# Patient Record
Sex: Female | Born: 1970
Health system: Southern US, Community
[De-identification: ages and names within clinical notes are randomized; demographics above are authoritative.]

## PROBLEM LIST (undated history)

## (undated) DIAGNOSIS — K219 Gastro-esophageal reflux disease without esophagitis: Secondary | ICD-10-CM

## (undated) DIAGNOSIS — G4730 Sleep apnea, unspecified: Secondary | ICD-10-CM

## (undated) DIAGNOSIS — D519 Vitamin B12 deficiency anemia, unspecified: Secondary | ICD-10-CM

## (undated) DIAGNOSIS — K5010 Crohn's disease of large intestine without complications: Secondary | ICD-10-CM

## (undated) DIAGNOSIS — K5090 Crohn's disease, unspecified, without complications: Secondary | ICD-10-CM

## (undated) DIAGNOSIS — E876 Hypokalemia: Secondary | ICD-10-CM

## (undated) DIAGNOSIS — F329 Major depressive disorder, single episode, unspecified: Secondary | ICD-10-CM

## (undated) DIAGNOSIS — K279 Peptic ulcer, site unspecified, unspecified as acute or chronic, without hemorrhage or perforation: Secondary | ICD-10-CM

## (undated) DIAGNOSIS — T7840XA Allergy, unspecified, initial encounter: Secondary | ICD-10-CM

## (undated) DIAGNOSIS — F319 Bipolar disorder, unspecified: Secondary | ICD-10-CM

## (undated) DIAGNOSIS — Z9889 Other specified postprocedural states: Secondary | ICD-10-CM

## (undated) DIAGNOSIS — F419 Anxiety disorder, unspecified: Secondary | ICD-10-CM

## (undated) DIAGNOSIS — S199XXA Unspecified injury of neck, initial encounter: Secondary | ICD-10-CM

## (undated) DIAGNOSIS — F32A Depression, unspecified: Secondary | ICD-10-CM

## (undated) DIAGNOSIS — M1990 Unspecified osteoarthritis, unspecified site: Secondary | ICD-10-CM

## (undated) DIAGNOSIS — D72829 Elevated white blood cell count, unspecified: Secondary | ICD-10-CM

## (undated) DIAGNOSIS — D509 Iron deficiency anemia, unspecified: Secondary | ICD-10-CM

## (undated) DIAGNOSIS — M545 Low back pain, unspecified: Secondary | ICD-10-CM

## (undated) HISTORY — DX: Gastro-esophageal reflux disease without esophagitis: K21.9

## (undated) HISTORY — PX: BREAST BIOPSY: SHX20

## (undated) HISTORY — DX: Elevated white blood cell count, unspecified: D72.829

## (undated) HISTORY — DX: Vitamin B12 deficiency anemia, unspecified: D51.9

## (undated) HISTORY — DX: Crohn's disease of large intestine without complications: K50.10

## (undated) HISTORY — DX: Hypokalemia: E87.6

## (undated) HISTORY — DX: Unspecified injury of neck, initial encounter: S19.9XXA

## (undated) HISTORY — DX: Peptic ulcer, site unspecified, unspecified as acute or chronic, without hemorrhage or perforation: K27.9

## (undated) HISTORY — DX: Low back pain, unspecified: M54.50

## (undated) HISTORY — DX: Bipolar disorder, unspecified: F31.9

## (undated) HISTORY — DX: Allergy, unspecified, initial encounter: T78.40XA

## (undated) HISTORY — DX: Depression, unspecified: F32.A

## (undated) HISTORY — DX: Low back pain: M54.5

## (undated) HISTORY — DX: Unspecified osteoarthritis, unspecified site: M19.90

## (undated) HISTORY — DX: Anxiety disorder, unspecified: F41.9

## (undated) HISTORY — PX: NECK SURGERY: SHX720

## (undated) HISTORY — DX: Iron deficiency anemia, unspecified: D50.9

## (undated) HISTORY — DX: Crohn's disease, unspecified, without complications: K50.90

## (undated) HISTORY — DX: Major depressive disorder, single episode, unspecified: F32.9

---

## 1994-03-07 HISTORY — PX: HERNIA REPAIR: SHX51

## 1998-03-07 HISTORY — PX: TUBAL LIGATION: SHX77

## 1999-03-08 DIAGNOSIS — K5090 Crohn's disease, unspecified, without complications: Secondary | ICD-10-CM

## 1999-03-08 HISTORY — PX: SMALL INTESTINE SURGERY: SHX150

## 1999-03-08 HISTORY — DX: Crohn's disease, unspecified, without complications: K50.90

## 2000-03-07 HISTORY — PX: CHOLECYSTECTOMY: SHX55

## 2000-06-16 ENCOUNTER — Inpatient Hospital Stay (HOSPITAL_COMMUNITY): Admission: AD | Admit: 2000-06-16 | Discharge: 2000-06-24 | Payer: Self-pay | Admitting: Family Medicine

## 2000-06-16 ENCOUNTER — Encounter: Payer: Self-pay | Admitting: Family Medicine

## 2000-06-22 ENCOUNTER — Encounter (INDEPENDENT_AMBULATORY_CARE_PROVIDER_SITE_OTHER): Payer: Self-pay | Admitting: Internal Medicine

## 2000-06-28 ENCOUNTER — Encounter: Payer: Self-pay | Admitting: *Deleted

## 2000-06-28 ENCOUNTER — Inpatient Hospital Stay (HOSPITAL_COMMUNITY): Admission: EM | Admit: 2000-06-28 | Discharge: 2000-07-06 | Payer: Self-pay | Admitting: *Deleted

## 2001-05-08 ENCOUNTER — Encounter: Payer: Self-pay | Admitting: Internal Medicine

## 2001-05-08 ENCOUNTER — Emergency Department (HOSPITAL_COMMUNITY): Admission: EM | Admit: 2001-05-08 | Discharge: 2001-05-08 | Payer: Self-pay | Admitting: Internal Medicine

## 2001-05-14 ENCOUNTER — Inpatient Hospital Stay (HOSPITAL_COMMUNITY): Admission: EM | Admit: 2001-05-14 | Discharge: 2001-05-18 | Payer: Self-pay | Admitting: Emergency Medicine

## 2001-05-14 ENCOUNTER — Encounter (INDEPENDENT_AMBULATORY_CARE_PROVIDER_SITE_OTHER): Payer: Self-pay | Admitting: Internal Medicine

## 2001-05-15 ENCOUNTER — Encounter (INDEPENDENT_AMBULATORY_CARE_PROVIDER_SITE_OTHER): Payer: Self-pay | Admitting: Internal Medicine

## 2001-07-17 ENCOUNTER — Emergency Department (HOSPITAL_COMMUNITY): Admission: EM | Admit: 2001-07-17 | Discharge: 2001-07-17 | Payer: Self-pay | Admitting: Emergency Medicine

## 2001-09-04 ENCOUNTER — Ambulatory Visit (HOSPITAL_COMMUNITY): Admission: RE | Admit: 2001-09-04 | Discharge: 2001-09-04 | Payer: Self-pay | Admitting: Internal Medicine

## 2001-10-24 ENCOUNTER — Emergency Department (HOSPITAL_COMMUNITY): Admission: EM | Admit: 2001-10-24 | Discharge: 2001-10-24 | Payer: Self-pay | Admitting: Emergency Medicine

## 2002-01-04 ENCOUNTER — Ambulatory Visit (HOSPITAL_COMMUNITY): Admission: RE | Admit: 2002-01-04 | Discharge: 2002-01-04 | Payer: Self-pay | Admitting: Internal Medicine

## 2002-01-04 ENCOUNTER — Encounter: Payer: Self-pay | Admitting: Internal Medicine

## 2002-01-17 ENCOUNTER — Inpatient Hospital Stay (HOSPITAL_COMMUNITY): Admission: EM | Admit: 2002-01-17 | Discharge: 2002-01-23 | Payer: Self-pay | Admitting: Psychiatry

## 2002-01-18 ENCOUNTER — Encounter (HOSPITAL_COMMUNITY): Payer: Self-pay | Admitting: Psychiatry

## 2002-04-30 ENCOUNTER — Emergency Department (HOSPITAL_COMMUNITY): Admission: EM | Admit: 2002-04-30 | Discharge: 2002-05-01 | Payer: Self-pay | Admitting: Emergency Medicine

## 2002-05-15 ENCOUNTER — Emergency Department (HOSPITAL_COMMUNITY): Admission: EM | Admit: 2002-05-15 | Discharge: 2002-05-15 | Payer: Self-pay | Admitting: Emergency Medicine

## 2002-05-22 ENCOUNTER — Emergency Department (HOSPITAL_COMMUNITY): Admission: EM | Admit: 2002-05-22 | Discharge: 2002-05-23 | Payer: Self-pay | Admitting: *Deleted

## 2002-06-27 ENCOUNTER — Encounter (HOSPITAL_COMMUNITY): Admission: RE | Admit: 2002-06-27 | Discharge: 2002-07-27 | Payer: Self-pay | Admitting: Internal Medicine

## 2002-07-11 ENCOUNTER — Encounter: Payer: Self-pay | Admitting: Internal Medicine

## 2002-07-11 ENCOUNTER — Ambulatory Visit (HOSPITAL_COMMUNITY): Admission: RE | Admit: 2002-07-11 | Discharge: 2002-07-11 | Payer: Self-pay | Admitting: Internal Medicine

## 2002-07-30 ENCOUNTER — Encounter: Payer: Self-pay | Admitting: Family Medicine

## 2002-07-30 ENCOUNTER — Ambulatory Visit (HOSPITAL_COMMUNITY): Admission: RE | Admit: 2002-07-30 | Discharge: 2002-07-30 | Payer: Self-pay | Admitting: Family Medicine

## 2002-08-21 ENCOUNTER — Encounter (HOSPITAL_COMMUNITY): Admission: RE | Admit: 2002-08-21 | Discharge: 2002-09-20 | Payer: Self-pay | Admitting: Internal Medicine

## 2002-09-01 ENCOUNTER — Emergency Department (HOSPITAL_COMMUNITY): Admission: EM | Admit: 2002-09-01 | Discharge: 2002-09-02 | Payer: Self-pay | Admitting: *Deleted

## 2002-09-01 ENCOUNTER — Encounter: Payer: Self-pay | Admitting: *Deleted

## 2002-09-11 ENCOUNTER — Ambulatory Visit (HOSPITAL_COMMUNITY): Admission: RE | Admit: 2002-09-11 | Discharge: 2002-09-11 | Payer: Self-pay | Admitting: Internal Medicine

## 2002-09-11 ENCOUNTER — Encounter: Payer: Self-pay | Admitting: Internal Medicine

## 2002-09-20 ENCOUNTER — Ambulatory Visit (HOSPITAL_COMMUNITY): Admission: RE | Admit: 2002-09-20 | Discharge: 2002-09-20 | Payer: Self-pay | Admitting: Internal Medicine

## 2002-09-20 HISTORY — PX: COLONOSCOPY: SHX174

## 2003-01-05 ENCOUNTER — Inpatient Hospital Stay (HOSPITAL_COMMUNITY): Admission: AD | Admit: 2003-01-05 | Discharge: 2003-01-09 | Payer: Self-pay | Admitting: Psychiatry

## 2003-03-08 HISTORY — PX: NECK SURGERY: SHX720

## 2003-06-17 ENCOUNTER — Emergency Department (HOSPITAL_COMMUNITY): Admission: EM | Admit: 2003-06-17 | Discharge: 2003-06-17 | Payer: Self-pay | Admitting: Emergency Medicine

## 2003-08-21 ENCOUNTER — Emergency Department (HOSPITAL_COMMUNITY): Admission: EM | Admit: 2003-08-21 | Discharge: 2003-08-21 | Payer: Self-pay | Admitting: Emergency Medicine

## 2003-09-02 ENCOUNTER — Emergency Department (HOSPITAL_COMMUNITY): Admission: EM | Admit: 2003-09-02 | Discharge: 2003-09-02 | Payer: Self-pay | Admitting: Emergency Medicine

## 2003-09-23 ENCOUNTER — Emergency Department (HOSPITAL_COMMUNITY): Admission: EM | Admit: 2003-09-23 | Discharge: 2003-09-23 | Payer: Self-pay | Admitting: Emergency Medicine

## 2003-10-03 ENCOUNTER — Emergency Department (HOSPITAL_COMMUNITY): Admission: EM | Admit: 2003-10-03 | Discharge: 2003-10-03 | Payer: Self-pay | Admitting: Emergency Medicine

## 2003-10-20 ENCOUNTER — Ambulatory Visit (HOSPITAL_COMMUNITY): Admission: RE | Admit: 2003-10-20 | Discharge: 2003-10-20 | Payer: Self-pay | Admitting: Family Medicine

## 2003-10-31 ENCOUNTER — Inpatient Hospital Stay (HOSPITAL_COMMUNITY): Admission: RE | Admit: 2003-10-31 | Discharge: 2003-11-02 | Payer: Self-pay | Admitting: Neurosurgery

## 2003-11-07 ENCOUNTER — Emergency Department (HOSPITAL_COMMUNITY): Admission: EM | Admit: 2003-11-07 | Discharge: 2003-11-07 | Payer: Self-pay | Admitting: Emergency Medicine

## 2003-11-09 ENCOUNTER — Inpatient Hospital Stay (HOSPITAL_COMMUNITY): Admission: EM | Admit: 2003-11-09 | Discharge: 2003-11-12 | Payer: Self-pay | Admitting: Emergency Medicine

## 2003-11-25 ENCOUNTER — Emergency Department (HOSPITAL_COMMUNITY): Admission: EM | Admit: 2003-11-25 | Discharge: 2003-11-25 | Payer: Self-pay | Admitting: Emergency Medicine

## 2003-12-01 ENCOUNTER — Emergency Department (HOSPITAL_COMMUNITY): Admission: EM | Admit: 2003-12-01 | Discharge: 2003-12-01 | Payer: Self-pay | Admitting: Emergency Medicine

## 2003-12-03 ENCOUNTER — Emergency Department (HOSPITAL_COMMUNITY): Admission: EM | Admit: 2003-12-03 | Discharge: 2003-12-03 | Payer: Self-pay | Admitting: Emergency Medicine

## 2003-12-04 ENCOUNTER — Ambulatory Visit: Payer: Self-pay | Admitting: Psychiatry

## 2003-12-10 ENCOUNTER — Emergency Department (HOSPITAL_COMMUNITY): Admission: EM | Admit: 2003-12-10 | Discharge: 2003-12-10 | Payer: Self-pay | Admitting: Emergency Medicine

## 2003-12-15 ENCOUNTER — Ambulatory Visit: Payer: Self-pay | Admitting: Psychology

## 2003-12-20 ENCOUNTER — Emergency Department (HOSPITAL_COMMUNITY): Admission: EM | Admit: 2003-12-20 | Discharge: 2003-12-20 | Payer: Self-pay | Admitting: Emergency Medicine

## 2004-01-02 ENCOUNTER — Ambulatory Visit (HOSPITAL_COMMUNITY): Admission: RE | Admit: 2004-01-02 | Discharge: 2004-01-02 | Payer: Self-pay | Admitting: Neurosurgery

## 2004-01-04 ENCOUNTER — Emergency Department (HOSPITAL_COMMUNITY): Admission: EM | Admit: 2004-01-04 | Discharge: 2004-01-04 | Payer: Self-pay | Admitting: Emergency Medicine

## 2004-01-15 ENCOUNTER — Ambulatory Visit: Payer: Self-pay | Admitting: Orthopedic Surgery

## 2004-01-26 ENCOUNTER — Encounter (HOSPITAL_COMMUNITY): Admission: RE | Admit: 2004-01-26 | Discharge: 2004-02-25 | Payer: Self-pay | Admitting: Orthopedic Surgery

## 2004-02-05 ENCOUNTER — Ambulatory Visit: Payer: Self-pay | Admitting: Psychiatry

## 2004-03-05 ENCOUNTER — Emergency Department (HOSPITAL_COMMUNITY): Admission: EM | Admit: 2004-03-05 | Discharge: 2004-03-05 | Payer: Self-pay | Admitting: Emergency Medicine

## 2004-03-07 HISTORY — PX: SHOULDER SURGERY: SHX246

## 2004-03-09 ENCOUNTER — Ambulatory Visit: Payer: Self-pay | Admitting: Internal Medicine

## 2004-03-27 ENCOUNTER — Emergency Department (HOSPITAL_COMMUNITY): Admission: EM | Admit: 2004-03-27 | Discharge: 2004-03-27 | Payer: Self-pay | Admitting: Emergency Medicine

## 2004-03-29 ENCOUNTER — Ambulatory Visit: Payer: Self-pay | Admitting: Orthopedic Surgery

## 2004-04-01 ENCOUNTER — Ambulatory Visit: Payer: Self-pay | Admitting: Psychiatry

## 2004-04-02 ENCOUNTER — Ambulatory Visit: Payer: Self-pay | Admitting: Orthopedic Surgery

## 2004-04-02 ENCOUNTER — Ambulatory Visit (HOSPITAL_COMMUNITY): Admission: RE | Admit: 2004-04-02 | Discharge: 2004-04-02 | Payer: Self-pay | Admitting: Orthopedic Surgery

## 2004-04-05 ENCOUNTER — Ambulatory Visit: Payer: Self-pay | Admitting: Orthopedic Surgery

## 2004-04-07 ENCOUNTER — Emergency Department (HOSPITAL_COMMUNITY): Admission: EM | Admit: 2004-04-07 | Discharge: 2004-04-07 | Payer: Self-pay | Admitting: Emergency Medicine

## 2004-04-26 ENCOUNTER — Ambulatory Visit: Payer: Self-pay | Admitting: Orthopedic Surgery

## 2004-06-02 ENCOUNTER — Ambulatory Visit: Payer: Self-pay | Admitting: Psychiatry

## 2004-06-28 ENCOUNTER — Ambulatory Visit: Payer: Self-pay | Admitting: Orthopedic Surgery

## 2004-07-22 ENCOUNTER — Emergency Department (HOSPITAL_COMMUNITY): Admission: EM | Admit: 2004-07-22 | Discharge: 2004-07-22 | Payer: Self-pay | Admitting: Emergency Medicine

## 2004-08-02 ENCOUNTER — Emergency Department (HOSPITAL_COMMUNITY): Admission: EM | Admit: 2004-08-02 | Discharge: 2004-08-02 | Payer: Self-pay | Admitting: Emergency Medicine

## 2004-08-04 ENCOUNTER — Ambulatory Visit: Payer: Self-pay | Admitting: Internal Medicine

## 2004-08-11 ENCOUNTER — Emergency Department (HOSPITAL_COMMUNITY): Admission: EM | Admit: 2004-08-11 | Discharge: 2004-08-12 | Payer: Self-pay | Admitting: *Deleted

## 2004-08-25 ENCOUNTER — Ambulatory Visit: Payer: Self-pay | Admitting: Psychology

## 2004-09-15 ENCOUNTER — Ambulatory Visit: Payer: Self-pay | Admitting: Orthopedic Surgery

## 2004-09-16 ENCOUNTER — Encounter (HOSPITAL_COMMUNITY): Admission: RE | Admit: 2004-09-16 | Discharge: 2004-10-16 | Payer: Self-pay | Admitting: Orthopedic Surgery

## 2004-10-21 ENCOUNTER — Emergency Department (HOSPITAL_COMMUNITY): Admission: EM | Admit: 2004-10-21 | Discharge: 2004-10-21 | Payer: Self-pay | Admitting: Emergency Medicine

## 2004-10-28 ENCOUNTER — Ambulatory Visit: Payer: Self-pay | Admitting: Psychology

## 2004-10-29 ENCOUNTER — Emergency Department (HOSPITAL_COMMUNITY): Admission: EM | Admit: 2004-10-29 | Discharge: 2004-10-30 | Payer: Self-pay | Admitting: *Deleted

## 2005-01-06 ENCOUNTER — Ambulatory Visit: Payer: Self-pay | Admitting: Psychiatry

## 2005-01-31 ENCOUNTER — Emergency Department (HOSPITAL_COMMUNITY): Admission: EM | Admit: 2005-01-31 | Discharge: 2005-01-31 | Payer: Self-pay | Admitting: Emergency Medicine

## 2005-02-03 ENCOUNTER — Ambulatory Visit: Payer: Self-pay | Admitting: Family Medicine

## 2005-02-03 ENCOUNTER — Ambulatory Visit: Payer: Self-pay | Admitting: Internal Medicine

## 2005-02-05 ENCOUNTER — Emergency Department (HOSPITAL_COMMUNITY): Admission: EM | Admit: 2005-02-05 | Discharge: 2005-02-05 | Payer: Self-pay | Admitting: Emergency Medicine

## 2005-02-07 ENCOUNTER — Ambulatory Visit: Payer: Self-pay | Admitting: Family Medicine

## 2005-03-08 ENCOUNTER — Ambulatory Visit: Payer: Self-pay | Admitting: Psychiatry

## 2005-03-28 ENCOUNTER — Emergency Department (HOSPITAL_COMMUNITY): Admission: EM | Admit: 2005-03-28 | Discharge: 2005-03-28 | Payer: Self-pay | Admitting: Emergency Medicine

## 2005-04-18 ENCOUNTER — Emergency Department (HOSPITAL_COMMUNITY): Admission: EM | Admit: 2005-04-18 | Discharge: 2005-04-18 | Payer: Self-pay | Admitting: Emergency Medicine

## 2005-05-01 ENCOUNTER — Emergency Department (HOSPITAL_COMMUNITY): Admission: EM | Admit: 2005-05-01 | Discharge: 2005-05-01 | Payer: Self-pay | Admitting: Emergency Medicine

## 2005-05-05 ENCOUNTER — Ambulatory Visit: Payer: Self-pay | Admitting: Psychiatry

## 2005-05-16 ENCOUNTER — Ambulatory Visit: Payer: Self-pay | Admitting: Family Medicine

## 2005-05-17 ENCOUNTER — Ambulatory Visit: Payer: Self-pay | Admitting: Internal Medicine

## 2005-05-19 ENCOUNTER — Ambulatory Visit (HOSPITAL_COMMUNITY): Payer: Self-pay | Admitting: Psychology

## 2005-05-20 ENCOUNTER — Emergency Department (HOSPITAL_COMMUNITY): Admission: EM | Admit: 2005-05-20 | Discharge: 2005-05-20 | Payer: Self-pay | Admitting: *Deleted

## 2005-05-20 ENCOUNTER — Ambulatory Visit: Payer: Self-pay | Admitting: Internal Medicine

## 2005-05-27 ENCOUNTER — Ambulatory Visit: Payer: Self-pay | Admitting: Internal Medicine

## 2005-05-31 ENCOUNTER — Ambulatory Visit: Payer: Self-pay | Admitting: Gastroenterology

## 2005-06-01 ENCOUNTER — Ambulatory Visit: Payer: Self-pay | Admitting: Gastroenterology

## 2005-06-01 ENCOUNTER — Encounter (INDEPENDENT_AMBULATORY_CARE_PROVIDER_SITE_OTHER): Payer: Self-pay | Admitting: Internal Medicine

## 2005-06-01 ENCOUNTER — Encounter (INDEPENDENT_AMBULATORY_CARE_PROVIDER_SITE_OTHER): Payer: Self-pay | Admitting: Specialist

## 2005-06-01 DIAGNOSIS — Z9889 Other specified postprocedural states: Secondary | ICD-10-CM

## 2005-06-01 HISTORY — DX: Other specified postprocedural states: Z98.890

## 2005-06-03 ENCOUNTER — Ambulatory Visit (HOSPITAL_COMMUNITY): Admission: RE | Admit: 2005-06-03 | Discharge: 2005-06-03 | Payer: Self-pay | Admitting: Gastroenterology

## 2005-06-06 ENCOUNTER — Ambulatory Visit: Payer: Self-pay | Admitting: Internal Medicine

## 2005-06-08 ENCOUNTER — Ambulatory Visit (HOSPITAL_COMMUNITY): Admission: RE | Admit: 2005-06-08 | Discharge: 2005-06-08 | Payer: Self-pay | Admitting: Internal Medicine

## 2005-06-09 ENCOUNTER — Ambulatory Visit: Payer: Self-pay | Admitting: Family Medicine

## 2005-06-09 ENCOUNTER — Ambulatory Visit: Payer: Self-pay | Admitting: Gastroenterology

## 2005-06-09 ENCOUNTER — Encounter (INDEPENDENT_AMBULATORY_CARE_PROVIDER_SITE_OTHER): Payer: Self-pay | Admitting: Internal Medicine

## 2005-06-13 ENCOUNTER — Encounter (INDEPENDENT_AMBULATORY_CARE_PROVIDER_SITE_OTHER): Payer: Self-pay | Admitting: Internal Medicine

## 2005-06-13 ENCOUNTER — Ambulatory Visit: Payer: Self-pay | Admitting: Family Medicine

## 2005-06-16 ENCOUNTER — Ambulatory Visit: Payer: Self-pay | Admitting: Internal Medicine

## 2005-06-18 ENCOUNTER — Ambulatory Visit: Payer: Self-pay | Admitting: Gastroenterology

## 2005-06-18 ENCOUNTER — Observation Stay (HOSPITAL_COMMUNITY): Admission: EM | Admit: 2005-06-18 | Discharge: 2005-06-19 | Payer: Self-pay | Admitting: Emergency Medicine

## 2005-06-23 ENCOUNTER — Ambulatory Visit: Payer: Self-pay | Admitting: Internal Medicine

## 2005-06-30 ENCOUNTER — Ambulatory Visit: Payer: Self-pay | Admitting: Orthopedic Surgery

## 2005-07-05 ENCOUNTER — Ambulatory Visit (HOSPITAL_COMMUNITY): Payer: Self-pay | Admitting: Psychiatry

## 2005-07-20 ENCOUNTER — Ambulatory Visit: Payer: Self-pay | Admitting: Internal Medicine

## 2005-07-27 ENCOUNTER — Ambulatory Visit: Payer: Self-pay | Admitting: Internal Medicine

## 2005-08-06 ENCOUNTER — Emergency Department (HOSPITAL_COMMUNITY): Admission: EM | Admit: 2005-08-06 | Discharge: 2005-08-06 | Payer: Self-pay | Admitting: Emergency Medicine

## 2005-08-09 ENCOUNTER — Ambulatory Visit (HOSPITAL_COMMUNITY): Payer: Self-pay | Admitting: Psychology

## 2005-08-17 ENCOUNTER — Ambulatory Visit: Payer: Self-pay | Admitting: Internal Medicine

## 2005-08-26 ENCOUNTER — Ambulatory Visit: Payer: Self-pay | Admitting: Gastroenterology

## 2005-09-06 ENCOUNTER — Ambulatory Visit: Payer: Self-pay | Admitting: Gastroenterology

## 2005-09-20 ENCOUNTER — Ambulatory Visit (HOSPITAL_COMMUNITY): Payer: Self-pay | Admitting: Psychiatry

## 2005-09-21 ENCOUNTER — Ambulatory Visit: Payer: Self-pay | Admitting: Internal Medicine

## 2005-09-21 LAB — CONVERTED CEMR LAB
ALT: 26 units/L
AST: 19 units/L
Calcium: 9.1 mg/dL
Chloride: 109 meq/L
Creatinine, Ser: 0.79 mg/dL
Sodium: 140 meq/L
Total Protein: 7.3 g/dL

## 2005-10-03 ENCOUNTER — Ambulatory Visit (HOSPITAL_COMMUNITY): Payer: Self-pay | Admitting: Psychology

## 2005-10-05 ENCOUNTER — Ambulatory Visit: Payer: Self-pay | Admitting: Internal Medicine

## 2005-10-06 ENCOUNTER — Ambulatory Visit: Payer: Self-pay | Admitting: Internal Medicine

## 2005-10-20 ENCOUNTER — Ambulatory Visit: Payer: Self-pay | Admitting: Internal Medicine

## 2005-10-20 ENCOUNTER — Ambulatory Visit (HOSPITAL_COMMUNITY): Payer: Self-pay | Admitting: Psychiatry

## 2005-11-03 ENCOUNTER — Ambulatory Visit: Payer: Self-pay | Admitting: Internal Medicine

## 2005-11-10 ENCOUNTER — Ambulatory Visit: Payer: Self-pay | Admitting: Internal Medicine

## 2005-11-17 ENCOUNTER — Ambulatory Visit: Payer: Self-pay | Admitting: Internal Medicine

## 2005-12-01 ENCOUNTER — Ambulatory Visit: Payer: Self-pay | Admitting: Internal Medicine

## 2005-12-20 ENCOUNTER — Ambulatory Visit (HOSPITAL_COMMUNITY): Payer: Self-pay | Admitting: Psychiatry

## 2005-12-29 DIAGNOSIS — Z9889 Other specified postprocedural states: Secondary | ICD-10-CM | POA: Insufficient documentation

## 2005-12-29 DIAGNOSIS — M545 Low back pain, unspecified: Secondary | ICD-10-CM | POA: Insufficient documentation

## 2005-12-29 DIAGNOSIS — F411 Generalized anxiety disorder: Secondary | ICD-10-CM | POA: Insufficient documentation

## 2005-12-29 DIAGNOSIS — M129 Arthropathy, unspecified: Secondary | ICD-10-CM | POA: Insufficient documentation

## 2005-12-29 DIAGNOSIS — J45909 Unspecified asthma, uncomplicated: Secondary | ICD-10-CM | POA: Insufficient documentation

## 2005-12-29 DIAGNOSIS — K279 Peptic ulcer, site unspecified, unspecified as acute or chronic, without hemorrhage or perforation: Secondary | ICD-10-CM | POA: Insufficient documentation

## 2005-12-29 DIAGNOSIS — D518 Other vitamin B12 deficiency anemias: Secondary | ICD-10-CM | POA: Insufficient documentation

## 2005-12-29 DIAGNOSIS — J309 Allergic rhinitis, unspecified: Secondary | ICD-10-CM | POA: Insufficient documentation

## 2005-12-29 DIAGNOSIS — F319 Bipolar disorder, unspecified: Secondary | ICD-10-CM | POA: Insufficient documentation

## 2005-12-29 DIAGNOSIS — K219 Gastro-esophageal reflux disease without esophagitis: Secondary | ICD-10-CM | POA: Insufficient documentation

## 2005-12-29 DIAGNOSIS — F324 Major depressive disorder, single episode, in partial remission: Secondary | ICD-10-CM | POA: Insufficient documentation

## 2006-01-03 ENCOUNTER — Ambulatory Visit: Payer: Self-pay | Admitting: Internal Medicine

## 2006-01-03 LAB — CONVERTED CEMR LAB: Vitamin B-12: 557 pg/mL

## 2006-01-05 ENCOUNTER — Encounter (INDEPENDENT_AMBULATORY_CARE_PROVIDER_SITE_OTHER): Payer: Self-pay | Admitting: Internal Medicine

## 2006-01-12 ENCOUNTER — Ambulatory Visit: Payer: Self-pay | Admitting: Internal Medicine

## 2006-02-14 ENCOUNTER — Ambulatory Visit: Payer: Self-pay | Admitting: Internal Medicine

## 2006-02-15 ENCOUNTER — Ambulatory Visit (HOSPITAL_COMMUNITY): Payer: Self-pay | Admitting: Psychology

## 2006-02-16 ENCOUNTER — Ambulatory Visit (HOSPITAL_COMMUNITY): Payer: Self-pay | Admitting: Psychiatry

## 2006-03-22 ENCOUNTER — Ambulatory Visit: Payer: Self-pay | Admitting: Internal Medicine

## 2006-03-22 LAB — CONVERTED CEMR LAB
ALT: 30 units/L (ref 0–35)
AST: 18 units/L (ref 0–37)
Alkaline Phosphatase: 70 units/L (ref 39–117)
Bilirubin, Direct: 0.1 mg/dL (ref 0.0–0.3)
Eosinophils Relative: 4 % (ref 0–5)
HCT: 41.8 % (ref 36.0–46.0)
Hemoglobin: 13.7 g/dL (ref 12.0–15.0)
Lymphocytes Relative: 36 % (ref 12–46)
Lymphs Abs: 4.4 10*3/uL — ABNORMAL HIGH (ref 0.7–3.3)
Neutro Abs: 6.7 10*3/uL (ref 1.7–7.7)
Platelets: 288 10*3/uL (ref 150–400)
RDW: 15 % — ABNORMAL HIGH (ref 11.5–14.0)
Total Bilirubin: 0.3 mg/dL (ref 0.3–1.2)
WBC: 12.2 10*3/uL — ABNORMAL HIGH (ref 4.0–10.5)

## 2006-03-29 ENCOUNTER — Ambulatory Visit: Payer: Self-pay | Admitting: Internal Medicine

## 2006-03-30 ENCOUNTER — Encounter (INDEPENDENT_AMBULATORY_CARE_PROVIDER_SITE_OTHER): Payer: Self-pay | Admitting: Internal Medicine

## 2006-03-30 LAB — CONVERTED CEMR LAB
Basophils Absolute: 0.1 10*3/uL (ref 0.0–0.1)
Basophils Relative: 0 % (ref 0–1)
Calcium: 8.7 mg/dL (ref 8.4–10.5)
Eosinophils Absolute: 0.5 10*3/uL (ref 0.0–0.7)
Eosinophils Relative: 4 % (ref 0–5)
Glucose, Bld: 77 mg/dL (ref 70–99)
HCT: 39.7 % (ref 36.0–46.0)
Lymphs Abs: 5.2 10*3/uL — ABNORMAL HIGH (ref 0.7–3.3)
MCHC: 34.3 g/dL (ref 30.0–36.0)
MCV: 82 fL (ref 78.0–100.0)
Neutrophils Relative %: 51 % (ref 43–77)
Platelets: 308 10*3/uL (ref 150–400)
Potassium: 3.2 meq/L — ABNORMAL LOW (ref 3.5–5.3)
RDW: 14.8 % — ABNORMAL HIGH (ref 11.5–14.0)
Sodium: 143 meq/L (ref 135–145)

## 2006-04-07 ENCOUNTER — Encounter (INDEPENDENT_AMBULATORY_CARE_PROVIDER_SITE_OTHER): Payer: Self-pay | Admitting: Internal Medicine

## 2006-04-13 ENCOUNTER — Ambulatory Visit: Payer: Self-pay | Admitting: Internal Medicine

## 2006-05-02 ENCOUNTER — Ambulatory Visit (HOSPITAL_COMMUNITY): Payer: Self-pay | Admitting: Psychiatry

## 2006-05-02 ENCOUNTER — Telehealth (INDEPENDENT_AMBULATORY_CARE_PROVIDER_SITE_OTHER): Payer: Self-pay | Admitting: *Deleted

## 2006-05-09 ENCOUNTER — Ambulatory Visit: Payer: Self-pay | Admitting: Gastroenterology

## 2006-05-11 ENCOUNTER — Ambulatory Visit (HOSPITAL_COMMUNITY): Payer: Self-pay | Admitting: Psychology

## 2006-05-15 ENCOUNTER — Ambulatory Visit: Payer: Self-pay | Admitting: Internal Medicine

## 2006-05-15 ENCOUNTER — Telehealth (INDEPENDENT_AMBULATORY_CARE_PROVIDER_SITE_OTHER): Payer: Self-pay | Admitting: *Deleted

## 2006-05-15 DIAGNOSIS — K5010 Crohn's disease of large intestine without complications: Secondary | ICD-10-CM

## 2006-05-15 DIAGNOSIS — K50112 Crohn's disease of large intestine with intestinal obstruction: Secondary | ICD-10-CM | POA: Insufficient documentation

## 2006-05-15 HISTORY — DX: Crohn's disease of large intestine without complications: K50.10

## 2006-05-18 ENCOUNTER — Ambulatory Visit: Payer: Self-pay | Admitting: Internal Medicine

## 2006-05-29 ENCOUNTER — Ambulatory Visit: Payer: Self-pay | Admitting: Internal Medicine

## 2006-05-30 ENCOUNTER — Encounter (INDEPENDENT_AMBULATORY_CARE_PROVIDER_SITE_OTHER): Payer: Self-pay | Admitting: Internal Medicine

## 2006-06-08 ENCOUNTER — Encounter (INDEPENDENT_AMBULATORY_CARE_PROVIDER_SITE_OTHER): Payer: Self-pay | Admitting: Internal Medicine

## 2006-06-09 ENCOUNTER — Ambulatory Visit (HOSPITAL_COMMUNITY): Payer: Self-pay | Admitting: Psychology

## 2006-06-20 LAB — CONVERTED CEMR LAB
BUN: 9 mg/dL (ref 6–23)
Chloride: 113 meq/L — ABNORMAL HIGH (ref 96–112)
Creatinine, Ser: 0.71 mg/dL (ref 0.40–1.20)
Glucose, Bld: 82 mg/dL (ref 70–99)
Potassium: 3.5 meq/L (ref 3.5–5.3)

## 2006-06-22 ENCOUNTER — Ambulatory Visit: Payer: Self-pay | Admitting: Internal Medicine

## 2006-06-26 LAB — CONVERTED CEMR LAB
Albumin: 3.8 g/dL (ref 3.5–5.2)
Basophils Relative: 0 % (ref 0–1)
Eosinophils Absolute: 0.5 10*3/uL (ref 0.0–0.7)
MCHC: 33.2 g/dL (ref 30.0–36.0)
MCV: 85.5 fL (ref 78.0–100.0)
Neutro Abs: 5.7 10*3/uL (ref 1.7–7.7)
Neutrophils Relative %: 42 % — ABNORMAL LOW (ref 43–77)
Platelets: 287 10*3/uL (ref 150–400)
Total Bilirubin: 0.3 mg/dL (ref 0.3–1.2)
WBC: 13.7 10*3/uL — ABNORMAL HIGH (ref 4.0–10.5)

## 2006-06-28 ENCOUNTER — Encounter (INDEPENDENT_AMBULATORY_CARE_PROVIDER_SITE_OTHER): Payer: Self-pay | Admitting: Internal Medicine

## 2006-06-29 ENCOUNTER — Telehealth (INDEPENDENT_AMBULATORY_CARE_PROVIDER_SITE_OTHER): Payer: Self-pay | Admitting: Internal Medicine

## 2006-07-10 ENCOUNTER — Encounter (INDEPENDENT_AMBULATORY_CARE_PROVIDER_SITE_OTHER): Payer: Self-pay | Admitting: Internal Medicine

## 2006-07-18 ENCOUNTER — Ambulatory Visit: Payer: Self-pay | Admitting: Internal Medicine

## 2006-07-18 ENCOUNTER — Encounter (INDEPENDENT_AMBULATORY_CARE_PROVIDER_SITE_OTHER): Payer: Self-pay | Admitting: Internal Medicine

## 2006-07-18 ENCOUNTER — Encounter: Payer: Self-pay | Admitting: Internal Medicine

## 2006-07-27 ENCOUNTER — Ambulatory Visit (HOSPITAL_COMMUNITY): Payer: Self-pay | Admitting: Psychiatry

## 2006-08-22 ENCOUNTER — Ambulatory Visit: Payer: Self-pay | Admitting: Internal Medicine

## 2006-09-22 ENCOUNTER — Ambulatory Visit: Payer: Self-pay | Admitting: Internal Medicine

## 2006-10-10 ENCOUNTER — Encounter (INDEPENDENT_AMBULATORY_CARE_PROVIDER_SITE_OTHER): Payer: Self-pay | Admitting: Internal Medicine

## 2006-10-19 ENCOUNTER — Ambulatory Visit (HOSPITAL_COMMUNITY): Payer: Self-pay | Admitting: Psychiatry

## 2006-10-23 ENCOUNTER — Ambulatory Visit (HOSPITAL_COMMUNITY): Payer: Self-pay | Admitting: Psychology

## 2006-10-24 ENCOUNTER — Ambulatory Visit: Payer: Self-pay | Admitting: Internal Medicine

## 2006-10-24 LAB — CONVERTED CEMR LAB
AST: 17 units/L (ref 0–37)
Albumin: 4.1 g/dL (ref 3.5–5.2)
BUN: 6 mg/dL (ref 6–23)
Basophils Absolute: 0 10*3/uL (ref 0.0–0.1)
Beta hcg, urine, semiquantitative: NEGATIVE
Calcium: 9.5 mg/dL (ref 8.4–10.5)
Chloride: 110 meq/L (ref 96–112)
Hemoglobin: 14.2 g/dL (ref 12.0–15.0)
Lymphocytes Relative: 44 % (ref 12–46)
Lymphs Abs: 6.4 10*3/uL — ABNORMAL HIGH (ref 0.7–3.3)
Monocytes Absolute: 0.9 10*3/uL — ABNORMAL HIGH (ref 0.2–0.7)
Monocytes Relative: 6 % (ref 3–11)
Neutro Abs: 6.9 10*3/uL (ref 1.7–7.7)
Potassium: 4.7 meq/L (ref 3.5–5.3)
RBC: 5.05 M/uL (ref 3.87–5.11)
WBC: 14.5 10*3/uL — ABNORMAL HIGH (ref 4.0–10.5)

## 2006-10-25 ENCOUNTER — Telehealth (INDEPENDENT_AMBULATORY_CARE_PROVIDER_SITE_OTHER): Payer: Self-pay | Admitting: *Deleted

## 2006-11-21 ENCOUNTER — Ambulatory Visit: Payer: Self-pay | Admitting: Internal Medicine

## 2006-12-25 ENCOUNTER — Ambulatory Visit: Payer: Self-pay | Admitting: Internal Medicine

## 2007-01-05 ENCOUNTER — Ambulatory Visit: Payer: Self-pay | Admitting: Internal Medicine

## 2007-01-16 ENCOUNTER — Ambulatory Visit (HOSPITAL_COMMUNITY): Payer: Self-pay | Admitting: Psychiatry

## 2007-01-22 ENCOUNTER — Ambulatory Visit: Payer: Self-pay | Admitting: Internal Medicine

## 2007-01-23 ENCOUNTER — Encounter (INDEPENDENT_AMBULATORY_CARE_PROVIDER_SITE_OTHER): Payer: Self-pay | Admitting: Internal Medicine

## 2007-01-23 ENCOUNTER — Telehealth (INDEPENDENT_AMBULATORY_CARE_PROVIDER_SITE_OTHER): Payer: Self-pay | Admitting: *Deleted

## 2007-01-23 LAB — CONVERTED CEMR LAB
ALT: 23 units/L (ref 0–35)
AST: 19 units/L (ref 0–37)
Basophils Relative: 0 % (ref 0–1)
Calcium: 9.2 mg/dL (ref 8.4–10.5)
Chloride: 108 meq/L (ref 96–112)
Creatinine, Ser: 0.79 mg/dL (ref 0.40–1.20)
Eosinophils Absolute: 0.3 10*3/uL (ref 0.2–0.7)
Eosinophils Relative: 2 % (ref 0–5)
HCT: 41.3 % (ref 36.0–46.0)
Hemoglobin: 14 g/dL (ref 12.0–15.0)
MCHC: 33.9 g/dL (ref 30.0–36.0)
MCV: 83.8 fL (ref 78.0–100.0)
Monocytes Absolute: 0.7 10*3/uL (ref 0.1–1.0)
Monocytes Relative: 5 % (ref 3–12)
RBC: 4.93 M/uL (ref 3.87–5.11)
Total Bilirubin: 0.3 mg/dL (ref 0.3–1.2)

## 2007-01-25 ENCOUNTER — Ambulatory Visit (HOSPITAL_COMMUNITY): Payer: Self-pay | Admitting: Psychology

## 2007-02-09 ENCOUNTER — Ambulatory Visit: Payer: Self-pay | Admitting: Internal Medicine

## 2007-02-13 ENCOUNTER — Ambulatory Visit: Payer: Self-pay | Admitting: Internal Medicine

## 2007-02-13 ENCOUNTER — Telehealth (INDEPENDENT_AMBULATORY_CARE_PROVIDER_SITE_OTHER): Payer: Self-pay | Admitting: Internal Medicine

## 2007-02-13 DIAGNOSIS — R7309 Other abnormal glucose: Secondary | ICD-10-CM | POA: Insufficient documentation

## 2007-02-26 ENCOUNTER — Ambulatory Visit: Payer: Self-pay | Admitting: Internal Medicine

## 2007-03-06 ENCOUNTER — Ambulatory Visit: Payer: Self-pay | Admitting: Internal Medicine

## 2007-03-06 DIAGNOSIS — H05019 Cellulitis of unspecified orbit: Secondary | ICD-10-CM | POA: Insufficient documentation

## 2007-03-07 ENCOUNTER — Ambulatory Visit: Payer: Self-pay | Admitting: Internal Medicine

## 2007-03-08 ENCOUNTER — Encounter: Payer: Self-pay | Admitting: Family Medicine

## 2007-03-08 DIAGNOSIS — K279 Peptic ulcer, site unspecified, unspecified as acute or chronic, without hemorrhage or perforation: Secondary | ICD-10-CM

## 2007-03-08 HISTORY — DX: Peptic ulcer, site unspecified, unspecified as acute or chronic, without hemorrhage or perforation: K27.9

## 2007-03-09 ENCOUNTER — Telehealth (INDEPENDENT_AMBULATORY_CARE_PROVIDER_SITE_OTHER): Payer: Self-pay | Admitting: *Deleted

## 2007-03-09 ENCOUNTER — Encounter (INDEPENDENT_AMBULATORY_CARE_PROVIDER_SITE_OTHER): Payer: Self-pay | Admitting: Internal Medicine

## 2007-03-14 ENCOUNTER — Ambulatory Visit: Payer: Self-pay | Admitting: Internal Medicine

## 2007-03-23 ENCOUNTER — Ambulatory Visit (HOSPITAL_COMMUNITY): Admission: RE | Admit: 2007-03-23 | Discharge: 2007-03-23 | Payer: Self-pay | Admitting: General Surgery

## 2007-03-29 ENCOUNTER — Ambulatory Visit: Payer: Self-pay | Admitting: Internal Medicine

## 2007-04-17 ENCOUNTER — Ambulatory Visit (HOSPITAL_COMMUNITY): Payer: Self-pay | Admitting: Psychiatry

## 2007-04-19 ENCOUNTER — Encounter (INDEPENDENT_AMBULATORY_CARE_PROVIDER_SITE_OTHER): Payer: Self-pay | Admitting: Internal Medicine

## 2007-04-27 ENCOUNTER — Ambulatory Visit: Payer: Self-pay | Admitting: Internal Medicine

## 2007-04-28 ENCOUNTER — Telehealth (INDEPENDENT_AMBULATORY_CARE_PROVIDER_SITE_OTHER): Payer: Self-pay | Admitting: Internal Medicine

## 2007-04-28 ENCOUNTER — Encounter (INDEPENDENT_AMBULATORY_CARE_PROVIDER_SITE_OTHER): Payer: Self-pay | Admitting: Internal Medicine

## 2007-04-30 ENCOUNTER — Encounter (INDEPENDENT_AMBULATORY_CARE_PROVIDER_SITE_OTHER): Payer: Self-pay | Admitting: Internal Medicine

## 2007-05-01 ENCOUNTER — Encounter (INDEPENDENT_AMBULATORY_CARE_PROVIDER_SITE_OTHER): Payer: Self-pay | Admitting: Internal Medicine

## 2007-05-25 ENCOUNTER — Ambulatory Visit: Payer: Self-pay | Admitting: Internal Medicine

## 2007-05-26 LAB — CONVERTED CEMR LAB
AST: 27 units/L (ref 0–37)
Albumin: 4 g/dL (ref 3.5–5.2)
Alkaline Phosphatase: 83 units/L (ref 39–117)
BUN: 6 mg/dL (ref 6–23)
Basophils Relative: 1 % (ref 0–1)
CO2: 16 meq/L — ABNORMAL LOW (ref 19–32)
Creatinine, Ser: 0.83 mg/dL (ref 0.40–1.20)
Eosinophils Absolute: 0.5 10*3/uL (ref 0.0–0.7)
Eosinophils Relative: 4 % (ref 0–5)
MCHC: 33.6 g/dL (ref 30.0–36.0)
MCV: 85.3 fL (ref 78.0–100.0)
Neutrophils Relative %: 50 % (ref 43–77)
Platelets: 275 10*3/uL (ref 150–400)
Potassium: 3.5 meq/L (ref 3.5–5.3)
Sodium: 142 meq/L (ref 135–145)
Total Bilirubin: 0.4 mg/dL (ref 0.3–1.2)
Total Protein: 7.3 g/dL (ref 6.0–8.3)
WBC: 14 10*3/uL — ABNORMAL HIGH (ref 4.0–10.5)

## 2007-05-28 ENCOUNTER — Telehealth (INDEPENDENT_AMBULATORY_CARE_PROVIDER_SITE_OTHER): Payer: Self-pay | Admitting: *Deleted

## 2007-06-07 ENCOUNTER — Telehealth (INDEPENDENT_AMBULATORY_CARE_PROVIDER_SITE_OTHER): Payer: Self-pay | Admitting: Internal Medicine

## 2007-06-22 ENCOUNTER — Ambulatory Visit: Payer: Self-pay | Admitting: Internal Medicine

## 2007-07-10 ENCOUNTER — Telehealth (INDEPENDENT_AMBULATORY_CARE_PROVIDER_SITE_OTHER): Payer: Self-pay | Admitting: Internal Medicine

## 2007-07-10 ENCOUNTER — Encounter (INDEPENDENT_AMBULATORY_CARE_PROVIDER_SITE_OTHER): Payer: Self-pay | Admitting: Internal Medicine

## 2007-07-10 ENCOUNTER — Telehealth: Payer: Self-pay | Admitting: Gastroenterology

## 2007-07-12 ENCOUNTER — Ambulatory Visit (HOSPITAL_COMMUNITY): Payer: Self-pay | Admitting: Psychiatry

## 2007-07-12 ENCOUNTER — Inpatient Hospital Stay (HOSPITAL_COMMUNITY): Admission: AD | Admit: 2007-07-12 | Discharge: 2007-07-14 | Payer: Self-pay | Admitting: *Deleted

## 2007-07-12 ENCOUNTER — Encounter: Payer: Self-pay | Admitting: Emergency Medicine

## 2007-07-12 ENCOUNTER — Ambulatory Visit: Payer: Self-pay | Admitting: *Deleted

## 2007-07-12 ENCOUNTER — Telehealth (INDEPENDENT_AMBULATORY_CARE_PROVIDER_SITE_OTHER): Payer: Self-pay | Admitting: Internal Medicine

## 2007-07-17 ENCOUNTER — Ambulatory Visit: Payer: Self-pay | Admitting: Internal Medicine

## 2007-07-17 DIAGNOSIS — G44209 Tension-type headache, unspecified, not intractable: Secondary | ICD-10-CM | POA: Insufficient documentation

## 2007-07-18 ENCOUNTER — Telehealth (INDEPENDENT_AMBULATORY_CARE_PROVIDER_SITE_OTHER): Payer: Self-pay | Admitting: Internal Medicine

## 2007-07-18 ENCOUNTER — Encounter (INDEPENDENT_AMBULATORY_CARE_PROVIDER_SITE_OTHER): Payer: Self-pay | Admitting: Internal Medicine

## 2007-07-18 ENCOUNTER — Ambulatory Visit (HOSPITAL_COMMUNITY): Payer: Self-pay | Admitting: Psychology

## 2007-07-19 ENCOUNTER — Ambulatory Visit: Payer: Self-pay | Admitting: Internal Medicine

## 2007-07-20 ENCOUNTER — Encounter (INDEPENDENT_AMBULATORY_CARE_PROVIDER_SITE_OTHER): Payer: Self-pay | Admitting: Internal Medicine

## 2007-07-25 ENCOUNTER — Encounter (INDEPENDENT_AMBULATORY_CARE_PROVIDER_SITE_OTHER): Payer: Self-pay | Admitting: Internal Medicine

## 2007-07-26 ENCOUNTER — Telehealth (INDEPENDENT_AMBULATORY_CARE_PROVIDER_SITE_OTHER): Payer: Self-pay | Admitting: Internal Medicine

## 2007-07-26 ENCOUNTER — Ambulatory Visit (HOSPITAL_COMMUNITY): Payer: Self-pay | Admitting: Psychology

## 2007-07-31 ENCOUNTER — Ambulatory Visit (HOSPITAL_COMMUNITY): Payer: Self-pay | Admitting: Psychiatry

## 2007-08-01 ENCOUNTER — Ambulatory Visit: Payer: Self-pay | Admitting: Internal Medicine

## 2007-08-02 ENCOUNTER — Ambulatory Visit: Payer: Self-pay | Admitting: Internal Medicine

## 2007-08-02 LAB — CONVERTED CEMR LAB
ALT: 25 units/L (ref 0–35)
AST: 25 units/L (ref 0–37)
Alkaline Phosphatase: 57 units/L (ref 39–117)
BUN: 8 mg/dL (ref 6–23)
Basophils Absolute: 0.2 10*3/uL — ABNORMAL HIGH (ref 0.0–0.1)
Basophils Relative: 1 % (ref 0–1)
Chloride: 108 meq/L (ref 96–112)
Creatinine, Ser: 0.9 mg/dL (ref 0.40–1.20)
Eosinophils Absolute: 0.6 10*3/uL (ref 0.0–0.7)
Eosinophils Relative: 3 % (ref 0–5)
Hemoglobin: 13.4 g/dL (ref 12.0–15.0)
MCHC: 34.7 g/dL (ref 30.0–36.0)
MCV: 81.2 fL (ref 78.0–100.0)
Monocytes Absolute: 1.1 10*3/uL — ABNORMAL HIGH (ref 0.1–1.0)
Monocytes Relative: 6 % (ref 3–12)
Neutro Abs: 11.4 10*3/uL — ABNORMAL HIGH (ref 1.7–7.7)
RDW: 14.8 % (ref 11.5–15.5)
Total Bilirubin: 0.5 mg/dL (ref 0.3–1.2)

## 2007-08-03 ENCOUNTER — Encounter (INDEPENDENT_AMBULATORY_CARE_PROVIDER_SITE_OTHER): Payer: Self-pay | Admitting: Internal Medicine

## 2007-08-14 ENCOUNTER — Ambulatory Visit: Payer: Self-pay | Admitting: Internal Medicine

## 2007-09-11 ENCOUNTER — Ambulatory Visit: Payer: Self-pay | Admitting: Internal Medicine

## 2007-09-11 DIAGNOSIS — S139XXA Sprain of joints and ligaments of unspecified parts of neck, initial encounter: Secondary | ICD-10-CM | POA: Insufficient documentation

## 2007-09-13 LAB — CONVERTED CEMR LAB
Basophils Absolute: 0.1 10*3/uL (ref 0.0–0.1)
CO2: 21 meq/L (ref 19–32)
Calcium: 8.8 mg/dL (ref 8.4–10.5)
Creatinine, Ser: 0.72 mg/dL (ref 0.40–1.20)
Eosinophils Absolute: 0.6 10*3/uL (ref 0.0–0.7)
Eosinophils Relative: 5 % (ref 0–5)
HCT: 38.4 % (ref 36.0–46.0)
Hemoglobin: 12.9 g/dL (ref 12.0–15.0)
Lymphocytes Relative: 44 % (ref 12–46)
MCHC: 33.6 g/dL (ref 30.0–36.0)
MCV: 81.5 fL (ref 78.0–100.0)
Monocytes Absolute: 0.8 10*3/uL (ref 0.1–1.0)
Platelets: 293 10*3/uL (ref 150–400)
RDW: 17 % — ABNORMAL HIGH (ref 11.5–15.5)
Sodium: 139 meq/L (ref 135–145)

## 2007-10-02 ENCOUNTER — Ambulatory Visit (HOSPITAL_COMMUNITY): Payer: Self-pay | Admitting: Psychiatry

## 2007-10-05 ENCOUNTER — Encounter (INDEPENDENT_AMBULATORY_CARE_PROVIDER_SITE_OTHER): Payer: Self-pay | Admitting: Internal Medicine

## 2007-10-09 ENCOUNTER — Ambulatory Visit: Payer: Self-pay | Admitting: Internal Medicine

## 2007-10-10 ENCOUNTER — Encounter (INDEPENDENT_AMBULATORY_CARE_PROVIDER_SITE_OTHER): Payer: Self-pay | Admitting: Internal Medicine

## 2007-11-07 ENCOUNTER — Ambulatory Visit: Payer: Self-pay | Admitting: Internal Medicine

## 2007-11-21 ENCOUNTER — Encounter (INDEPENDENT_AMBULATORY_CARE_PROVIDER_SITE_OTHER): Payer: Self-pay | Admitting: Internal Medicine

## 2007-11-23 ENCOUNTER — Ambulatory Visit: Payer: Self-pay | Admitting: Internal Medicine

## 2007-11-24 ENCOUNTER — Telehealth (INDEPENDENT_AMBULATORY_CARE_PROVIDER_SITE_OTHER): Payer: Self-pay | Admitting: Internal Medicine

## 2007-11-25 ENCOUNTER — Inpatient Hospital Stay (HOSPITAL_COMMUNITY): Admission: EM | Admit: 2007-11-25 | Discharge: 2007-11-27 | Payer: Self-pay | Admitting: Emergency Medicine

## 2007-11-26 ENCOUNTER — Ambulatory Visit: Payer: Self-pay | Admitting: Gastroenterology

## 2007-11-27 ENCOUNTER — Ambulatory Visit: Payer: Self-pay | Admitting: Gastroenterology

## 2007-11-27 ENCOUNTER — Encounter (INDEPENDENT_AMBULATORY_CARE_PROVIDER_SITE_OTHER): Payer: Self-pay | Admitting: Internal Medicine

## 2007-11-27 ENCOUNTER — Encounter: Payer: Self-pay | Admitting: Gastroenterology

## 2007-11-27 HISTORY — PX: ESOPHAGOGASTRODUODENOSCOPY: SHX1529

## 2007-11-28 ENCOUNTER — Telehealth (INDEPENDENT_AMBULATORY_CARE_PROVIDER_SITE_OTHER): Payer: Self-pay | Admitting: Internal Medicine

## 2007-11-30 ENCOUNTER — Ambulatory Visit: Payer: Self-pay | Admitting: Internal Medicine

## 2007-12-04 ENCOUNTER — Encounter (INDEPENDENT_AMBULATORY_CARE_PROVIDER_SITE_OTHER): Payer: Self-pay | Admitting: Internal Medicine

## 2007-12-04 ENCOUNTER — Ambulatory Visit (HOSPITAL_COMMUNITY): Admission: RE | Admit: 2007-12-04 | Discharge: 2007-12-04 | Payer: Self-pay | Admitting: Internal Medicine

## 2007-12-05 ENCOUNTER — Telehealth (INDEPENDENT_AMBULATORY_CARE_PROVIDER_SITE_OTHER): Payer: Self-pay | Admitting: *Deleted

## 2007-12-06 ENCOUNTER — Ambulatory Visit: Payer: Self-pay | Admitting: Internal Medicine

## 2007-12-06 DIAGNOSIS — R5383 Other fatigue: Secondary | ICD-10-CM

## 2007-12-06 DIAGNOSIS — I498 Other specified cardiac arrhythmias: Secondary | ICD-10-CM | POA: Insufficient documentation

## 2007-12-06 DIAGNOSIS — R5381 Other malaise: Secondary | ICD-10-CM | POA: Insufficient documentation

## 2007-12-10 ENCOUNTER — Telehealth (INDEPENDENT_AMBULATORY_CARE_PROVIDER_SITE_OTHER): Payer: Self-pay | Admitting: Internal Medicine

## 2007-12-13 ENCOUNTER — Telehealth (INDEPENDENT_AMBULATORY_CARE_PROVIDER_SITE_OTHER): Payer: Self-pay | Admitting: *Deleted

## 2007-12-19 ENCOUNTER — Ambulatory Visit: Payer: Self-pay | Admitting: Internal Medicine

## 2007-12-19 ENCOUNTER — Encounter (INDEPENDENT_AMBULATORY_CARE_PROVIDER_SITE_OTHER): Payer: Self-pay | Admitting: Internal Medicine

## 2007-12-20 ENCOUNTER — Telehealth (INDEPENDENT_AMBULATORY_CARE_PROVIDER_SITE_OTHER): Payer: Self-pay | Admitting: *Deleted

## 2007-12-21 ENCOUNTER — Ambulatory Visit: Payer: Self-pay | Admitting: Internal Medicine

## 2007-12-21 DIAGNOSIS — J209 Acute bronchitis, unspecified: Secondary | ICD-10-CM | POA: Insufficient documentation

## 2007-12-27 ENCOUNTER — Ambulatory Visit (HOSPITAL_COMMUNITY): Payer: Self-pay | Admitting: Psychiatry

## 2007-12-27 ENCOUNTER — Telehealth (INDEPENDENT_AMBULATORY_CARE_PROVIDER_SITE_OTHER): Payer: Self-pay | Admitting: *Deleted

## 2007-12-28 ENCOUNTER — Ambulatory Visit: Payer: Self-pay | Admitting: Internal Medicine

## 2007-12-28 ENCOUNTER — Ambulatory Visit (HOSPITAL_COMMUNITY): Admission: RE | Admit: 2007-12-28 | Discharge: 2007-12-28 | Payer: Self-pay | Admitting: Internal Medicine

## 2007-12-28 DIAGNOSIS — M25579 Pain in unspecified ankle and joints of unspecified foot: Secondary | ICD-10-CM | POA: Insufficient documentation

## 2007-12-28 DIAGNOSIS — I10 Essential (primary) hypertension: Secondary | ICD-10-CM | POA: Insufficient documentation

## 2007-12-31 ENCOUNTER — Ambulatory Visit: Payer: Self-pay | Admitting: Internal Medicine

## 2008-01-02 ENCOUNTER — Encounter (INDEPENDENT_AMBULATORY_CARE_PROVIDER_SITE_OTHER): Payer: Self-pay | Admitting: Internal Medicine

## 2008-01-02 ENCOUNTER — Ambulatory Visit (HOSPITAL_COMMUNITY): Admission: RE | Admit: 2008-01-02 | Discharge: 2008-01-02 | Payer: Self-pay | Admitting: Internal Medicine

## 2008-01-04 ENCOUNTER — Ambulatory Visit (HOSPITAL_COMMUNITY): Admission: RE | Admit: 2008-01-04 | Discharge: 2008-01-04 | Payer: Self-pay | Admitting: Internal Medicine

## 2008-01-04 ENCOUNTER — Ambulatory Visit: Payer: Self-pay | Admitting: Internal Medicine

## 2008-01-08 ENCOUNTER — Encounter (INDEPENDENT_AMBULATORY_CARE_PROVIDER_SITE_OTHER): Payer: Self-pay | Admitting: Internal Medicine

## 2008-01-11 ENCOUNTER — Ambulatory Visit: Payer: Self-pay | Admitting: Internal Medicine

## 2008-01-11 DIAGNOSIS — R9389 Abnormal findings on diagnostic imaging of other specified body structures: Secondary | ICD-10-CM | POA: Insufficient documentation

## 2008-01-28 ENCOUNTER — Encounter (INDEPENDENT_AMBULATORY_CARE_PROVIDER_SITE_OTHER): Payer: Self-pay | Admitting: Internal Medicine

## 2008-02-08 ENCOUNTER — Ambulatory Visit: Payer: Self-pay | Admitting: Internal Medicine

## 2008-02-21 ENCOUNTER — Ambulatory Visit: Payer: Self-pay | Admitting: Internal Medicine

## 2008-03-04 ENCOUNTER — Encounter (INDEPENDENT_AMBULATORY_CARE_PROVIDER_SITE_OTHER): Payer: Self-pay | Admitting: Internal Medicine

## 2008-03-10 ENCOUNTER — Ambulatory Visit: Payer: Self-pay | Admitting: Internal Medicine

## 2008-03-11 ENCOUNTER — Encounter (INDEPENDENT_AMBULATORY_CARE_PROVIDER_SITE_OTHER): Payer: Self-pay | Admitting: Internal Medicine

## 2008-03-11 LAB — CONVERTED CEMR LAB
Alkaline Phosphatase: 76 units/L (ref 39–117)
BUN: 9 mg/dL (ref 6–23)
Eosinophils Absolute: 0.5 10*3/uL (ref 0.0–0.7)
Eosinophils Relative: 3 % (ref 0–5)
Glucose, Bld: 70 mg/dL (ref 70–99)
HCT: 39.5 % (ref 36.0–46.0)
Lymphs Abs: 7.3 10*3/uL — ABNORMAL HIGH (ref 0.7–4.0)
MCHC: 32.2 g/dL (ref 30.0–36.0)
MCV: 84 fL (ref 78.0–100.0)
Monocytes Relative: 6 % (ref 3–12)
Platelets: 312 10*3/uL (ref 150–400)
Sodium: 143 meq/L (ref 135–145)
Total Bilirubin: 0.3 mg/dL (ref 0.3–1.2)
WBC: 18.9 10*3/uL — ABNORMAL HIGH (ref 4.0–10.5)

## 2008-03-12 ENCOUNTER — Encounter (INDEPENDENT_AMBULATORY_CARE_PROVIDER_SITE_OTHER): Payer: Self-pay | Admitting: Internal Medicine

## 2008-03-18 ENCOUNTER — Ambulatory Visit (HOSPITAL_COMMUNITY): Admission: RE | Admit: 2008-03-18 | Discharge: 2008-03-18 | Payer: Self-pay | Admitting: Internal Medicine

## 2008-03-24 ENCOUNTER — Encounter (INDEPENDENT_AMBULATORY_CARE_PROVIDER_SITE_OTHER): Payer: Self-pay | Admitting: Internal Medicine

## 2008-03-24 ENCOUNTER — Telehealth: Payer: Self-pay | Admitting: Gastroenterology

## 2008-03-27 ENCOUNTER — Ambulatory Visit (HOSPITAL_COMMUNITY): Payer: Self-pay | Admitting: Psychiatry

## 2008-04-07 ENCOUNTER — Ambulatory Visit: Payer: Self-pay | Admitting: Internal Medicine

## 2008-04-10 ENCOUNTER — Encounter (INDEPENDENT_AMBULATORY_CARE_PROVIDER_SITE_OTHER): Payer: Self-pay | Admitting: Internal Medicine

## 2008-04-14 ENCOUNTER — Telehealth (INDEPENDENT_AMBULATORY_CARE_PROVIDER_SITE_OTHER): Payer: Self-pay

## 2008-04-15 ENCOUNTER — Telehealth (INDEPENDENT_AMBULATORY_CARE_PROVIDER_SITE_OTHER): Payer: Self-pay | Admitting: Internal Medicine

## 2008-04-15 ENCOUNTER — Encounter (INDEPENDENT_AMBULATORY_CARE_PROVIDER_SITE_OTHER): Payer: Self-pay | Admitting: Internal Medicine

## 2008-04-17 ENCOUNTER — Encounter (INDEPENDENT_AMBULATORY_CARE_PROVIDER_SITE_OTHER): Payer: Self-pay | Admitting: Internal Medicine

## 2008-04-23 ENCOUNTER — Telehealth (INDEPENDENT_AMBULATORY_CARE_PROVIDER_SITE_OTHER): Payer: Self-pay | Admitting: *Deleted

## 2008-04-24 ENCOUNTER — Ambulatory Visit (HOSPITAL_COMMUNITY): Payer: Self-pay | Admitting: Psychiatry

## 2008-05-06 ENCOUNTER — Encounter: Payer: Self-pay | Admitting: Internal Medicine

## 2008-05-06 ENCOUNTER — Ambulatory Visit (HOSPITAL_COMMUNITY): Payer: Self-pay | Admitting: Psychology

## 2008-05-06 ENCOUNTER — Ambulatory Visit: Payer: Self-pay | Admitting: Internal Medicine

## 2008-05-06 LAB — CONVERTED CEMR LAB: Hepatitis B Surface Ag: NEGATIVE

## 2008-05-09 ENCOUNTER — Ambulatory Visit: Payer: Self-pay | Admitting: Internal Medicine

## 2008-05-22 ENCOUNTER — Encounter: Payer: Self-pay | Admitting: Internal Medicine

## 2008-05-26 ENCOUNTER — Telehealth (INDEPENDENT_AMBULATORY_CARE_PROVIDER_SITE_OTHER): Payer: Self-pay | Admitting: *Deleted

## 2008-05-29 ENCOUNTER — Ambulatory Visit: Payer: Self-pay | Admitting: Internal Medicine

## 2008-05-29 ENCOUNTER — Ambulatory Visit (HOSPITAL_COMMUNITY): Admission: RE | Admit: 2008-05-29 | Discharge: 2008-05-29 | Payer: Self-pay | Admitting: Internal Medicine

## 2008-05-29 DIAGNOSIS — M542 Cervicalgia: Secondary | ICD-10-CM | POA: Insufficient documentation

## 2008-06-04 ENCOUNTER — Ambulatory Visit (HOSPITAL_COMMUNITY): Admission: RE | Admit: 2008-06-04 | Discharge: 2008-06-04 | Payer: Self-pay | Admitting: Internal Medicine

## 2008-06-04 ENCOUNTER — Ambulatory Visit: Payer: Self-pay | Admitting: Internal Medicine

## 2008-06-10 ENCOUNTER — Telehealth (INDEPENDENT_AMBULATORY_CARE_PROVIDER_SITE_OTHER): Payer: Self-pay | Admitting: Internal Medicine

## 2008-06-16 ENCOUNTER — Ambulatory Visit: Payer: Self-pay | Admitting: Internal Medicine

## 2008-06-17 ENCOUNTER — Encounter (INDEPENDENT_AMBULATORY_CARE_PROVIDER_SITE_OTHER): Payer: Self-pay | Admitting: Internal Medicine

## 2008-06-17 LAB — CONVERTED CEMR LAB
ALT: 32 units/L (ref 0–35)
AST: 28 units/L (ref 0–37)
Albumin: 4.3 g/dL (ref 3.5–5.2)
Basophils Absolute: 0.1 10*3/uL (ref 0.0–0.1)
CO2: 18 meq/L — ABNORMAL LOW (ref 19–32)
Calcium: 8.7 mg/dL (ref 8.4–10.5)
Chloride: 106 meq/L (ref 96–112)
Eosinophils Relative: 3 % (ref 0–5)
HCT: 41.9 % (ref 36.0–46.0)
Hemoglobin: 14.3 g/dL (ref 12.0–15.0)
Lymphocytes Relative: 45 % (ref 12–46)
Lymphs Abs: 6.7 10*3/uL — ABNORMAL HIGH (ref 0.7–4.0)
Monocytes Absolute: 0.8 10*3/uL (ref 0.1–1.0)
Neutro Abs: 6.9 10*3/uL (ref 1.7–7.7)
Platelets: 316 10*3/uL (ref 150–400)
Potassium: 3.8 meq/L (ref 3.5–5.3)
RDW: 15.9 % — ABNORMAL HIGH (ref 11.5–15.5)
Total Protein: 7.4 g/dL (ref 6.0–8.3)
Vitamin B-12: >2000 pg/mL — ABNORMAL HIGH (ref 211–911)
WBC: 14.8 10*3/uL — ABNORMAL HIGH (ref 4.0–10.5)

## 2008-06-18 ENCOUNTER — Telehealth (INDEPENDENT_AMBULATORY_CARE_PROVIDER_SITE_OTHER): Payer: Self-pay | Admitting: Internal Medicine

## 2008-06-18 ENCOUNTER — Ambulatory Visit (HOSPITAL_COMMUNITY): Payer: Self-pay | Admitting: Psychology

## 2008-06-18 LAB — CONVERTED CEMR LAB
AST: 24 units/L (ref 0–37)
Albumin: 4.4 g/dL (ref 3.5–5.2)
BUN: 12 mg/dL (ref 6–23)
Calcium: 9.5 mg/dL (ref 8.4–10.5)
Chloride: 103 meq/L (ref 96–112)
Eosinophils Relative: 2 % (ref 0–5)
Glucose, Bld: 86 mg/dL (ref 70–99)
HCT: 44.4 % (ref 36.0–46.0)
Hemoglobin: 15.4 g/dL — ABNORMAL HIGH (ref 12.0–15.0)
Lymphocytes Relative: 39 % (ref 12–46)
Lymphs Abs: 6.8 10*3/uL — ABNORMAL HIGH (ref 0.7–4.0)
Monocytes Absolute: 1.2 10*3/uL — ABNORMAL HIGH (ref 0.1–1.0)
Potassium: 4.5 meq/L (ref 3.5–5.3)
WBC: 17.7 10*3/uL — ABNORMAL HIGH (ref 4.0–10.5)

## 2008-06-30 ENCOUNTER — Ambulatory Visit (HOSPITAL_COMMUNITY): Admission: RE | Admit: 2008-06-30 | Discharge: 2008-07-01 | Payer: Self-pay | Admitting: Neurosurgery

## 2008-07-14 ENCOUNTER — Ambulatory Visit (HOSPITAL_COMMUNITY): Payer: Self-pay | Admitting: Psychology

## 2008-07-18 ENCOUNTER — Inpatient Hospital Stay (HOSPITAL_COMMUNITY): Admission: AD | Admit: 2008-07-18 | Discharge: 2008-07-26 | Payer: Self-pay | Admitting: Neurosurgery

## 2008-07-19 ENCOUNTER — Encounter (INDEPENDENT_AMBULATORY_CARE_PROVIDER_SITE_OTHER): Payer: Self-pay | Admitting: Internal Medicine

## 2008-07-21 ENCOUNTER — Encounter (INDEPENDENT_AMBULATORY_CARE_PROVIDER_SITE_OTHER): Payer: Self-pay | Admitting: Internal Medicine

## 2008-07-28 ENCOUNTER — Ambulatory Visit: Payer: Self-pay | Admitting: Internal Medicine

## 2008-08-05 ENCOUNTER — Ambulatory Visit (HOSPITAL_COMMUNITY): Payer: Self-pay | Admitting: Psychiatry

## 2008-08-15 ENCOUNTER — Telehealth (INDEPENDENT_AMBULATORY_CARE_PROVIDER_SITE_OTHER): Payer: Self-pay | Admitting: *Deleted

## 2008-08-26 ENCOUNTER — Ambulatory Visit (HOSPITAL_COMMUNITY): Payer: Self-pay | Admitting: Psychology

## 2008-08-27 ENCOUNTER — Ambulatory Visit: Payer: Self-pay | Admitting: Internal Medicine

## 2008-09-02 ENCOUNTER — Ambulatory Visit (HOSPITAL_COMMUNITY): Payer: Self-pay | Admitting: Psychiatry

## 2008-09-03 ENCOUNTER — Ambulatory Visit: Payer: Self-pay | Admitting: Internal Medicine

## 2008-09-03 ENCOUNTER — Encounter: Payer: Self-pay | Admitting: Gastroenterology

## 2008-09-09 LAB — CONVERTED CEMR LAB
Basophils Absolute: 0 10*3/uL (ref 0.0–0.1)
Hemoglobin: 14 g/dL (ref 12.0–15.0)
Lymphocytes Relative: 45 % (ref 12–46)
Monocytes Absolute: 0.9 10*3/uL (ref 0.1–1.0)
Neutro Abs: 7.6 10*3/uL (ref 1.7–7.7)
Neutrophils Relative %: 45 % (ref 43–77)
RDW: 16.1 % — ABNORMAL HIGH (ref 11.5–15.5)

## 2008-09-26 ENCOUNTER — Ambulatory Visit (HOSPITAL_COMMUNITY): Payer: Self-pay | Admitting: Psychology

## 2008-09-29 ENCOUNTER — Ambulatory Visit: Payer: Self-pay | Admitting: Internal Medicine

## 2008-09-30 ENCOUNTER — Ambulatory Visit (HOSPITAL_COMMUNITY): Payer: Self-pay | Admitting: Psychiatry

## 2008-10-09 ENCOUNTER — Encounter: Admission: RE | Admit: 2008-10-09 | Discharge: 2008-10-09 | Payer: Self-pay | Admitting: Neurosurgery

## 2008-10-22 ENCOUNTER — Encounter: Payer: Self-pay | Admitting: Gastroenterology

## 2008-10-30 ENCOUNTER — Ambulatory Visit: Payer: Self-pay | Admitting: Internal Medicine

## 2008-12-24 ENCOUNTER — Encounter (INDEPENDENT_AMBULATORY_CARE_PROVIDER_SITE_OTHER): Payer: Self-pay | Admitting: Internal Medicine

## 2009-02-20 ENCOUNTER — Emergency Department (HOSPITAL_COMMUNITY): Admission: EM | Admit: 2009-02-20 | Discharge: 2009-02-21 | Payer: Self-pay | Admitting: Emergency Medicine

## 2009-02-24 ENCOUNTER — Telehealth (INDEPENDENT_AMBULATORY_CARE_PROVIDER_SITE_OTHER): Payer: Self-pay

## 2009-03-10 ENCOUNTER — Ambulatory Visit: Payer: Self-pay | Admitting: Internal Medicine

## 2009-03-16 ENCOUNTER — Encounter: Payer: Self-pay | Admitting: Internal Medicine

## 2009-03-16 LAB — CONVERTED CEMR LAB
ALT: 13 units/L (ref 0–35)
AST: 13 units/L (ref 0–37)
Albumin: 3.6 g/dL (ref 3.5–5.2)
Alkaline Phosphatase: 70 units/L (ref 39–117)
Basophils Relative: 0 % (ref 0–1)
Calcium: 9 mg/dL (ref 8.4–10.5)
Chloride: 108 meq/L (ref 96–112)
Eosinophils Absolute: 0.6 10*3/uL (ref 0.0–0.7)
Eosinophils Relative: 4 % (ref 0–5)
Hemoglobin: 12.6 g/dL (ref 12.0–15.0)
MCHC: 34.9 g/dL (ref 30.0–36.0)
MCV: 82 fL (ref 78.0–100.0)
Monocytes Absolute: 0.7 10*3/uL (ref 0.1–1.0)
Monocytes Relative: 4 % (ref 3–12)
Neutrophils Relative %: 47 % (ref 43–77)
Potassium: 4.1 meq/L (ref 3.5–5.3)
RBC: 4.4 M/uL (ref 3.87–5.11)
Sodium: 143 meq/L (ref 135–145)
Total Protein: 6.4 g/dL (ref 6.0–8.3)

## 2009-03-18 LAB — CONVERTED CEMR LAB
Basophils Absolute: 0.1 10*3/uL (ref 0.0–0.1)
Basophils Relative: 0 % (ref 0–1)
Eosinophils Absolute: 0.5 10*3/uL (ref 0.0–0.7)
MCHC: 34.6 g/dL (ref 30.0–36.0)
MCV: 81.7 fL (ref 78.0–100.0)
Monocytes Relative: 6 % (ref 3–12)
Neutrophils Relative %: 48 % (ref 43–77)
Platelets: 358 10*3/uL (ref 150–400)
RBC: 5.02 M/uL (ref 3.87–5.11)
RDW: 16.3 % — ABNORMAL HIGH (ref 11.5–15.5)

## 2009-03-19 ENCOUNTER — Encounter: Payer: Self-pay | Admitting: Internal Medicine

## 2009-03-27 ENCOUNTER — Encounter: Payer: Self-pay | Admitting: Urgent Care

## 2009-03-27 ENCOUNTER — Ambulatory Visit: Payer: Self-pay | Admitting: Gastroenterology

## 2009-03-30 ENCOUNTER — Encounter (INDEPENDENT_AMBULATORY_CARE_PROVIDER_SITE_OTHER): Payer: Self-pay

## 2009-04-03 ENCOUNTER — Encounter: Payer: Self-pay | Admitting: Internal Medicine

## 2009-04-19 ENCOUNTER — Emergency Department (HOSPITAL_COMMUNITY): Admission: EM | Admit: 2009-04-19 | Discharge: 2009-04-19 | Payer: Self-pay | Admitting: Emergency Medicine

## 2009-05-05 ENCOUNTER — Encounter (INDEPENDENT_AMBULATORY_CARE_PROVIDER_SITE_OTHER): Payer: Self-pay

## 2009-05-21 ENCOUNTER — Encounter: Payer: Self-pay | Admitting: Internal Medicine

## 2009-06-08 ENCOUNTER — Ambulatory Visit: Payer: Self-pay | Admitting: Family Medicine

## 2009-06-08 DIAGNOSIS — E669 Obesity, unspecified: Secondary | ICD-10-CM | POA: Insufficient documentation

## 2009-06-08 DIAGNOSIS — J019 Acute sinusitis, unspecified: Secondary | ICD-10-CM | POA: Insufficient documentation

## 2009-06-08 LAB — CONVERTED CEMR LAB
Hgb A1c MFr Bld: 5.5 % (ref 4.6–6.1)
Total CHOL/HDL Ratio: 2.3
VLDL: 20 mg/dL (ref 0–40)
Vit D, 25-Hydroxy: 34 ng/mL (ref 30–89)
Vitamin B-12: 704 pg/mL (ref 211–911)

## 2009-06-09 LAB — CONVERTED CEMR LAB
Basophils Absolute: 0.1 10*3/uL (ref 0.0–0.1)
Basophils Relative: 1 % (ref 0–1)
MCHC: 32.5 g/dL (ref 30.0–36.0)
Neutro Abs: 7.6 10*3/uL (ref 1.7–7.7)
Neutrophils Relative %: 50 % (ref 43–77)
RBC: 4.43 M/uL (ref 3.87–5.11)
RDW: 15.3 % (ref 11.5–15.5)

## 2009-06-10 ENCOUNTER — Encounter (INDEPENDENT_AMBULATORY_CARE_PROVIDER_SITE_OTHER): Payer: Self-pay | Admitting: *Deleted

## 2009-06-10 ENCOUNTER — Telehealth (INDEPENDENT_AMBULATORY_CARE_PROVIDER_SITE_OTHER): Payer: Self-pay

## 2009-06-10 ENCOUNTER — Encounter: Payer: Self-pay | Admitting: Family Medicine

## 2009-06-17 ENCOUNTER — Telehealth (INDEPENDENT_AMBULATORY_CARE_PROVIDER_SITE_OTHER): Payer: Self-pay

## 2009-06-17 ENCOUNTER — Encounter: Payer: Self-pay | Admitting: Internal Medicine

## 2009-06-24 ENCOUNTER — Emergency Department (HOSPITAL_COMMUNITY): Admission: EM | Admit: 2009-06-24 | Discharge: 2009-06-24 | Payer: Self-pay | Admitting: Emergency Medicine

## 2009-06-25 ENCOUNTER — Telehealth: Payer: Self-pay | Admitting: Physician Assistant

## 2009-07-17 ENCOUNTER — Encounter (INDEPENDENT_AMBULATORY_CARE_PROVIDER_SITE_OTHER): Payer: Self-pay

## 2009-07-22 ENCOUNTER — Encounter: Payer: Self-pay | Admitting: Urgent Care

## 2009-07-26 ENCOUNTER — Emergency Department (HOSPITAL_COMMUNITY): Admission: EM | Admit: 2009-07-26 | Discharge: 2009-07-26 | Payer: Self-pay | Admitting: Emergency Medicine

## 2009-07-28 ENCOUNTER — Ambulatory Visit: Payer: Self-pay | Admitting: Family Medicine

## 2009-07-30 ENCOUNTER — Ambulatory Visit: Payer: Self-pay | Admitting: Family Medicine

## 2009-08-10 ENCOUNTER — Other Ambulatory Visit: Admission: RE | Admit: 2009-08-10 | Discharge: 2009-08-10 | Payer: Self-pay | Admitting: Family Medicine

## 2009-08-10 ENCOUNTER — Ambulatory Visit: Payer: Self-pay | Admitting: Family Medicine

## 2009-08-10 DIAGNOSIS — B009 Herpesviral infection, unspecified: Secondary | ICD-10-CM | POA: Insufficient documentation

## 2009-08-10 DIAGNOSIS — B353 Tinea pedis: Secondary | ICD-10-CM | POA: Insufficient documentation

## 2009-08-10 LAB — CONVERTED CEMR LAB
Glucose, Urine, Semiquant: NEGATIVE
Specific Gravity, Urine: 1.025
WBC Urine, dipstick: NEGATIVE
pH: 5.5

## 2009-08-11 ENCOUNTER — Encounter: Payer: Self-pay | Admitting: Physician Assistant

## 2009-08-11 ENCOUNTER — Ambulatory Visit: Payer: Self-pay | Admitting: Internal Medicine

## 2009-08-11 ENCOUNTER — Telehealth: Payer: Self-pay | Admitting: Physician Assistant

## 2009-08-12 ENCOUNTER — Encounter: Payer: Self-pay | Admitting: Internal Medicine

## 2009-08-13 ENCOUNTER — Ambulatory Visit (HOSPITAL_COMMUNITY): Admission: RE | Admit: 2009-08-13 | Discharge: 2009-08-13 | Payer: Self-pay | Admitting: Family Medicine

## 2009-08-14 ENCOUNTER — Encounter (INDEPENDENT_AMBULATORY_CARE_PROVIDER_SITE_OTHER): Payer: Self-pay

## 2009-08-14 LAB — CONVERTED CEMR LAB
Basophils Relative: 0 % (ref 0–1)
Eosinophils Absolute: 0.4 10*3/uL (ref 0.0–0.7)
MCHC: 33.5 g/dL (ref 30.0–36.0)
MCV: 84.7 fL (ref 78.0–100.0)
Monocytes Relative: 4 % (ref 3–12)
Neutrophils Relative %: 43 % (ref 43–77)
Platelets: 352 10*3/uL (ref 150–400)

## 2009-08-21 ENCOUNTER — Encounter (INDEPENDENT_AMBULATORY_CARE_PROVIDER_SITE_OTHER): Payer: Self-pay

## 2009-09-24 ENCOUNTER — Encounter: Payer: Self-pay | Admitting: Gastroenterology

## 2009-10-08 ENCOUNTER — Encounter: Payer: Self-pay | Admitting: Internal Medicine

## 2009-10-08 LAB — CONVERTED CEMR LAB
Basophils Absolute: 0.1 10*3/uL (ref 0.0–0.1)
Eosinophils Relative: 3 % (ref 0–5)
HCT: 38.4 % (ref 36.0–46.0)
Lymphocytes Relative: 44 % (ref 12–46)
Platelets: 334 10*3/uL (ref 150–400)
RDW: 15.2 % (ref 11.5–15.5)

## 2009-10-16 ENCOUNTER — Encounter: Payer: Self-pay | Admitting: Gastroenterology

## 2009-12-11 ENCOUNTER — Ambulatory Visit: Payer: Self-pay | Admitting: Internal Medicine

## 2010-01-22 ENCOUNTER — Encounter (INDEPENDENT_AMBULATORY_CARE_PROVIDER_SITE_OTHER): Payer: Self-pay

## 2010-02-12 ENCOUNTER — Encounter: Payer: Self-pay | Admitting: Gastroenterology

## 2010-02-17 ENCOUNTER — Ambulatory Visit: Payer: Self-pay | Admitting: Family Medicine

## 2010-03-08 ENCOUNTER — Telehealth: Payer: Self-pay | Admitting: Family Medicine

## 2010-03-09 ENCOUNTER — Emergency Department (HOSPITAL_COMMUNITY)
Admission: EM | Admit: 2010-03-09 | Discharge: 2010-03-09 | Payer: Self-pay | Source: Home / Self Care | Admitting: Emergency Medicine

## 2010-03-09 ENCOUNTER — Telehealth: Payer: Self-pay | Admitting: Family Medicine

## 2010-03-23 ENCOUNTER — Ambulatory Visit: Admit: 2010-03-23 | Payer: Self-pay | Admitting: Gastroenterology

## 2010-03-27 ENCOUNTER — Encounter: Payer: Self-pay | Admitting: Internal Medicine

## 2010-03-27 ENCOUNTER — Encounter: Payer: Self-pay | Admitting: Neurosurgery

## 2010-03-28 ENCOUNTER — Encounter: Payer: Self-pay | Admitting: Internal Medicine

## 2010-04-04 LAB — CONVERTED CEMR LAB
AST: 18 units/L (ref 0–37)
Alkaline Phosphatase: 74 units/L (ref 39–117)
Basophils Absolute: 0.4 10*3/uL — ABNORMAL HIGH (ref 0.0–0.1)
Basophils Relative: 2 % — ABNORMAL HIGH (ref 0–1)
Eosinophils Absolute: 0.4 10*3/uL (ref 0.0–0.7)
Eosinophils Relative: 3 % (ref 0–5)
Glucose, Bld: 119 mg/dL — ABNORMAL HIGH (ref 70–99)
HCT: 37.8 % (ref 36.0–46.0)
Lymphs Abs: 5.5 10*3/uL — ABNORMAL HIGH (ref 0.7–4.0)
MCHC: 34.5 g/dL (ref 30.0–36.0)
MCV: 83.7 fL (ref 78.0–100.0)
Neutrophils Relative %: 50 % (ref 43–77)
Platelets: 301 10*3/uL (ref 150–400)
RDW: 16.1 % — ABNORMAL HIGH (ref 11.5–15.5)
Sodium: 138 meq/L (ref 135–145)
Total Bilirubin: 0.3 mg/dL (ref 0.3–1.2)
Total Protein: 6 g/dL (ref 6.0–8.3)

## 2010-04-06 NOTE — Assessment & Plan Note (Signed)
Summary: follow up abcess - room 2   Vital Signs:  Patient profile:   40 year old female Height:      67.5 inches Weight:      185.25 pounds BMI:     28.69 O2 Sat:      100 % on Room air Pulse rate:   90 / minute Resp:     16 per minute BP sitting:   110 / 78  (left arm)  Vitals Entered By: Baldomero Lamy LPN (Jul 28, 4648 35:46 AM) CC: follow up on under arm abcess Is Patient Diabetic? No Pain Assessment Patient in pain? no      Comments did not bring meds to ov   Primary Provider:  NONE @ present  CC:  follow up on under arm abcess.  History of Present Illness: Pt was seen in the ER on Sun 5/22 with abscess Rt axilla.  I&D was done, and packed.  She is taking her antibiotic two times a day as prescribed (Doxycycline).  Pt states the area is much less painful than previous.  Also is less swollen.  Is still draining, but is decreasing.  No fever or chills.  Allergies (verified): 1)  ! Tylox 2)  ! Ultram  Review of Systems General:  Denies chills and fever.  Physical Exam  General:  Well-developed,well-nourished,in no acute distress; alert,appropriate and cooperative throughout examination Head:  Normocephalic and atraumatic without obvious abnormalities. No apparent alopecia or balding. Skin:  Rt axilla approx 1 x 2 cm abscess noted.  Gauze tail from packing noted and I pulled out a small amt. Small amt of pus noted too. Axillary Nodes:  no R axillary adenopathy.   Psych:  memory intact for recent and remote, normally interactive, and good eye contact.     Impression & Recommendations:  Problem # 1:  AXILLARY ABSCESS (ICD-682.3) Assessment Improved Continue current antibiotic. Will recheck in 2 days and plan to remove gauze packing.  Complete Medication List: 1)  Celexa 40 Mg Tabs (Citalopram hydrobromide) .Marland Kitchen.. 1 by mouth once daily 2)  Seroquel 100 Mg Tabs (Quetiapine fumarate) .Marland Kitchen.. 1 by mouth at bedtime 3)  Caltrate 600 Tabs (Calcium carbonate tabs) .... Once  daily 4)  Centrum Tabs (Multiple vitamins-minerals) .... Once daily 5)  Tylenol Extra Strength 500 Mg Tabs (Acetaminophen) .... As needed 6)  Trazodone Hcl 100 Mg Tabs (Trazodone hcl) .Marland Kitchen.. 1 by mouth at bedtime 7)  Omeprazole 20 Mg Cpdr (Omeprazole) .Marland Kitchen.. 1 daily 8)  Klor-con M20 20 Meq Cr-tabs (Potassium chloride crys cr) .... One tab by mouth once daily  Patient Instructions: 1)  Recheck appt on Thursday (2 days). 2)  Continue changing your dressing daily. 3)  Continue your current antibiotic.

## 2010-04-06 NOTE — Letter (Signed)
Summary: consults  consults   Imported By: Dierdre Harness 02/02/2010 14:47:38  _____________________________________________________________________  External Attachment:    Type:   Image     Comment:   External Document

## 2010-04-06 NOTE — Letter (Signed)
Summary: DX BONE DENSITY 2009  DX BONE DENSITY 2009   Imported By: Zeb Comfort 04/03/2009 10:45:26  _____________________________________________________________________  External Attachment:    Type:   Image     Comment:   External Document  Appended Document: DX BONE DENSITY 2009 need separately reported DEXA dictation on chart

## 2010-04-06 NOTE — Progress Notes (Signed)
  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  She had not received Vit B12 injection since Aug 2010. Is Vit B12 still indicated for her? Kennith Gain PA-C  Follow-up for Phone Call        Flag message from Dr Gala Romney: "information on  serum B12 level noted. This means she has a enough functioning  ileum to adequately absorb vitamin B 12 on her own. I suspect the Humira is contributing to mucosal healing as well. With a vitamin B12 level over 700 she does not need supplementation. I would recommend checking vitamin B12 level at 6 month intervals for the time being, however. Thanks." Follow-up by: Kennith Gain PA,  August 11, 2009 2:57 PM

## 2010-04-06 NOTE — Miscellaneous (Signed)
Summary: Orders Update  Clinical Lists Changes  Orders: Added new Test order of T-CBC w/Diff [phone]) - Signed

## 2010-04-06 NOTE — Miscellaneous (Signed)
Summary: Orders Update  Clinical Lists Changes  Orders: Added new Test order of T-CBC w/Diff [phone]) - Signed

## 2010-04-06 NOTE — Progress Notes (Signed)
Summary: MED  Phone Note Call from Patient   Summary of Call: WENT TO ED LAST NIGHT AND THEY SAID THAT SHE NEEDS POT. PILLS  SEND TO [facility]. CALL BACK AT [phone] TO LET HER KNOW Initial call taken by: Dierdre Harness,  June 25, 2009 10:01 AM  Follow-up for Phone Call        Klor Con 20 meq one daily is on her med list. Is she taking this? Follow-up by: Kennith Gain PA,  June 25, 2009 1:16 PM  Additional Follow-up for Phone Call Additional follow up Details #1::        returned call, left message Additional Follow-up by: Baldomero Lamy LPN,  June 25, 5108 1:32 PM    Additional Follow-up for Phone Call Additional follow up Details #2::    rx sent, patient aware Follow-up by: Baldomero Lamy LPN,  June 25, 2109 1:40 PM  New/Updated Medications: KLOR-CON M20 20 MEQ CR-TABS (POTASSIUM CHLORIDE CRYS CR) one tab by mouth once daily Prescriptions: KLOR-CON M20 20 MEQ CR-TABS (POTASSIUM CHLORIDE CRYS CR) one tab by mouth once daily  #30 x 3   Entered by:   Baldomero Lamy LPN   Authorized by:   Kennith Gain PA   Signed by:   Baldomero Lamy LPN on 73/56/7014   Method used:   Electronically to        [facility] (retail)       [address]       [place], [place]  [address]       Ph: [phone] or [phone]       Fax: [phone]   RxID:   [id number]

## 2010-04-06 NOTE — Letter (Signed)
Summary: demo  demo   Imported By: Dierdre Harness 02/02/2010 14:48:26  _____________________________________________________________________  External Attachment:    Type:   Image     Comment:   External Document

## 2010-04-06 NOTE — Letter (Signed)
Summary: transferred records  transferred records   Imported By: Dierdre Harness 02/02/2010 14:50:24  _____________________________________________________________________  External Attachment:    Type:   Image     Comment:   External Document
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
## 2010-04-06 NOTE — Letter (Signed)
Summary: office notes  office notes   Imported By: Dierdre Harness 02/02/2010 14:49:23  _____________________________________________________________________  External Attachment:    Type:   Image     Comment:   External Document
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
## 2010-04-06 NOTE — Letter (Signed)
Summary: Recall, Labs Needed  [facility]  [address]   [place], [place] [address]   Phone: [phone]  Fax: [phone]    January 22, 2010  [name] [place], [place]  [address] 1970-08-13   Dear [name],   Our records indicate it is time to repeat your blood work.  You can take the enclosed form to the lab on or near the date indicated.  Please make note of the new location of the lab:   [place], 2nd floor   [place] office will call you within a week to ten business days with the results.  If you do not hear from [place] in 10 business days, you should call the office.  If you have any questions regarding this, call the office at [phone], and ask for the nurse.  Labs are due on 02/08/2010.   Sincerely,    Burnadette Peter LPN  [facility] Ph: [phone]   Fax: [phone]

## 2010-04-06 NOTE — Letter (Signed)
Summary: Recall, Labs Needed  [facility]  [address]   [place], [place] [address]   Phone: [phone]  Fax: [phone]    March 30, 2009  [place], [place]  [address] Dec 19, 1970   Dear Ms. Railsback,   Our records indicate it is time to repeat your blood work.  You can take the enclosed form to the lab on or near the date indicated.  Please make note of the new location of the lab:   [place], 2nd floor   [place] office will call you within a week to ten business days with the results.  If you do not hear from [place] in 10 business days, you should call the office.  If you have any questions regarding this, call the office at [phone], and ask for the nurse.  Labs are due on 04/23/2009.   Sincerely,    Burnadette Peter LPN  [facility] Ph: [phone]   Fax: [phone]

## 2010-04-06 NOTE — Progress Notes (Signed)
----   Converted from flag ---- ---- 06/17/2009 3:14 PM, Bridgette Habermann MD, Quentin Ore wrote: that will be fine as is  ---- 06/17/2009 2:59 PM, Burnadette Peter LPN wrote: pt already has appt scheduled for 07/03/09. Do you want [place] to move her appt? ------------------------------

## 2010-04-06 NOTE — Miscellaneous (Signed)
Summary: Orders Update  Clinical Lists Changes  Orders: Added new Test order of T-CBC w/Diff [phone]) - Signed

## 2010-04-06 NOTE — Letter (Signed)
Summary: TB Skin Test  [facility]  [address]   [place],  [address]   Phone: [phone]  Fax: [phone]          TB Skin Test    [name] DOB: 04/09/1970   Vaccine Type: PPD    Placed: 06/08/09    Site: left forearm    Mfr: [place]    Dose: 0.1 ml    Route: ID    Given by: Baldomero Lamy LPN    Exp. Date: 07/10/2011    Lot #: [id number] Read: Baldomero Lamy, LPN  June 11, 9430 0:03 AM    Negative, 12m

## 2010-04-06 NOTE — Assessment & Plan Note (Signed)
Summary: follow up abcess- room 1   Vital Signs:  Patient profile:   40 year old female Height:      67.5 inches Weight:      185.25 pounds BMI:     28.69 O2 Sat:      100 % on Room air Pulse rate:   87 / minute Resp:     16 per minute BP sitting:   100 / 70  (left arm)  Vitals Entered By: Baldomero Lamy LPN (Jul 31, 347 61:16 AM) CC: follow up abcess Is Patient Diabetic? No Pain Assessment Patient in pain? no        Primary Provider:  Kennith Gain PA  CC:  follow up abcess.  History of Present Illness: Pt is here today for f/u abscess Rt axilla.  See 07-28-09 OV. Pt states the area is doing much better.  No more drainage.  Swelling has gone down alot.  Not painful like it was.  No fever. She is taking her antibiotic and prescribed and is tolerating well.   Allergies (verified): 1)  ! Tylox 2)  ! Ultram  Review of Systems General:  Denies chills and fever.  Physical Exam  General:  Well-developed,well-nourished,in no acute distress; alert,appropriate and cooperative throughout examination Head:  Normocephalic and atraumatic without obvious abnormalities. No apparent alopecia or balding. Skin:  Rt Axilla abscess has significant reduction in swelling and erythema in last 2 days.  No drainage. Packing was removed. Axillary Nodes:  No palpable lymphadenopathy Psych:  Cognition and judgment appear intact. Alert and cooperative with normal attention span and concentration. No apparent delusions, illusions, hallucinations   Impression & Recommendations:  Problem # 1:  AXILLARY ABSCESS (ICD-682.3) Assessment Improved Pt to complete antibiotic.  Complete Medication List: 1)  Celexa 40 Mg Tabs (Citalopram hydrobromide) .Marland Kitchen.. 1 by mouth once daily 2)  Seroquel 100 Mg Tabs (Quetiapine fumarate) .Marland Kitchen.. 1 by mouth at bedtime 3)  Caltrate 600 Tabs (Calcium carbonate tabs) .... Once daily 4)  Centrum Tabs (Multiple vitamins-minerals) .... Once daily 5)  Tylenol Extra Strength  500 Mg Tabs (Acetaminophen) .... As needed 6)  Trazodone Hcl 100 Mg Tabs (Trazodone hcl) .Marland Kitchen.. 1 by mouth at bedtime 7)  Omeprazole 20 Mg Cpdr (Omeprazole) .Marland Kitchen.. 1 daily 8)  Klor-con M20 20 Meq Cr-tabs (Potassium chloride crys cr) .... One tab by mouth once daily  Patient Instructions: 1)  Please schedule a follow-up appointment as needed. 2)  Finish all of your antibiotic. 3)  If you have any redness, swelling or tenderness when you finish the antibiotic you need to schedule an appt to have the area examined again. 4)  If you develop this again in the future, have it looked at sooner rather than later.  Don't wait for it to get bad.

## 2010-04-06 NOTE — Letter (Signed)
Summary: Recall, Labs Needed  [facility]  [address]   [place], [place] [address]   Phone: [phone]  Fax: [phone]    August 21, 2009  [name] [address] [place], [place]  [address] May 28, 1970   Dear [name],   Our records indicate it is time to repeat your blood work.  You can take the enclosed form to the lab on or near the date indicated.  Please make note of the new location of the lab:   [place], 2nd floor   [place] office will call you within a week to ten business days with the results.  If you do not hear from [place] in 10 business days, you should call the office.  If you have any questions regarding this, call the office at [phone], and ask for the nurse.  Labs are due on 09/10/2009.   Sincerely,    Burnadette Peter LPN  [facility] Ph: [phone]   Fax: [phone]
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
## 2010-04-06 NOTE — Assessment & Plan Note (Signed)
Summary: FU 6WK OV/CHRONS DISEASE.GU   Primary Care Provider:  NONE @ present  Chief Complaint:  F/U Crohns.  History of Present Illness: Follow-up appt.  Seen by Dr Gala Romney 03/10/2009 w/ hx chronic SB ileocolonic Crohn's & chronic non-compliance.  Unsure PPD status..  Three times a day BMs.  Denies rectal bleeding or melena.  Denies N/V.  Denies fever or chills. No abd pain. Appetite good.  No fatigue.  Feels much better.   Humira every other friday. Last injection 1 wk ago.  WBC elevated 17K.   Dr. Truett Mainland  is gone. She is not re-established  contact with a primary care physician. She reports having a bone density study last year but I do not have those results. She has history of bipolar disorder. She's followed by psychiatrist and psychologist.  c/o URI with rhinorrhea, some cough, gettign better.  Current Problems (verified): 1)  Leukocytosis Unspecified  (ICD-288.60) 2)  Echocardiogram, Abnormal  (ICD-793.2) 3)  Hypertension  (ICD-401.9) 4)  Ankle Pain, Chronic  (ICD-719.47) 5)  Bronchitis, Acute  (ICD-466.0) 6)  Fatigue  (ICD-780.79) 7)  Sinus Tachycardia  (ICD-427.89) 8)  Neck Pain  (ICD-723.1) 9)  Neck Sprain and Strain  (ICD-847.0) 10)  Leukocytosis  (ICD-288.60) 11)  Headache, Tension  (ICD-307.81) 12)  Diarrhea  (ICD-787.91) 13)  Fitting and Adjustment of Vascular Catheter  (ICD-V58.81) 14)  Periorbital Cellulitis  (ICD-376.01) 15)  Hyperglycemia  (ICD-790.29) 16)  Aftercare, Long-term Use, Medications Nec  (ICD-V58.69) 17)  Crohn's Disease  (ICD-555.9) 18)  Contraceptive Management  (ICD-V25.09) 19)  Ileostomy  (ICD-V44.2) 20)  Herniorrhaphy, Hx of  (ICD-V45.89) 21)  Hypokalemia  (ICD-276.8) 22)  Anemia, B12 Deficiency  (ICD-281.1) 23)  Disorder, Bipolar Nos  (ICD-296.80) 24)  Arthritis, Generalized  (ICD-716.99) 25)  Peptic Ulcer Disease  (ICD-533.90) 26)  Low Back Pain  (ICD-724.2) 27)  Gerd  (ICD-530.81) 28)  Depression  (ICD-311) 29)  Asthma  (ICD-493.90) 30)   Anxiety  (ICD-300.00) 31)  Allergic Rhinitis  (ICD-477.9)  Current Medications (verified): 1)  Klor-Con 20 Meq  Pack (Potassium Chloride) .Marland Kitchen.. 1 By Mouth Once Daily 2)  Celexa 40 Mg Tabs (Citalopram Hydrobromide) .Marland Kitchen.. 1 By Mouth Once Daily 3)  Seroquel 100 Mg Tabs (Quetiapine Fumarate) .Marland Kitchen.. 1 By Mouth At Bedtime 4)  Caltrate 600  Tabs (Calcium Carbonate Tabs) .... Once Daily 5)  Centrum  Tabs (Multiple Vitamins-Minerals) .... Once Daily 6)  Cyanocobalamin 1000 Mcg/ml Soln (Cyanocobalamin) .... Intramuscularly Every Month 7)  Furosemide 20 Mg Tabs (Furosemide) .Marland Kitchen.. 1 By Mouth Once Daily 8)  Tylenol Extra Strength 500 Mg Tabs (Acetaminophen) .... As Needed 9)  Ventolin Hfa 108 (90 Base) Mcg/act Aers (Albuterol Sulfate) .... 2 Puffs Q 6 Hours As Needed 10)  Hydrocodone-Acetaminophen 10-500 Mg  Tabs (Hydrocodone-Acetaminophen) .Marland Kitchen.. 1 By Mouth Every 8 Hours As Needed Pain 11)  Spironolactone 25 Mg Tabs (Spironolactone) .Marland Kitchen.. 1 By Mouth Once Daily 12)  Bystolic 5 Mg Tabs (Nebivolol Hcl) .Marland Kitchen.. 1 By Mouth Once Daily 13)  Prilosec .Marland Kitchen.. 64m Once Daily 14)  Trazodone Hcl 100 Mg Tabs (Trazodone Hcl) ..Marland Kitchen. 1 By Mouth At Bedtime 15)  Ropinirole Hcl 1 Mg Tabs (Ropinirole Hcl) ..Marland Kitchen. 1 By Mouth At Bedtime  Allergies (verified): 1)  ! Tylox  Vital Signs:  Patient profile:   40year old female Height:      67.5 inches Weight:      200 pounds BMI:     30.97 Temp:     97.5 degrees F oral Pulse  rate:   84 / minute BP sitting:   122 / 78  (left arm) Cuff size:   regular  Vitals Entered By: Waldon Merl LPN (March 27, 9161 8:50 AM)  Physical Exam  General:  Well developed, well nourished, no acute distress. Head:  Normocephalic and atraumatic. Eyes:  Sclera clear, no icterus. Ears:  Normal auditory acuity. Nose:  No deformity, discharge,  or lesions. Mouth:  No deformity or lesions, dentition normal. Neck:  Supple; no masses or thyromegaly. Heart:  Regular rate and rhythm; no murmurs, rubs,  or  bruits. Abdomen:  Soft, nontender and nondistended. No masses, hepatosplenomegaly or hernias noted. Normal bowel sounds.without guarding and without rebound.   Msk:  Symmetrical with no gross deformities. Normal posture. Pulses:  Normal pulses noted. Extremities:  No clubbing, cyanosis, edema or deformities noted. Neurologic:  Alert and  oriented x4;  grossly normal neurologically. Skin:  Intact without significant lesions or rashes. Psych:  Alert and cooperative. Normal mood and affect.  Impression & Recommendations:  Problem # 1:  CROHN'S DISEASE (ICD-30.62)  40 year old lady with a small bowel ileocolonic Crohn's disease/  long standing noncompliance on Humira. PPD status unknown.  She has not yet established  contact with a primary care physician per our recommendations. She is stable with No GI complaints today.    Orders: Est. Patient Level III (84665)  Problem # 2:  LEUKOCYTOSIS UNSPECIFIED (ICD-288.60)  Stable at 17K.  Current URI.  Would repeat in 4 weeks.  If remains elevated, would consider further work-up.  Orders: Est. Patient Level III (99357)  Patient Instructions: 1)  I have told pt to go to Health Dept today and ask for PPD & see if she can establish care there. 2)  Pt given names of several PCP's 3)  Need Bone Density scan once PCP established. 4)  Next dose Humira will be Friday, Jan28 if we can verify negative PPD in interim. 5)  Recheck CBC in 4 wks. 6)  Call if anyproblems in interim.     Appended Document: FU 6WK OV/CHRONS DISEASE.GU Spoke w/ Dr Ola Spurr [phone]) of ID at [facility].  He advised to place PPD, if negative, proceed w/ CXR.  If positive then she obviously was able to mount a response.  Appended Document: FU 6WK OV/CHRONS DISEASE.GU Please see if pt has had her PPD placed/read yet?  Thx  Appended Document: FU 6WK OV/CHRONS DISEASE.GU Spoke with pt. She is having difficulty getting transportation to the health dept. She said she will  let us know as soon as she gets it placed/read and will bring documentation to out office.  Appended Document: FU 6WK OV/CHRONS DISEASE.GU Did pt get CBC?  Appended Document: FU 6WK OV/CHRONS DISEASE.GU NA- mailed pt letter

## 2010-04-06 NOTE — Letter (Signed)
Summary: labs  labs   Imported By: Dierdre Harness 02/02/2010 14:48:53  _____________________________________________________________________  External Attachment:    Type:   Image     Comment:   External Document

## 2010-04-06 NOTE — Progress Notes (Signed)
Summary: needs b12 shots  Phone Note Call from Patient   Summary of Call: Almyra Free from [place] office called and said that dr. Gala Romney said Evan needed b12 shots monthly.  Initial call taken by: Lenn Cal,  August 11, 2009 11:42 AM  Follow-up for Phone Call        Notify pt that I spoke with Dr Gala Romney. Her Vit B12 levels are good and she does not need the B12 injections at this time. We will need to monitor her B12 levels with blood work every 6 mos.  If her levels get low again then she will need to restart the injections. Follow-up by: Kennith Gain PA,  August 11, 2009 2:56 PM

## 2010-04-06 NOTE — Medication Information (Signed)
Summary: Visual merchandiser   Imported By: Hoy Morn 09/24/2009 09:31:14  _____________________________________________________________________  External Attachment:    Type:   Image     Comment:   External Document  Appended Document: RX Folder-humira    Prescriptions: HUMIRA PEN 40 MG/0.8ML KIT (ADALIMUMAB) Give 69m (0.833m [place] every 14 days  #90 days supp x 0   Entered and Authorized by:   LeLaureen OchsLeBernarda Caffey Signed by:   LeLaureen Ochsewis PA-C on 09/24/2009   Method used:   Printed then faxed to ...       [facility]       [address]     [place]  [address]     Ph: [phone]     Fax: [phone] RxID:   [id number]   Appended Document: [place] pharmacy

## 2010-04-06 NOTE — Letter (Signed)
Summary: [facility]  [address]   [place], [place] [address]   Phone: [phone]  Fax: [phone]     May 05, 2009   Bovina Ware [place], [place]  [address] 10-Dec-1970  Dear Ms. Cubillos,  During your last appointment, your doctor requested you have some blood work.  Our records indicate you have not had this done.  Remember it is very important to follow your doctor's instructions.   Please have this blood work done as soon as possible.  It is important that patients and their doctor work together in the management/treatment of their health care.  If you have lost or misplaced your lab orders, please give [place] a call and we will gladly give you a new copy.  If you have already had your blood work drawn, please disregard this letter.  Thank you,    Burnadette Peter LPN  [facility] Ph: [phone]   Fax: [phone]

## 2010-04-06 NOTE — Letter (Signed)
Summary: Scheduled Appointment  [facility]  [address]   [place], [place] [address]   Phone: [phone]  Fax: [phone]    June 10, 2009   Dear: [name]            DOB: [birth year]    I have been instructed to schedule you an appointment in our office.  Your appointment is as follows:   Date: April 29,2011   Time: 10:30a    Please be here 15 minutes early.   Provider: Dr Gala Romney    Please contact the office if you need to reschedule this appointment for a more convenient time.   Thank you,    [facility] Ph: [phone]   Fax: [phone]

## 2010-04-06 NOTE — Progress Notes (Signed)
Summary: humira rx  Phone Note Call from Patient Call back at Home Phone [phone]   Caller: Patient Summary of Call: pt called- had negative TB test and would like Rx for Humira sent to [facility]. please advise Initial call taken by: Burnadette Peter LPN,  June 10, 7780 2:23 PM     Appended Document: humira rx need to find out priot dosing  Appended Document: humira rx per [facility] pt is getting Humira pen 36m/ 0.867m 1 dose (0.44m244msubq q 14 days. Is this the dosage you want? Any Refills? please advise  Appended Document: humira rx Yes, Humira 71m27mbcutaneously q2weeks, QS x 90days. 0 RF.  Tell pt to keep OV, call if any problems.  Appended Document: humira rx [facility]: humira rx ok w humira dosing ; I want her to have a cbc in 6 weeks and be seen by an extender in about 8 weeks  Appended Document: humira rx lab order on file, pt already scheduled for appt

## 2010-04-06 NOTE — Medication Information (Signed)
Summary: PA for humira  PA for humira   Imported By: Burnadette Peter LPN 53/91/2258 34:62:19  _____________________________________________________________________  External Attachment:    Type:   Image     Comment:   External Document

## 2010-04-06 NOTE — Miscellaneous (Signed)
Summary: Orders Update  Clinical Lists Changes  Orders: Added new Test order of T-CBC w/Diff [phone]) - Signed

## 2010-04-06 NOTE — Letter (Signed)
Summary: Recall, Labs Needed  [facility]  [address]   [place], [place] [address]   Phone: [phone]  Fax: [phone]    Jul 17, 2009  [name] [address] [place], [place]  [address] 03-24-1970   Dear Ms. Cauble,   Our records indicate it is time to repeat your blood work.  You can take the enclosed form to the lab on or near the date indicated.  Please make note of the new location of the lab:   [place], 2nd floor   [place] office will call you within a week to ten business days with the results.  If you do not hear from [place] in 10 business days, you should call the office.  If you have any questions regarding this, call the office at [phone], and ask for the nurse.  Labs are due on 07/29/09.   Sincerely,    Burnadette Peter LPN  [facility] Ph: [phone]   Fax: [phone]

## 2010-04-06 NOTE — Medication Information (Signed)
Summary: Visual merchandiser   Imported By: Orma Flaming 10/16/2009 13:41:08  _____________________________________________________________________  External Attachment:    Type:   Image     Comment:   External Document  Appended Document: RX Folder I gave 90 day supply last month.  Appended Document: RX Folder per Rx written- rx was sent to [facility]. Should have went to [facility]. Spoke with pharmacist at [facility] and gave refill info per Rx on 09/24/2009 append.

## 2010-04-06 NOTE — Letter (Signed)
Summary: x rays  x rays   Imported By: Dierdre Harness 02/02/2010 14:50:59  _____________________________________________________________________  External Attachment:    Type:   Image     Comment:   External Document

## 2010-04-06 NOTE — Letter (Signed)
Summary: Normal Results Letter  [facility]  [address]   [place], [place] [address]   Phone: [phone]  Fax: [phone]    August 14, 2009  [name] [place], [place]  [address] [birth year]   Dear [name],   Our office has been trying to contact you.  We just wanted to let you know your white blood count is stable and we would like to recheck CBC in  one month. The lab order will be mailed to you. If you have any questions please call our office @ [phone].   Thank you,    Burnadette Peter, LPN Waldon Merl, [facility] Ph: [phone]   Fax: [phone]

## 2010-04-06 NOTE — Letter (Signed)
Summary: [facility]ERRAL[facility]  [facility]   Imported By: Zeb Comfort 05/21/2009 16:38:29  _____________________________________________________________________  External Attachment:    Type:   Image     Comment:   External Document

## 2010-04-06 NOTE — Miscellaneous (Signed)
Summary: Orders Update  Clinical Lists Changes  Problems: Added new problem of LEUKOCYTOSIS UNSPECIFIED (ICD-288.60) Orders: Added new Test order of T-CBC w/Diff [phone]) - Signed

## 2010-04-06 NOTE — Letter (Signed)
Summary: phone notes  phone notes   Imported By: Dierdre Harness 02/02/2010 14:49:49  _____________________________________________________________________  External Attachment:    Type:   Image     Comment:   External Document

## 2010-04-08 NOTE — Progress Notes (Signed)
Summary: Flare Up of Chron's Disease  Phone Note Call from Patient   Caller: Patient Reason for Call: Acute Illness Action Taken: Patient advised to go to ER Details for Reason: Pt reports that she is having a pretty significant flare of her chron's disease involving the mouth and lips Details of Complaint: ulceration of mouth and lips getting worse Details of Action Taken: Advised to go to ER for evaluation and treatment.  Summary of Call: pt advised that because she is having a significant flare of Chron's that she should go to the ER right now to have eval and treatment.  Pt says that she has hot had a flare this severe in many years. The patient verbalized clear understanding and said she would go straight over there now.  Gerlene Fee, MD, CDE, FAAFP  Initial call taken by: Irwin Brakeman MD,  March 08, 2010 2:41 PM

## 2010-04-08 NOTE — Medication Information (Signed)
Summary: HUMIRA PEN KIT 40MG  HUMIRA PEN KIT 40MG   Imported By: Hoy Morn 02/12/2010 16:30:27  _____________________________________________________________________  External Attachment:    Type:   Image     Comment:   External Document  Appended Document: HUMIRA PEN KIT 40MG    Prescriptions: HUMIRA PEN 40 MG/0.8ML KIT (ADALIMUMAB) Give 43m (0.819m [place] every 14 days  #90 daysupply x 0   Entered and Authorized by:   AnLaban EmperorP   Signed by:   AnLaban EmperorP on 02/17/2010   Method used:   Faxed to ...       [facility]       [address]     [place]  [address]     Ph: [phone]     Fax: [phone] RxID:   [id number]   Appended Document: HUMIRA PEN KIT 40MG [facility]. gave verbal ok per [facility] order.

## 2010-04-08 NOTE — Progress Notes (Signed)
  Phone Note Call from Patient   Caller: Patient Summary of Call: patient complains of swollen lips and blisters in mouth advised patient could be an allergic reaction, advised er or urgent care patient aware Initial call taken by: Baldomero Lamy LPN,  March 10, 3015 8:32 AM

## 2010-04-12 ENCOUNTER — Encounter: Payer: Self-pay | Admitting: Urgent Care

## 2010-04-14 ENCOUNTER — Encounter: Payer: Self-pay | Admitting: Gastroenterology

## 2010-04-22 NOTE — Medication Information (Signed)
Summary: OMEPRAZOLE DR 20MG  OMEPRAZOLE DR 20MG   Imported By: Hoy Morn 04/14/2010 09:10:06  _____________________________________________________________________  External Attachment:    Type:   Image     Comment:   External Document  Appended Document: OMEPRAZOLE DR 20MG    Prescriptions: OMEPRAZOLE 20 MG CPDR (OMEPRAZOLE) 1 daily  #30 x 1   Entered and Authorized by:   Laban Emperor NP   Signed by:   Laban Emperor NP on 04/14/2010   Method used:   Faxed to ...       [facility] (retail)       [address]       [place], [place]  [address]       Ph: [phone] or [phone]       Fax: [phone]   RxID:   [id number]

## 2010-04-22 NOTE — Medication Information (Signed)
Summary: OMEPRAZOLE 20MG  OMEPRAZOLE 20MG   Imported By: Hoy Morn 04/12/2010 15:04:52  _____________________________________________________________________  External Attachment:    Type:   Image     Comment:   External Document  Appended Document: OMEPRAZOLE 20MG Please let pt know due for OV 4 mo FU   Prescriptions: OMEPRAZOLE 20 MG CPDR (OMEPRAZOLE) 1 daily  #30 x 1   Entered and Authorized by:   Andria Meuse FNP-BC   Signed by:   Andria Meuse FNP-BC on 04/12/2010   Method used:   Electronically to        [facility] (retail)       [address]       [place], [place]  [address]       Ph: [phone] or [phone]       Fax: [phone]   RxID:   [id number]     Appended Document: OMEPRAZOLE 20MG pt aware of her appt on 05/11/10 @ 2pm w/KJ

## 2010-05-07 ENCOUNTER — Encounter: Payer: Self-pay | Admitting: Urgent Care

## 2010-05-11 ENCOUNTER — Encounter: Payer: Self-pay | Admitting: Urgent Care

## 2010-05-11 ENCOUNTER — Ambulatory Visit (INDEPENDENT_AMBULATORY_CARE_PROVIDER_SITE_OTHER): Payer: PRIVATE HEALTH INSURANCE | Admitting: Urgent Care

## 2010-05-11 DIAGNOSIS — K5090 Crohn's disease, unspecified, without complications: Secondary | ICD-10-CM

## 2010-05-11 DIAGNOSIS — D72829 Elevated white blood cell count, unspecified: Secondary | ICD-10-CM

## 2010-05-12 ENCOUNTER — Encounter: Payer: Self-pay | Admitting: Urgent Care

## 2010-05-17 LAB — DIFFERENTIAL
Basophils Absolute: 0 10*3/uL (ref 0.0–0.1)
Basophils Relative: 0 % (ref 0–1)
Eosinophils Absolute: 0.6 10*3/uL (ref 0.0–0.7)
Eosinophils Relative: 5 % (ref 0–5)
Lymphocytes Relative: 43 % (ref 12–46)
Lymphs Abs: 5 10*3/uL — ABNORMAL HIGH (ref 0.7–4.0)
Monocytes Absolute: 1.1 10*3/uL — ABNORMAL HIGH (ref 0.1–1.0)
Monocytes Relative: 9 % (ref 3–12)
Neutro Abs: 5 10*3/uL (ref 1.7–7.7)
Neutrophils Relative %: 43 % (ref 43–77)

## 2010-05-17 LAB — CBC
HCT: 36.4 % (ref 36.0–46.0)
Hemoglobin: 13.1 g/dL (ref 12.0–15.0)
MCH: 29 pg (ref 26.0–34.0)
MCHC: 36 g/dL (ref 30.0–36.0)
MCV: 80.5 fL (ref 78.0–100.0)
Platelets: 303 10*3/uL (ref 150–400)
RBC: 4.52 MIL/uL (ref 3.87–5.11)
RDW: 14.4 % (ref 11.5–15.5)
WBC: 11.7 10*3/uL — ABNORMAL HIGH (ref 4.0–10.5)

## 2010-05-17 LAB — BASIC METABOLIC PANEL
BUN: 4 mg/dL — ABNORMAL LOW (ref 6–23)
CO2: 24 mEq/L (ref 19–32)
Calcium: 8.6 mg/dL (ref 8.4–10.5)
Chloride: 105 mEq/L (ref 96–112)
Creatinine, Ser: 0.65 mg/dL (ref 0.4–1.2)
GFR calc Af Amer: >60 mL/min (ref >60)
GFR calc non Af Amer: >60 mL/min (ref >60)
Glucose, Bld: 75 mg/dL (ref 70–99)
Potassium: 3.1 mEq/L — ABNORMAL LOW (ref 3.5–5.1)
Sodium: 138 mEq/L (ref 135–145)

## 2010-05-18 NOTE — Miscellaneous (Signed)
Summary: Orders Update  Clinical Lists Changes  Orders: Added new Test order of T-Comprehensive Metabolic Panel ([id number]) - Signed Added new Test order of T-CBC w/Diff [phone]) - Signed
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
## 2010-05-24 ENCOUNTER — Encounter: Payer: Self-pay | Admitting: Urgent Care

## 2010-05-24 LAB — CULTURE, ROUTINE-ABSCESS

## 2010-05-25 LAB — COMPREHENSIVE METABOLIC PANEL
BUN: 4 mg/dL — ABNORMAL LOW (ref 6–23)
CO2: 31 mEq/L (ref 19–32)
Chloride: 104 mEq/L (ref 96–112)
Creatinine, Ser: 0.63 mg/dL (ref 0.4–1.2)
GFR calc non Af Amer: >60 mL/min (ref >60)
Total Bilirubin: 0.5 mg/dL (ref 0.3–1.2)

## 2010-05-25 LAB — CBC
HCT: 39.9 % (ref 36.0–46.0)
MCV: 83.5 fL (ref 78.0–100.0)
Platelets: 351 10*3/uL (ref 150–400)
RBC: 4.78 MIL/uL (ref 3.87–5.11)
WBC: 12.5 10*3/uL — ABNORMAL HIGH (ref 4.0–10.5)

## 2010-05-25 LAB — DIFFERENTIAL
Basophils Absolute: 0.1 10*3/uL (ref 0.0–0.1)
Lymphocytes Relative: 34 % (ref 12–46)
Neutro Abs: 6.6 10*3/uL (ref 1.7–7.7)
Neutrophils Relative %: 53 % (ref 43–77)

## 2010-05-25 LAB — URINALYSIS, ROUTINE W REFLEX MICROSCOPIC
Glucose, UA: NEGATIVE mg/dL
Hgb urine dipstick: NEGATIVE
Protein, ur: NEGATIVE mg/dL

## 2010-06-03 NOTE — Medication Information (Signed)
Summary: HUMIRA PEN KIT 40MG  HUMIRA PEN KIT 40MG   Imported By: Hoy Morn 05/24/2010 08:42:51  _____________________________________________________________________  External Attachment:    Type:   Image     Comment:   External Document  Appended Document: HUMIRA PEN KIT 40MG    Prescriptions: HUMIRA PEN 40 MG/0.8ML KIT (ADALIMUMAB) Give 32m (0.849m [place] every 14 days  #90 daysupply x 0   Entered and Authorized by:   KaAndria MeuseNP-BC   Signed by:   KaAndria MeuseNP-BC on 05/24/2010   Method used:   Electronically to        [facility]       [address]     [place]  [id number]     Ph: [phone]     Fax: [phone] RxID:   [id number] cancel RX to [facility] [place] [facility] [place] in [place]) Please call RX or get form for me to sign. Thanks   Appended Document: HUMIRA PEN KIT 40MG form on your desk  Appended Document: HUMIRA PEN KIT 40MG Done

## 2010-06-07 LAB — DIFFERENTIAL
Eosinophils Absolute: 0.9 10*3/uL — ABNORMAL HIGH (ref 0.0–0.7)
Eosinophils Relative: 5 % (ref 0–5)
Lymphs Abs: 5.7 10*3/uL — ABNORMAL HIGH (ref 0.7–4.0)
Monocytes Absolute: 1.1 10*3/uL — ABNORMAL HIGH (ref 0.1–1.0)
Monocytes Relative: 6 % (ref 3–12)

## 2010-06-07 LAB — COMPREHENSIVE METABOLIC PANEL
ALT: 17 U/L (ref 0–35)
AST: 17 U/L (ref 0–37)
Albumin: 3.3 g/dL — ABNORMAL LOW (ref 3.5–5.2)
CO2: 24 mEq/L (ref 19–32)
Calcium: 9.2 mg/dL (ref 8.4–10.5)
GFR calc Af Amer: >60 mL/min (ref >60)
GFR calc non Af Amer: >60 mL/min (ref >60)
Sodium: 136 mEq/L (ref 135–145)

## 2010-06-07 LAB — URINALYSIS, ROUTINE W REFLEX MICROSCOPIC
Nitrite: NEGATIVE
Specific Gravity, Urine: 1.02 (ref 1.005–1.030)
Urobilinogen, UA: 0.2 mg/dL (ref 0.0–1.0)

## 2010-06-07 LAB — PREGNANCY, URINE: Preg Test, Ur: NEGATIVE

## 2010-06-07 LAB — CBC
MCHC: 32.8 g/dL (ref 30.0–36.0)
Platelets: 353 10*3/uL (ref 150–400)
RBC: 5.02 MIL/uL (ref 3.87–5.11)
WBC: 17.6 10*3/uL — ABNORMAL HIGH (ref 4.0–10.5)

## 2010-06-07 LAB — LIPASE, BLOOD: Lipase: 20 U/L (ref 11–59)

## 2010-06-10 ENCOUNTER — Other Ambulatory Visit: Payer: Self-pay | Admitting: Family Medicine

## 2010-06-10 ENCOUNTER — Other Ambulatory Visit: Payer: Self-pay

## 2010-06-10 MED ORDER — OMEPRAZOLE 20 MG PO CPDR: 20.0000 mg | DELAYED_RELEASE_CAPSULE | Freq: Every day | ORAL | Status: DC

## 2010-06-15 LAB — BASIC METABOLIC PANEL
CO2: 25 mEq/L (ref 19–32)
Chloride: 106 mEq/L (ref 96–112)
GFR calc Af Amer: >60 mL/min (ref >60)
Glucose, Bld: 111 mg/dL — ABNORMAL HIGH (ref 70–99)
Potassium: 3.2 mEq/L — ABNORMAL LOW (ref 3.5–5.1)
Sodium: 141 mEq/L (ref 135–145)

## 2010-06-16 LAB — BASIC METABOLIC PANEL
BUN: 2 mg/dL — ABNORMAL LOW (ref 6–23)
CO2: 27 mEq/L (ref 19–32)
Chloride: 107 mEq/L (ref 96–112)
Glucose, Bld: 99 mg/dL (ref 70–99)
Potassium: 3.5 mEq/L (ref 3.5–5.1)
Sodium: 139 mEq/L (ref 135–145)

## 2010-06-16 LAB — CBC
HCT: 42.2 % (ref 36.0–46.0)
Hemoglobin: 14.3 g/dL (ref 12.0–15.0)
MCV: 85.9 fL (ref 78.0–100.0)
Platelets: 313 10*3/uL (ref 150–400)
RDW: 14.2 % (ref 11.5–15.5)

## 2010-07-20 NOTE — Assessment & Plan Note (Signed)
NAME:  [name], [name]                 CHART#:  [id number]   DATE:  12/19/2007                       DOB:  [birth year]   History of small bowel Crohn disease, long track record of  noncompliance, recently hospitalized at [facility] with nausea and upper  abdominal pain.  She was found to have multiple gastric ulcers.  The  biopsy both from mass found to be benign.  She did have H. pylori and  did complete a course of Pylera therapy and she is doing better at this  point in time.  She has seen multiple physicians over the years  including Dr. Laural Golden, Dr. Rickard Rhymes, they were at [facility], Dr.  Sharlett Iles down in [place].  Apparently, by her description, she has  had multiple nonproductive doctor-patient relationships.  She has had  innumerable no-shows here through this office and I had a lengthy  discussion with her right off the bed, if we are going to continue to  see her for Crohn disease, the importance of compliance; and if she were  to continue to be noncompliant, we would be unable to provide her with  care here.   Since her last hospitalization, Ms. Downen has done very well.  She has  been on Humira every 2 weeks for the past couple of years, initially  started by Dr. Sharlett Iles.  She is having three bowel movements daily,  semi-formed, formed, no blood per rectum.  No tenderness.  No real  abdominal pain these days, some nausea, but no vomiting.  Since October  2007, she has gained a significant amount of weight.  She has gone from  178 to 224.  She had been on Imuran through this office, but stopped  that agent on her own previously as well.  She has been recently on  Entocort 9 mg daily, on December 26, 2007, was instructed to drop back to  6 mg.  She is not taking any mesalamine preparation.  She is taking  Prilosec 20 mg orally daily for reflux symptoms.   She is status post tubal ligation and is taking Depo-Provera.   She is versed on the importance of not being  pregnant and understands  the risks.   She appears to have a resection of her small bowel by Dr. Tamala Julian several  years ago.  We were moving towards getting a small bowel follow-through  a couple years back to follow up on her Crohn's, but she never followed  through.   CURRENT MEDICATIONS:  Reviewed, see updated list in chart.   ALLERGIES:  Tylox.   PHYSICAL EXAMINATION:  GENERAL:  She really to me looks well.  VITAL SIGNS:  Weight 224, height 5 feet 8 inches, temperature 97.9, BP  110/70, pulse 88.  SKIN:  Warm and dry.  HEENT:  No scleral icterus.  CHEST:  Lungs are clear to auscultation.  CARDIAC:  Regular rate and rhythm without murmur, gallop, or rub.  ABDOMEN:  Nondistended, positive bowel sounds.  A well-healed midline  scar.  Abdomen is entirely soft and nontender without mass or  hepatosplenomegaly.  EXTREMITIES:  No edema.   ASSESSMENT:  Small bowel Crohn disease clinically appears to be in  remission, now on Humira.  A recent new diagnosis of Helicobacter pylori  associated peptic ulcer disease.  Biopsies negative for malignancy,  status post treatment for Helicobacter pylori with Pylera.  She is  tolerating Humira very well.  I do not have any recent labs.  Dr. Truett Mainland  apparently has been doing them regularly and we need to get last set for  review.   RECOMMENDATIONS:  1. I told the patient to go ahead plan and drop down on Entocort 6 mg      daily, December 26, 2007, as previously recommended.  We will      continue Prilosec 20 mg orally daily for now.  2. We will review the interim labs.  3. I feel it would be a good idea to go ahead and get an imaging study      at some point as a reference point compared to barium studies      previously done to see where we stand with morphologic changes of      small bowel related to Crohn disease.  We would like to see her      completely off Entocort.   Compliance has been a longstanding issue.  At this point, she seems to   be doing well with Humira every 2 weeks and it may be just as well that  she stay on this agent and in this particular situation, this may be a  better scenario than Imuran therapy.  We will retrieve labs from Dr.  Deirdre Pippins office.  We will plan to see this nice lady back in 6 weeks.       Bridgette Habermann, M.D.  Electronically Signed     RMR/MEDQ  D:  12/19/2007  T:  12/20/2007  Job:  [id number]   cc:   Nat Christen, M.D.

## 2010-07-20 NOTE — Op Note (Signed)
NAMECARMELITA, [name]                ACCOUNT NO.:  [id number]   MEDICAL RECORD NO.:  [id number]          PATIENT TYPE:  INP   LOCATION:  O115                          FACILITY:  [facility]   PHYSICIAN:  Caro Hight, M.D.      DATE OF BIRTH:  11-Nov-1970   DATE OF PROCEDURE:  11/27/2007  DATE OF DISCHARGE:                               OPERATIVE REPORT   REFERRING Leomia Blake:  Anselmo Pickler.   PRIMARY PHYSICIAN:  Nat Christen, M.D.   PROCEDURE:  Esophagogastroduodenoscopy with cold forceps biopsy of an  esophageal polyp and gastric ulcers.   INDICATION FOR EXAM:  [name] is a 40 year old female who presents  with history of Crohn's disease since 2004.  She was admitted with  nausea and left upper quadrant abdominal pain.  She had a CT scan which  showed some mild postsurgical changes but no evidence of acute  inflammation.  She also complains of 2 episodes of black stools that  looked like tar.   FINDINGS:  1. A 6-mm sessile esophageal polyp seen in the mid esophagus and      removed via cold forceps.  Otherwise no evidence of Barrett's mass,      erosion, ulceration or stricture.  2. Multiple 1-mm to 2-mm ulcers seen in the antrum.  No old blood or      fresh blood seen in the stomach.  Biopsies obtained via cold      forceps to evaluate for H. pylori gastritis or evidence of active      Crohn's disease as an etiology for the ulcers.  3. Normal duodenal bulb and second portion of the duodenum.   DIAGNOSES:  1. The gastric ulcers are the likely source for her left upper      quadrant pain.  No obvious source for her melena identified.  2. Esophageal polyp.   RECOMMENDATIONS:  1. Continue Entocort and prednisone.  2. Add Prilosec 20 mg daily.  3. Outpatient visit in 3 weeks with Dr. Gala Romney.  4. Could consider capsule endoscopy as an outpatient to complete the      workup for melena.   MEDICATIONS:  1. Demerol 125 mg IV.  2. Versed 7 mg IV.  3. Phenergan 12.5 mg IV.   PROCEDURE TECHNIQUE:  Physical exam was performed.  Informed consent was  obtained from the patient after explaining the benefits, risks and  alternatives to the procedure.  The patient was connected to the monitor  and placed in left lateral position.  Continuous oxygen was provided by  nasal cannula and IV medicine administered through an indwelling  cannula.  After administration of sedation, the patient's esophagus was  intubated, and the scope was advanced under direct visualization to the  second portion of the duodenum.  The patient did have episodes of  decreased oxygen saturation to approximately 85-90%.  The mild hypoxia  resolved with stimulation of the patient.  The scope was removed slowly  by carefully examining the color, texture, anatomy and integrity of the  mucosa on the way out.  The patient was recovered in endoscopy and  discharged to the floor in satisfactory condition.   PATH:  H. pylori gastritis. Esophagus: benign squamous papilloma      Caro Hight, M.D.  Electronically Signed     SM/MEDQ  D:  11/27/2007  T:  11/27/2007  Job:  [id number]   cc:   Nat Christen, M.D.

## 2010-07-20 NOTE — Consult Note (Signed)
[name], [name]                ACCOUNT NO.:  [id number]   MEDICAL RECORD NO.:  [id number]          PATIENT TYPE:  INP   LOCATION:  A323                          FACILITY:  [facility]   PHYSICIAN:  R. Garfield Cornea, M.D. DATE OF BIRTH:  [birth year]   DATE OF CONSULTATION:  DATE OF DISCHARGE:                                 CONSULTATION   REQUESTING PHYSICIAN:  [facility].   REASON FOR CONSULTATION:  Abdominal pain/history of Crohn's disease.   HISTORY OF PRESENT ILLNESS:  [name] is a 39 year old African  American female.  She has a history of Crohn's disease and was actually  seen by me in the office last week.  She has wanted to transfer her care  to the [place] area from [place].  She was previously with Dr.  Sharlett Iles.  Review of the records says she was actually discharged from  the practice.  Please see my full dictated note from office visit,  November 23, 2007.  She presented with lower abdominal pain which  radiated to her lower back and started five days ago.  She has not had  any nausea or vomiting.  She has been at her baseline as far as bowel  movements.  She has had about five nonbloody bowel movements per day  until today, which she has not had any yet.  She had a CT of the abdomen  and pelvis yesterday which showed thickening of the residual distal  ileum.  Possibly representing acute enteral chronic changes of Crohn's.  It appeared minimally more prominent than on the prior exam without  extra luminal inflammation or abscess collection.  She has a significant  history of noncompliance.  Previously, she has been maintained on Humira  through Dr. Truett Mainland' office.  She received every-2-week injections, and she  has also been on prednisone 10 mg daily at home.   Laboratory studies from today show a white blood cell count of 50.2,  hemoglobin 12.6, hematocrit 37.4, platelets 320.  Sodium 140, potassium  3.14, chloride 110, CO2 24, BUN 6, creatinine 0.74,  glucose 101.  She  had normal LFTs yesterday.  She had a urine pregnancy which was  negative.   PAST MEDICAL/SURGICAL HISTORY:  Unchanged from previous GI consult note.   MEDICATIONS PRIOR TO ADMISSION:  1. Celexa 40 mg daily.  2. Multivitamin daily.  3. Potassium chloride 20 mEq daily.  4. Cipro 100 mg nightly.  5. Singulair 10 mg daily.  6. Xanax 1 mg b.i.d.  7. Zyrtec 10 mg nightly p.r.n.  8. Trazodone 100 mg nightly.  9. Requip 2 mg nightly.  10.Prednisone 20 mg daily.  11.Vitamin B12 injections once monthly.  12.Humira subcu every 2 weeks.   ALLERGIES:  1. ULTRAM.  2. PERCOCET.   FAMILY/SOCIAL HISTORY:  As outlined in consult notes on November 23, 2007.   REVIEW OF SYSTEMS:  CONSTITUTIONAL:  Fatigue and malaise.  Denies any  fever or chills.  GI:  Denies any heartburn, dysphagia, odynophagia,  anorexia, or satiety.  Weight remains stable.   PHYSICAL EXAMINATION:  Temp 97.5, heart  rate 97, respirations 20, blood  pressure 110/82.  GENERAL:  She is a well-developed and well-nourished African American  female in no acute distress.  HEENT:  Sclerae are clear and nonicteric.  Conjunctivae are pink.  Oropharynx pink and moist without any lesions.  NECK:  Supple.  No mass or thyromegaly.  CHEST:  Heart regular rate and rhythm with normal S1 and S2 without  murmurs, clicks, rubs, or gallops.  LUNGS:  Clear to auscultation bilaterally.  ABDOMEN:  Positive bowel sounds x4.  No bruits auscultated.  Soft,  nontender, nondistended without palpable mass or hepatosplenomegaly.  No  rebound tenderness or guarding.  EXTREMITIES:  Without clubbing or edema bilaterally.   IMPRESSION:  [name] is a 40 year old African American female with a  history of Crohn's disease with mild changes on CT suggestive of a  chronic Crohn's at the residual terminal ileum with a history of  possible small stricture.  We will review the CT with Dr. Gala Romney.  I  suspect functional abdominal pain  overlay.  There are no signs of acute  flare, including diarrhea, nausea, and/or vomiting.   PLAN:  1. Review CT with Dr. Gala Romney.  2. She should continue Humira every-2-week injections.  3. Will discuss her plan of care with Dr. Gala Romney and consider Entocort      5 mg daily so we can get her off the prednisone.  4. We may need to re-evaluate endoscopically to assess for active      disease.   I would like Dr. Nat Christen for allowing [place] to participate in the  care of [name].      Vickey Huger, N.P.      Bridgette Habermann, M.D.  Electronically Signed    KJ/MEDQ  D:  11/26/2007  T:  11/26/2007  Job:  [id number]

## 2010-07-20 NOTE — H&P (Signed)
NAMELACHRISTA, [name]                ACCOUNT NO.:  [id number]   MEDICAL RECORD NO.:  [id number]          PATIENT TYPE:  INP   LOCATION:  A323                          FACILITY:  [facility]   PHYSICIAN:  Marlene Bast, MDDATE OF BIRTH:  Oct 01, 1970   DATE OF ADMISSION:  11/25/2007  DATE OF DISCHARGE:  LH                              HISTORY & PHYSICAL   CHIEF COMPLAINT:  Abdominal pain.   HISTORY OF PRESENT ILLNESS:  Mrs. [name] is a 40 year old woman with a  history of Crohn disease who presents with abdominal pain.  The pain  initially started approximately 4 days ago, it hurt, but by that night  it went away.  The next 2 days, she had no pain.  She actually had a  Gastroenterology appointment 2 days ago, but was not hurting at that  time.  Yesterday, the pain started and today it became severe.  It  really worried the patient because this a different pain than she has  had in the past.  Her Crohn disease usually causes right-sided abdominal  pain.  This pain was in bilateral lower quadrants and radiated to her  back, so she was actually scared that something new was going on.  She  does not believe that she was having any fever.  She definitely was  nauseated, did have a few episodes of vomiting, no increase in diarrhea.  If anything she had today has had fever and bowel movements than normal.  No blood noted today.   PAST MEDICAL HISTORY:  1. Crohn disease.  2. Asthma disease.  3. Bipolar disorder and history of suicide attempts in the past.  4. She has a history of restless legs syndrome as well.   PAST SURGICAL HISTORY:  Significant for removal of a small part of her  small intestine.   SOCIAL HISTORY:  She does smoke cigarettes.  No alcohol.  She is  married.  No illicit drugs.   FAMILY HISTORY:  Significant for hypertension and COPD.   MEDICATIONS ON ARRIVAL:  1. Depo-Provera, so the patient currently has no periods because of      this.  2. Celexa 40 mg daily.  3.  Multivitamin daily.  4. Potassium 20 mEq daily.  5. Seroquel 100 mg at that bedtime.  6. Singulair 10 mg daily.  7. Xanax 1 mg twice daily.  8. Zyrtec daily.  9. Trazodone 100 mg at bedtime.  10.Requip 20 mg at bedtime.  11.Prednisone, she was on 10 mg everyday until 2 days ago and then her      prednisone was increased to 20 mg daily.  12.Humira injection once every 14 days.  13.B12 injections.   The patient is out of any inhalers for her asthma.   In the emergency department on arrival, her temperature was 97.4, blood  pressure 134/81, pulse 110, respiratory rate 16, and O2 sats 98% on room  air.  The patient says when she gets sick, she usually have a drop in  her temperature not an elevation of her temperature.   REVIEW OF SYSTEMS:  CONSTITUTIONAL:  No  night sweats.  Appetite intact.  HEENT:  Negative.  CARDIOVASCULAR:  Negative.  RESPIRATORY:  She does  have occasional wheezing, but denies any shortness of breath.  GI:  She  says in the morning, she has about 4 bowel movements a day.  She does  occasionally have black stool.  INTEGUMENTARY:  She says she has an area  of hypopigmentation and a reddish area on her butt cheeks which is  secondary to Crohn's, it does not itch.  No open lesions.  MUSCULOSKELETAL:  She does have some trouble with pains in her knees.  All other systems reviewed and are negative.   PHYSICAL EXAMINATION:  GENERAL:  She is a young woman, well nourished,  well developed, and in no acute distress.  HEENT:  Normocephalic, atraumatic.  Her pupils are equal and round.  Her  sclerae nonicteric.  Her oral mucosa is moist.  NECK:  Supple.  No lymphadenopathy.  No thyromegaly.  No jugular venous  distention.  CARDIAC:  Regular rate and rhythm with no murmurs, gallops, or rubs.  LUNGS:  Clear to auscultation bilaterally.  No wheezes.  No rhonchi.  No  rales.  ABDOMEN:  Soft.  She has mild tenderness, but no rebound or guarding.  She does have bowel sounds.   No hepatosplenomegaly.  EXTREMITIES:  No evidence of clubbing, cyanosis, or edema.  She has  palpable DP pulses bilaterally.  SKIN:  Warm and dry and intact.  No rashes or open lesions.  MUSCULOSKELETAL:  Fairly good range of motion.  NEUROLOGICAL:  She is alert and oriented x3.  She has normal affect.  Her cranial nerves II through XII are intact grossly and she has 5/5  strength in her upper and lower extremities.  Sensory exam is grossly  intact in her upper and lower extremities.  She has normal muscle tone  and bulk.   DATA:  Her white count is 16.8, hemoglobin 12.8, hematocrit 37.7, and  platelet count is 334.  Urinalysis is negative for any signs of  infection.  Her urine pregnancy test is also negative.  Sodium is 139,  potassium 4.1, chloride 109, bicarb 26, glucose 144, BUN 4, creatinine  0.77.  AST and ALT within normal limits.  Lipase 34.  The patient did  have a CT scan of her abdomen and pelvis which revealed thickening of  the distal ileum which is circumferential but only minimally more  prominent than on prior exams.  Her chest x-ray reveals some scarring or  atelectasis in the right lung base.  The x-ray showed moderately dilated  loops of a central small bowel and postsurgical changes in her abdomen.   ASSESSMENT AND PLAN:  Mild Crohn's flare.  We will put the patient on IV  Solu-Medrol.  We will consult Gastroenterology and then we will put the  patient on clear liquid diet.  We will also provide supportive care.  For her bipolar disorder, restless legs syndrome, and asthma, we will  continue her outpatient medications.      Marlene Bast, MD  Electronically Signed     CVC/MEDQ  D:  11/25/2007  T:  11/26/2007  Job:  [id number]   cc:   Nat Christen, M.D.

## 2010-07-20 NOTE — Op Note (Signed)
[name], [name]                ACCOUNT NO.:  [id number]   MEDICAL RECORD NO.:  [id number]          PATIENT TYPE:  AMB   LOCATION:  DAY                           FACILITY:  [facility]   PHYSICIAN:  Jamesetta So, M.D.  DATE OF BIRTH:  [birth year]   DATE OF PROCEDURE:  03/23/2007  DATE OF DISCHARGE:                               OPERATIVE REPORT   PREOPERATIVE DIAGNOSIS:  Crohn disease, need for central venous access.   POSTOPERATIVE DIAGNOSIS:  Crohn disease, need for central venous access.   PROCEDURE:  Port-A-Cath insertion.   SURGEON:  Dr. Aviva Signs.   ANESTHESIA:  MAC.   INDICATIONS:  The patient is a 40 year old black female who presents for  Port-A-Cath insertion.  She has Crohn's disease and has multiple  frequent blood draws.  The risks and benefits of the procedure including  bleeding, infection, pneumothorax were fully explained to the patient,  who gave informed consent.   PROCEDURE NOTE:  The patient was placed in the Trendelenburg position  after the left upper chest was prepped and draped using the usual  sterile technique with Betadine.  Surgical site confirmation was  performed.   1% Xylocaine was used for local anesthesia.   A transverse incision was made below the left clavicle.  Subcutaneous  pocket was then formed.  A needle was advanced into the left subclavian  vein using the Seldinger technique without difficulty.  Guidewire was  then advanced into the right atrium under digital fluoroscopy.  An  introducer and peel-away sheath were then placed over the guidewire.  The catheter was then inserted through the peel-away sheath and the peel-  away sheath was removed.  The catheter was attached to the port, and the  port placed in a subcutaneous pocket.  Adequate position was confirmed  by fluoroscopy.  The port was then flushed with 3000 units of heparin.  The subcutaneous layer was reapproximated using a 4-0 Vicryl interrupted  suture.  The skin was  closed using a 4-0 Vicryl subcuticular suture.  Dermabond was then applied.   All tape and needle counts were correct at the end of the procedure.  The patient was transferred to PACU in stable condition.  Chest x-ray  will be performed at that time.   COMPLICATIONS:  None.   SPECIMEN:  None.   BLOOD LOSS:  Minimal.      Jamesetta So, M.D.  Electronically Signed     MAJ/MEDQ  D:  03/23/2007  T:  03/23/2007  Job:  [id number]   cc:   Nat Christen, M.D.

## 2010-07-20 NOTE — Op Note (Signed)
[name], [name]                ACCOUNT NO.:  [id number]   MEDICAL RECORD NO.:  [id number]          PATIENT TYPE:  INP   LOCATION:  3106                         FACILITY:  [facility]   PHYSICIAN:  Kary Kos, M.D.        DATE OF BIRTH:  1970/08/12   DATE OF PROCEDURE:  07/18/2008  DATE OF DISCHARGE:                               OPERATIVE REPORT   PREOPERATIVE DIAGNOSIS:  Cerebrospinal fluid leak from cervical wound  vacuum-assisted closure, status post anterior cervical diskectomy and  fusion.   PROCEDURE:  Lumbar drain placement.   ANESTHESIA:  Local.   SURGEON:  Kary Kos, MD   HISTORY OF PRESENT ILLNESS:  The patient is a 74-year female who is 2  weeks out from an ACDF with a known dural tear intraoperatively.  The  patient has had swelling around her incisions over the last few days,  got progressively worse and also developed shoulder and neck pain, and  severe discomfort with slight headache.  Incision around her neck had  swelled up and with fluid collection consistent with CSF underneath.  The patient was brought over to the hospital for lumbar drain placement.   DESCRIPTION OF PROCEDURE:  The patient's lumbar spine was prepped and  draped in usual sterile fashion working around the L4-5 area with the  patient in sitting position, leaned over a table.  This area was prepped  in usual sterile fashion using a Tuohy needle and was placed around the  L4-5 interspace.  After several times to gain access to the CSF space,  CSF was immediately expressed out of the Tuohy needle.  The catheter was  threaded in there to approximately 7.5 cm.  The needle was then removed  and the catheter was sutured in place, and wound was dressed.  Clear CSF  was easily flowing out of the catheter at the end of the procedure, and  the patient was laid back recumbent and the catheter hooked up to a  collecting system.  At the end of procedure all needle counts disposed  had been correct.  The  patient was remitted to the unit.           ______________________________  Kary Kos, M.D.     GC/MEDQ  D:  07/18/2008  T:  07/19/2008  Job:  [id number]
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
## 2010-07-20 NOTE — Assessment & Plan Note (Signed)
[name], [name]                 CHART#:  [id number]   DATE:  02/21/2008                       DOB:  1970/06/09   FOLLOWUP:  Small bowel Crohn disease.  Humira initiated by Dr. Verl Blalock in [place].  She really did have a baseline PPD at Dr.  Deirdre Pippins office and she has been on Humira now.  For number of months, she  was last seen here on 12/19/2007.  She was not able to keep her  02/14/2008 because of her child's illness.  She tells me she is doing  extremely well now on Humira.  She is giving herself her shots  __________  brings the medication in timely manner.  She is down to 6 mg  on the Entocort.  She has  __________ 3 formed brown bowel movements  daily.  She is not having any melena, really no abdominal pain, no  nausea or vomiting.  She has not had any fever or chills.  Again, her  PPD record have been negative through Dr. Deirdre Pippins office.  Her weight is  up 1 pound.  This lady was seen by Dr. Truett Mainland previously few months ago  with melena.  She underwent EGD and was found to have multiple small  ulcerated areas of gastric mucosa.  Biopsies negative for malignancy,  but positive for H. pylori.  She did take a course of Pylera therapy.  She is taking Prilosec 20 mg orally daily and really only not having any  upper GI tract symptoms whatsoever.  She does have problem with her left  ankle.  She is seeing an orthopedic surgeon apparently, although I am  not really sure what she is describing.  It sounds like she has had an  MRI, which is negative for infection.  She has also had a bone  densitometry, which has been done by Dr. Truett Mainland, negative for  osteopenia/osteoporosis, but I do not have a report in hand.   But the patient tells me that there is no evidence of osteomyelitis and  hydrocortisone therapy as being contemplated for this problem as she  describes.   MEDICATIONS:  See updated list.   ALLERGIES:  Tylox.   PHYSICAL EXAMINATION:  GENERAL:  Today, she appears  very well.  VITAL SIGNS:  Weight 225, height 5 feet 8 inches, temperature 98.1, BP  110/80, and pulse 84.  SKIN:  Warm and dry.  CHEST:  Lungs are clear to auscultation.  CARDIAC:  Regular rate and rhythm without murmur, gallop, or rub.  ABDOMEN:  Obese, positive bowel sounds, entirely soft, nontender without  appreciable mass or organomegaly.   ASSESSMENT:  Small bowel Crohn disease.  I think that we are gaining her  remission on Humira and a tapering dose of Entocort.  Hopefully, the  Entocort is nearly __________ at this point in time.   RECOMMENDATIONS:  1. I have asked her to drop the Entocort 3 mg once daily for the next      3 months.  2. I will proceed  with a CTE (CT enterography).  She got a good look      at the current state of her small bowel radiographically.  3. She is continued to get close follow up with her left ankle      problem.  She is also getting  B12 injections through Dr. Deirdre Pippins      office.  4. She was again admonished about importance of not becoming pregnant.      She is getting Depo-Provera at direction of Dr. Truett Mainland.   I will plan to see this nice lady back in 3 months and p.r.n.       R. Garfield Cornea, M.D.  Electronically Signed     RMR/MEDQ  D:  02/21/2008  T:  02/21/2008  Job:  [id number]   cc:   Nat Christen, M.D.

## 2010-07-20 NOTE — H&P (Signed)
[name], [name]                ACCOUNT NO.:  [id number]   MEDICAL RECORD NO.:  [id number]          PATIENT TYPE:  INP   LOCATION:  3106                         FACILITY:  [facility]   PHYSICIAN:  Kary Kos, M.D.        DATE OF BIRTH:  [birth year]   DATE OF ADMISSION:  07/18/2008  DATE OF DISCHARGE:                              HISTORY & PHYSICAL   ADMITTING DIAGNOSIS:  Cerebrospinal fluid leak.   PROCEDURE:  Lumbar drain placement.   HISTORY OF PRESENT ILLNESS:  The patient is a 40 year old female who is  2 weeks status post ACDF who has developed a worsened swelling around  her incision with a known dural tear intraoperatively.  So, the patient  was subsequently transferred over to the hospital.  A lumbar drain was  placed.  Past medical and past surgical history can be found in the  previously dictated note from previous admission which is only 2 weeks  ago.   Her exam is, the patient is awake and alert.  Neurologically, nonfocal  with 5/5 strength in her upper and lower extremities.  Incision around  her cervical wound was swollen with a fluid collection directly  underneath the skin consistent with CSF.   ASSESSMENT:  Admitted the patient to the hospital, placed a lumbar  drain, observed with persistent drains were about 10-15 mL an hour for  approximately 3-5 days.           ______________________________  Kary Kos, M.D.     GC/MEDQ  D:  07/18/2008  T:  07/19/2008  Job:  [id number]

## 2010-07-20 NOTE — H&P (Signed)
[name], [name]                ACCOUNT NO.:  [id number]   MEDICAL RECORD NO.:  [id number]          PATIENT TYPE:  AMB   LOCATION:  DAY                           FACILITY:  [facility]   PHYSICIAN:  Jamesetta So, M.D.  DATE OF BIRTH:  30-Sep-1970   DATE OF ADMISSION:  DATE OF DISCHARGE:  LH                              HISTORY & PHYSICAL   CHIEF COMPLAINT:  Crohn's disease, need for central venous access.   HISTORY OF PRESENT ILLNESS:  The patient is a 40 year old black female  who is referred for a Porta-cath insertion. She gets frequent blood  draws due to her Crohn's disease. She needs central venous access.   PAST MEDICAL HISTORY:  As noted above.   PAST SURGICAL HISTORY:  C-sections, hernia repair, shoulder surgery,  cholecystectomy, neck surgery.   CURRENT MEDICATIONS:  Celexa, Seroquel, __________ , Requip, Trazodone,  potassium supplements, Xanax, prednisone, Lasix, Centrum, Singulair.   ALLERGIES:  TYLOX.   REVIEW OF SYSTEMS:  The patient smokes 1/2 pack per day. She denies any  alcohol use.   PHYSICAL EXAMINATION:  GENERAL:  A well developed, well nourished, black  female in no acute distress.  LUNGS:  Clear to auscultation with equal breath sounds bilaterally.  HEART:  Examination reveals regular rate and rhythm without S3, S4, and  murmurs.   IMPRESSION:  Crohn's disease, need for central venous access.   PLAN:  The patient is scheduled for a Porta-cath insertion on March 23, 2007. The risks and benefits of the procedure including bleeding,  infection, and pneumothorax were fully explained to the patient, gained  informed consent.      Jamesetta So, M.D.  Electronically Signed     MAJ/MEDQ  D:  03/20/2007  T:  03/20/2007  Job:  [id number]   cc:   Nat Christen, M.D.

## 2010-07-20 NOTE — Discharge Summary (Signed)
NAMEMAGABY, RUMBERGER                ACCOUNT NO.:  [id number]   MEDICAL RECORD NO.:  [id number]          PATIENT TYPE:  INP   LOCATION:  3106                         FACILITY:  [facility]   PHYSICIAN:  Kary Kos, M.D.        DATE OF BIRTH:  [birth year]   DATE OF ADMISSION:  07/18/2008  DATE OF DISCHARGE:  07/26/2008                               DISCHARGE SUMMARY   ADMITTING DIAGNOSIS:  Cerebrospinal fluid leak from an anterior cervical  procedure.   POSTOPERATIVE DIAGNOSIS:  Cerebrospinal fluid leak from an anterior  cervical procedure.   HOSPITAL COURSE:  The patient is a 40 year old female who was 2 weeks  after anterior cervical diskectomy and fusion presented to the office  with large fluid collection connecting underneath the anterior cervical  wound.  The patient was admitted to the hospital, underwent lumbar drain  placement.  The patient was drained for about 6 days.  After adequate  decompression of the cervical site, drain was then discontinued.  Her  wound remained flat.  She was progressively mobilized and she had no  particular deficits.  The patient was able to be discharged home.  We  will schedule follow up as an outpatient.           ______________________________  Kary Kos, M.D.     GC/MEDQ  D:  08/27/2008  T:  08/28/2008  Job:  [id number]

## 2010-07-20 NOTE — Op Note (Signed)
[name], [name]                ACCOUNT NO.:  [id number]   MEDICAL RECORD NO.:  [id number]          PATIENT TYPE:  OIB   LOCATION:  4854                         FACILITY:  [facility]   PHYSICIAN:  Kary Kos, M.D.        DATE OF BIRTH:  1970-04-06   DATE OF PROCEDURE:  06/30/2008  DATE OF DISCHARGE:                               OPERATIVE REPORT   PREOPERATIVE DIAGNOSIS:  Cervical spondylosis at C4-5 with left-sided C5  radiculopathy and cervical stenosis C4-5 from ruptured disk.   PROCEDURE:  Exploration of fusion, C5 through C7; removal of hardware,  C5 through C7; anterior cervical diskectomy and fusion, C4-5 using a 7-  mm allograft wedge and a 25-mm Venture plate.   SURGEON:  Kary Kos, MD   ASSISTANT:  Cooper Render. Pool, MD   ANESTHESIA:  General endotracheal.   HISTORY OF PRESENT ILLNESS:  The patient is 40 year old female who has  had neck pain and progressively worsening left shoulder and arm pain  with numbness in her hand.  MRI scan showed old fusion and good  decompression of C5 through C7 with breakdown of the disk above at C4-5  causing severe stenosis and the left C5 foraminal stenosis.  The patient  failed all forms of conservative treatment, and was recommended anterior  cervical diskectomy and fusion at this level.  The risks and benefits  were explained to the patient.  She understand and agreed to proceed  forward.   DESCRIPTION OF PROCEDURE:  The patient was brought to the OR, induced  under general anesthesia, positioned supine and the neck flexed in  extension with 5 pounds of Halter traction.  The right side of the neck  was prepped and draped in the usual sterile fashion.  We localized the  appropriate level.  A curvilinear incision was made just off the midline  to the border of the sternocleidomastoid.  The superficial layer of the  platysma was dissected out and divided longitudinally.  The avascular  plane between the sternocleidomastoid and strap muscles  was developed  down to the prevertebral fascia.  Prevertebral fascia was then dissected  with Kittners.  The old Zephir plate was identified and dissected free.  All screw holes at C5 and C7 as well as central screw at C6 was  identified and the locking mechanism was disengaged.  These 4 screws  were removed.  However, the central screw was unable to be removed.  It  had had to be bitten off with a Leksell rongeur and a titanium drill,  but we just drilled down the screw head through the plate and the plate  was removed leaving the nubbin of the screw behind.  As this was  achieved, the C4 5 disk space was identified immediately above it and  longus colli was reflected laterally, and a self-retaining retractor was  placed.  Disk space was incised, drilled down to clean down and scrapped  with a __________ curette as well as a high-speed drill down the  posterior annulus and osteophyte complex.  Then, under microscope  illumination, each endplate of  C5 and C4 was under bitten exposing  densely adherent and scar in the posterior longitudinal ligament and  dural membrane.  While attempting to get into the posterior longitudinal  ligament, a small dural rent was immediately identified, so this was  packed away and attention was taken above the ligament out to the  opposite foramen on the left.  The dural ring was identified on proximal  aspect of the right C5 neural foramen.  Then, marching across to the  left, the C5 pedicle was identified.  The C5 nerve root was identified.  The dural sheath and the nerve sheath were identified and the C5 nerve  root was skeletonized, flushed to the pedicle, and decompressed the  foramen.  Then, marching from the left back to the right, the aggressive  under biting of both C4 and C5 endplates were achieved.  Until getting  over the right working around that dural rent, in which there was noted  be not a really a nice dural plane on that central and  rightward  secondary to the dense scar tissue and adhesions to the PLL.  However,  the thecal sac was decompressed centrally.  The C5 pedicle was  identified on the right and the C5 neural foramen was identified,  palpated, and explored with a nerve hook and noted to be widely patent.  Then, the endplates were scraped to prep for and to receive the bone  graft.  Then, dura seal was overlaid on top of the dural rent.  A small  piece of Gelfoam over the dura seal again, and then a bone graft was  placed.  The 7-mm bone graft had enough room posteriorly to accommodate  a small piece of Gelfoam overlying and this was felt that this would not  be stenotic to the spinal cord.  Then, after the bone graft was placed,  some additional dura seal were placed along the lateral margin of the  bone graft and no CSF egress was appreciated.  Then, the anterior margin  of the vertebral bodies were contoured.  A 25-mm Venture plate was  selected, two rescue screws were inserted in the residual holes in C5, 2  new holes were drilled in C4.  The wound was then copiously irrigated.  Meticulous hemostasis was maintained.  Then, the platysma was  reapproximated with layers of interrupted Vicryl and a running 4-0  subcuticular was placed.  Benzoin and Steri-Strips were applied.  The  patient returned to the recovery room in stable condition.  At the end  of the case, sponge and instrument counts were correct.           ______________________________  Kary Kos, M.D.     GC/MEDQ  D:  06/30/2008  T:  07/01/2008  Job:  [id number]

## 2010-07-20 NOTE — Letter (Signed)
Jul 10, 2007    Henefer Scioto  [address], [place]   RE:  [name], [name]  MRN:  [id number]  /  DOB:  Nov 08, 1970   Dear [name],   I am currently withdrawing from your gastroenterologic care because of  noncompliance and also expressed hostility towards our staff on the  phone today.  As we previously discussed, most of your care, if not all  of your care, has been administered by Dr. Truett Mainland in [place], and I  will send her a copy of this note.  We will be glad to forward you the  name of any other gastroenterologist in this area if you so need.    Sincerely,      Loralee Pacas. Sharlett Iles, MD, [place], [place]  Electronically Signed    DRP/MedQ  DD: 07/10/2007  DT: 07/10/2007  Job #: [id number]

## 2010-07-20 NOTE — Discharge Summary (Signed)
NAMEISRA, [name]                ACCOUNT NO.:  [id number]   MEDICAL RECORD NO.:  [id number]          PATIENT TYPE:  INP   LOCATION:  C623                          FACILITY:  [facility]   PHYSICIAN:  Anselmo Pickler, DO    DATE OF BIRTH:  [birth year]   DATE OF ADMISSION:  11/25/2007  DATE OF DISCHARGE:  09/22/2009LH                               DISCHARGE SUMMARY   ADMISSION DIAGNOSIS:  Crohn's disease flare.   DISCHARGE DIAGNOSES:  1. Gastric ulcers and esophageal polyp.  2. History of Crohn's disease.  3. History of chronic abdominal pain.   CONSULTATIONS:  Dr. Gala Romney and Dr. Stann Mainland, [facility].   DIAGNOSTICS:  1. EGD today which demonstrated ulcers and esophageal polyp which was      done on November 27, 2007.  2. On November 25, 2007, she had an acute abdominal series which      demonstrated moderately dilated loops of small bowel which could be      related to a history of Crohn's or a small bowel obstruction,      scarring or atelectasis of the right lung base, infection unlikely.      Stable appearance of support apparatus and cervical      instrumentation,  post-surgical changes in the abdomen.  1. On November 25, 2007, she also had a CT of the abdomen and pelvis      which demonstrated no upper abdominal fluid collection or      inflammatory process of the abdomen and of the pelvis.  She had      thickening of the residual distal ileum, could represent acute or      chronic changes of Crohn's.  It appears minimally more prominent      than the prior exam.  However, no extraluminal inflammation or      abscess collection.   HISTORY/PHYSICAL:  Her H&P was done by Dr. Alvis Lemmings, please refer to  presenting of the illness.   HOSPITAL COURSE:  She was admitted to the service of [facility] in which  GI was consulted.  She was placed on DVT and GI prophylaxis, and placed  on a clear liquid diet.  At this point in time, they decided that they   will go ahead and scope her, and do an EGD.  They also started her on  maintenance drugs for her Crohn's disease.  They recommend her to have  Prilosec on a daily basis as well as the Endocet and to follow up in 3  weeks with Dr. Gala Romney.  Also to consider a capsular study as an  outpatient.  She was also managed for asthma.  She was given breathing  treatments as needed and also inhaler.   DISCHARGE MEDICATIONS:  Medications that she will go home on include:  1. Celexa 40 mg p.o. daily.  2. Multivitamin as needed.  3. Potassium 20 mEq daily.  4. Seroquel 100 mL at bedtime.  5. Singulair 10 mL at bedtime.  6. Xanax 1 mg twice a day.  7. Zyrtec oral at bedtime.  8. Trazodone 100 mg at  bedtime.  9. Requip 2 mg at bedtime.  10.Vitamin B12 once a month.  11.Humira specialized dosing.  12.She will be sent home via GI on 9 mg daily of Endocet December 26, 2007, then 6 mg 1 p.o. daily.  13.Prednisone 10 mg p.o. daily.  14.Prilosec 20 mg p.o. daily.  15.Albuterol inhaler 1 puff q.4 h. p.r.n.   The patient was seen today.  Her vitals are stable.  Her exam is  noncontributory.  Her abdominal pain has gone.   CONDITION:  Stable.   DISPOSITION:  Will be to home.   DISCHARGE INSTRUCTIONS:  1. She is to followup.  2. She is to have a low-fat diet.  3. She is to follow up with Dr. Gala Romney in 3 weeks.  4. She is to return if symptoms worsen.   I spent longer than 30 minutes on this discharge.      Anselmo Pickler, DO  Electronically Signed     CB/MEDQ  D:  11/27/2007  T:  11/27/2007  Job:  [id number]
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
## 2010-07-23 NOTE — Discharge Summary (Signed)
NAME:  [name], [name]                          ACCOUNT NO.:  [id number]   MEDICAL RECORD NO.:  [id number]                   PATIENT TYPE:  IPS   LOCATION:  0505                                 [facility]ILITY[facility]   PHYSICIAN:  Rulon Eisenmenger, M.D.              DATE OF BIRTH:  [birth year]   DATE OF ADMISSION:  01/17/2002  DATE OF DISCHARGE:  01/23/2002                                 DISCHARGE SUMMARY   IDENTIFYING DATA:  This is a 40 year old African-American female voluntarily  admitted with a history of 4-6 weeks of progressive depressed mood,  tearfulness, irritability, guiltiness, snapping at others, arguing with  boyfriend, poor sleep, feeling that she would be better off dead, reporting  suicidal ideation, hearing voices.   ADMISSION MEDICATIONS:  Zyrtec.   ALLERGIES:  TYLOX, HYDROCODONE.   PHYSICAL EXAMINATION:  Essentially within normal limit.  There is some nasal  congestion,  Neurologically nonfocal.   ROUTINE ADMISSION LABS:  Potassium was low at 3.2, white count 12.5.   MENTAL STATUS EXAM:  [name] female, fully alert, blunted, sad affect,  pleasant and cooperative.  Speech within normal limits.  Mood was depressed  and the patient felt guilty about extreme irritability.  Thought process was  goal directed, thought content positive for vague suicidal ideation without  plan and homicidal ideation without intent.  Cognitively intact.  Judgment  and insight fair.   ADMISSION DIAGNOSES:   AXIS I:  Major depressive disorder, recurrent, severe.   AXIS II:  None.   AXIS III:  Hypokalemia, headaches, and allergic rhinitis.   AXIS IV:  Moderate problems with financial stress.   AXIS V:  32/74.   HOSPITAL COURSE:  The patient was admitted and ordered routine p.r.n.  medications, underwent further monitoring, and was optimized on medications,  encouraged to participate in individual, group and milieu therapy.  The  patient initially reported auditory hallucinations  which gradually recessed  with medication changes and reported a decrease and resolution eventually of  dangerous ideation.  From a medication standpoint, the patient was continued  on Pentasa, NuLev, Zyrtec, Ambien, Ativan.  A CT scan of the head was done  for new onset of sharp head pains.  Lexapro was started targeting  irritability and depressive symptoms, Flonase nasal spray and Singulair were  ordered.  The patient was optimized on Lexapro targeting irritability and  depressive symptoms and aftercare plan was developed and family regarded  discharge plan and treatment.  Trazodone was added to optimize sleep and the  patient was discharged with a 3-day supply of medications.   CONDITION ON DISCHARGE:  Markedly improved.  Mood was more euthymic, affect  brighter.  The patient was much less irritable.  Thought content negative  for dangerous ideation or psychotic symptoms.   DISCHARGE MEDICATIONS:  1. Lexapro 10 mg q.a.m.  2. Risperdal 0.25 mg t.i.d.  3. Trazodone 100 mg q.h.s.  4. Ambien  10 mg p.r.n. insomnia.  5. The patient was to continue with Alamine/Pentasa 1000 mg q.i.d., Levosin     0.125 mg q.h.s., Singulair 10 mg q.h.s., Flonase .05% nasal spray b.i.d.,     Zyrtec 10 mg q.a.m.   DISPOSITION:  The patient was to take medical medications as previously  prescribed, follow up with Dr. Jefm Miles on December 19 at 9:15 and Dr.  Toney Reil on December 30 at 10 a.m.   DISCHARGE DIAGNOSES:   AXIS I:  Major depressive disorder, recurrent, severe.   AXIS II:  None.   AXIS III:  Hypokalemia, headaches, and allergic rhinitis.   AXIS IV:  Moderate problems with financial stress.   AXIS V:  Global assessment of function on discharge was 50-55.                                                 Rulon Eisenmenger, M.D.    JEM/MEDQ  D:  01/30/2002  T:  01/30/2002  Job:  [id number]
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
## 2010-07-23 NOTE — Op Note (Signed)
[facility]  Patient:    MACKENZEE, BECVAR Visit Number: [id number] MRN: [id number]          Service Type: MED Location: 3A U110 01 Attending Physician:  Shaune Pollack Dictated by:   Irving Shows, M.D. Proc. Date: 05/15/01 Admit Date:  05/14/2001   CC:         Margaretmary Eddy, M.D.  Hildred Laser, M.D.   Operative Report  PREOPERATIVE DIAGNOSES: 1. Cholelithiasis. 2. Cholecystitis.  POSTOPERATIVE DIAGNOSES: 1. Cholelithiasis. 2. Acute cholecystitis.  OPERATION:  Laparoscopic cholecystectomy with cholangiogram and liver biopsies.  SURGEON:  Irving Shows, M.D.  DESCRIPTION OF PROCEDURE:  Under general anesthesia, the patients abdomen was prepped and draped in a sterile field.  A supraumbilical incision was made, and access was gained to the peritoneal cavity.  The port was placed, and the abdomen was insufflated to 16 cm of water.  The camera was placed. Gallbladder was visualized.  Under direct videoscopic vision, the right upper quadrant ports were placed uneventfully.  The gallbladder was grasped in position.  The gallbladder was very thickened and inflamed.  The cystic duct was dissected.  Once it was dissected up to the gallbladder, the duct was incised near the structure of gallbladder.  Cystic duct cholangiocatheter was placed transabdominally and positioned.  Cholangiogram was done.  This showed normal-appearing duct with normal-appearing common hepatic duct and common bile duct with free flow into the duodenum.  There was no evidence of filling defect.  The catheter was removed, and the distal ducts were clipped with four clips.  The cystic artery had multiple branches.  The two major branches were clipped with four clips and divided.  There were two other branches that were clipped with two clips and divided.  Using electrocautery, gallbladder was separated from the infrahepatic bed without difficulty.  The gallbladder  was significantly thickened, and the procedure required more time.  There was a vessel that coursed posterior and to the gallbladder, but was quite large and extended up to the fundus of the gallbladder.  This was clipped high in the gallbladder bed near the tip of the liver.  The gallbladder was excised. Hemostasis was achieved.  Irrigation was carried out until the fluid returned clear.  A TruCut needle was placed transabdominally, and two good cores of liver were removed from the right lobe of the liver.  The patient tolerated this well.  Hemostasis in the liver biopsy sites was carried out.  Inspection of the bed was unremarkable.  The gallbladder was grasped and retrieved. Further irrigation was carried out until the bed appeared normal without evidence of bleeding or bile leak.  The irrigating fluid returned clear.  CO2 was allowed to escape from the abdomen.  The camera was moved from the umbilical port to the right upper quadrant port, and the umbilical port was inspected.  There was some fat in the area, but there was no viscera.  CO2 was allowed to escape from the abdomen, and the ports were removed.  The fascia of the large incisions were closed with interrupted 0 Dexon.  The skin was closed with staples.  Dressing was placed.  The patient was awakened from anesthesia uneventfully, transferred to a bed, and taken to the postanesthesia care unit. ictated by:   Irving Shows, M.D. Attending Physician:  Shaune Pollack DD:  05/15/01 TD:  05/16/01 Job: [id number]
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
## 2010-07-23 NOTE — Consult Note (Signed)
NAMEWENDI, [name]                ACCOUNT NO.:  [id number]   MEDICAL RECORD NO.:  [id number]          PATIENT TYPE:  EMS   LOCATION:  ED                            FACILITY:  [facility]   PHYSICIAN:  Scott A. Wolfgang Phoenix, MD    DATE OF BIRTH:  04-20-1970   DATE OF CONSULTATION:  DATE OF DISCHARGE:                                   CONSULTATION   CHIEF COMPLAINT:  Abdominal pain, nausea, and diarrhea.   HISTORY OF PRESENT ILLNESS:  This is a 40 year old black female who has a  history of Crohn's disease who presents with significant abdominal pain,  discomfort and right lower quadrant discomfort; along with profuse watery  stools, moderate nausea, intermittent vomiting, no dysuria, no fever or  chills.  She is on Celexa, Imuran, Seroquel, Xanax, Singulair, Zyrtec,  albuterol, and Advair. She denies bloody stools.  She states she just feels  really run down.   PAST MEDICAL HISTORY:  As noted above, her past medical history includes  Crohn's.   PHYSICAL EXAMINATION:  GENERAL:  AND.  CHEST:  CTA.  HEART:  Regular.  Pulse normal.  BP good.  ABDOMEN:  Soft.  No guarding or rebound, but does have subjective tenderness  along the right side of the abdomen.  EXTREMITIES:  No edema.   LABORATORY DATA:  White count is 11.4 with 62% neutrophils, 26% lymphocytes.  Sodium 137, potassium 3.4, chloride 102, carbon dioxide 29, glucose 82, BUN  5, creatinine 0.7.  Liver enzymes are normal.  Amylase normal and urinalysis  overall looks good as well as sedimentation rate is at 20.   ASSESSMENT AND PLAN:  Gastroenteritis -- viral process. She was given a  liter of fluids. She was felt stable to discharged on clear liquids, and  bland foods.  In addition to this, Phenergan b.i.d. p.r.n. nausea; Darvocet  q.4h. p.r.n. pain; __________ 10 mg t.i.d. p.r.n. abdominal cramps. She is  to followup with Dr. Gala Romney whom she already has a regularly scheduled  appointment, next week; and followup with our office  p.r.n.     Scot   SAL/MEDQ  D:  03/05/2004  T:  03/05/2004  Job:  [id number]   cc:   R. Garfield Cornea, M.D.  Fax: [phone]   W. Rosemary Holms, M.D.  [address] [place] [address]  Fax: [phone]

## 2010-07-23 NOTE — Assessment & Plan Note (Signed)
[name]                         GASTROENTEROLOGY OFFICE NOTE   NAME:[name], [name]                       MRN:          [id number]  DATE:05/09/2006                            DOB:          30-Sep-1970    I have not seen [name] since July of 2007.  She is followed closely and  expertly by Dr. Nat Christen in [place].  She started her Humira,  and is on an every other week dose of this medication as per clinical  protocol at this time.  Had a good response to Humira, and is taking 40  mg every other week.  She had no side effects such as recurrent  influenza, colds, infections, any autoimmune problems, etc.  She  actually has been able to get off of her Pentasa and prednisone.  She  had been doing well until the last couple of weeks when she has had some  recurrence of loose stools.  Responded previously in July to [place]  therapy along with probiotic therapy.  Will presume bacterial overgrowth  syndrome.  She has known ileal disease with some chronic stenosis,  confirmed by CT scan of the abdomen last spring on July 04, 2005.   Additional medications at this time are Celexa 10 mg 3 times a day,  Seroquel 50 mg at bedtime, Xanax 1 mg 3 times a day, potassium  supplements, Singulair 10 mg a day, Zyrtec 10 mg a day, albuterol  inhalers, and Flonase inhalers twice a day, and Requip 0.25 mg at  bedtime.   She says her weight is stable, her appetite is good.  She denies  abdominal pain or any upper GI hepatobiliary complaints.  She does have  some diffuse arthralgias for which she takes Tylenol, but denies NSAID  abuse.  She is not smoking or abusing ethanol.   Her weight today is 195 pounds, and blood pressure is 110/70.  Pulse was  80 and regular.  I could not appreciate stigmata of chronic liver disease.  Her chest was entirely clear.  She appeared to be a regular rhythm without murmurs, gallops, or rubs.  ABDOMEN:  Benign without  organomegaly, masses, or significant  tenderness.  MENTAL STATUS:  Clear.  RECTAL EXAM:  Deferred.   ASSESSMENT:  1. Chronic ileal Crohn's disease with good response to Humira.  The      patient does have some chronic stenosis of her terminal ileum, and      may have a current bacterial overgrowth syndrome per previous      response to Xifaxan therapy.  2. Chronic B12 deficiency, and the patient says she is getting B12      replacement at Dr. Truett Mainland' office.  3. History of asthmatic bronchitis, on a variety of inhalers.  4. History of chronic anxiety and depression, on benzodiazepines and      antidepressants.  5. History of chronic metabolic disturbances, including hypoalbunemia,      and hypokalemia, which apparently have corrected and are being      followed by Dr. Truett Mainland per the patient historically.   RECOMMENDATIONS:  1. Continue Humira  injections every other week.[21m].  2. Empiric trial of metronidazole 250 mg 4 times a day, along with      Align and probiotic therapy.  3. Continue other medications as per Dr. MTruett Mainland  4. Gastrointestinal followup p.r.n. as needed.     DLoralee Pacas PSharlett Iles MD, FQuentin Ore FAGA  Electronically Signed    DRP/MedQ  DD: 05/09/2006  DT: 05/09/2006  Job #: [id number]  cc:   CNat Christen M.D.

## 2010-07-23 NOTE — Op Note (Signed)
NAME:  DAREN, DOSWELL                          ACCOUNT NO.:  [id number]   MEDICAL RECORD NO.:  [id number]                   PATIENT TYPE:  AMB   LOCATION:  DAY                                  FACILITY:  [facility]   PHYSICIAN:  R. Garfield Cornea, M.D.              DATE OF BIRTH:  1970-11-03   DATE OF PROCEDURE:  DATE OF DISCHARGE:                                  PROCEDURE NOTE   PROCEDURE:  Colonoscopy with ileoscopy biopsy.   ENDOSCOPIST:  Cristopher Estimable. Rourk, M.D.   INDICATIONS FOR PROCEDURE:  The patient is a 40 year old lady who carries a  diagnosis of small-bowel Crohn's disease recently status post 2 courses of  Remicade on prednisone.  She continues to have significant right lower  quadrant abdominal pain and diarrhea.  Recent CT scan of the abdomen failed  to demonstrate any significant bowel wall thickening in the area of the  terminal ileum.  No evidence of obstruction or fistula abscess, reactive  enlarged mesenteric nodes and postoperative changes consistent with her  history of partial resection. She is scheduled to see Dr. Rickard Rhymes  at [facility] for second opinion next month.  To  further evaluate this lady with persisting prominent symptoms I have offered  her a colonoscopy to further evaluate her neoterminal ileum as stated above,  there is a relative paucity of inflammatory changes on recent CT scan which  is concerning given her prominent symptoms.  This approach has been  discussed with the patient at length.  The potential risks, benefits, and  alternatives have been reviewed; questions answered. Please see the office  dictation of September 04, 2002, for more information.   PROCEDURE NOTE:  O2 saturation, blood pressure, pulse and respirations were  monitored throughout the entire procedure.  Conscious sedation: Versed 3 mg IV, Demerol 74 mg IV in divided doses.   INSTRUMENT:  Olympus video chip adult colonoscope.   FINDINGS:  Digital  rectal exam revealed no abnormalities.   ENDOSCOPIC FINDINGS:  The prep was adequate.   RECTUM:  Examination of the rectal mucosa including the retroflex view of  the anal verge revealed no abnormalities.   COLON:  The colonic mucosa was surveyed from the rectosigmoid junction  through the left transverse and right colon to the area of the surgical  anastomosis.  The ileocecal valve was not present.  However, there appeared  to be a surgical anastomosis with suture present and patent anastomosis. I  intubated the terminal ileum a good 10 cm.  The mucosa up in the neoterminal  ileum appeared entirely normal to me; however, just at the anastomosis there  was some effaced ileal mucosa where two 2-cm ulcers were located.  Please  see photos.  This area was biopsied, but again, the majority of the  neoterminal ileum mucosa appeared endoscopically normal.   From this level the scope was slowly withdrawn.  All previously mentioned  mucosal surfaces were again seen.  I elected to go ahead and suction out  some stool for microbiology studies as [name] has had particularly  problem symptoms with diarrhea recently. No other abnormalities were  observed.  The patient tolerated the procedure well and was reacted in  endoscopy.   IMPRESSION:  1. Normal rectum.  2. Normal residual colonic mucosa on the ileal side of the anastomosis     appeared abnormal with two 2-mm ulcers, as described above; however, the     remainder of the distal neoterminal ileum appeared endoscopically normal.     Biopsies take.  Stool aspirated for microbiology studies.   RECOMMENDATIONS:  1. For the time being continue Prevacid, Flagyl, and Pentasa.  2. Will follow up on biopsies.  3. Will follow up on stool white cells, C. difficile and culture.  4. Further recommendations to follow.  5. [name] is encouraged to plan to keep her appointment to see Dr.     Koleen Distance at [facility] the first  of next month.                                               Bridgette Habermann, M.D.    RMR/MEDQ  D:  09/20/2002  T:  09/20/2002  Job:  [id number]   cc:   Nicki Reaper A. Wolfgang Phoenix, M.D.  [address], [place]  [place] [address]  Fax: [phone]

## 2010-07-23 NOTE — Discharge Summary (Signed)
NAME:  [name], [name]                          ACCOUNT NO.:  [id number]   MEDICAL RECORD NO.:  [id number]                   PATIENT TYPE:  IPS   LOCATION:  0503                                 FACILITY:  [facility]   PHYSICIAN:  Carlton Adam, M.D.                   DATE OF BIRTH:  18-Apr-1970   DATE OF ADMISSION:  01/05/2003  DATE OF DISCHARGE:  01/09/2003                                 DISCHARGE SUMMARY   CHIEF COMPLAINT AND PRESENTING ILLNESS:  This was the second admission to  [facility]  for this 40 year old African-American female,  divorced, involuntarily admitted.  History of depression,  took a drug  overdose.  Presented in the emergency room, tearful.  Denies using any  alcohol in connection with the overdose.  Claims no recent alcohol use.  Had  stopped using her Lexapro 2 weeks prior to this admission.  Continued to  take her Xanax 1 mg 4 times a day.  Increased anhedonia, irritability,  depressed mood.  She felt that was increased to 40 was making her mood  worse, hopeless, helpless related to Crohn's disease.  Unable to do things  with her children, pursue normal activities because of her intermittent  abdominal pain.  Broke up with her boyfriend 2 weeks prior to this  admission, additional stress.   PAST PSYCHIATRIC HISTORY:  Follows up with Randye Lobo in [facility].   ALCOHOL AND DRUG HISTORY:  Denies any current use of alcohol or drugs,  history of marijuana intermittently but last used 4 months ago.   PAST MEDICAL HISTORY:  Crohn's disease and asthma.   MEDICATIONS:  1. Flonase 2 sprays daily.  2. Advair Diskus.  3. Imuran 50 mg twice a day.  4. Clarinex 1 tablet daily.  5. Pentasa for her Crohn's disease.  6. __________ Sinus 1 mg 4 times a day.  7. Lexapro 40 mg per day.   PHYSICAL EXAMINATION:   MENTAL STATUS EXAM:  Reveals a fully alert female, irritable, tearful but  cooperative.  Speech is normal rate, rhythm.  Mood was  irritable, helpless,  hopeless.  Thought process positive for suicidal thoughts but she is able to  contract for safety.  No auditory or visual hallucinations, no homicidal  ideas.  Cognition well preserved.   ADMISSION DIAGNOSES:   AXIS I:  Major depression, recurrent.   AXIS II:  No diagnosis.   AXIS III:  Asthma, Crohn's disease, sinusitis.   AXIS IV:  Moderate.   AXIS V:  Global assessment of function upon admission 38, highest global  assessment of function in past year 68.   LABORATORY DATA:  HgA1C 5.6, lipid profile within normal limits.  TSH within  normal limits.   COURSE IN HOSPITAL:  She was admitted and started on intensive individual  and group psychotherapy.  She was maintained on Seroquel 150 at night,  Lexapro 40  mg per day, Xanax 1 mg 4 times a day, Imuran 50 mg twice a day,  Pentosan 250 mg daily.  Seroquel was discontinued and so was the Lexapro and  she was placed on Xanax 0.5 4 times a day as needed for anxiety.  She was  switched to Celexa 20 mg per day and Seroquel was decreased to 50 mg at  night.  She was given Ultram for pain.  She endorsed that she was feeling  very overwhelmed, lots of stress.  She quit taking the Lexapro as she felt  that it was making her more irritable.  She worked on Radiographer, therapeutic, she  worked on Child psychotherapist and on November 4 she was in full contact with  reality.  Had dealt with the anxiety, with the pain, the loneliness.  Had  done some grief work.  Was endorsing no suicidal ideas, no homicidal ideas,  feeling much better, tolerating the medication well, therefore she was  discharged to outpatient follow-up.   DISCHARGE DIAGNOSES:   AXIS I:  Major depression, recurrent.   AXIS II:  No diagnosis.   AXIS III:  Asthma, Crohn's disease, sinusitis.   AXIS IV:  Moderate.   AXIS V:  Global assessment of function upon discharge 55.   DISCHARGE MEDICATIONS:  1. Imuran 50 mg twice a day.  2. Advair 100/50 1 twice a  day.  3. Celexa 20 mg per day.  4. Triogen D 1 daily.  5. Seroquel 25 2 at night.  6. Xanax 0.5 4 times a day as needed for anxiety.  7. Ultram 50 every 6 hours as needed for pain.   DISPOSITION:  Follow up with Dr. Randye Lobo in [facility].                                               Carlton Adam, M.D.    IL/MEDQ  D:  02/05/2003  T:  02/06/2003  Job:  [id number]

## 2010-07-23 NOTE — Op Note (Signed)
[name], [name]                ACCOUNT NO.:  [id number]   MEDICAL RECORD NO.:  [id number]          PATIENT TYPE:  AMB   LOCATION:  DAY                           FACILITY:  [facility]   PHYSICIAN:  Carole Civil, M.D.DATE OF BIRTH:  28-Nov-1970   DATE OF PROCEDURE:  04/02/2004  DATE OF DISCHARGE:                                 OPERATIVE REPORT   PREOPERATIVE DIAGNOSES:  Glenoid fracture, scapular fracture, rotator cuff  syndrome, left shoulder.   POSTOPERATIVE DIAGNOSIS:  1.  Glenoid fracture, scapular fracture, rotator cuff syndrome, left      shoulder.  2.  Loose body. glenohumeral joint.   PROCEDURES:  Diagnostic shoulder arthroscopy, removal of loose body,  arthroscopic subacromial decompression.   SURGEON:  Aline Brochure,   ASSISTANT:  Olene Floss.   ANESTHETIC:  General.   OPERATIVE FINDINGS:  There was a loose body in the glenohumeral joint.  There was glenohumeral joint synovitis.  There was fraying of the rotator  cuff from the articular side.  There were degenerative changes, grade 1  diffuse, of the glenoid and humerus.  In the subacromial space, there was  significant amount of bursitis and inflammation.  There was also fraying of  the rotator cuff on the bursal side as well.   The patient was seen in the preop holding area, her left shoulder was marked  as the surgical site.  She was given Ancef.  Surgeon countersigned the  marking of the surgical site as in the left shoulder.  She was taken to the  operating room for general anesthesia, had a sterile prep and drape.  In a  modified beach-chair position, we did a time-out.  We confirmed that the  left shoulder was the operative site and then she was having an arthroscopic  subacromial decompression.  We confirmed her identity as [name].   Through a posterior portal, we did a diagnostic arthroscopy, debrided the  glenohumeral joint space, removed the loose body, placed the scope the  subacromial space,  did a bursectomy, acromioplasty and a light debridement  of the rotator cuff.  The joint and subacromial space were irrigated.  We  closed the portals with 3-0 Prolene.  We injected  30 mL of Marcaine.  We placed her in a Cryo/Cuff sling and took and then  extubated her and took her to the recovery room in stable condition.  Postop  plan is for follow-up on Monday.  We will also start her physical therapy on  Tuesday.      SEH/MEDQ  D:  04/02/2004  T:  04/02/2004  Job:  [id number]
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
## 2010-07-23 NOTE — H&P (Signed)
NAME:  [name], [name]                          ACCOUNT NO.:  [id number]   MEDICAL RECORD NO.:  [id number]                   PATIENT TYPE:  IPS   LOCATION:  0503                                 FACILITY:  [facility]   PHYSICIAN:  Rulon Eisenmenger, M.D.              DATE OF BIRTH:  March 25, 1970   DATE OF ADMISSION:  01/05/2003  DATE OF DISCHARGE:                         PSYCHIATRIC ADMISSION ASSESSMENT   IDENTIFYING INFORMATION:  This is a 40 year old African-American female who  is divorced.  This is an involuntary admission.   HISTORY OF PRESENT ILLNESS:  This patient with a history of depression took  a multi-drug overdose, but will not divulge exactly what it was she took.  She presented in the Emergency Room tearful.  She denies using any alcohol  in conjunction with the overdose and has had no recent alcohol use.  She had  stopped using her Lexapro approximately 2 weeks ago, but continued to take  her Xanax 1 mg p.o. q.i.d.  She endorses increased anhedonia, increased  irritability, and depressed mood.  She also had felt that the Lexapro, as it  was increased to 40 mg daily, may have been making her mood worse.  The  patient reports that she feels helpless and hopeless related to her Crohn's,  which she feels has taken over her life.  She feels unable to do things with  her children or pursue normal activities because of her intermittent  abdominal pain.  She also broke up with her boyfriend 2 weeks ago, which is  an additional stressor.  She endorses suicidal ideation by attempted  overdose and has had auditory hallucinations telling her that life just  isn't worth it.Marland Kitchen   PAST PSYCHIATRIC HISTORY:  The patient is followed by Dr. Stark Jock  at the [facility].  This is her second [facility] admission.   SOCIAL HISTORY:  This is a divorced African-American female who has 3  children, ages 53, 59, and 64 years of age.  She currently lives with her  grandmother and her  children.  She also receives assistance from her mother  by way of childcare and other supportive assistance.  She has been divorced  approximately 1 year from her first marriage.  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  No legal problems.   FAMILY HISTORY:  Unclear.   ALCOHOL AND DRUG HISTORY:  The patient denies any current use of alcohol or  street drugs.  She does report a past history of marijuana abuse, using it  intermittently, but last use 4 months ago.   MEDICAL HISTORY:  The patient is followed by Dr. Wolfgang Phoenix who is her primary  care physician.  The patient is also followed for gastroenterology by Dr.  Delanna Ahmadi in [place], [place], and Dr. Koleen Distance at [facility].  1. Asthma.  2.  Crohn's disease.   PAST MEDICAL HISTORY:  Remarkable for a previous history of ileostomy to  control her Crohn's disease.  She also was recently treated for a sore  throat with a round of azithromycin.   MEDICATIONS:  1. Flonase 2 sprays each nostril daily.  2. Advair Diskus, dose unclear.  3. Imuran 50 mg b.i.d.  4. Clarinex 1 tablet daily.  5. Pentasa, which she has previously taken for her Crohn's, but is not     taking now.  6. Entocort, she states that she is supposed to be on.  Does not know dose.  7. Xanax 1 mg q.i.d.  8. Lexapro 40 mg p.o. daily, has not used for at least 2 weeks.   The patient reports that she has not been taking several of her medications  regularly because she cannot afford to get the prescriptions filled and she  did not have a Medicaid waiver to help her pay for her prescriptions.   MENTAL STATUS EXAM:  This is a fully alert female with an irritable affect  with tearfulness.  She is cooperative.  Speech is normal.  Mood is  irritable, helpless, and hopeless.  Thought processes positive for suicidal  thoughts, but she is able to contract for safety here on the  unit.  No  auditory hallucinations today.  No homicidal ideation or visual  hallucinations.  Some thought agitation is present.  Cognitively she is  intact and oriented x 3.  Paranoia is unclear.  Intelligence average.  Insight poor.  Judgment and impulse control questionable.   AXIS I:  Major depressive disorder, recurrent, severe with psychosis.   AXIS II:  Deferred.   AXIS III:  1. Asthma.  2. Crohn's disease.  3. Rhinosinusitis.   AXIS IV:  Severe problems with medical noncompliance and with medical  management of her Crohn's.   AXIS V:  Current 38.  Past year 21.   PLAN:  Involuntarily admit the patient with q. 15 minute checks in place.  The patient was involuntarily petitioned in the Emergency Room when she  refused to agree to come voluntarily.  She is able to promise safety on the  unit.  We are going to start her back on Celexa 20 mg p.o. daily, rather  than the Lexapro.  She feels that she does not want to go back on the  Lexapro, that she is not willing to consider it any further.  We explained  about the relationship of Celexa and Lexapro and that the Celexa is somewhat  milder.  She is willing to start with this at this point.  We are going to  ask the case manager to please contact [facility] to follow up with her  Thursday appointment that is scheduled.  Meanwhile we are going to start her  back also on Seroquel 50 mg p.o. q.h.s. and start her on Advair Diskus  100/50 one puff b.i.d.  We have attempted to call the Pharmacy in [place]  regarding her Entocort cortisone for her Crohn's.  They state that it has  not been filled for more than a year, so we have placed a call to [facility] to inquire about her current medications.  She says that she  has not been taking several of her medications regularly because she could  not afford them and did not have active Medicaid for them.  So, we are not even clear if she has been taking her Crohn's medications  regularly.  We have explained the plan of care to the patient, and she is in agreement.   ESTIMATED LENGTH OF STAY:  Five days.    Margaret A. Laurita Quint                   Rulon Eisenmenger, M.D.   MAS/MEDQ  D:  01/06/2003  T:  01/07/2003  Job:  [id number]

## 2010-07-23 NOTE — H&P (Signed)
[name], Michele                ACCOUNT NO.:  [id number]   MEDICAL RECORD NO.:  [id number]          PATIENT TYPE:  INP   LOCATION:  A336                          FACILITY:  [facility]   PHYSICIAN:  Audria Nine, M.D.DATE OF BIRTH:  [birth year]   DATE OF ADMISSION:  06/18/2005  DATE OF DISCHARGE:  LH                                HISTORY & PHYSICAL   ADMITTING DIAGNOSES:  1.  Acute abdominal pain.  2.  Acute exacerbation of Crohn's disease.  3.  Dehydration.  4.  Manic depression.   PRIMARY CARE PHYSICIAN:  Dr. Ned Card.   CHIEF COMPLAINT:  Abdominal pain.   HISTORY OF PRESENT ILLNESS:  She is a 40 year old African-American female  who presents with abdominal pain.  She describes the pain as mostly on the  right flank.  The pain initially occurred yesterday in the morning.  The  patient has been persistent.  She describes it as sharp, intermittent but  now persisting, burning and occasionally colicky.  The pain is nonradiating.  She describes it as a 7/10 at its worst.  She denies any fevers or chills.  No nausea or vomiting.  The patient is a really a poor historian and seems  to rely more on her husband to tell her how she is doing.  The patient has  had diarrhea for several years, ever since Crohn's disease was diagnosed,  but the episodes have not been more than usual.  The patient's Crohn's  disease is felt to be worsening due to poor compliance, and there is a plan  to start her on Humira soon.  She actually had a PPD done last week which  was negative.  Evaluation in the emergency room revealed ongoing pain  despite analgesia.  Plan is to control her pain and hydrate her.   REVIEW OF SYSTEMS:  Ten point review of systems otherwise negative except as  mentioned in history of present illness.   PAST MEDICAL HISTORY:  1.  Asthma.  2.  Bipolar disorder.  3.  Crohn's disease.   MEDICATIONS:  1.  She is on albuterol inhaler as needed.  2.  Celexa 20 mg p.o.  t.i.d.  3.  Lasix 40 mg p.o. once a day.  4.  Multivitamin one p.o. daily.  5.  Potassium chloride one p.o. daily.  6.  Seroquel 10 mg p.o. once a day.  7.  Xanax twice a day.  8.  Zyrtec at bedtime.  9.  Caltrate.   ALLERGIES:  SHE REPORTS ALLERGIES TO ULTRAM AND TYLOX.   FAMILY HISTORY:  Positive for hypertension, COPD in parents.   SOCIAL HISTORY:  The patient smokes about two packs a day.  She denies any  illicit drug use.  Lives at home with her boyfriend.  She has four children.  Does not drink alcohol.   PHYSICAL EXAMINATION:  Conscious, alert, comfortable, not in acute distress.  The patient is poorly kempt.  VITAL SIGNS:  On arrival in the emergency room, blood pressure was 120/95,  pulse of 101, respirations 20, temperature 98.3 degrees Fahrenheit.  HEENT EXAM:  Normocephalic, atraumatic.  Oral mucosa was moist with no  exudates.  NECK:  Supple.  No JVD, no lymphadenopathy.  LUNGS:  Clear clinically with good air entry bilaterally.  HEART:  S1 S2 regular.  No S3, S4, gallops or rubs.  ABDOMEN:  Soft, nontender, bowel sounds positive, no mass palpable.  EXTREMITIES:  No edema, no induration.  CENTRAL NERVOUS SYSTEM EXAM:  Grossly intact with no focal deficit.   LABORATORY/DIAGNOSTIC STUDIES:  White blood cell count was 18.1, hemoglobin  13.2, hematocrit 39.4, platelet count was 323 with no left shift.  Sodium  136, potassium 3.6, chloride 101, CO2 28, glucose 76, BUN 7, creatinine 0.8,  total bilirubin 0.3, AST 13, ALT 19, albumin 3.1, calcium 9.0, lipase 31.   Urinalysis was negative.   CT of the abdomen and pelvis revealed thickening in the terminal ileum  consistent with her history of Crohn's disease.   ASSESSMENT AND PLAN:  110.  A 40 year old African-American female with history of Crohn's disease      who has been admitted now with what appears to be probably very mild      exacerbation.  Plan is to start on Cipro 400 mg IV every 12 and Flagyl      500 mg IV  every eight.  She is already on a fair amount of prednisone at      about 60 mg a day and I am very concerned with this high dose. I will      put her on this for now but request GI consult to see if we need to      decrease the strength of this prednisone or just do it over the next      couple of days while she is in this mild flare-up.  Will hydrate her and      control pain as needed with Dilaudid.  2.  Deep vein thrombosis prophylaxis with Lovenox.   She will be on observation admission and this patient can be discharged over  the next day or two.   Will replace potassium as needed.      Audria Nine, M.D.  Electronically Signed    AM/MEDQ  D:  06/18/2005  T:  06/18/2005  Job:  [id number]

## 2010-07-23 NOTE — Discharge Summary (Signed)
[facility]  Patient:    [name], [name] Visit Number: [id number] MRN: [id number]          Service Type: MED Location: 3A [id number] Attending Physician:  Rogene Houston Dictated by:   Neil Crouch, P.A. Admit Date:  05/14/2001 Discharge Date: 05/18/2001   CC:         Margaretmary Eddy, M.D.  Irving Shows, M.D.   Discharge Summary  DATE OF BIRTH:  [birth year].  ADMISSION DIAGNOSES: 1. Cholelithiasis, cholecystitis, transaminitis. 2. Small bowel Crohns disease. 3. Mild hypokalemia.  DISCHARGE DIAGNOSIS: 1. Cholecystitis, cholelithiasis, status post laparoscopic cholecystectomy    with negative cholangiogram and liver biopsy. 2. Transaminitis with macrosteatosis on liver biopsy. 3. Small bowel Crohns disease. 4. Hypokalemia corrected.  SERVICE:  Hildred Laser, M.D.  CONSULTATIONS:  Dr. Irving Shows on May 14, 2001.  PROCEDURES:  May 15, 2001, by Dr. Irving Shows, laparoscopic cholecystectomy with cholangiogram and liver biopsy.  HISTORY OF PRESENT ILLNESS:  The patient is a 40 year old female with a history of small bowel Crohns disease status post resection in April 2002. She was felt to be in remission post being on entocort taper in January of 2003 for a flair.  She contacted the office on May 08, 2001, with complaints of nausea, vomiting, and lose stools, and initiated another course of entocort.  She began feeling better as far as her Crohns disease, however, developed acute mid epigastric cramping radiating downward with return of lose non bloody stools shortly after eating a hamburger at [employer] several days before admission.  She denied any nausea or vomiting, fever, heartburn, dysphagia.  In the emergency department she was found to have albumin of 3.2, SGOT 51, SGPT 79, alk. phos. 94, total bilirubin 0.5, amylase 55, and lipase 22. Abdominal ultrasound was obtained which revealed cholelithiasis with mild gallbladder  wall thickening.  She was felt to have acute cholecystitis and therefore was admitted for further evaluation.  HOSPITAL COURSE:  On day #1 of hospitalization, Dr. Tamala Julian, was consulted.  She was felt to have acute cholecystitis and also had cholelithiasis.  Plans were made for her to have a laparoscopic cholecystectomy the following day.  The plan was for intraoperative cholangiogram as well as a liver biopsy per Dr. Otelia Limes request.  Rocephin 1 gram IV q.d. was initiated.  She was started on Flagyl 10 to 15 mg t.i.d.  Home medications were continued as far as pentasa 250 mg 4 tablets q.i.d. and entocort 3 mg 3 tablets p.o. q.d.  Her mild hypokalemia was corrected prior to surgery.  On day #2 she underwent laparoscopic cholecystectomy with intraoperative cholangiogram.  This revealed no evidence of a retained intraductal calculi.  Liver biopsy was also obtained.  Pathology revealed mild chronic inflammation with focal macrosteatosis.  There was no fibrosis identified.  AS the patient had worseniNG diarrhea plans were made for stool to be checked for C. difficile as well as WBC, however, diarrhea resolved prior to collection.  On day #3 of hospitalization she complained of upper abdominal pain post surgery.  Her pain was managed with IV Demerol p.r.n.  She was eating very little.  It was felt that she needed to continue her stay in the hospital for adequate pain control, and Dr. Tamala Julian, saw her that evening, and agreed that she should at least stay until the following morning.  On day #4 she continued to have severe abdominal pain.  She really began describing abdominal spasms in her anterior  mid abdomen.  It was felt that this was probably secondary to pneumoperitoneum during surgery.  Her Crohns appeared to be stable.  Pain management was continued.  On the day of discharge the patient began feeling better.  She continued to have muscle spasms, however, they were less intense.  She had  had two lose stools the day prior to discharge.  She was felt to be stable for discharge.  PHYSICAL EXAMINATION:  Discharge physical examination.  CHEST:  Clear.  LUNGS:  Clear to auscultation.  CARDIAC:  Reveals regular rate and rhythm.  ABDOMEN:  Positive bowel sounds.  Less distended.  Mildly tender in the diffuse mid abdomen.  No rebound tenderness or guarding.  LABORATORY DATA:  On May 14, 2001, WBC 9.9, hemoglobin 14.1, hematocrit 41.3, platelets 317,000.  Sodium 136, potassium 3.3, chloride 103, BUN 6.0, creatinine 0.8, total protein 7.2, albumin 3.2, SGOT 51, SGPT 79, alk. phos. 94, total bilirubin 0.5, amylase 55, lipase 22.  On May 16, 2001, WBC 11.6.  On May 15, 2001, sodium 139, potassium 4.3., BUN 4, creatinine 0.7, total protein 5.9.  On May 16, 2001, total protein 6.2, albumin 2.7, SGOT 46, SGPT 56, alk. phos. 70, total bilirubin 0.6.  DISCHARGE CONDITION:  Good.  DISPOSITION:  Discharged to home.  MEDICATIONS: 1. She will continue her pentasa 1 gram q.i.d. 2. Entocort EC 9 mg daily to initiate taper as planned. 3. Bentyl 10 mg p.o. t.i.d. prescription written for #100 with 0 refills. 4. Imodium 2 mg up to 4 tablets a day for diarrhea. 5. Talacen #30, to take 1 p.o. q.4h. p.r.n. pain with 0 refills.  INSTRUCTIONS:  She will follow up with Dr. Tamala Julian, next week if planned. She will follow up with Dr. Laural Golden next week as planned.  Prior to office visit with Dr. Laural Golden she will obtain LFTs.  She was advised to notify the Dr. Tamala Julian, or Dr. Laural Golden immediately if abdominal pain worsens or if she develops fever. Dictated by:   Neil Crouch, P.A. Attending Physician:  Rogene Houston DD:  05/18/01 TD:  05/19/01 Job: [id number]
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
## 2010-07-23 NOTE — Op Note (Signed)
[facility]  Patient:    [name], [name] Visit Number: [id number] MRN: [id number]          Service Type: END Location: DAY Attending Physician:  Bridgette Habermann Dictated by:   Garfield Cornea, M.D. Proc. Date: 09/04/01 Admit Date:  09/04/2001 Discharge Date: 09/04/2001   CC:         Sallee Lange, M.D.   Operative Report  PROCEDURE:  Colonoscopy with ileoscopy and biopsy.  ENDOSCOPIST:  Garfield Cornea, M.D.  INDICATION FOR PROCEDURE:  The patient is a 40 year old lady with well-documented small bowel Crohns disease, status post ileal resection and ileal colostomy by Dr. Irving Shows last year and has done well except she has had a flare with diarrhea recently and some hematochezia.  Colonoscopy is now being done to further evaluate her symptoms.  This approach has been discussed with the patient previously.  Potential risks, benefits and alternatives have been reviewed and questions answered; she is agreeable.  Please see my dictated H&P for more information.  I feel she is low risk for conscious sedation with Versed and Demerol.  DESCRIPTION OF PROCEDURE:  The patient was placed in the left lateral decubitus position.  O2 saturation, blood pressure, pulse and respirations were monitored throughout the entire procedure.  CONSCIOUS SEDATION:  Versed 4 mg IV and Demerol 75 mg IV in divided doses.  INSTRUMENT:  Olympus video chip colonoscopy.  FINDINGS:  Digital rectal exam revealed no abnormalities.  ENDOSCOPIC FINDINGS:  Prep was good.  Rectum:  Examination of the rectal mucosa including a retroflexed view of the anal verge revealed only anal canal/internal hemorrhoids.  Colon:  Colonic mucosa was surveyed from the rectosigmoid junction all the way to the anastomosis with the neo-terminal ileum.  The remnant of the ileocecal valve was identified; please see photos.  Terminal ileum was intubated good at 30 cm.  There are multiple 3- to 5-mm  ulcers scattered throughout the distal ileal mucosa, see photos; these were biopsied.  Also, stool residue was aspirated for microbiology studies.  From this level, the scope was slowly withdrawn and all previously mentioned mucosal surfaces were again seen.  No other abnormalities were noted.  The patient tolerated the procedure well and was reacted at endoscopy.  IMPRESSION: 1. Anal canal/internal hemorrhoids, otherwise, normal rectum. 2. Normal-appearing residual colon, neo-terminal ileum inspected, 3- to    5-mm ulcers scattered throughout the neo-terminal ileum, biopsied, stool    suctioned and sample taken. 3. I suspect the patients diarrhea is more on the basis of a flare of Crohns    (which is getting better) instead of an infectious process. 4. Moreover, her hematochezia is more likely secondary to benign anorectal    bleeding in the setting of diarrhea than the small ulcers in the terminal    ileum (which would likely reflect persistent Crohns disease).  RECOMMENDATIONS: 1. Continue Pentasa 1 g orally q.i.d. 2. Continue Entocort taper. 3. Ten-day course of Anusol suppositories -- one per rectum at bedtime. 4. Follow up on stool studies and biopsies. 5. Followup visit with [place] in three to four weeks. Dictated by:   Garfield Cornea, M.D. Attending Physician:  Bridgette Habermann DD:  09/04/01 TD:  09/06/01 Job: [id number]

## 2010-07-23 NOTE — Consult Note (Signed)
[name], [name]                ACCOUNT NO.:  [id number]   MEDICAL RECORD NO.:  [id number]          PATIENT TYPE:  INP   LOCATION:  A336                          FACILITY:  [facility]   PHYSICIAN:  Caro Hight, M.D.      DATE OF BIRTH:  01-Oct-1970   DATE OF CONSULTATION:  06/18/2005  DATE OF DISCHARGE:  06/19/2005                                   CONSULTATION   REQUESTING PHYSICIAN:  Audria Nine, M.D.   REASON FOR CONSULTATION:  Abdominal pain.   HISTORY OF PRESENT ILLNESS:  [name] is a 40 year old African-American  female who has been seen in the emergency department at least monthly since  January 2007 secondary to abdominal pain.  She has a past medical history of  Crohn's disease, diagnosed in 2001.  Her course has been complicated by a  small bowel resection in 2001.  She does have a history of medication  nonadherence.  She had a small bowel follow-through in March 2007 which  showed a 15 cm long distal small bowel narrowing. The segment  was  irregular, extended to the anastomosis.  She reports being seen by Dr.  Sharlett [name] in [place], and treatment was discussed.  She is going to be  started on Humira as an outpatient.  Currently, she has 6-8 bowel movements  a day.  Her bowel movements are loose to watery.  Usually with her flares,  she has 8-16 bowel movements a day.  She rarely sees blood.  She currently  is complaining of abdominal pain on the right & left upper quadrant, right  flank, and right lower quadrant.  She describes it as sharp and achy.  It is  associated with nausea but no vomiting.  She denies any sores in her mouth,  or pain with swallowing.  She does have joint pain in her knees bilaterally.  Her joint pain is usually worse with flares.  It is difficult to tell  whether or not her joint pain is any worse.  The pain does not radiate.  Things that make the pain worse are bolonga, peaches, fish sandwiches, and  chicken salad.  She reports being off  meds for 1-1/2 years.  She has been  seen at other institutions and has intermittently been on therapy since  2001.  She is currently on steroid taper since March 2007.  She also takes  Pentasa.   Her past medical history includes asthma, anxiety, depression, and  headaches.  Her past surgical history includes cholecystectomy,  appendectomy, tubal ligation, and hernia repair.  She is allergic to TYLOX.   CURRENT MEDICATIONS:  1.  Cipro 400 mg twice daily.  2.  Lovenox 40 mg once daily.  3.  Flagyl 500 mg 3 times a day.  4.  Protonix.  5.  Prednisone 60 mg daily.  6.  Dilaudid p.r.n.   She has no family history of colon cancer or colon polyps.  She denies any  alcohol use or recreational drug use.  She does smoke.  She is married and  has three children. Her review of systems reveals  a temperature of 97.8 at  home.  She complains of cold intolerance.  She usually is 170-175 pounds.  She is currently having 6-7 bowel movements per day, no blood.  She states  that the pain in her abdomen has increased for the last 2-3 weeks since she  has been on steroids.  Her review of systems was as previously mentioned or  as per the HPI, otherwise all systems are negative.   PHYSICAL EXAMINATION:  VITAL SIGNS:  She is afebrile and hemodynamically  stable.  GENERAL:  She is alert and oriented in no apparent distress.  HEENT:  Atraumatic and normocephalic.  Pupils are equal and reactive to  light.  Mouth:  No oral lesions.  NECK:  Full range of motion and no  lymphadenopathy.  LUNGS:  Clear to auscultation bilaterally.  CARDIOVASCULAR:  Regular rhythm.  No murmur.  Normal S1 and S2.  ABDOMEN:  Bowel sounds are present, soft.  Nondistended.  Obese.  Mild tenderness to  palpation in the right lower quadrant and in the right flank.  No rebound or  guarding.  No hepatosplenomegaly.  EXTREMITIES:  Without clubbing, cyanosis  or edema.  NEUROLOGIC: She has no focal neurological deficits.   LABS:  White  count 18.1, 64% segs, hemoglobin 13.2, platelets 323.  Albumin  3.1.  Liver enzymes normal.  Creatinine 0.8, lipase 31.  UA negative.   RADIOGRAPHIC STUDIES:  She had a CT scan which showed a thickened terminal  ileum.  I reviewed this scan personally and discussed it with Dr. Nolon Nations.  We agreed that the CT scan is improved since November 2006.  She has  no evidence of abscess.   ASSESSMENT:  [name] is a 40 year old female with mildly active Crohn's  disease.  She likely has a component of functional abdominal pain.  The  elevated white count is somewhat concerning but perhaps is a stress  reaction.  Thank you for allowing me to see [name] in consultation.  My  list of recommendations follow:  I recommend changing her prednisone back to  20 mg daily.  I would use narcotics sparingly.  I would continue the Cipro  and Flagyl for seven days.  I would obtain stool studies for C. diff and  routine stool culture.  She may follow up with [name] and [name] as  an outpatient.      Caro Hight, M.D.  Electronically Signed     SM/MEDQ  D:  06/20/2005  T:  06/21/2005  Job:  [id number]

## 2010-07-23 NOTE — Consult Note (Signed)
[facility]  Patient:    [name], [name] Visit Number: [id number] MRN: [id number]          Service Type: MED Location: 3A [id number] Attending Physician:  Shaune Pollack Dictated by:   Irving Shows, M.D. Admit Date:  05/14/2001                            Consultation Report  CC:  Hildred Laser, M.D.      Margaretmary Eddy, M.D.  REASON FOR CONSULTATION:  [age]-year-old female admitted for evaluation of recurrent abdominal pain, nausea, vomiting and diarrhea.  HISTORY OF PRESENT ILLNESS:  Patient relates onset of symptoms about one week ago when she developed nausea, vomiting and diarrhea.  She was seen by her GI specialist and was treated for suspected exacerbation of Crohns.  She was given _____ and temporarily felt better, but after eating three days ago she had return of symptoms and had more midepigastric pain, which radiated to both flanks with follow up of diarrhea.  She has been persistently symptomatic since that time.  She states that each times that she eats she has exacerbation of symptoms.  The patient has known gallbladder disease and was found to have gallstones at the time of small bowel resection for abscess for Crohns disease in April of 2002.  She was admitted and started on treatment.  She had a gallbladder ultrasound, which showed the gallstones with some thickening of the gallbladder wall compatible with either acute or subacute cholecystitis with cholelithiasis.  PAST HISTORY:  She has Crohns disease, which was diagnosed in April of 2002. She also has seasonal allergic rhinitis and bronchial asthma.  Surgeries include C-section times two, umbilical hernia repair and small bowel resection with primary interval colostomy in April of 2002.  PRESENT MEDICATIONS:  _____ 9 mg q.d., Pentasa 4 tabs q.i.d., Astelin Nasal Spray b.i.d., Flonase 2 sprays q.d., and Zyrtec 10 mg q.d.  PHYSICAL EXAMINATION:  GENERAL APPEARANCE:  On  examination she is a pleasant female in no acute distress.  She shows evidence of significant weight gain since my last evaluation.  VITAL SIGNS:  Blood pressure 130/80, pulse 90, respirations 18 and temperature 98 degrees.  HEENT:  Unremarkable.  There is no scleral icterus.  NECK:  Supple without JVD or bruits.  LUNGS:  Clear to auscultation.  HEART:  Regular rate and rhythm without murmur, gallop or rub.  Normal S1 and S2.  ABDOMEN:  Soft and nondistended with no detectable tenderness to palpation. She has a supraumbilical midline and an infraumbilical midline scar.  RECTAL:  Was not repeated since the evaluation was normal with negative stool guaiac.  EXTREMITIES:  Unremarkable.  SKIN:  Shows evidence of mild eczematoid rash, especially of her upper extremities.  IMPRESSION: 1. Cholelithiasis with cholecystitis, with evidence of mild transaminase    elevation. 2. Mild hypokalemia. 3. History of seasonal allergies and allergic rhinitis. 4. History of bronchial asthma.  PLAN:  Patient and her husband were counselled for laparoscopic cholecystectomy.  Dr. Laural Golden has asked that we try to do a cholangiogram and liver biopsy because of the elevated transaminases.  We will make an attempt to do this.  Because of her operations we may have to do an open procedure, but more than likely if we use the Hasson port at the umbilicus we can gain access to the peritoneum. Dictated by:   Irving Shows, M.D. Attending Physician:  Shaune Pollack  DD:  05/14/01 TD:  05/14/01 Job: [id number]

## 2010-08-04 ENCOUNTER — Encounter: Payer: Self-pay | Admitting: Family Medicine

## 2010-08-05 ENCOUNTER — Other Ambulatory Visit: Payer: Self-pay | Admitting: Family Medicine

## 2010-08-05 ENCOUNTER — Encounter: Payer: Self-pay | Admitting: Family Medicine

## 2010-08-05 ENCOUNTER — Ambulatory Visit (INDEPENDENT_AMBULATORY_CARE_PROVIDER_SITE_OTHER): Payer: PRIVATE HEALTH INSURANCE | Admitting: Family Medicine

## 2010-08-05 VITALS — BP 124/78 | HR 82 | Resp 16 | Ht 68.0 in | Wt 168.4 lb

## 2010-08-05 DIAGNOSIS — K5090 Crohn's disease, unspecified, without complications: Secondary | ICD-10-CM

## 2010-08-05 DIAGNOSIS — E876 Hypokalemia: Secondary | ICD-10-CM | POA: Insufficient documentation

## 2010-08-05 DIAGNOSIS — K219 Gastro-esophageal reflux disease without esophagitis: Secondary | ICD-10-CM

## 2010-08-05 DIAGNOSIS — J45909 Unspecified asthma, uncomplicated: Secondary | ICD-10-CM

## 2010-08-05 DIAGNOSIS — Z13220 Encounter for screening for lipoid disorders: Secondary | ICD-10-CM

## 2010-08-05 DIAGNOSIS — R5381 Other malaise: Secondary | ICD-10-CM

## 2010-08-05 DIAGNOSIS — Z23 Encounter for immunization: Secondary | ICD-10-CM

## 2010-08-05 DIAGNOSIS — I10 Essential (primary) hypertension: Secondary | ICD-10-CM

## 2010-08-05 DIAGNOSIS — L309 Dermatitis, unspecified: Secondary | ICD-10-CM | POA: Insufficient documentation

## 2010-08-05 DIAGNOSIS — M810 Age-related osteoporosis without current pathological fracture: Secondary | ICD-10-CM

## 2010-08-05 DIAGNOSIS — L259 Unspecified contact dermatitis, unspecified cause: Secondary | ICD-10-CM

## 2010-08-05 DIAGNOSIS — Z139 Encounter for screening, unspecified: Secondary | ICD-10-CM

## 2010-08-05 MED ORDER — POTASSIUM CHLORIDE CRYS ER 20 MEQ PO TBCR: 20.0000 meq | EXTENDED_RELEASE_TABLET | Freq: Two times a day (BID) | ORAL | Status: DC

## 2010-08-05 MED ORDER — FLUOCINONIDE 0.05 % EX CREA: TOPICAL_CREAM | CUTANEOUS | Status: DC

## 2010-08-05 NOTE — Patient Instructions (Addendum)
CPE in 2 months.  Pls keep up your great attitude.    A healthy diet is rich in fruit, vegetables and whole grains. Poultry fish, nuts and beans are a healthy choice for protein rather then red meat. A low sodium diet and drinking 64 ounces of water daily is generally recommended. Oils and sweet should be limited. Carbohydrates especially for those who are diabetic or overweight, should be limited to 34-45 gram per meal. It is important to eat on a regular schedule, at least 3 times daily. Snacks should be primarily fruits, vegetables or nuts.   It is important that you exercise regularly at least 30 minutes 5 times a week. If you develop chest pain, have severe difficulty breathing, or feel very tired, stop exercising immediately and seek medical attention    We will schedule your mammogram  Pneumovac today.  fasting chem 7 , lipid , tsh and vit D in  2 months.  Increase potassium to one twice daily  Cream sent in for the rash on your left foot, which I think is eczema, if no better will refer you to dernmatology

## 2010-08-05 NOTE — Assessment & Plan Note (Addendum)
Dx in 2002, followed by Dr Sydell Axon Controlled on medication, pt encouraged to continue same

## 2010-08-05 NOTE — Assessment & Plan Note (Signed)
Dx approx 2010 Dr fields reportedly did upper endoscopy

## 2010-08-08 NOTE — Progress Notes (Signed)
  Subjective:    Patient ID: [name], female    DOB: Jul 31, 1970, 40 y.o.   MRN: [id number]  HPI The PT is here for follow up and re-evaluation of chronic medical conditions, medication management and review of recent lab and radiology data.  Preventive health is updated, specifically  Cancer screening and Immunization.   The PT denies any adverse reactions to current medications since the last visit.  She c/o puritic rash on her foot, states she has been treated for fungal infection with no good result and she is certain this is eczema She smokes, and wants to quit. Reports that her chron's is stable now that she is consistently taking her medicine.  Denies any depression or mental health problems at this time      Review of Systems Denies recent fever or chills. Denies sinus pressure, nasal congestion, ear pain or sore throat. Denies chest congestion, productive cough or wheezing. Denies chest pains, palpitations, paroxysmal nocturnal dyspnea, orthopnea and leg swelling Denies abdominal pain, nausea, vomiting,diarrhea or constipation.  Denies rectal bleeding or change in bowel movement. Denies dysuria, frequency, hesitancy or incontinence. Denies joint pain, swelling and limitation in mobility. Denies headaches, seizure, numbness, or tingling. Denies depression, anxiety or insomnia.       Objective:   Physical Exam Patient alert and oriented and in no Cardiopulmonary distress.  HEENT: No facial asymmetry, EOMI, no sinus tenderness, TM's clear, Oropharynx pink and moist.  Neck supple no adenopathy.  Chest: Clear to auscultation bilaterally.Decreased air entry bilaterally CVS: S1, S2 no murmurs, no S3.  ABD: Soft non tender. Bowel sounds normal.  Ext: No edema  MS: Adequate ROM spine, shoulders, hips and knees.  Skin: Intact,hyperpigmented maculopapular rash max diameter approx 6 inches on medial aspect of foot Psych: Good eye contact, normal affect. Memory intact  not anxious or depressed appearing.  CNS: CN 2-12 intact, power, tone and sensation normal throughout.        Assessment & Plan:

## 2010-08-08 NOTE — Assessment & Plan Note (Addendum)
Controlled and stable, pneumonia vaccine due and administered

## 2010-08-08 NOTE — Assessment & Plan Note (Signed)
Uncontrolled med prescribed

## 2010-08-16 ENCOUNTER — Ambulatory Visit (HOSPITAL_COMMUNITY)
Admission: RE | Admit: 2010-08-16 | Discharge: 2010-08-16 | Disposition: A | Discharge status: Home / Self Care | Payer: PRIVATE HEALTH INSURANCE | Source: Ambulatory Visit | Attending: Family Medicine | Admitting: Family Medicine

## 2010-08-16 DIAGNOSIS — Z139 Encounter for screening, unspecified: Secondary | ICD-10-CM

## 2010-08-16 DIAGNOSIS — Z1231 Encounter for screening mammogram for malignant neoplasm of breast: Secondary | ICD-10-CM | POA: Insufficient documentation

## 2010-08-23 ENCOUNTER — Other Ambulatory Visit: Payer: Self-pay | Admitting: Urgent Care

## 2010-08-24 LAB — CBC WITH DIFFERENTIAL/PLATELET
Basophils Absolute: 0 10*3/uL (ref 0.0–0.1)
Basophils Relative: 0 % (ref 0–1)
HCT: 40.4 % (ref 36.0–46.0)
Hemoglobin: 13.3 g/dL (ref 12.0–15.0)
Lymphocytes Relative: 47 % — ABNORMAL HIGH (ref 12–46)
MCHC: 32.9 g/dL (ref 30.0–36.0)
Neutro Abs: 7.3 10*3/uL (ref 1.7–7.7)
Neutrophils Relative %: 44 % (ref 43–77)
RDW: 14.8 % (ref 11.5–15.5)
WBC: 16.5 10*3/uL — ABNORMAL HIGH (ref 4.0–10.5)

## 2010-08-24 LAB — COMPREHENSIVE METABOLIC PANEL
ALT: 9 U/L (ref 0–35)
AST: 14 U/L (ref 0–37)
Albumin: 3.7 g/dL (ref 3.5–5.2)
Alkaline Phosphatase: 64 U/L (ref 39–117)
Potassium: 4.3 mEq/L (ref 3.5–5.3)
Sodium: 138 mEq/L (ref 135–145)
Total Bilirubin: 0.2 mg/dL — ABNORMAL LOW (ref 0.3–1.2)
Total Protein: 6.9 g/dL (ref 6.0–8.3)

## 2010-08-24 LAB — PATHOLOGIST SMEAR REVIEW

## 2010-08-25 ENCOUNTER — Other Ambulatory Visit: Payer: Self-pay | Admitting: Urgent Care

## 2010-08-25 DIAGNOSIS — K5090 Crohn's disease, unspecified, without complications: Secondary | ICD-10-CM

## 2010-09-16 ENCOUNTER — Other Ambulatory Visit: Payer: Self-pay

## 2010-09-17 MED ORDER — ADALIMUMAB 40 MG/0.8ML ~~LOC~~ KIT: 40.0000 mg | PACK | Freq: Once | SUBCUTANEOUS | Status: DC

## 2010-09-21 ENCOUNTER — Ambulatory Visit: Payer: PRIVATE HEALTH INSURANCE | Admitting: Family Medicine

## 2010-10-01 ENCOUNTER — Encounter: Payer: Self-pay | Admitting: Family Medicine

## 2010-10-04 ENCOUNTER — Other Ambulatory Visit: Payer: Self-pay | Admitting: Urgent Care

## 2010-10-05 LAB — BASIC METABOLIC PANEL
BUN: 9 mg/dL (ref 6–23)
Calcium: 9.1 mg/dL (ref 8.4–10.5)
Chloride: 105 mEq/L (ref 96–112)
Creat: 0.59 mg/dL (ref 0.50–1.10)

## 2010-10-05 LAB — CBC WITH DIFFERENTIAL/PLATELET
Basophils Absolute: 0 10*3/uL (ref 0.0–0.1)
Basophils Relative: 0 % (ref 0–1)
Eosinophils Absolute: 0.4 10*3/uL (ref 0.0–0.7)
HCT: 41.9 % (ref 36.0–46.0)
MCH: 28.1 pg (ref 26.0–34.0)
MCHC: 31.7 g/dL (ref 30.0–36.0)
Monocytes Absolute: 0.6 10*3/uL (ref 0.1–1.0)
Neutro Abs: 6.7 10*3/uL (ref 1.7–7.7)
RDW: 16.3 % — ABNORMAL HIGH (ref 11.5–15.5)

## 2010-10-05 LAB — LIPID PANEL
Cholesterol: 135 mg/dL (ref 0–200)
LDL Cholesterol: 61 mg/dL (ref 0–99)
Triglycerides: 92 mg/dL (ref <150)
VLDL: 18 mg/dL (ref 0–40)

## 2010-10-06 ENCOUNTER — Other Ambulatory Visit (HOSPITAL_COMMUNITY)
Admission: RE | Admit: 2010-10-06 | Discharge: 2010-10-06 | Disposition: A | Discharge status: Home / Self Care | Payer: PRIVATE HEALTH INSURANCE | Source: Ambulatory Visit | Attending: Family Medicine | Admitting: Family Medicine

## 2010-10-06 ENCOUNTER — Encounter: Payer: Self-pay | Admitting: Family Medicine

## 2010-10-06 ENCOUNTER — Ambulatory Visit (INDEPENDENT_AMBULATORY_CARE_PROVIDER_SITE_OTHER): Payer: PRIVATE HEALTH INSURANCE | Admitting: Family Medicine

## 2010-10-06 VITALS — BP 122/80 | HR 84 | Resp 16 | Ht 68.0 in | Wt 159.8 lb

## 2010-10-06 DIAGNOSIS — Z0000 Encounter for general adult medical examination without abnormal findings: Secondary | ICD-10-CM

## 2010-10-06 DIAGNOSIS — D72829 Elevated white blood cell count, unspecified: Secondary | ICD-10-CM

## 2010-10-06 DIAGNOSIS — Z1211 Encounter for screening for malignant neoplasm of colon: Secondary | ICD-10-CM

## 2010-10-06 DIAGNOSIS — Z01419 Encounter for gynecological examination (general) (routine) without abnormal findings: Secondary | ICD-10-CM | POA: Insufficient documentation

## 2010-10-06 DIAGNOSIS — Z124 Encounter for screening for malignant neoplasm of cervix: Secondary | ICD-10-CM

## 2010-10-06 DIAGNOSIS — M542 Cervicalgia: Secondary | ICD-10-CM | POA: Insufficient documentation

## 2010-10-06 MED ORDER — METHYLPREDNISOLONE ACETATE 80 MG/ML IJ SUSP
80.0000 mg | Freq: Once | INTRAMUSCULAR | Status: AC
  Administered 2010-10-06: 80 mg via INTRAMUSCULAR

## 2010-10-06 MED ORDER — PREDNISONE (PAK) 5 MG PO TABS: 5.0000 mg | ORAL_TABLET | ORAL | Status: DC

## 2010-10-06 NOTE — Progress Notes (Signed)
  Subjective:    Patient ID: [name], female    DOB: 30-Mar-1970, 40 y.o.   MRN: [id number]  HPI The PT is here for annual examand re-evaluation of chronic medical conditions, medication management and review of any available recent lab and radiology data.  Preventive health is updated, specifically  Cancer screening and Immunization.    The PT denies any adverse reactions to current medications since the last visit.  There are no new concerns.  C/o increased neck pain and stiffness with reduced mobility in the past week      Review of Systems Denies recent fever or chills. Denies sinus pressure, nasal congestion, ear pain or sore throat. Denies chest congestion, productive cough or wheezing. Denies chest pains, palpitations and leg swelling Denies abdominal pain, nausea, vomiting,diarrhea or constipation.   Denies dysuria, frequency, hesitancy or incontinence. Denies headaches, seizures, numbness, or tingling. Denies depression, anxiety or insomnia. Denies skin break down or rash.       Objective:   Physical Exam Pleasant well nourished female, alert and oriented x 3, in no cardio-pulmonary distress. Afebrile. HEENT No facial trauma or asymetry. Sinuses non tender.  EOMI, PERTL, fundoscopic exam is normal, no hemorhage or exudate.  External ears normal, tympanic membranes clear. Oropharynx moist, no exudate,fair entition. Neck:decreased ROM with trapezius spasm, no adenopathy,JVD or thyromegaly.No bruits.  Chest: Clear to ascultation bilaterally.No crackles or wheezes. Non tender to palpation  Breast: No asymetry,no masses. No nipple discharge or inversion. No axillary or supraclavicular adenopathy  Cardiovascular system; Heart sounds normal,  S1 and  S2 ,no S3.  No murmur, or thrill. Apical beat not displaced Peripheral pulses normal.  Abdomen: Soft, non tender, no organomegaly or masses. No bruits. Bowel sounds normal. No guarding, tenderness or  rebound.  Rectal:  No mass. Guaiac negative stool.  GU: External genitalia normal. No lesions. Vaginal canal normal.No discharge. Uterus normal size, no adnexal masses, no cervical motion or adnexal tenderness.  Musculoskeletal exam: Full ROM of spine, hips , shoulders and knees. No deformity ,swelling or crepitus noted. No muscle wasting or atrophy.   Neurologic: Cranial nerves 2 to 12 intact. Power, tone ,sensation and reflexes normal throughout. No disturbance in gait. No tremor.  Skin: Intact, no ulceration, erythema , scaling or rash noted. Pigmentation normal throughout  Psych; Normal mood and affect. Judgement and concentration normal        Assessment & Plan:

## 2010-10-06 NOTE — Assessment & Plan Note (Signed)
2 week flare of right neck pain radiating to shoulder, and u[pper extremity, has had surgery in the past, will prescribe steroids and admin depomedrol

## 2010-10-06 NOTE — Patient Instructions (Signed)
F/u in 3.5 months.  You are being referred to hematology about your white cell count.since it is slightly elevated consistently.  Injection and medication for neck pain today.   Please think about quitting smoking.  This is very important for your health.  Consider setting a quit date, then cutting back or switching brands to prepare to stop.  Also think of the money you will save every day by not smoking.  Quick Tips to Quit Smoking: Fix a date i.e. keep a date in mind from when you would not touch a tobacco product to smoke  Keep yourself busy and block your mind with work loads or reading books or watching movies in malls where smoking is not allowed  Vanish off the things which reminds you about smoking for example match box, or your favorite lighter, or the pipe you used for smoking, or your favorite jeans and shirt with which you used to enjoy smoking, or the club where you used to do smoking  Try to avoid certain people places and incidences where and with whom smoking is a common factor to add on  Praise yourself with some token gifts from the money you saved by stopping smoking  Anti Smoking teams are there to help you. Join their programs  Anti-smoking Gums are there in many medical shops. Try them to quit smoking   Side-effects of Smoking: Disease caused by smoking cigarettes are emphysema, bronchitis, heart failures  Premature death  Cancer is the major side effect of smoking  Heart attacks and strokes are the quick effects of smoking causing sudden death  Some smokers lives end up with limbs amputated  Breathing problem or fast breathing is another side effect of smoking  Due to more intakes of smokes, carbon mono-oxide goes into your brain and other muscles of the body which leads to swelling of the veins and blockage to the air passage to lungs  Carbon monoxide blocks blood vessels which leads to blockage in the flow of blood to different major body organs like heart lungs  and thus leads to attacks and deaths  During pregnancy smoking is very harmful and leads to premature birth of the infant, spontaneous abortions, low weight of the infant during birth  Fat depositions to narrow and blocked blood vessels causing heart attacks  In many cases cigarette smoking caused infertility in men

## 2010-10-07 LAB — VITAMIN D 1,25 DIHYDROXY
Vitamin D2 1, 25 (OH)2: <8 pg/mL
Vitamin D3 1, 25 (OH)2: 47 pg/mL

## 2010-10-15 ENCOUNTER — Encounter: Payer: Self-pay | Admitting: Internal Medicine

## 2010-10-17 ENCOUNTER — Emergency Department (HOSPITAL_COMMUNITY): Payer: Medicare Other

## 2010-10-17 ENCOUNTER — Encounter (HOSPITAL_COMMUNITY): Payer: Self-pay

## 2010-10-17 ENCOUNTER — Inpatient Hospital Stay (HOSPITAL_COMMUNITY)
Admission: EM | Admit: 2010-10-17 | Discharge: 2010-10-19 | DRG: 387 | Disposition: A | Discharge status: Home / Self Care | Payer: Medicare Other | Attending: Internal Medicine | Admitting: Internal Medicine

## 2010-10-17 DIAGNOSIS — S139XXA Sprain of joints and ligaments of unspecified parts of neck, initial encounter: Secondary | ICD-10-CM

## 2010-10-17 DIAGNOSIS — F319 Bipolar disorder, unspecified: Secondary | ICD-10-CM

## 2010-10-17 DIAGNOSIS — M25579 Pain in unspecified ankle and joints of unspecified foot: Secondary | ICD-10-CM

## 2010-10-17 DIAGNOSIS — K219 Gastro-esophageal reflux disease without esophagitis: Secondary | ICD-10-CM

## 2010-10-17 DIAGNOSIS — F3289 Other specified depressive episodes: Secondary | ICD-10-CM

## 2010-10-17 DIAGNOSIS — J45909 Unspecified asthma, uncomplicated: Secondary | ICD-10-CM

## 2010-10-17 DIAGNOSIS — K50112 Crohn's disease of large intestine with intestinal obstruction: Secondary | ICD-10-CM | POA: Diagnosis present

## 2010-10-17 DIAGNOSIS — K5090 Crohn's disease, unspecified, without complications: Secondary | ICD-10-CM

## 2010-10-17 DIAGNOSIS — Z9889 Other specified postprocedural states: Secondary | ICD-10-CM

## 2010-10-17 DIAGNOSIS — M545 Low back pain, unspecified: Secondary | ICD-10-CM

## 2010-10-17 DIAGNOSIS — B353 Tinea pedis: Secondary | ICD-10-CM

## 2010-10-17 DIAGNOSIS — R7401 Elevation of levels of liver transaminase levels: Secondary | ICD-10-CM | POA: Diagnosis present

## 2010-10-17 DIAGNOSIS — R7402 Elevation of levels of lactic acid dehydrogenase (LDH): Secondary | ICD-10-CM | POA: Diagnosis present

## 2010-10-17 DIAGNOSIS — R197 Diarrhea, unspecified: Secondary | ICD-10-CM

## 2010-10-17 DIAGNOSIS — E876 Hypokalemia: Secondary | ICD-10-CM

## 2010-10-17 DIAGNOSIS — L309 Dermatitis, unspecified: Secondary | ICD-10-CM

## 2010-10-17 DIAGNOSIS — M542 Cervicalgia: Secondary | ICD-10-CM

## 2010-10-17 DIAGNOSIS — F329 Major depressive disorder, single episode, unspecified: Secondary | ICD-10-CM

## 2010-10-17 DIAGNOSIS — G44209 Tension-type headache, unspecified, not intractable: Secondary | ICD-10-CM

## 2010-10-17 DIAGNOSIS — D518 Other vitamin B12 deficiency anemias: Secondary | ICD-10-CM

## 2010-10-17 DIAGNOSIS — R5381 Other malaise: Secondary | ICD-10-CM

## 2010-10-17 DIAGNOSIS — F411 Generalized anxiety disorder: Secondary | ICD-10-CM

## 2010-10-17 DIAGNOSIS — D72829 Elevated white blood cell count, unspecified: Secondary | ICD-10-CM

## 2010-10-17 DIAGNOSIS — M129 Arthropathy, unspecified: Secondary | ICD-10-CM

## 2010-10-17 DIAGNOSIS — R1031 Right lower quadrant pain: Secondary | ICD-10-CM

## 2010-10-17 DIAGNOSIS — B009 Herpesviral infection, unspecified: Secondary | ICD-10-CM

## 2010-10-17 DIAGNOSIS — R9389 Abnormal findings on diagnostic imaging of other specified body structures: Secondary | ICD-10-CM

## 2010-10-17 DIAGNOSIS — J309 Allergic rhinitis, unspecified: Secondary | ICD-10-CM

## 2010-10-17 DIAGNOSIS — K279 Peptic ulcer, site unspecified, unspecified as acute or chronic, without hemorrhage or perforation: Secondary | ICD-10-CM

## 2010-10-17 DIAGNOSIS — K5010 Crohn's disease of large intestine without complications: Secondary | ICD-10-CM | POA: Diagnosis present

## 2010-10-17 DIAGNOSIS — K5000 Crohn's disease of small intestine without complications: Principal | ICD-10-CM | POA: Diagnosis present

## 2010-10-17 DIAGNOSIS — R7309 Other abnormal glucose: Secondary | ICD-10-CM

## 2010-10-17 HISTORY — DX: Other specified postprocedural states: Z98.890

## 2010-10-17 LAB — DIFFERENTIAL
Basophils Absolute: 0 10*3/uL (ref 0.0–0.1)
Basophils Relative: 0 % (ref 0–1)
Eosinophils Relative: 3 % (ref 0–5)
Lymphocytes Relative: 36 % (ref 12–46)
Monocytes Absolute: 1 10*3/uL (ref 0.1–1.0)
Neutro Abs: 7.4 10*3/uL (ref 1.7–7.7)

## 2010-10-17 LAB — HEPATIC FUNCTION PANEL
ALT: 15 U/L (ref 0–35)
AST: 16 U/L (ref 0–37)
Bilirubin, Direct: 0.1 mg/dL (ref 0.0–0.3)
Indirect Bilirubin: 0.2 mg/dL — ABNORMAL LOW (ref 0.3–0.9)
Total Protein: 7.8 g/dL (ref 6.0–8.3)

## 2010-10-17 LAB — BASIC METABOLIC PANEL
BUN: 7 mg/dL (ref 6–23)
Calcium: 9.3 mg/dL (ref 8.4–10.5)
Creatinine, Ser: 0.58 mg/dL (ref 0.50–1.10)
GFR calc Af Amer: >60 mL/min (ref >60)

## 2010-10-17 LAB — URINALYSIS, ROUTINE W REFLEX MICROSCOPIC
Bilirubin Urine: NEGATIVE
Glucose, UA: NEGATIVE mg/dL
Hgb urine dipstick: NEGATIVE
Ketones, ur: NEGATIVE mg/dL
Leukocytes, UA: NEGATIVE
Protein, ur: NEGATIVE mg/dL

## 2010-10-17 LAB — CBC
HCT: 39.5 % (ref 36.0–46.0)
MCHC: 33.2 g/dL (ref 30.0–36.0)
Platelets: 326 10*3/uL (ref 150–400)
RDW: 14.5 % (ref 11.5–15.5)
WBC: 13.7 10*3/uL — ABNORMAL HIGH (ref 4.0–10.5)

## 2010-10-17 LAB — LACTIC ACID, PLASMA: Lactic Acid, Venous: 0.9 mmol/L (ref 0.5–2.2)

## 2010-10-17 LAB — SEDIMENTATION RATE: Sed Rate: 16 mm/hr (ref 0–22)

## 2010-10-17 LAB — PREGNANCY, URINE: Preg Test, Ur: NEGATIVE

## 2010-10-17 MED ORDER — IOHEXOL 300 MG/ML  SOLN
100.0000 mL | Freq: Once | INTRAMUSCULAR | Status: AC | PRN
  Administered 2010-10-17: 100 mL via INTRAVENOUS

## 2010-10-17 MED ORDER — ENOXAPARIN SODIUM 30 MG/0.3ML ~~LOC~~ SOLN
30.0000 mg | SUBCUTANEOUS | Status: DC
  Administered 2010-10-17 – 2010-10-18 (×2): 30 mg via SUBCUTANEOUS
  Filled 2010-10-17 (×2): qty 0.3

## 2010-10-17 MED ORDER — SENNOSIDES-DOCUSATE SODIUM 8.6-50 MG PO TABS: 1.0000 | ORAL_TABLET | Freq: Every day | ORAL | Status: DC | PRN

## 2010-10-17 MED ORDER — METHYLPREDNISOLONE SODIUM SUCC 125 MG IJ SOLR
80.0000 mg | Freq: Once | INTRAMUSCULAR | Status: AC
  Administered 2010-10-17: 17:00:00 via INTRAVENOUS
  Filled 2010-10-17: qty 2

## 2010-10-17 MED ORDER — ONDANSETRON HCL 4 MG/2ML IJ SOLN
4.0000 mg | Freq: Once | INTRAMUSCULAR | Status: AC
  Administered 2010-10-17: 4 mg via INTRAVENOUS
  Filled 2010-10-17: qty 2

## 2010-10-17 MED ORDER — MORPHINE SULFATE 4 MG/ML IJ SOLN
4.0000 mg | Freq: Once | INTRAMUSCULAR | Status: AC
  Administered 2010-10-17: 4 mg via INTRAVENOUS
  Filled 2010-10-17: qty 1

## 2010-10-17 MED ORDER — ONDANSETRON HCL 4 MG/2ML IJ SOLN: 4.0000 mg | Freq: Four times a day (QID) | INTRAMUSCULAR | Status: DC | PRN

## 2010-10-17 MED ORDER — SODIUM CHLORIDE 0.9 % IV SOLN
INTRAVENOUS | Status: DC
  Administered 2010-10-17 – 2010-10-18 (×3): via INTRAVENOUS

## 2010-10-17 MED ORDER — METHYLPREDNISOLONE SODIUM SUCC 125 MG IJ SOLR
125.0000 mg | Freq: Three times a day (TID) | INTRAMUSCULAR | Status: DC
  Administered 2010-10-18: 125 mg via INTRAVENOUS
  Filled 2010-10-17: qty 2

## 2010-10-17 MED ORDER — POTASSIUM CHLORIDE CRYS ER 20 MEQ PO TBCR
20.0000 meq | EXTENDED_RELEASE_TABLET | Freq: Two times a day (BID) | ORAL | Status: DC
  Administered 2010-10-17 – 2010-10-19 (×4): 20 meq via ORAL
  Filled 2010-10-17 (×4): qty 1

## 2010-10-17 MED ORDER — ZOLPIDEM TARTRATE 5 MG PO TABS: 5.0000 mg | ORAL_TABLET | Freq: Every evening | ORAL | Status: DC | PRN

## 2010-10-17 MED ORDER — SODIUM CHLORIDE 0.9 % IV SOLN
Freq: Once | INTRAVENOUS | Status: AC
  Administered 2010-10-17: 1000 mL via INTRAVENOUS

## 2010-10-17 MED ORDER — SODIUM CHLORIDE 0.9 % IV BOLUS (SEPSIS)
1000.0000 mL | Freq: Once | INTRAVENOUS | Status: AC
  Administered 2010-10-17: 1000 mL via INTRAVENOUS

## 2010-10-17 MED ORDER — DIPHENHYDRAMINE HCL 25 MG PO CAPS: 25.0000 mg | ORAL_CAPSULE | Freq: Every day | ORAL | Status: DC | PRN

## 2010-10-17 MED ORDER — B-12 1000 MCG PO CAPS
1.0000 | ORAL_CAPSULE | Freq: Every day | ORAL | Status: DC
  Filled 2010-10-17 (×2): qty 1

## 2010-10-17 MED ORDER — ONDANSETRON HCL 4 MG PO TABS: 4.0000 mg | ORAL_TABLET | Freq: Four times a day (QID) | ORAL | Status: DC | PRN

## 2010-10-17 MED ORDER — PANTOPRAZOLE SODIUM 40 MG PO TBEC
40.0000 mg | DELAYED_RELEASE_TABLET | Freq: Every day | ORAL | Status: DC
  Administered 2010-10-17 – 2010-10-19 (×3): 40 mg via ORAL
  Filled 2010-10-17 (×3): qty 1

## 2010-10-17 MED ORDER — MORPHINE SULFATE 2 MG/ML IJ SOLN
2.0000 mg | INTRAMUSCULAR | Status: DC | PRN
  Administered 2010-10-17 – 2010-10-19 (×11): 2 mg via INTRAVENOUS
  Filled 2010-10-17 (×11): qty 1

## 2010-10-17 NOTE — Assessment & Plan Note (Signed)
Persistently elvated wbc, will e hematology eval
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
## 2010-10-17 NOTE — ED Notes (Signed)
Receiving nurse unable to take report, will return call when able

## 2010-10-17 NOTE — ED Notes (Signed)
Right lower quad pain for 5 days, denies any n/v, does admit to mucous in her stool yesterday,
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
## 2010-10-17 NOTE — ED Notes (Signed)
Pt states that she is feeling better, has finished water for ct scan, ct scan notified, warm blanket given to pt, pt and family updated on plan of care

## 2010-10-17 NOTE — ED Notes (Signed)
Report given to Suanne Marker, RN on 300

## 2010-10-17 NOTE — Assessment & Plan Note (Signed)
Deteriorated, anti-inflammatory administered at [facility]

## 2010-10-17 NOTE — ED Notes (Signed)
Pt reports a hx of crones disease.  Pt reports RLQ pain and for the past 5 days.  Pt states that her stools have been "mucousy" lately.  Pt denies any n/v.

## 2010-10-17 NOTE — ED Notes (Signed)
MD at bedside. 

## 2010-10-18 ENCOUNTER — Encounter (HOSPITAL_COMMUNITY): Payer: Self-pay | Admitting: Urgent Care

## 2010-10-18 LAB — COMPREHENSIVE METABOLIC PANEL
AST: 56 U/L — ABNORMAL HIGH (ref 0–37)
Albumin: 2.8 g/dL — ABNORMAL LOW (ref 3.5–5.2)
BUN: 4 mg/dL — ABNORMAL LOW (ref 6–23)
Calcium: 8.7 mg/dL (ref 8.4–10.5)
Creatinine, Ser: 0.56 mg/dL (ref 0.50–1.10)
Total Protein: 6.8 g/dL (ref 6.0–8.3)

## 2010-10-18 LAB — CBC
HCT: 37.4 % (ref 36.0–46.0)
Hemoglobin: 12.6 g/dL (ref 12.0–15.0)
MCV: 84.6 fL (ref 78.0–100.0)
Platelets: 324 10*3/uL (ref 150–400)
RBC: 4.42 MIL/uL (ref 3.87–5.11)
WBC: 13.7 10*3/uL — ABNORMAL HIGH (ref 4.0–10.5)

## 2010-10-18 MED ORDER — ALUM & MAG HYDROXIDE-SIMETH 200-200-20 MG/5ML PO SUSP
30.0000 mL | Freq: Four times a day (QID) | ORAL | Status: DC | PRN
  Administered 2010-10-18 – 2010-10-19 (×3): 30 mL via ORAL
  Filled 2010-10-18 (×3): qty 30

## 2010-10-18 MED ORDER — VITAMIN B-12 1000 MCG PO TABS
1000.0000 ug | ORAL_TABLET | Freq: Every day | ORAL | Status: DC
  Administered 2010-10-18: 100 ug via ORAL
  Administered 2010-10-19: 1000 ug via ORAL
  Filled 2010-10-18 (×3): qty 1

## 2010-10-18 MED ORDER — BUDESONIDE 3 MG PO CP24
9.0000 mg | ORAL_CAPSULE | Freq: Every day | ORAL | Status: DC
  Administered 2010-10-18 – 2010-10-19 (×2): 9 mg via ORAL
  Filled 2010-10-18 (×3): qty 3
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
## 2010-10-18 NOTE — Progress Notes (Signed)
Subjective: This lady feels improved from yesterday. Her abdominal pain is less. She is up able to tolerate a clear fluid diet. She has no nausea or vomiting.           Physical Exam: Blood pressure 98/63, pulse 69, temperature 98.1 F (36.7 C), temperature source Oral, resp. rate 18, height 5' 7"  (1.702 m), weight 73.1 kg (161 lb 2.5 oz), last menstrual period 09/28/2010, SpO2 100.00%. She looks systemically well. Abdomen is soft and nontender. Lung fields are clear.   Investigations: Results for orders placed during the hospital encounter of 10/17/10 (from the past 48 hour(s))  URINALYSIS, ROUTINE W REFLEX MICROSCOPIC     Status: Normal   Collection Time   10/17/10 12:49 PM      Component Value Range Comment   Color, Urine YELLOW  YELLOW     Appearance CLEAR  CLEAR     Specific Gravity, Urine 1.020  1.005 - 1.030     pH 6.0  5.0 - 8.0     Glucose, UA NEGATIVE  NEGATIVE (mg/dL)    Hgb urine dipstick NEGATIVE  NEGATIVE     Bilirubin Urine NEGATIVE  NEGATIVE     Ketones, ur NEGATIVE  NEGATIVE (mg/dL)    Protein, ur NEGATIVE  NEGATIVE (mg/dL)    Urobilinogen, UA 0.2  0.0 - 1.0 (mg/dL)    Nitrite NEGATIVE  NEGATIVE     Leukocytes, UA NEGATIVE  NEGATIVE  MICROSCOPIC NOT DONE ON URINES WITH NEGATIVE PROTEIN, BLOOD, LEUKOCYTES, NITRITE, OR GLUCOSE <1000 mg/dL.  PREGNANCY, URINE     Status: Normal   Collection Time   10/17/10  1:09 PM      Component Value Range Comment   Preg Test, Ur NEGATIVE     BASIC METABOLIC PANEL     Status: Normal   Collection Time   10/17/10  1:25 PM      Component Value Range Comment   Sodium 135  135 - 145 (mEq/L)    Potassium 4.0  3.5 - 5.1 (mEq/L)    Chloride 103  96 - 112 (mEq/L)    CO2 20  19 - 32 (mEq/L)    Glucose, Bld 85  70 - 99 (mg/dL)    BUN 7  6 - 23 (mg/dL)    Creatinine, Ser 0.58  0.50 - 1.10 (mg/dL)    Calcium 9.3  8.4 - 10.5 (mg/dL)    GFR calc non Af Amer >60  >60 (mL/min)    GFR calc Af Amer >60  >60 (mL/min)   CBC     Status:  Abnormal   Collection Time   10/17/10  1:25 PM      Component Value Range Comment   WBC 13.7 (*) 4.0 - 10.5 (K/uL)    RBC 4.70  3.87 - 5.11 (MIL/uL)    Hemoglobin 13.1  12.0 - 15.0 (g/dL)    HCT 39.5  36.0 - 46.0 (%)    MCV 84.0  78.0 - 100.0 (fL)    MCH 27.9  26.0 - 34.0 (pg)    MCHC 33.2  30.0 - 36.0 (g/dL)    RDW 14.5  11.5 - 15.5 (%)    Platelets 326  150 - 400 (K/uL)   DIFFERENTIAL     Status: Abnormal   Collection Time   10/17/10  1:25 PM      Component Value Range Comment   Neutrophils Relative 54  43 - 77 (%)    Neutro Abs 7.4  1.7 - 7.7 (K/uL)  Lymphocytes Relative 36  12 - 46 (%)    Lymphs Abs 4.9 (*) 0.7 - 4.0 (K/uL)    Monocytes Relative 7  3 - 12 (%)    Monocytes Absolute 1.0  0.1 - 1.0 (K/uL)    Eosinophils Relative 3  0 - 5 (%)    Eosinophils Absolute 0.4  0.0 - 0.7 (K/uL)    Basophils Relative 0  0 - 1 (%)    Basophils Absolute 0.0  0.0 - 0.1 (K/uL)   HEPATIC FUNCTION PANEL     Status: Abnormal   Collection Time   10/17/10  1:25 PM      Component Value Range Comment   Total Protein 7.8  6.0 - 8.3 (g/dL)    Albumin 3.2 (*) 3.5 - 5.2 (g/dL)    AST 16  0 - 37 (U/L)    ALT 15  0 - 35 (U/L)    Alkaline Phosphatase 79  39 - 117 (U/L)    Total Bilirubin 0.3  0.3 - 1.2 (mg/dL)    Bilirubin, Direct 0.1  0.0 - 0.3 (mg/dL)    Indirect Bilirubin 0.2 (*) 0.3 - 0.9 (mg/dL)   LIPASE, BLOOD     Status: Normal   Collection Time   10/17/10  1:25 PM      Component Value Range Comment   Lipase 40  11 - 59 (U/L)   LACTIC ACID, PLASMA     Status: Normal   Collection Time   10/17/10  1:25 PM      Component Value Range Comment   Lactic Acid, Venous 0.9  0.5 - 2.2 (mmol/L)   SEDIMENTATION RATE     Status: Normal   Collection Time   10/17/10  1:25 PM      Component Value Range Comment   Sed Rate 16  0 - 22 (mm/hr)   C-REACTIVE PROTEIN     Status: Abnormal   Collection Time   10/17/10  1:25 PM      Component Value Range Comment   CRP 0.4 (*) <0.6 (mg/dL)   COMPREHENSIVE  METABOLIC PANEL     Status: Abnormal   Collection Time   10/18/10  4:37 AM      Component Value Range Comment   Sodium 137  135 - 145 (mEq/L)    Potassium 4.2  3.5 - 5.1 (mEq/L)    Chloride 105  96 - 112 (mEq/L)    CO2 19  19 - 32 (mEq/L)    Glucose, Bld 126 (*) 70 - 99 (mg/dL)    BUN 4 (*) 6 - 23 (mg/dL)    Creatinine, Ser 0.56  0.50 - 1.10 (mg/dL)    Calcium 8.7  8.4 - 10.5 (mg/dL)    Total Protein 6.8  6.0 - 8.3 (g/dL)    Albumin 2.8 (*) 3.5 - 5.2 (g/dL)    AST 56 (*) 0 - 37 (U/L)    ALT 73 (*) 0 - 35 (U/L)    Alkaline Phosphatase 134 (*) 39 - 117 (U/L)    Total Bilirubin 0.3  0.3 - 1.2 (mg/dL)    GFR calc non Af Amer >60  >60 (mL/min)    GFR calc Af Amer >60  >60 (mL/min)   CBC     Status: Abnormal   Collection Time   10/18/10  4:37 AM      Component Value Range Comment   WBC 13.7 (*) 4.0 - 10.5 (K/uL)    RBC 4.42  3.87 - 5.11 (MIL/uL)  Hemoglobin 12.6  12.0 - 15.0 (g/dL)    HCT 37.4  36.0 - 46.0 (%)    MCV 84.6  78.0 - 100.0 (fL)    MCH 28.5  26.0 - 34.0 (pg)    MCHC 33.7  30.0 - 36.0 (g/dL)    RDW 14.6  11.5 - 15.5 (%)    Platelets 324  150 - 400 (K/uL)    No results found for this or any previous visit (from the past 240 hour(s)).  Ct Abdomen Pelvis W Contrast  10/17/2010  *RADIOLOGY REPORT*  Clinical Data: Right lower quadrant abdominal pain, history of Crohn disease and kidney stones.  Status post cholecystectomy and appendectomy.  CT ABDOMEN AND PELVIS WITH CONTRAST  Technique:  Multidetector CT imaging of the abdomen and pelvis was performed following the standard protocol during bolus administration of intravenous contrast.  Contrast: 100 ml Omnipaque-300 IV  Comparison: 02/20/2009  Findings: Lung bases are clear.  Liver, spleen, pancreas, and adrenal glands within normal limits.  Status post cholecystectomy.  No intrahepatic or extrahepatic ductal dilatation.  Bilateral renal cysts.  No hydronephrosis.  No evidence of bowel obstruction.  Prior ileocecal resection.   Wall thickening and inflammatory changes involving the neoterminal ileum (for example, series 2/image 56), compatible with active inflammatory Crohn's disease. No drainable fluid collection or abscess.  No findings to suggest fixed narrowing or stricture.  No evidence of abdominal aortic aneurysm.  Uterus and bilateral ovaries are unremarkable, noting a 2.5 cm right ovarian cyst / follicle.  Trace pelvic fluid, likely physiologic.  Visualized osseous structures are within normal limits.  No sacroiliitis.  IMPRESSION: Prior ileocecal resection.  Wall thickening/inflammatory changes involving the neoterminal ileum, compatible with active inflammatory Crohn disease.  No drainable fluid collection or abscess.  No fixed narrowing or stricture.  Original Report Authenticated By: Julian Hy, M.D.      Medications: I have reviewed the patient's current medications.  Impression: 1. AcuteCrohn's flare, improving. 2. Chronic leukocytosis, unclear etiology. 3. Gastroesophageal reflux disease, stable.     Plan: 1. Continue intravenous steroids for the time being. 2. Await gastroenterology opinion from Dr. Sydell Axon. I think this lady may be able to go home soon.     LOS: 1 day   [place] C 10/18/2010, 8:13 AM

## 2010-10-19 DIAGNOSIS — R740 Nonspecific elevation of levels of transaminase and lactic acid dehydrogenase [LDH]: Secondary | ICD-10-CM

## 2010-10-19 DIAGNOSIS — R7401 Elevation of levels of liver transaminase levels: Secondary | ICD-10-CM

## 2010-10-19 DIAGNOSIS — K5090 Crohn's disease, unspecified, without complications: Secondary | ICD-10-CM

## 2010-10-19 DIAGNOSIS — K219 Gastro-esophageal reflux disease without esophagitis: Secondary | ICD-10-CM

## 2010-10-19 MED ORDER — CENTRUM PO TABS: 1.0000 | ORAL_TABLET | Freq: Every day | ORAL | Status: AC

## 2010-10-19 MED ORDER — BUDESONIDE 3 MG PO CP24: 9.0000 mg | ORAL_CAPSULE | Freq: Every day | ORAL | Status: DC

## 2010-10-19 MED ORDER — OMEPRAZOLE 20 MG PO CPDR: 20.0000 mg | DELAYED_RELEASE_CAPSULE | Freq: Two times a day (BID) | ORAL | Status: DC

## 2010-10-19 NOTE — Discharge Summary (Signed)
Physician Discharge Summary  Patient ID: [name] MRN: [id number] DOB/AGE: November 01, 1970 40 y.o. Primary [place], MD, MD Admit date: 10/17/2010 Discharge date: 10/19/2010    Discharge Diagnoses:  1. Acute flare of Crohn's disease, improved. 2. Elevated liver enzymes, to be followed. 3. Hypokalemia. 4. Chronic leukocytosis, to be followed by hematology. 5. Gastroesophageal reflux disease.   Current Discharge Medication List    START taking these medications   Details  budesonide (ENTOCORT EC) 3 MG 24 hr capsule Take 3 capsules (9 mg total) by mouth daily. Qty: 30 capsule, Refills: 0   Associated Diagnoses: Regional enteritis of unspecified site      CONTINUE these medications which have CHANGED   Details  Multiple Vitamins-Minerals (CENTRUM) tablet Take 1 tablet by mouth daily. Qty: 30 tablet, Refills: 0      CONTINUE these medications which have NOT CHANGED   Details  acetaminophen (TYLENOL) 500 MG tablet Take 500 mg by mouth as needed. For pain    acyclovir (ZOVIRAX) 5 % ointment Apply topically. APPLY as needed to rash    Cyanocobalamin (B-12) 1000 MCG CAPS Take 1 capsule by mouth daily.      diphenhydrAMINE (BENADRYL) 25 mg capsule Take 25 mg by mouth daily as needed. For allergies     ketoconazole (NIZORAL) 2 % cream Apply topically daily. APPLY TO FOOT TWO TIMES A DAY     omeprazole (PRILOSEC) 20 MG capsule Take 1 capsule (20 mg total) by mouth daily. Qty: 30 capsule, Refills: 11    potassium chloride SA (K-DUR,KLOR-CON) 20 MEQ tablet Take 1 tablet (20 mEq total) by mouth 2 (two) times daily. Qty: 60 tablet, Refills: 11   Associated Diagnoses: Hypokalemia    adalimumab (HUMIRA) 40 MG/0.8ML injection Inject 0.8 mLs (40 mg total) into the skin once. GIVE 40 MG (0.8ML) [place] EVERY 14 DAYS Qty: 12 each, Refills: 0      STOP taking these medications     Calcium Carbonate (CALTRATE 600 PO)      fluocinonide (LIDEX) 0.05 % cream      predniSONE, Pak, (STERAPRED) 5 MG TABS         Discharged Condition: Stable and improved.    Consults: Gastroenterology, Dr. Sydell Axon.  Significant Diagnostic Studies: Ct Abdomen Pelvis W Contrast  10/17/2010  *RADIOLOGY REPORT*  Clinical Data: Right lower quadrant abdominal pain, history of Crohn disease and kidney stones.  Status post cholecystectomy and appendectomy.  CT ABDOMEN AND PELVIS WITH CONTRAST  Technique:  Multidetector CT imaging of the abdomen and pelvis was performed following the standard protocol during bolus administration of intravenous contrast.  Contrast: 100 ml Omnipaque-300 IV  Comparison: 02/20/2009  Findings: Lung bases are clear.  Liver, spleen, pancreas, and adrenal glands within normal limits.  Status post cholecystectomy.  No intrahepatic or extrahepatic ductal dilatation.  Bilateral renal cysts.  No hydronephrosis.  No evidence of bowel obstruction.  Prior ileocecal resection.  Wall thickening and inflammatory changes involving the neoterminal ileum (for example, series 2/image 56), compatible with active inflammatory Crohn's disease. No drainable fluid collection or abscess.  No findings to suggest fixed narrowing or stricture.  No evidence of abdominal aortic aneurysm.  Uterus and bilateral ovaries are unremarkable, noting a 2.5 cm right ovarian cyst / follicle.  Trace pelvic fluid, likely physiologic.  Visualized osseous structures are within normal limits.  No sacroiliitis.  IMPRESSION: Prior ileocecal resection.  Wall thickening/inflammatory changes involving the neoterminal ileum, compatible with active inflammatory Crohn disease.  No drainable fluid collection  or abscess.  No fixed narrowing or stricture.  Original Report Authenticated By: Julian Hy, M.D.    Lab Results: Results for orders placed during the hospital encounter of 10/17/10 (from the past 48 hour(s))  URINALYSIS, ROUTINE W REFLEX MICROSCOPIC     Status: Normal   Collection Time   10/17/10  12:49 PM      Component Value Range Comment   Color, Urine YELLOW  YELLOW     Appearance CLEAR  CLEAR     Specific Gravity, Urine 1.020  1.005 - 1.030     pH 6.0  5.0 - 8.0     Glucose, UA NEGATIVE  NEGATIVE (mg/dL)    Hgb urine dipstick NEGATIVE  NEGATIVE     Bilirubin Urine NEGATIVE  NEGATIVE     Ketones, ur NEGATIVE  NEGATIVE (mg/dL)    Protein, ur NEGATIVE  NEGATIVE (mg/dL)    Urobilinogen, UA 0.2  0.0 - 1.0 (mg/dL)    Nitrite NEGATIVE  NEGATIVE     Leukocytes, UA NEGATIVE  NEGATIVE  MICROSCOPIC NOT DONE ON URINES WITH NEGATIVE PROTEIN, BLOOD, LEUKOCYTES, NITRITE, OR GLUCOSE <1000 mg/dL.  PREGNANCY, URINE     Status: Normal   Collection Time   10/17/10  1:09 PM      Component Value Range Comment   Preg Test, Ur NEGATIVE     BASIC METABOLIC PANEL     Status: Normal   Collection Time   10/17/10  1:25 PM      Component Value Range Comment   Sodium 135  135 - 145 (mEq/L)    Potassium 4.0  3.5 - 5.1 (mEq/L)    Chloride 103  96 - 112 (mEq/L)    CO2 20  19 - 32 (mEq/L)    Glucose, Bld 85  70 - 99 (mg/dL)    BUN 7  6 - 23 (mg/dL)    Creatinine, Ser 0.58  0.50 - 1.10 (mg/dL)    Calcium 9.3  8.4 - 10.5 (mg/dL)    GFR calc non Af Amer >60  >60 (mL/min)    GFR calc Af Amer >60  >60 (mL/min)   CBC     Status: Abnormal   Collection Time   10/17/10  1:25 PM      Component Value Range Comment   WBC 13.7 (*) 4.0 - 10.5 (K/uL)    RBC 4.70  3.87 - 5.11 (MIL/uL)    Hemoglobin 13.1  12.0 - 15.0 (g/dL)    HCT 39.5  36.0 - 46.0 (%)    MCV 84.0  78.0 - 100.0 (fL)    MCH 27.9  26.0 - 34.0 (pg)    MCHC 33.2  30.0 - 36.0 (g/dL)    RDW 14.5  11.5 - 15.5 (%)    Platelets 326  150 - 400 (K/uL)   DIFFERENTIAL     Status: Abnormal   Collection Time   10/17/10  1:25 PM      Component Value Range Comment   Neutrophils Relative 54  43 - 77 (%)    Neutro Abs 7.4  1.7 - 7.7 (K/uL)    Lymphocytes Relative 36  12 - 46 (%)    Lymphs Abs 4.9 (*) 0.7 - 4.0 (K/uL)    Monocytes Relative 7  3 - 12 (%)     Monocytes Absolute 1.0  0.1 - 1.0 (K/uL)    Eosinophils Relative 3  0 - 5 (%)    Eosinophils Absolute 0.4  0.0 - 0.7 (K/uL)  Basophils Relative 0  0 - 1 (%)    Basophils Absolute 0.0  0.0 - 0.1 (K/uL)   HEPATIC FUNCTION PANEL     Status: Abnormal   Collection Time   10/17/10  1:25 PM      Component Value Range Comment   Total Protein 7.8  6.0 - 8.3 (g/dL)    Albumin 3.2 (*) 3.5 - 5.2 (g/dL)    AST 16  0 - 37 (U/L)    ALT 15  0 - 35 (U/L)    Alkaline Phosphatase 79  39 - 117 (U/L)    Total Bilirubin 0.3  0.3 - 1.2 (mg/dL)    Bilirubin, Direct 0.1  0.0 - 0.3 (mg/dL)    Indirect Bilirubin 0.2 (*) 0.3 - 0.9 (mg/dL)   LIPASE, BLOOD     Status: Normal   Collection Time   10/17/10  1:25 PM      Component Value Range Comment   Lipase 40  11 - 59 (U/L)   LACTIC ACID, PLASMA     Status: Normal   Collection Time   10/17/10  1:25 PM      Component Value Range Comment   Lactic Acid, Venous 0.9  0.5 - 2.2 (mmol/L)   SEDIMENTATION RATE     Status: Normal   Collection Time   10/17/10  1:25 PM      Component Value Range Comment   Sed Rate 16  0 - 22 (mm/hr)   C-REACTIVE PROTEIN     Status: Abnormal   Collection Time   10/17/10  1:25 PM      Component Value Range Comment   CRP 0.4 (*) <0.6 (mg/dL)   COMPREHENSIVE METABOLIC PANEL     Status: Abnormal   Collection Time   10/18/10  4:37 AM      Component Value Range Comment   Sodium 137  135 - 145 (mEq/L)    Potassium 4.2  3.5 - 5.1 (mEq/L)    Chloride 105  96 - 112 (mEq/L)    CO2 19  19 - 32 (mEq/L)    Glucose, Bld 126 (*) 70 - 99 (mg/dL)    BUN 4 (*) 6 - 23 (mg/dL)    Creatinine, Ser 0.56  0.50 - 1.10 (mg/dL)    Calcium 8.7  8.4 - 10.5 (mg/dL)    Total Protein 6.8  6.0 - 8.3 (g/dL)    Albumin 2.8 (*) 3.5 - 5.2 (g/dL)    AST 56 (*) 0 - 37 (U/L)    ALT 73 (*) 0 - 35 (U/L)    Alkaline Phosphatase 134 (*) 39 - 117 (U/L)    Total Bilirubin 0.3  0.3 - 1.2 (mg/dL)    GFR calc non Af Amer >60  >60 (mL/min)    GFR calc Af Amer >60  >60  (mL/min)   CBC     Status: Abnormal   Collection Time   10/18/10  4:37 AM      Component Value Range Comment   WBC 13.7 (*) 4.0 - 10.5 (K/uL)    RBC 4.42  3.87 - 5.11 (MIL/uL)    Hemoglobin 12.6  12.0 - 15.0 (g/dL)    HCT 37.4  36.0 - 46.0 (%)    MCV 84.6  78.0 - 100.0 (fL)    MCH 28.5  26.0 - 34.0 (pg)    MCHC 33.7  30.0 - 36.0 (g/dL)    RDW 14.6  11.5 - 15.5 (%)    Platelets 324  150 -  400 (K/uL)       Hospital Course: This 40 year old lady was admitted with a five-day history of right lower quadrant abdominal pain. This pain was constant with episodes episodes of lessening in severity. It was not associated with nausea or vomiting. There have not been any significant diarrhea, in particular, bloody diarrhea. She does admit to weight loss. CT abdominal scan confirmed the presence of inflammatory changes in the terminal ileum. She was started on intravenous steroids and Dr. Sydell Axon, gastroenterology saw the patient. By the time. Dr. Sydell Axon saw the patient she had improved. She was started on oral steroids with Entocort. Today, she is improved without any abdominal pain, she is tolerating a diet.  Discharge Exam: Blood pressure 105/66, pulse 65, temperature 97.7 F (36.5 C), temperature source Oral, resp. rate 18, height 5' 7"  (1.702 m), weight 73.1 kg (161 lb 2.5 oz), last menstrual period 09/28/2010, SpO2 100.00%. She looks systemically well. Abdomen is soft and nontender. Lung fields are clear. Heart sounds are present and normal.  Disposition: Home. She will followup with Dr. Sydell Axon.  Discharge Orders    Future Appointments: Provider: Department: Dept Phone: Center:   11/15/2010 3:00 PM [place] Provider [place] [phone] None   12/13/2010 3:30 PM Daneil Dolin, MD [facility] [phone] [facility]   02/17/2011 9:00 AM [name], MD [facility] [phone] [facility]     Future Orders Please Complete By Expires   Diet - low sodium heart healthy       Increase activity slowly           Signed: Doree Albee 10/19/2010, 7:56 AM

## 2010-10-19 NOTE — Progress Notes (Signed)
Subjective: C/o heartburn this AM.  Denies any lower abd pain.  Tolerating reg diet well.  No diarrhea or hematochezia.  Denies N/V.  Objective: Vital signs in last 24 hours: Temp:  [97.3 F (36.3 C)-98.4 F (36.9 C)] 97.7 F (36.5 C) (08/14 0426) Pulse Rate:  [65-66] 65  (08/14 0426) Resp:  [18] 18  (08/14 0426) BP: (105-110)/(66-73) 105/66 mmHg (08/14 0426) SpO2:  [98 %-100 %] 100 % (08/14 0426) Last BM Date: 10/18/10 General:   Alert,  Well-developed, well-nourished, pleasant and cooperative in NAD Eyes:  Sclera clear, no icterus.   Conjunctiva pink. Mouth:  No deformity or lesions, OP pink/moist Heart:  Regular rate and rhythm; no murmurs, clicks, rubs,  or gallops. Abdomen:  Soft, nontender and nondistended. No masses, hepatosplenomegaly or hernias noted. Normal bowel sounds, without guarding, and without rebound.   Msk:  Symmetrical without gross deformities. Normal posture. Extremities:  Without clubbing or edema. Neurologic:  Alert and  oriented x4;  grossly normal neurologically. Skin:  Intact without significant lesions or rashes. Psych:  Alert and cooperative. Normal mood and affect.  Lab Results:  Basename 10/18/10 0437 10/17/10 1325  WBC 13.7* 13.7*  HGB 12.6 13.1  HCT 37.4 39.5  PLT 324 326   BMET  Basename 10/18/10 0437 10/17/10 1325  NA 137 135  K 4.2 4.0  CL 105 103  CO2 19 20  GLUCOSE 126* 85  BUN 4* 7  CREATININE 0.56 0.58  CALCIUM 8.7 9.3   LFT  Basename 10/18/10 0437 10/17/10 1325  PROT 6.8 --  ALBUMIN 2.8* --  AST 56* --  ALT 73* --  ALKPHOS 134* --  BILITOT 0.3 --  BILIDIR -- 0.1  IBILI -- 0.2*   Assessment: Crohn's flare:  Improving.  On entocort.  Humira for maintenance. Transaminitis:  Outpt FU planned GERD: Increase po PPI to BID, if no improvement, may require outpatient EGD.   Plan: OK for discharge home Increase outpatient omeprazole to 36m BID (I sent RX to [place] [facility]) LFTS Monday, if remain elevated  MRCP Continue entocort 922mdaily Continue Humira as scheduled FU w/ Dr NeTressie Stalkers planned  Keep FU w/ Dr RoGala Romneyn September   LOS: 2 days   Michele Huger8/14/2012, 9:14 AM

## 2010-10-19 NOTE — Progress Notes (Signed)
UR Chart Review Completed  

## 2010-10-20 ENCOUNTER — Other Ambulatory Visit: Payer: Self-pay | Admitting: Urgent Care

## 2010-10-20 DIAGNOSIS — R7401 Elevation of levels of liver transaminase levels: Secondary | ICD-10-CM

## 2010-10-20 NOTE — Progress Notes (Signed)
Lab order in mail to pt. JL      Sent: Mon October 18, 2010  9:46 AM    To: Marylou Mccoy, LPN    Cc: Vickey Huger, NP        [name]    MRN: [id number] DOB: [birth year]     Pt Work: [phone] Pt Home: [phone]    Message     Pt needs LFTs 1 week [id number] Inpatient, should be d/c'd home soon Thanks

## 2010-10-25 ENCOUNTER — Other Ambulatory Visit: Payer: Self-pay | Admitting: Urgent Care

## 2010-10-26 ENCOUNTER — Encounter: Payer: Self-pay | Admitting: Gastroenterology

## 2010-10-26 ENCOUNTER — Ambulatory Visit (INDEPENDENT_AMBULATORY_CARE_PROVIDER_SITE_OTHER): Payer: PRIVATE HEALTH INSURANCE | Admitting: Gastroenterology

## 2010-10-26 VITALS — BP 111/78 | HR 80 | Temp 97.6°F | Ht 68.0 in | Wt 155.4 lb

## 2010-10-26 DIAGNOSIS — Z713 Dietary counseling and surveillance: Secondary | ICD-10-CM

## 2010-10-26 DIAGNOSIS — R7401 Elevation of levels of liver transaminase levels: Secondary | ICD-10-CM

## 2010-10-26 DIAGNOSIS — IMO0001 Reserved for inherently not codable concepts without codable children: Secondary | ICD-10-CM | POA: Insufficient documentation

## 2010-10-26 DIAGNOSIS — R945 Abnormal results of liver function studies: Secondary | ICD-10-CM | POA: Insufficient documentation

## 2010-10-26 DIAGNOSIS — K5090 Crohn's disease, unspecified, without complications: Secondary | ICD-10-CM

## 2010-10-26 DIAGNOSIS — K219 Gastro-esophageal reflux disease without esophagitis: Secondary | ICD-10-CM

## 2010-10-26 DIAGNOSIS — R7989 Other specified abnormal findings of blood chemistry: Secondary | ICD-10-CM

## 2010-10-26 DIAGNOSIS — R748 Abnormal levels of other serum enzymes: Secondary | ICD-10-CM

## 2010-10-26 LAB — HEPATIC FUNCTION PANEL
ALT: 17 U/L (ref 0–35)
AST: 15 U/L (ref 0–37)
Alkaline Phosphatase: 74 U/L (ref 39–117)
Indirect Bilirubin: 0.2 mg/dL (ref 0.0–0.9)
Total Protein: 7.4 g/dL (ref 6.0–8.3)

## 2010-10-26 MED ORDER — BUDESONIDE 3 MG PO CP24: 6.0000 mg | ORAL_CAPSULE | Freq: Every day | ORAL | Status: DC

## 2010-10-26 MED ORDER — HYDROCODONE-ACETAMINOPHEN 7.5-300 MG PO TABS: 1.0000 | ORAL_TABLET | ORAL | Status: DC | PRN

## 2010-10-26 NOTE — Assessment & Plan Note (Signed)
Better on omeprazole bid.

## 2010-10-26 NOTE — Assessment & Plan Note (Addendum)
Active inflammation and wall thickening at neoterminal ileum. RLQ pain due to active Crohn's. Continue Humira. Dose reduced from 9m to 437mat some point in remote past. Continue Entocort for now with decrease to 6m52maily until next OV. There has been shortage in Entocort so hopefully this will be possible to continue.   See patient instructions. Will also discuss with Dr. RouGala Romneygarding timing of next colonoscopy. Keep OV with Dr. RouGala Romney 12/13/10.

## 2010-10-26 NOTE — Assessment & Plan Note (Signed)
TSH okay 09/2010. Encourage three meals and two snacks daily. Check weight at home today and in two weeks. To discuss further with Dr. Gala Romney.

## 2010-10-26 NOTE — Progress Notes (Signed)
Primary Care Physician: [name], MD, MD  Primary Gastroenterologist:  Garfield Cornea, MD   Chief Complaint  Patient presents with  . Follow-up    HPI: [name] is a 40 y.o. female here for f/u recent hospitalization. Went to hospital with RLQ pain.  On Humira q 2 weeks for maintenance of Crohn's, has been on for several years. CT showed active Crohn's at neoterminal ileum. AST/ALT/ALP up single time. Prior cholecystectomy. Liver ok on CT.   Also with h/o chronic leukocytosis. Appt with Dr. Tressie Stalker in near future.   C/O ongoing RLQ pain. Very worried about unintentional weight loss. Weight 181 one year ago. Weight 168 now. Feels like eating adequately. BF toast and cereal. Supper, grilled hamburger. Skipped lunch yesterday. Drinking water and juice. Avoids caffeine.   RLQ pain coming back. Wake up early AM with it. Pain intermittent. BM 3 loose daily. No blood in stool. Subjective chills.     Current Outpatient Prescriptions  Medication Sig Dispense Refill  . acetaminophen (TYLENOL) 500 MG tablet Take 500 mg by mouth as needed. For pain      . acyclovir (ZOVIRAX) 5 % ointment Apply topically. APPLY as needed to rash      . adalimumab (HUMIRA) 40 MG/0.8ML injection Inject 0.8 mLs (40 mg total) into the skin once. GIVE 40 MG (0.8ML) [place] EVERY 14 DAYS  12 each  0  . budesonide (ENTOCORT EC) 3 MG 24 hr capsule Take 2 capsules (6 mg total) by mouth daily.  60 capsule  2  . Cyanocobalamin (B-12) 1000 MCG CAPS Take 1 capsule by mouth daily.        . diphenhydrAMINE (BENADRYL) 25 mg capsule Take 25 mg by mouth daily as needed. For allergies       . ketoconazole (NIZORAL) 2 % cream Apply topically daily. APPLY TO FOOT TWO TIMES A DAY       . Multiple Vitamins-Minerals (CENTRUM) tablet Take 1 tablet by mouth daily.  30 tablet  0  . omeprazole (PRILOSEC) 20 MG capsule Take 1 capsule (20 mg total) by mouth 2 (two) times daily before a meal.  62 capsule  11  . potassium chloride SA  (K-DUR,KLOR-CON) 20 MEQ tablet Take 1 tablet (20 mEq total) by mouth 2 (two) times daily.  60 tablet  11  . Hydrocodone-Acetaminophen 7.5-300 MG TABS Take 1 capsule by mouth every 4 (four) hours as needed.  40 each  0    Allergies as of 10/26/2010 - Review Complete 10/26/2010  Allergen Reaction Noted  . Oxycodone-acetaminophen Itching 12/29/2005  . Tramadol hcl Itching   . Tylox Itching 10/17/2010    ROS:  General: Negative for anorexia,  fever, fatigue, weakness. ENT: Negative for hoarseness, difficulty swallowing , nasal congestion. CV: Negative for chest pain, angina, palpitations, dyspnea on exertion, peripheral edema.  Respiratory: Negative for dyspnea at rest, dyspnea on exertion, cough, sputum, wheezing.  GI: See history of present illness. GU:  Negative for dysuria, hematuria, urinary incontinence, urinary frequency, nocturnal urination.  Endo:  See HPI.    Physical Examination:   BP 111/78  Pulse 80  Temp(Src) 97.6 F (36.4 C) (Temporal)  Ht 5' 8"  (1.727 m)  Wt 155 lb 6.4 oz (70.489 kg)  BMI 23.63 kg/m2  LMP 09/28/2010  General: Well-nourished, well-developed in no acute distress.  Eyes: No icterus. Mouth: Oropharyngeal mucosa moist and pink , no lesions erythema or exudate. Lungs: Clear to auscultation bilaterally.  Heart: Regular rate and rhythm, no murmurs rubs or  gallops.  Abdomen: Bowel sounds are normal, mild rlq tenderness, nondistended, no hepatosplenomegaly or masses, no abdominal bruits or hernia , no rebound or guarding.   Extremities: No lower extremity edema. No clubbing or deformities. Neuro: Alert and oriented x 4   Skin: Warm and dry, no jaundice.   Psych: Alert and cooperative, normal mood and affect.  Labs:   Results for Michele, [name] (MRN [id number]) as of 10/26/2010 11:01  Ref. Range 10/17/2010 13:25 10/18/2010 04:37 10/25/2010 12:05  Alkaline Phosphatase Latest Range: 39-117 U/L 79 134 (H) 74  Albumin Latest Range: 3.5-5.2 g/dL 3.2 (L) 2.8 (L)  4.0  Lipase Latest Range: 11-59 U/L 40    AST Latest Range: 0-37 U/L 16 56 (H) 15  ALT Latest Range: 0-35 U/L 15 73 (H) 17  Total Protein Latest Range: 6.0-8.3 g/dL 7.8 6.8 7.4  Bilirubin, Direct Latest Range: 0.0-0.3 mg/dL 0.1  0.1  Indirect Bilirubin Latest Range: 0.0-0.9 mg/dL 0.2 (L)  0.2  Total Bilirubin Latest Range: 0.3-1.2 mg/dL 0.3 0.3 0.3    Imaging Studies: Ct Abdomen Pelvis W Contrast  10/17/2010  *RADIOLOGY REPORT*  Clinical Data: Right lower quadrant abdominal pain, history of Crohn disease and kidney stones.  Status post cholecystectomy and appendectomy.  CT ABDOMEN AND PELVIS WITH CONTRAST  Technique:  Multidetector CT imaging of the abdomen and pelvis was performed following the standard protocol during bolus administration of intravenous contrast.  Contrast: 100 ml Omnipaque-300 IV  Comparison: 02/20/2009  Findings: Lung bases are clear.  Liver, spleen, pancreas, and adrenal glands within normal limits.  Status post cholecystectomy.  No intrahepatic or extrahepatic ductal dilatation.  Bilateral renal cysts.  No hydronephrosis.  No evidence of bowel obstruction.  Prior ileocecal resection.  Wall thickening and inflammatory changes involving the neoterminal ileum (for example, series 2/image 56), compatible with active inflammatory Crohn's disease. No drainable fluid collection or abscess.  No findings to suggest fixed narrowing or stricture.  No evidence of abdominal aortic aneurysm.  Uterus and bilateral ovaries are unremarkable, noting a 2.5 cm right ovarian cyst / follicle.  Trace pelvic fluid, likely physiologic.  Visualized osseous structures are within normal limits.  No sacroiliitis.  IMPRESSION: Prior ileocecal resection.  Wall thickening/inflammatory changes involving the neoterminal ileum, compatible with active inflammatory Crohn disease.  No drainable fluid collection or abscess.  No fixed narrowing or stricture.  Original Report Authenticated By: Julian Hy, M.D.

## 2010-10-26 NOTE — Assessment & Plan Note (Signed)
Recent LFTs normal. Recheck in two months.

## 2010-10-26 NOTE — Progress Notes (Signed)
Cc to PCP 

## 2010-10-26 NOTE — Patient Instructions (Signed)
Please have your liver blood work done the day before you come back to see Dr. Gala Romney (appt 12/13/10). Please keep your appt with Dr. Tressie Stalker. Weigh yourself at home today and in two weeks and call us with report. Please make sure you eat three meals daily and two snacks. Continue Entocort 93m daily until you have the med refilled. Your refill will be for 653mdaily. Further taper to be given at time of your OV on 12/13/10. Please call if your abdominal pain does not improve or gets worse.  Please call if you have temperature >101F. I will discuss your weight loss with Dr. RoGala Romneynd review when your last colonoscopy was and make further recommendations.

## 2010-10-27 NOTE — Progress Notes (Signed)
Quick Note:  Pt aware ______ 

## 2010-11-10 ENCOUNTER — Ambulatory Visit (HOSPITAL_COMMUNITY): Payer: PRIVATE HEALTH INSURANCE | Admitting: Oncology

## 2010-11-12 ENCOUNTER — Ambulatory Visit: Payer: PRIVATE HEALTH INSURANCE | Admitting: Internal Medicine

## 2010-11-15 ENCOUNTER — Ambulatory Visit (HOSPITAL_COMMUNITY)
Admission: RE | Admit: 2010-11-15 | Discharge: 2010-11-15 | Disposition: A | Discharge status: Home / Self Care | Payer: PRIVATE HEALTH INSURANCE | Source: Ambulatory Visit | Attending: Oncology | Admitting: Oncology

## 2010-11-15 ENCOUNTER — Encounter (HOSPITAL_COMMUNITY): Payer: PRIVATE HEALTH INSURANCE | Attending: Hematology and Oncology

## 2010-11-15 ENCOUNTER — Other Ambulatory Visit (HOSPITAL_COMMUNITY): Payer: Self-pay | Admitting: Oncology

## 2010-11-15 ENCOUNTER — Encounter (HOSPITAL_COMMUNITY): Payer: Self-pay

## 2010-11-15 VITALS — BP 116/79 | HR 83 | Temp 98.8°F | Ht 67.5 in | Wt 154.4 lb

## 2010-11-15 DIAGNOSIS — Z79899 Other long term (current) drug therapy: Secondary | ICD-10-CM | POA: Insufficient documentation

## 2010-11-15 DIAGNOSIS — R05 Cough: Secondary | ICD-10-CM | POA: Insufficient documentation

## 2010-11-15 DIAGNOSIS — R0609 Other forms of dyspnea: Secondary | ICD-10-CM | POA: Insufficient documentation

## 2010-11-15 DIAGNOSIS — R0600 Dyspnea, unspecified: Secondary | ICD-10-CM

## 2010-11-15 DIAGNOSIS — D72829 Elevated white blood cell count, unspecified: Secondary | ICD-10-CM

## 2010-11-15 DIAGNOSIS — F17200 Nicotine dependence, unspecified, uncomplicated: Secondary | ICD-10-CM | POA: Insufficient documentation

## 2010-11-15 DIAGNOSIS — R059 Cough, unspecified: Secondary | ICD-10-CM | POA: Insufficient documentation

## 2010-11-15 DIAGNOSIS — D72820 Lymphocytosis (symptomatic): Secondary | ICD-10-CM

## 2010-11-15 DIAGNOSIS — R079 Chest pain, unspecified: Secondary | ICD-10-CM | POA: Insufficient documentation

## 2010-11-15 DIAGNOSIS — K5090 Crohn's disease, unspecified, without complications: Secondary | ICD-10-CM

## 2010-11-15 DIAGNOSIS — R0989 Other specified symptoms and signs involving the circulatory and respiratory systems: Secondary | ICD-10-CM | POA: Insufficient documentation

## 2010-11-15 LAB — CBC
Hemoglobin: 13.6 g/dL (ref 12.0–15.0)
MCH: 28.8 pg (ref 26.0–34.0)
MCHC: 33.6 g/dL (ref 30.0–36.0)
RDW: 15 % (ref 11.5–15.5)

## 2010-11-15 LAB — COMPREHENSIVE METABOLIC PANEL
Albumin: 3.3 g/dL — ABNORMAL LOW (ref 3.5–5.2)
BUN: 6 mg/dL (ref 6–23)
Creatinine, Ser: 0.56 mg/dL (ref 0.50–1.10)
Potassium: 3.7 mEq/L (ref 3.5–5.1)
Total Protein: 7.1 g/dL (ref 6.0–8.3)

## 2010-11-15 LAB — DIFFERENTIAL
Basophils Relative: 0 % (ref 0–1)
Eosinophils Absolute: 0.3 10*3/uL (ref 0.0–0.7)
Monocytes Absolute: 0.8 10*3/uL (ref 0.1–1.0)
Monocytes Relative: 6 % (ref 3–12)
Neutrophils Relative %: 61 % (ref 43–77)

## 2010-11-15 LAB — LACTATE DEHYDROGENASE: LDH: 198 U/L (ref 94–250)

## 2010-11-15 NOTE — Progress Notes (Signed)
Patient stuck numerous times for labs. Sites wnl. Pt tolerated well.

## 2010-11-15 NOTE — Progress Notes (Signed)
Addended by: Gerhard Perches on: 11/15/2010 05:20 PM   Modules accepted: Orders

## 2010-11-15 NOTE — Progress Notes (Signed)
This office note has been dictated.
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
## 2010-11-16 LAB — IGG, IGA, IGM
IgA: 401 mg/dL — ABNORMAL HIGH (ref 69–380)
IgG (Immunoglobin G), Serum: 1110 mg/dL (ref 690–1700)
IgM, Serum: 214 mg/dL (ref 52–322)

## 2010-11-17 LAB — IMMUNOFIXATION ELECTROPHORESIS: Total Protein ELP: 6.8 g/dL (ref 6.0–8.3)

## 2010-11-17 LAB — PROTEIN ELECTROPHORESIS, SERUM
Albumin ELP: 51.5 % — ABNORMAL LOW (ref 55.8–66.1)
Alpha-1-Globulin: 4.6 % (ref 2.9–4.9)

## 2010-11-19 ENCOUNTER — Telehealth: Payer: Self-pay | Admitting: Gastroenterology

## 2010-11-19 NOTE — Telephone Encounter (Signed)
Patient was supposed to call us with a weight. Please have her weigh at home and give [place] value.   Please ask her if she receives B12 injections or just takes oral supplements.   Her last colonoscopy was in 2007, Dr. Verl Blalock, she can discuss with RMR at next [place] in 10/12 about when he wants the next one done.   Keep OV with RMR for 12/13/10 as planned.

## 2010-11-22 ENCOUNTER — Telehealth: Payer: Self-pay | Admitting: Urgent Care

## 2010-11-22 NOTE — Telephone Encounter (Signed)
Please call the patient to see if she has had CBC drawn.

## 2010-11-23 NOTE — Telephone Encounter (Signed)
Tried to call pt- NA. Mailed reminder letter.

## 2010-11-24 ENCOUNTER — Encounter (HOSPITAL_BASED_OUTPATIENT_CLINIC_OR_DEPARTMENT_OTHER): Payer: PRIVATE HEALTH INSURANCE | Admitting: Oncology

## 2010-11-24 VITALS — BP 122/85 | HR 75 | Temp 98.1°F | Wt 153.8 lb

## 2010-11-24 DIAGNOSIS — D72829 Elevated white blood cell count, unspecified: Secondary | ICD-10-CM

## 2010-11-24 LAB — BASIC METABOLIC PANEL
Calcium: 9.8
Creatinine, Ser: 0.93
GFR calc Af Amer: >60
GFR calc non Af Amer: >60
Sodium: 137

## 2010-11-24 LAB — DIFFERENTIAL
Basophils Absolute: 0.1
Basophils Relative: 1
Eosinophils Absolute: 0
Eosinophils Relative: 0
Lymphocytes Relative: 16
Lymphs Abs: 2.8
Monocytes Absolute: 0.4
Monocytes Relative: 2 — ABNORMAL LOW
Neutro Abs: 14.2 — ABNORMAL HIGH
Neutrophils Relative %: 81 — ABNORMAL HIGH

## 2010-11-24 LAB — HCG, QUANTITATIVE, PREGNANCY: hCG, Beta Chain, Quant, S: <2

## 2010-11-24 LAB — CBC
Hemoglobin: 15.4 — ABNORMAL HIGH
MCHC: 33.5
RBC: 5.49 — ABNORMAL HIGH
WBC: 17.5 — ABNORMAL HIGH
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
## 2010-11-24 NOTE — Patient Instructions (Signed)
[facility]  Discharge Instructions  RECOMMENDATIONS MADE BY THE CONSULTANT AND ANY TEST RESULTS WILL BE SENT TO YOUR REFERRING DOCTOR.   EXAM FINDINGS BY MD TODAY AND SIGNS AND SYMPTOMS TO REPORT TO CLINIC OR PRIMARY MD:    INSTRUCTIONS GIVEN AND DISCUSSED: Return to clinic in 3 months for lab work then to see MD.     I acknowledge that I have been informed and understand all the instructions given to me and received a copy. I do not have any more questions at this time, but understand that I may call the [facility] at [facility] at [phone] during business hours should I have any further questions or need assistance in obtaining follow-up care.    __________________________________________  _____________  __________ Signature of Patient or Authorized Representative            Date                   Time    __________________________________________ Nurse's Signature

## 2010-11-24 NOTE — Progress Notes (Signed)
Pt  Has OV w/ RMR 10/12. CBC stable on 11/15/10

## 2010-11-25 NOTE — Telephone Encounter (Signed)
Has patient been contacted?

## 2010-11-30 NOTE — Telephone Encounter (Signed)
Tried to call pt- LMOM 

## 2010-12-01 LAB — COMPREHENSIVE METABOLIC PANEL
ALT: 20
Albumin: 3.3 — ABNORMAL LOW
Alkaline Phosphatase: 51
BUN: 7
Chloride: 107
Glucose, Bld: 98
Potassium: 3.4 — ABNORMAL LOW
Sodium: 139
Total Bilirubin: 0.2 — ABNORMAL LOW

## 2010-12-01 LAB — CBC
HCT: 37
Hemoglobin: 12.6
WBC: 12.6 — ABNORMAL HIGH

## 2010-12-01 LAB — DIFFERENTIAL
Basophils Absolute: 0
Basophils Relative: 0
Eosinophils Absolute: 0.6
Monocytes Absolute: 0.7
Monocytes Relative: 6
Neutro Abs: 5.7
Neutrophils Relative %: 45

## 2010-12-01 LAB — ETHANOL: Alcohol, Ethyl (B): <5

## 2010-12-01 LAB — RAPID URINE DRUG SCREEN, HOSP PERFORMED: Barbiturates: NOT DETECTED

## 2010-12-01 LAB — ACETAMINOPHEN LEVEL: Acetaminophen (Tylenol), Serum: 24.6

## 2010-12-01 LAB — POCT CARDIAC MARKERS: Myoglobin, poc: 56.5

## 2010-12-03 NOTE — Telephone Encounter (Signed)
Tried to call pt- LMOM 

## 2010-12-03 NOTE — Telephone Encounter (Signed)
Spoke with pt her weight is 154 lbs and she is taking B12 capsules. She is aware of her appt with RMR

## 2010-12-06 LAB — DIFFERENTIAL
Basophils Absolute: 0
Basophils Absolute: 0.1
Basophils Relative: 0
Basophils Relative: 1
Eosinophils Absolute: 0
Eosinophils Absolute: 0
Eosinophils Relative: 0
Eosinophils Relative: 2
Lymphocytes Relative: 17
Lymphocytes Relative: 41
Lymphs Abs: 2.5
Lymphs Abs: 7.1 — ABNORMAL HIGH
Monocytes Absolute: 0.3
Monocytes Absolute: 1.1 — ABNORMAL HIGH
Monocytes Relative: 2 — ABNORMAL LOW
Monocytes Relative: 4
Monocytes Relative: 6
Neutro Abs: 12.3 — ABNORMAL HIGH
Neutro Abs: 12.5 — ABNORMAL HIGH
Neutrophils Relative %: 74
Neutrophils Relative %: 81 — ABNORMAL HIGH

## 2010-12-06 LAB — CBC
HCT: 34.7 — ABNORMAL LOW
HCT: 37.4
HCT: 37.7
Hemoglobin: 11.7 — ABNORMAL LOW
Hemoglobin: 12.6
Hemoglobin: 12.8
MCHC: 33.8
MCV: 84.3
Platelets: 320
RBC: 4.13
RBC: 4.44
RBC: 4.53
RDW: 15.7 — ABNORMAL HIGH
WBC: 15.2 — ABNORMAL HIGH
WBC: 16.8 — ABNORMAL HIGH
WBC: 17.6 — ABNORMAL HIGH

## 2010-12-06 LAB — BASIC METABOLIC PANEL
BUN: 6
CO2: 24
Calcium: 8.7
Chloride: 110
Creatinine, Ser: 0.74
GFR calc Af Amer: >60
GFR calc non Af Amer: >60
GFR calc non Af Amer: >60
Glucose, Bld: 101 — ABNORMAL HIGH
Potassium: 3.6
Potassium: 4.2
Sodium: 140
Sodium: 142

## 2010-12-06 LAB — URINALYSIS, ROUTINE W REFLEX MICROSCOPIC
Glucose, UA: NEGATIVE
Ketones, ur: NEGATIVE
Nitrite: NEGATIVE
Specific Gravity, Urine: 1.01
pH: 6.5

## 2010-12-06 LAB — COMPREHENSIVE METABOLIC PANEL
ALT: 16
Alkaline Phosphatase: 60
BUN: 4 — ABNORMAL LOW
CO2: 26
Chloride: 109
Glucose, Bld: 144 — ABNORMAL HIGH
Potassium: 4.1
Sodium: 139
Total Bilirubin: 0.6
Total Protein: 6.7

## 2010-12-06 LAB — PREGNANCY, URINE: Preg Test, Ur: NEGATIVE

## 2010-12-06 NOTE — Telephone Encounter (Signed)
Weight keeps dropping. It is imperative that she keeps appt with Dr. Gala Romney as scheduled for next week.  Need to check B12 level.   Patient needs to address weight loss and timing of next TCS with RMR at OV.

## 2010-12-09 ENCOUNTER — Other Ambulatory Visit: Payer: Self-pay | Admitting: Gastroenterology

## 2010-12-09 DIAGNOSIS — E538 Deficiency of other specified B group vitamins: Secondary | ICD-10-CM

## 2010-12-09 DIAGNOSIS — K5090 Crohn's disease, unspecified, without complications: Secondary | ICD-10-CM

## 2010-12-09 NOTE — Telephone Encounter (Signed)
Tried to call pt- NA on cell number. LM with female on home number. Lab order faxed to lab.

## 2010-12-10 NOTE — Telephone Encounter (Signed)
Tried to call pt- number was busy

## 2010-12-13 ENCOUNTER — Encounter: Payer: Self-pay | Admitting: Internal Medicine

## 2010-12-13 ENCOUNTER — Ambulatory Visit (INDEPENDENT_AMBULATORY_CARE_PROVIDER_SITE_OTHER): Payer: PRIVATE HEALTH INSURANCE | Admitting: Internal Medicine

## 2010-12-13 VITALS — BP 95/60 | HR 70 | Temp 97.4°F | Ht 67.0 in | Wt 160.4 lb

## 2010-12-13 DIAGNOSIS — K5090 Crohn's disease, unspecified, without complications: Secondary | ICD-10-CM

## 2010-12-13 DIAGNOSIS — K5080 Crohn's disease of both small and large intestine without complications: Secondary | ICD-10-CM

## 2010-12-13 DIAGNOSIS — K50813 Crohn's disease of both small and large intestine with fistula: Secondary | ICD-10-CM

## 2010-12-13 NOTE — Patient Instructions (Signed)
Continue Humira 40 mg every 2 weeks  Decrease Entocort to 3 mg daily for the next 8 weeks and then stop  Smoking cessation recommended.  Office visit here in 3 months

## 2010-12-13 NOTE — Telephone Encounter (Signed)
Pt has appt with RMR today.
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
## 2010-12-13 NOTE — Assessment & Plan Note (Signed)
Overall, [name] is doing very well from a standpoint of her ileocolonic Crohn's disease. She is a good 11 or 12 years into the disease process. Doing well with Humira and low-dose Entocort.  Weight loss nonspecific. No alarm features otherwise.  Recommendations: Continued Humira decrease Entocort 3 mg once daily for 8 weeks and stopped completely  Office visit with [place] in 3 months.  We'll look into the timing of her next colonoscopy when she returns to see [place].

## 2011-01-25 ENCOUNTER — Encounter: Payer: Self-pay | Admitting: Family Medicine

## 2011-01-25 ENCOUNTER — Ambulatory Visit (INDEPENDENT_AMBULATORY_CARE_PROVIDER_SITE_OTHER): Payer: PRIVATE HEALTH INSURANCE | Admitting: Family Medicine

## 2011-01-25 VITALS — BP 110/80 | HR 94 | Resp 16 | Ht 67.0 in | Wt 150.0 lb

## 2011-01-25 DIAGNOSIS — B009 Herpesviral infection, unspecified: Secondary | ICD-10-CM

## 2011-01-25 MED ORDER — DIPHENHYDRAMINE HCL 50 MG/ML IJ SOLN
25.0000 mg | Freq: Once | INTRAMUSCULAR | Status: AC
  Administered 2011-01-25: 25 mg via INTRAMUSCULAR

## 2011-01-25 MED ORDER — HYDROXYZINE HCL 10 MG PO TABS: 10.0000 mg | ORAL_TABLET | Freq: Three times a day (TID) | ORAL | Status: AC | PRN

## 2011-01-25 MED ORDER — ACYCLOVIR 200 MG PO CAPS: ORAL_CAPSULE | ORAL | Status: DC

## 2011-01-25 MED ORDER — CEPHALEXIN 500 MG PO CAPS: 500.0000 mg | ORAL_CAPSULE | Freq: Two times a day (BID) | ORAL | Status: AC

## 2011-01-25 NOTE — Assessment & Plan Note (Signed)
Acute flare of type 1 herpes, will prescribe acyclovir

## 2011-01-25 NOTE — Progress Notes (Signed)
Addended by: Eual Fines on: 01/25/2011 01:14 PM   Modules accepted: Orders

## 2011-01-25 NOTE — Progress Notes (Signed)
  Subjective:    Patient ID: [name], female    DOB: [birth year], 40 y.o.   MRN: [id number]  HPI  2 day h/o painful swelling of left upper lip with tingling. Has established HSV type 1. No new foods or cosmetics as aggravants. C/o tender right neck glands x 2 days , no other complaints  Review of Systems See HPI Denies recent fever or chills. Denies sinus pressure, nasal congestion, ear pain or sore throat. Denies chest congestion, productive cough or wheezing. DDenies headaches, seizures, numbness, or tingling. Denies depression, anxiety or insomnia.         Objective:   Physical Exam Patient alert and oriented and in no cardiopulmonary distress.  HEENT: No facial asymmetry, EOMI, no sinus tenderness, .  Neck supple right anterior cervical  Adenopathy.Right upper lip swollen with vesicles which are  Crusted, no evdence of airway obstructio , lungs clear  Chest: Clear to auscultation bilaterally.  CVS: S1, S2 no murmurs, no S3.  ABD: Soft non tender. Bowel sounds normal.  Ext: No edema  MS: Adequate ROM spine, shoulders, hips and knees.  Skin: Intact, no ulcerations or rash noted.  Psych: Good eye contact, normal affect. Memory intact not anxious or depressed appearing.  CNS: CN 2-12 intact, power, tone and sensation normal throughout.        Assessment & Plan:

## 2011-01-25 NOTE — Patient Instructions (Signed)
F/u as before.  You are being treated for an outbreak of type 1 herpes , which you have had in the past.  Please keep your hands off the rash.   Benadryl 31m IM is being administered  In the office

## 2011-02-03 ENCOUNTER — Telehealth: Payer: Self-pay | Admitting: Family Medicine

## 2011-02-04 NOTE — Telephone Encounter (Signed)
Noted and agree. 

## 2011-02-11 ENCOUNTER — Encounter: Payer: Self-pay | Admitting: Family Medicine

## 2011-02-11 ENCOUNTER — Telehealth: Payer: Self-pay | Admitting: Family Medicine

## 2011-02-11 NOTE — Telephone Encounter (Signed)
Noted and agree. 

## 2011-02-13 ENCOUNTER — Encounter (HOSPITAL_COMMUNITY): Payer: Self-pay

## 2011-02-13 DIAGNOSIS — J45909 Unspecified asthma, uncomplicated: Secondary | ICD-10-CM | POA: Insufficient documentation

## 2011-02-13 DIAGNOSIS — F17200 Nicotine dependence, unspecified, uncomplicated: Secondary | ICD-10-CM | POA: Insufficient documentation

## 2011-02-13 DIAGNOSIS — R51 Headache: Secondary | ICD-10-CM | POA: Insufficient documentation

## 2011-02-13 DIAGNOSIS — F319 Bipolar disorder, unspecified: Secondary | ICD-10-CM | POA: Insufficient documentation

## 2011-02-13 DIAGNOSIS — K5090 Crohn's disease, unspecified, without complications: Secondary | ICD-10-CM | POA: Insufficient documentation

## 2011-02-13 DIAGNOSIS — K219 Gastro-esophageal reflux disease without esophagitis: Secondary | ICD-10-CM | POA: Insufficient documentation

## 2011-02-13 NOTE — ED Notes (Signed)
Pt presents with headache that has been intermittent for 3 weeks. Pt states she is scared to go to sleep because the pain is "strange".

## 2011-02-14 ENCOUNTER — Ambulatory Visit: Payer: PRIVATE HEALTH INSURANCE | Admitting: Family Medicine

## 2011-02-14 ENCOUNTER — Emergency Department (HOSPITAL_COMMUNITY)
Admission: EM | Admit: 2011-02-14 | Discharge: 2011-02-14 | Disposition: A | Discharge status: Home / Self Care | Payer: Medicare Other | Attending: Emergency Medicine | Admitting: Emergency Medicine

## 2011-02-14 DIAGNOSIS — R51 Headache: Secondary | ICD-10-CM

## 2011-02-14 MED ORDER — HYDROCODONE-ACETAMINOPHEN 5-500 MG PO TABS: 1.0000 | ORAL_TABLET | Freq: Four times a day (QID) | ORAL | Status: AC | PRN

## 2011-02-14 MED ORDER — METOCLOPRAMIDE HCL 5 MG/ML IJ SOLN
10.0000 mg | Freq: Once | INTRAMUSCULAR | Status: AC
  Administered 2011-02-14: 10 mg via INTRAVENOUS
  Filled 2011-02-14: qty 2

## 2011-02-14 MED ORDER — HYDROMORPHONE HCL PF 1 MG/ML IJ SOLN
1.0000 mg | Freq: Once | INTRAMUSCULAR | Status: AC
  Administered 2011-02-14: 1 mg via INTRAVENOUS
  Filled 2011-02-14: qty 1

## 2011-02-14 MED ORDER — KETOROLAC TROMETHAMINE 30 MG/ML IJ SOLN
30.0000 mg | Freq: Once | INTRAMUSCULAR | Status: AC
  Administered 2011-02-14: 30 mg via INTRAVENOUS
  Filled 2011-02-14: qty 1

## 2011-02-14 NOTE — ED Notes (Signed)
c/o ha more on R side of head; worse headache ever had; no dizziness reported per pt; headache increased  this pm
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
## 2011-02-14 NOTE — ED Notes (Signed)
Pt ambulatory and stable at discharge

## 2011-02-17 ENCOUNTER — Ambulatory Visit: Payer: PRIVATE HEALTH INSURANCE | Admitting: Family Medicine

## 2011-03-10 ENCOUNTER — Encounter: Payer: Self-pay | Admitting: Gastroenterology

## 2011-03-15 ENCOUNTER — Encounter: Payer: Self-pay | Admitting: Gastroenterology

## 2011-03-15 ENCOUNTER — Ambulatory Visit (INDEPENDENT_AMBULATORY_CARE_PROVIDER_SITE_OTHER): Payer: Medicare Other | Admitting: Gastroenterology

## 2011-03-15 VITALS — BP 102/65 | HR 95 | Temp 97.5°F | Ht 67.0 in | Wt 155.6 lb

## 2011-03-15 DIAGNOSIS — K5090 Crohn's disease, unspecified, without complications: Secondary | ICD-10-CM

## 2011-03-15 DIAGNOSIS — Z9189 Other specified personal risk factors, not elsewhere classified: Secondary | ICD-10-CM

## 2011-03-15 DIAGNOSIS — Z789 Other specified health status: Secondary | ICD-10-CM
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
## 2011-03-15 NOTE — Assessment & Plan Note (Addendum)
Doing well. Weight has stabilized. Her last colonoscopy was approximately 5 years ago. Diagnoses with Crohn's disease in 2001. H/O ulcers at anastomosis previously. TCS to screen for colon cancer.  I have discussed the risks, alternatives, benefits with regards to but not limited to the risk of reaction to medication, bleeding, infection, perforation and the patient is agreeable to proceed. Written consent to be obtained.  OV in four months.      Patient's medical record as documented. Her office visit earlier this month appears inaccurate. Patient states she finished up Entocort in early December. Documentation from her recent office note seems to imply that she's continues to take it

## 2011-03-15 NOTE — Progress Notes (Signed)
Cc to PCP 

## 2011-03-15 NOTE — Patient Instructions (Signed)
We have scheduled you for a colonoscopy. Please see separate instructions. Congratulations on your desire to quit smoking. Keep up the good work. Office visit in four months.

## 2011-03-16 ENCOUNTER — Other Ambulatory Visit: Payer: Self-pay

## 2011-03-16 MED ORDER — ADALIMUMAB 40 MG/0.8ML ~~LOC~~ KIT: 40.0000 mg | PACK | Freq: Once | SUBCUTANEOUS | Status: DC

## 2011-03-21 ENCOUNTER — Encounter (HOSPITAL_COMMUNITY): Payer: Medicare Other | Attending: Oncology

## 2011-03-21 DIAGNOSIS — D72829 Elevated white blood cell count, unspecified: Secondary | ICD-10-CM | POA: Insufficient documentation

## 2011-03-21 LAB — CBC
MCH: 28.6 pg (ref 26.0–34.0)
Platelets: 369 10*3/uL (ref 150–400)
RBC: 4.86 MIL/uL (ref 3.87–5.11)
RDW: 14.1 % (ref 11.5–15.5)
WBC: 15.6 10*3/uL — ABNORMAL HIGH (ref 4.0–10.5)

## 2011-03-21 LAB — COMPREHENSIVE METABOLIC PANEL
ALT: 9 U/L (ref 0–35)
AST: 13 U/L (ref 0–37)
Alkaline Phosphatase: 70 U/L (ref 39–117)
GFR calc Af Amer: >90 mL/min (ref >90)
Glucose, Bld: 81 mg/dL (ref 70–99)
Potassium: 4.3 mEq/L (ref 3.5–5.1)
Sodium: 140 mEq/L (ref 135–145)
Total Protein: 7.7 g/dL (ref 6.0–8.3)

## 2011-03-21 LAB — DIFFERENTIAL
Basophils Absolute: 0 10*3/uL (ref 0.0–0.1)
Eosinophils Relative: 4 % (ref 0–5)
Monocytes Absolute: 0.8 10*3/uL (ref 0.1–1.0)
Monocytes Relative: 5 % (ref 3–12)
Neutrophils Relative %: 51 % (ref 43–77)

## 2011-03-21 NOTE — Progress Notes (Signed)
Labs drawn today for cbc,diff,cmp

## 2011-03-22 ENCOUNTER — Encounter (HOSPITAL_COMMUNITY): Payer: Self-pay | Admitting: Pharmacy Technician

## 2011-03-28 ENCOUNTER — Encounter (HOSPITAL_BASED_OUTPATIENT_CLINIC_OR_DEPARTMENT_OTHER): Payer: Medicare Other | Admitting: Oncology

## 2011-03-28 VITALS — BP 114/76 | HR 79 | Temp 98.3°F | Wt 156.2 lb

## 2011-03-28 DIAGNOSIS — D72829 Elevated white blood cell count, unspecified: Secondary | ICD-10-CM

## 2011-03-28 DIAGNOSIS — F17200 Nicotine dependence, unspecified, uncomplicated: Secondary | ICD-10-CM

## 2011-03-28 DIAGNOSIS — K5090 Crohn's disease, unspecified, without complications: Secondary | ICD-10-CM

## 2011-03-28 MED ORDER — SODIUM CHLORIDE 0.45 % IV SOLN
Freq: Once | INTRAVENOUS | Status: AC
  Administered 2011-03-29: 09:00:00 via INTRAVENOUS
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
## 2011-03-28 NOTE — Patient Instructions (Signed)
[name]  [id number] 01-Nov-1970   [facility]  Discharge Instructions  RECOMMENDATIONS MADE BY THE CONSULTANT AND ANY TEST RESULTS WILL BE SENT TO YOUR REFERRING DOCTOR.   EXAM FINDINGS BY MD TODAY AND SIGNS AND SYMPTOMS TO REPORT TO CLINIC OR PRIMARY MD: Your blood work is stable.  Congratulations on your plan to stop smoking.  Your blood counts may improve.  MEDICATIONS PRESCRIBED: none    SPECIAL INSTRUCTIONS/FOLLOW-UP: Lab work Needed in 4 months and Return to Clinic to see PA a few days after labs drawn.   I acknowledge that I have been informed and understand all the instructions given to me and received a copy. I do not have any more questions at this time, but understand that I may call the Specialty Clinic at [facility] at [phone] during business hours should I have any further questions or need assistance in obtaining follow-up care.    __________________________________________  _____________  __________ Signature of Patient or Authorized Representative            Date                   Time    __________________________________________ Nurse's Signature

## 2011-03-29 ENCOUNTER — Ambulatory Visit (HOSPITAL_COMMUNITY)
Admission: RE | Admit: 2011-03-29 | Discharge: 2011-03-29 | Disposition: A | Discharge status: Home / Self Care | Payer: Medicare Other | Source: Ambulatory Visit | Attending: Internal Medicine | Admitting: Internal Medicine

## 2011-03-29 ENCOUNTER — Encounter (HOSPITAL_COMMUNITY)
Admission: RE | Disposition: A | Discharge status: Home / Self Care | Payer: Self-pay | Source: Ambulatory Visit | Attending: Internal Medicine

## 2011-03-29 ENCOUNTER — Encounter (HOSPITAL_COMMUNITY): Payer: Self-pay | Admitting: *Deleted

## 2011-03-29 DIAGNOSIS — K5090 Crohn's disease, unspecified, without complications: Secondary | ICD-10-CM

## 2011-03-29 DIAGNOSIS — Z9049 Acquired absence of other specified parts of digestive tract: Secondary | ICD-10-CM | POA: Insufficient documentation

## 2011-03-29 DIAGNOSIS — Z1211 Encounter for screening for malignant neoplasm of colon: Secondary | ICD-10-CM

## 2011-03-29 DIAGNOSIS — Z9189 Other specified personal risk factors, not elsewhere classified: Secondary | ICD-10-CM

## 2011-03-29 HISTORY — PX: COLONOSCOPY: SHX5424

## 2011-03-29 SURGERY — COLONOSCOPY
Anesthesia: Moderate Sedation

## 2011-03-29 MED ORDER — MIDAZOLAM HCL 5 MG/5ML IJ SOLN
INTRAMUSCULAR | Status: AC
  Filled 2011-03-29: qty 10

## 2011-03-29 MED ORDER — MIDAZOLAM HCL 5 MG/5ML IJ SOLN
INTRAMUSCULAR | Status: DC | PRN
  Administered 2011-03-29: 1 mg via INTRAVENOUS
  Administered 2011-03-29 (×2): 2 mg via INTRAVENOUS
  Administered 2011-03-29 (×2): 1 mg via INTRAVENOUS

## 2011-03-29 MED ORDER — MEPERIDINE HCL 100 MG/ML IJ SOLN
INTRAMUSCULAR | Status: DC | PRN
  Administered 2011-03-29 (×2): 25 mg via INTRAVENOUS
  Administered 2011-03-29: 50 mg via INTRAVENOUS

## 2011-03-29 MED ORDER — MEPERIDINE HCL 100 MG/ML IJ SOLN
INTRAMUSCULAR | Status: AC
  Filled 2011-03-29: qty 2

## 2011-03-29 NOTE — H&P (Signed)
  I have seen & examined the patient prior to the procedure(s) today and reviewed the history and physical/consultation.  There have been no changes.  After consideration of the risks, benefits, alternatives and imponderables, the patient has consented to the procedure(s).  Has not been on Entocort since December 2012 in contradistinction to what our recent office note states.

## 2011-03-29 NOTE — Op Note (Signed)
[facility] [address] [place], [place]  [address]  COLONOSCOPY PROCEDURE REPORT  PATIENT:  Michele, [name]  MR#:  [id number] BIRTHDATE:  Oct 23, 1970, 40 yrs. old  GENDER:  female ENDOSCOPIST:  R. Garfield Cornea, MD FACP [facility] REF. BY:  [name], M.D. PROCEDURE DATE:  03/29/2011 PROCEDURE:  screening colonoscopy INDICATIONS:  Colorectal cancer screening; history of ileocolonic Crohn's disease; doing extremely well on biologic therapy  INFORMED CONSENT:  The risks, benefits, alternatives and imponderables including but not limited to bleeding, perforation as well as the possibility of a missed lesion have been reviewed. The potential for biopsy, lesion removal, etc. have also been discussed.  Questions have been answered.  All parties agreeable. Please see the history and physical in the medical record for more information.  MEDICATIONS:  IV Versed and Demerol in incremental doses.  DESCRIPTION OF PROCEDURE:  After a digital rectal exam was performed, the EC-[id number] ([id number]) colonoscope was advanced from the anus through the rectum and colon to the area of the cecum, ileocecal valve and appendiceal orifice.  The cecum was deeply intubated.  These structures were well-seen and photographed for the record.  From the level of the cecum and ileocecal valve, the scope was slowly and cautiously withdrawn.  The mucosal surfaces were carefully surveyed utilizing scope tip deflection to facilitate fold flattening as needed.  The scope was pulled down into the rectum where a thorough examination including retroflexion was performed. <<PROCEDUREIMAGES>>  FINDINGS:  adequate preparation. Normal rectum. Status post right hemicolectomy. Cecum absent. Area around the surgical anastomosis somewhat           fibrotic couple areas of superficial ulceration with healing present. Small aperture to the terminal ileum. Unable to intubate the neo-         terminal ileum. No biopsies  taken. The remainder of the residual colonic mucosa appeared normal  THERAPEUTIC / DIAGNOSTIC MANEUVERS PERFORMED:  none  COMPLICATIONS:  none  CECAL WITHDRAWAL TIME:   not applicable  IMPRESSION:  Normal appearing residual colon and rectum status post prior right hemicolectomy. She appears to have relatively inactive                  disease at the anastomosis endoscopically. Clinically, it certainly sounds like she is gaining a  good remission on biologic therapy.  RECOMMENDATIONS:    Continue present regimen. Her improved compliance has been very helpful in dealing with her disease. Followup in the office as scheduled.  ______________________________ R. Garfield Cornea, MD [facility]  CC:  [name], M.D.  n. eSIGNED:   R. Garfield Cornea at 03/29/2011 10:45 AM  [name], [phone]

## 2011-04-06 ENCOUNTER — Encounter (HOSPITAL_COMMUNITY): Payer: Self-pay | Admitting: Internal Medicine

## 2011-05-30 ENCOUNTER — Other Ambulatory Visit: Payer: Self-pay

## 2011-05-30 HISTORY — PX: MULTIPLE TOOTH EXTRACTIONS: SHX2053

## 2011-05-30 MED ORDER — ADALIMUMAB 40 MG/0.8ML ~~LOC~~ KIT: 40.0000 mg | PACK | Freq: Once | SUBCUTANEOUS | Status: DC

## 2011-05-30 NOTE — Telephone Encounter (Signed)
Needs to be rewritten on a monthly bases for insurance.

## 2011-07-07 ENCOUNTER — Encounter: Payer: Self-pay | Admitting: Internal Medicine

## 2011-07-14 ENCOUNTER — Encounter: Payer: Self-pay | Admitting: Internal Medicine

## 2011-07-18 ENCOUNTER — Ambulatory Visit: Payer: Medicare Other | Admitting: Gastroenterology

## 2011-07-22 ENCOUNTER — Ambulatory Visit (INDEPENDENT_AMBULATORY_CARE_PROVIDER_SITE_OTHER): Payer: Medicare Other | Admitting: Family Medicine

## 2011-07-22 ENCOUNTER — Encounter: Payer: Self-pay | Admitting: Family Medicine

## 2011-07-22 VITALS — BP 120/84 | HR 85 | Resp 16 | Ht 67.0 in | Wt 177.0 lb

## 2011-07-22 DIAGNOSIS — B001 Herpesviral vesicular dermatitis: Secondary | ICD-10-CM | POA: Insufficient documentation

## 2011-07-22 DIAGNOSIS — R21 Rash and other nonspecific skin eruption: Secondary | ICD-10-CM

## 2011-07-22 DIAGNOSIS — B009 Herpesviral infection, unspecified: Secondary | ICD-10-CM

## 2011-07-22 DIAGNOSIS — L309 Dermatitis, unspecified: Secondary | ICD-10-CM

## 2011-07-22 DIAGNOSIS — L259 Unspecified contact dermatitis, unspecified cause: Secondary | ICD-10-CM

## 2011-07-22 MED ORDER — CLOTRIMAZOLE 1 % EX CREA: TOPICAL_CREAM | Freq: Two times a day (BID) | CUTANEOUS | Status: AC

## 2011-07-22 MED ORDER — ACYCLOVIR 400 MG PO TABS: 400.0000 mg | ORAL_TABLET | Freq: Three times a day (TID) | ORAL | Status: DC

## 2011-07-22 NOTE — Assessment & Plan Note (Signed)
Significant fever blisters noted on the upper and lower lip with some localized swelling. She has some drainage from the lesions at top with some crusting. She will take acyclovir 400 mg 3 times a day. She is immunocompromised been on Humira for her Crohn's disease. Bacitracin will be applied to the lesions that have the crusting.

## 2011-07-22 NOTE — Assessment & Plan Note (Signed)
Lesion on the left forearm appears to be fungal in nature. Will give trial of clotrimazole topical.

## 2011-07-22 NOTE — Assessment & Plan Note (Signed)
Recurrent lesion on the sole of left foot. This appears to be a dermatitis with thickened skin and pruritus. She will use cortisone as well as emollient.

## 2011-07-22 NOTE — Progress Notes (Signed)
  Subjective:    Patient ID: [name], female    DOB: [birth year], 41 y.o.   MRN: [id number]  HPI  Patient presents with fever blisters for the past 24 hours. She states she was out in the garden and woke up with significant blisters across her top lip and her lower lip. She's had similar outbreak last about a year ago. She is on Humira for her Crohn's disease. She denies any difficulty breathing, difficulty swallowing. She has had some allergies and has been taking Benadryl but otherwise feels well. She also knows she has a rash on her left forearm for the past couple weeks and a rash on the bottom of her left foot which she gets intermittently typically on both feet.  Review of Systems   GEN- denies fatigue, fever, weight loss,weakness, recent illness HEENT- denies eye drainage, change in vision, nasal discharge, CVS- denies chest pain, palpitations RESP- denies SOB, cough, wheeze MSK- denies joint pain, muscle aches, injury       Objective:   Physical Exam GEN- NAD, alert and oriented x3 HEENT- PERRL, EOMI, non injected sclera, pink conjunctiva, MMM, oropharynx clear, fever blisters noted with clear drainage across upper lip R>L and one lesion on Right side of lower lip, crusting of lesions over philtrum with clear drainage, mild erythema, no inner mucosal lesions Neck- Supple, small shotty LAD CVS- RRR, no murmur RESP-CTAB EXT- No edema Pulses- Radial, DP- 2+ Skin- small quarter size ring lesion with central clearing on left forearm, dry, hyperpigemented scaly rash on sole of left foot         Assessment & Plan:

## 2011-07-22 NOTE — Patient Instructions (Signed)
Use the fungal cream for your arm Use cortisone on your left foot with vaseline Take the anti-virals for the fever blister Use cream for bacteria over top lip  Return if they do not improve Fever Blisters, Herpes Simplex Herpes simplex is a virus. This virus causes fever blisters or cold sores. Fever blisters are small sores on the lips, gums, or roof of the mouth. People often get infected with this herpes virus but do not have any symptoms. The blisters may break out when a person is:  Tired.   Under stress.   Suffering from another infection (such as a cold).   Exposed to sunlight.  The blisters usually heal within 1 week. The virus can be easily passed to other people and to other parts of the body, such as the eyes and sex organs. HOME CARE INSTRUCTIONS    Only take over-the-counter or prescription medicines for pain, discomfort, or fever as directed by your caregiver. Do not use aspirin.   Do not touch the blisters or pick the scabs. Wash your hands often. Do not touch your eyes without washing your hands first.   Avoid close contact with other people, especially kissing, until blisters heal.   Hot, cold, or salty foods may hurt your mouth. Use a straw to drink. Eating a well-balanced diet will help healing.  SEEK MEDICAL CARE IF:    Your eye feels irritated, painful, or you feel like you have something in your eye.   You develop a fever, feel achy, or see pus instead of clear fluid in the sores. These are signs of a bacterial infection.   You get blisters on your genitals.   You develop new, unexplained symptoms.  MAKE SURE YOU:  Understand these instructions.   Will watch your condition.   Will get help right away if you are not doing well or get worse.  Document Released: 06/18/2010 Document Revised: 02/10/2011 Document Reviewed: 06/18/2010 [facility] Patient Information 2012 [place].

## 2011-07-22 NOTE — Assessment & Plan Note (Signed)
Acyclovir per above

## 2011-07-26 ENCOUNTER — Ambulatory Visit (INDEPENDENT_AMBULATORY_CARE_PROVIDER_SITE_OTHER): Payer: Medicare Other | Admitting: Urgent Care

## 2011-07-26 ENCOUNTER — Ambulatory Visit (HOSPITAL_COMMUNITY): Payer: Medicare Other | Admitting: Oncology

## 2011-07-26 ENCOUNTER — Encounter: Payer: Self-pay | Admitting: Urgent Care

## 2011-07-26 ENCOUNTER — Encounter (HOSPITAL_COMMUNITY): Payer: Medicare Other | Attending: Oncology

## 2011-07-26 VITALS — BP 111/73 | HR 87 | Temp 97.8°F | Ht 67.5 in | Wt 177.0 lb

## 2011-07-26 DIAGNOSIS — K219 Gastro-esophageal reflux disease without esophagitis: Secondary | ICD-10-CM

## 2011-07-26 DIAGNOSIS — J45909 Unspecified asthma, uncomplicated: Secondary | ICD-10-CM | POA: Insufficient documentation

## 2011-07-26 DIAGNOSIS — K5090 Crohn's disease, unspecified, without complications: Secondary | ICD-10-CM | POA: Insufficient documentation

## 2011-07-26 DIAGNOSIS — R599 Enlarged lymph nodes, unspecified: Secondary | ICD-10-CM

## 2011-07-26 DIAGNOSIS — J984 Other disorders of lung: Secondary | ICD-10-CM | POA: Insufficient documentation

## 2011-07-26 DIAGNOSIS — F319 Bipolar disorder, unspecified: Secondary | ICD-10-CM | POA: Insufficient documentation

## 2011-07-26 DIAGNOSIS — B009 Herpesviral infection, unspecified: Secondary | ICD-10-CM

## 2011-07-26 DIAGNOSIS — D72829 Elevated white blood cell count, unspecified: Secondary | ICD-10-CM | POA: Insufficient documentation

## 2011-07-26 DIAGNOSIS — F411 Generalized anxiety disorder: Secondary | ICD-10-CM | POA: Insufficient documentation

## 2011-07-26 DIAGNOSIS — Z8711 Personal history of peptic ulcer disease: Secondary | ICD-10-CM | POA: Insufficient documentation

## 2011-07-26 DIAGNOSIS — R590 Localized enlarged lymph nodes: Secondary | ICD-10-CM

## 2011-07-26 DIAGNOSIS — F17200 Nicotine dependence, unspecified, uncomplicated: Secondary | ICD-10-CM | POA: Insufficient documentation

## 2011-07-26 DIAGNOSIS — B001 Herpesviral vesicular dermatitis: Secondary | ICD-10-CM

## 2011-07-26 LAB — DIFFERENTIAL
Basophils Absolute: 0.1 10*3/uL (ref 0.0–0.1)
Basophils Relative: 0 % (ref 0–1)
Eosinophils Absolute: 0.6 10*3/uL (ref 0.0–0.7)
Eosinophils Relative: 4 % (ref 0–5)
Lymphocytes Relative: 44 % (ref 12–46)
Lymphs Abs: 6.1 10*3/uL — ABNORMAL HIGH (ref 0.7–4.0)
Monocytes Absolute: 0.6 10*3/uL (ref 0.1–1.0)
Monocytes Relative: 5 % (ref 3–12)
Neutro Abs: 6.5 10*3/uL (ref 1.7–7.7)
Neutrophils Relative %: 47 % (ref 43–77)

## 2011-07-26 LAB — CBC
HCT: 39.5 % (ref 36.0–46.0)
Hemoglobin: 13.2 g/dL (ref 12.0–15.0)
MCHC: 33.4 g/dL (ref 30.0–36.0)
RBC: 4.71 MIL/uL (ref 3.87–5.11)
WBC: 13.8 10*3/uL — ABNORMAL HIGH (ref 4.0–10.5)

## 2011-07-26 NOTE — Patient Instructions (Signed)
Continue current meds Call for any problems Office visit in 3 months

## 2011-07-26 NOTE — Progress Notes (Signed)
Labs drawn today for cbc
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
## 2011-07-27 ENCOUNTER — Encounter: Payer: Self-pay | Admitting: Urgent Care

## 2011-07-27 DIAGNOSIS — R590 Localized enlarged lymph nodes: Secondary | ICD-10-CM | POA: Insufficient documentation

## 2011-07-27 NOTE — Assessment & Plan Note (Addendum)
Suspected oral HSV.  On acyclovir.  Will continue to monitor.

## 2011-07-27 NOTE — Assessment & Plan Note (Signed)
Well controlled on omeprazole 46m BID

## 2011-07-27 NOTE — Assessment & Plan Note (Signed)
Followed by Dr Tressie Stalker.  Will need FU lymphadenopathy to ensure resolution after presumed HSV-1 flare.

## 2011-07-27 NOTE — Assessment & Plan Note (Addendum)
In remission on Humira.  Doing well other than oral HSV on acyclovir.    Labs UTD. OV 3 mo or sooner if needed

## 2011-07-27 NOTE — Assessment & Plan Note (Signed)
Enlarged right anterior cervical node.  ? R/T current oral HSV.  Will need FU to ensure resolution w/ PCP.  HX leukocytosis.

## 2011-07-28 ENCOUNTER — Encounter (HOSPITAL_BASED_OUTPATIENT_CLINIC_OR_DEPARTMENT_OTHER): Payer: Medicare Other | Admitting: Oncology

## 2011-07-28 VITALS — BP 110/75 | HR 87 | Temp 98.3°F | Wt 178.2 lb

## 2011-07-28 DIAGNOSIS — D72829 Elevated white blood cell count, unspecified: Secondary | ICD-10-CM

## 2011-07-28 DIAGNOSIS — B009 Herpesviral infection, unspecified: Secondary | ICD-10-CM

## 2011-07-28 DIAGNOSIS — R590 Localized enlarged lymph nodes: Secondary | ICD-10-CM

## 2011-07-28 DIAGNOSIS — R599 Enlarged lymph nodes, unspecified: Secondary | ICD-10-CM

## 2011-07-28 NOTE — Progress Notes (Signed)
Faxed to PCP
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
## 2011-07-28 NOTE — Patient Instructions (Signed)
[facility]  Discharge Instructions  RECOMMENDATIONS MADE BY THE CONSULTANT AND ANY TEST RESULTS WILL BE SENT TO YOUR REFERRING DOCTOR.   EXAM FINDINGS BY MD TODAY AND SIGNS AND SYMPTOMS TO REPORT TO CLINIC OR PRIMARY MD:   Please return in 2 months for labs and doctor appt.    I acknowledge that I have been informed and understand all the instructions given to me and received a copy. I do not have any more questions at this time, but understand that I may call the Specialty [facility] at [facility] at [phone] during business hours should I have any further questions or need assistance in obtaining follow-up care.    __________________________________________  _____________  __________ Signature of Patient or Authorized Representative            Date                   Time    __________________________________________ Nurse's Signature

## 2011-08-02 ENCOUNTER — Other Ambulatory Visit: Payer: Self-pay | Admitting: Family Medicine

## 2011-08-02 DIAGNOSIS — Z139 Encounter for screening, unspecified: Secondary | ICD-10-CM

## 2011-08-04 ENCOUNTER — Encounter: Payer: Medicare Other | Admitting: Family Medicine

## 2011-08-18 ENCOUNTER — Ambulatory Visit (HOSPITAL_COMMUNITY): Payer: Medicare Other

## 2011-08-18 ENCOUNTER — Other Ambulatory Visit: Payer: Self-pay | Admitting: Family Medicine

## 2011-08-18 ENCOUNTER — Ambulatory Visit (HOSPITAL_COMMUNITY)
Admission: RE | Admit: 2011-08-18 | Discharge: 2011-08-18 | Disposition: A | Discharge status: Home / Self Care | Payer: Medicare Other | Source: Ambulatory Visit | Attending: Family Medicine | Admitting: Family Medicine

## 2011-08-18 DIAGNOSIS — Z1231 Encounter for screening mammogram for malignant neoplasm of breast: Secondary | ICD-10-CM | POA: Insufficient documentation

## 2011-08-18 DIAGNOSIS — Z139 Encounter for screening, unspecified: Secondary | ICD-10-CM

## 2011-09-26 ENCOUNTER — Ambulatory Visit: Payer: Medicare Other | Admitting: Gastroenterology

## 2011-09-29 ENCOUNTER — Encounter (HOSPITAL_COMMUNITY): Payer: Medicare Other | Attending: Oncology | Admitting: Oncology

## 2011-09-29 ENCOUNTER — Encounter (HOSPITAL_BASED_OUTPATIENT_CLINIC_OR_DEPARTMENT_OTHER): Payer: Medicare Other

## 2011-09-29 VITALS — BP 121/81 | HR 94 | Temp 97.2°F | Wt 182.4 lb

## 2011-09-29 DIAGNOSIS — R590 Localized enlarged lymph nodes: Secondary | ICD-10-CM

## 2011-09-29 DIAGNOSIS — F17200 Nicotine dependence, unspecified, uncomplicated: Secondary | ICD-10-CM

## 2011-09-29 DIAGNOSIS — B009 Herpesviral infection, unspecified: Secondary | ICD-10-CM

## 2011-09-29 DIAGNOSIS — D72829 Elevated white blood cell count, unspecified: Secondary | ICD-10-CM

## 2011-09-29 DIAGNOSIS — K5090 Crohn's disease, unspecified, without complications: Secondary | ICD-10-CM

## 2011-09-29 DIAGNOSIS — J45909 Unspecified asthma, uncomplicated: Secondary | ICD-10-CM

## 2011-09-29 DIAGNOSIS — R599 Enlarged lymph nodes, unspecified: Secondary | ICD-10-CM | POA: Insufficient documentation

## 2011-09-29 LAB — DIFFERENTIAL
Eosinophils Absolute: 0.5 10*3/uL (ref 0.0–0.7)
Eosinophils Relative: 3 % (ref 0–5)
Lymphocytes Relative: 37 % (ref 12–46)
Lymphs Abs: 5.5 10*3/uL — ABNORMAL HIGH (ref 0.7–4.0)
Monocytes Relative: 4 % (ref 3–12)

## 2011-09-29 LAB — CBC
Hemoglobin: 13.2 g/dL (ref 12.0–15.0)
MCH: 28.4 pg (ref 26.0–34.0)
MCV: 84.5 fL (ref 78.0–100.0)
Platelets: 330 10*3/uL (ref 150–400)
RBC: 4.65 MIL/uL (ref 3.87–5.11)
WBC: 15 10*3/uL — ABNORMAL HIGH (ref 4.0–10.5)
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
## 2011-09-29 NOTE — Patient Instructions (Signed)
[name]  Discharge Instructions [name]  [id number] [birth year] Dr. Everardo All    RECOMMENDATIONS MADE BY THE CONSULTANT AND ANY TEST RESULTS WILL BE SENT TO YOUR REFERRING DOCTOR.   EXAM FINDINGS BY MD TODAY AND SIGNS AND SYMPTOMS TO REPORT TO CLINIC OR PRIMARY MD:   Return in 4 months for labs then to see [name]  I acknowledge that I have been informed and understand all the instructions given to me and received a copy. I do not have any more questions at this time, but understand that I may call the Specialty Clinic at [facility] at [phone] during business hours should I have any further questions or need assistance in obtaining follow-up care.    __________________________________________  _____________  __________ Signature of Patient or Authorized Representative            Date                   Time    __________________________________________ Nurse's Signature

## 2011-09-29 NOTE — Progress Notes (Signed)
Labs drawn today for cbc,diff

## 2011-10-03 ENCOUNTER — Encounter: Payer: Self-pay | Admitting: Gastroenterology

## 2011-10-03 ENCOUNTER — Ambulatory Visit (INDEPENDENT_AMBULATORY_CARE_PROVIDER_SITE_OTHER): Payer: Medicare Other | Admitting: Gastroenterology

## 2011-10-03 VITALS — BP 125/77 | HR 96 | Temp 97.4°F | Ht 67.0 in | Wt 182.6 lb

## 2011-10-03 DIAGNOSIS — K5090 Crohn's disease, unspecified, without complications: Secondary | ICD-10-CM

## 2011-10-03 NOTE — Assessment & Plan Note (Signed)
Recent 48 hour history of diarrhea which occurred about one month ago. Doubt significant Crohn's flare. She may have had a viral illness. At any rate she is doing very well, back at baseline and appears to be in remission. Recent CBC reviewed.  Encouraged continued smoking cessation attempts.  Office visit in 3 months with Dr. Gala Romney.

## 2011-10-03 NOTE — Progress Notes (Signed)
Primary Care Physician: [name], MD  Primary Gastroenterologist:  Garfield Cornea, MD   Chief Complaint  Patient presents with  . Diarrhea    HPI: [name] is a 41 y.o. female here for diarrhea. H/O Crohn's disease which has been in remission on Humira for quite some time. Last seen in 07/2011. Also with chronic GERD, controlled on omeprazole 29m bid.  Patient made appointment today because recently she had episode of fecal incontinence associated with diarrhea. She is doing her well in about 4 weeks ago she had acute onset uncontrollable diarrhea which lasted for about 2 days. She had couple episodes of fecal incontinence. Denies blood in the stool or abdominal pain. No fever or chills. She states she been under a lot of stress, father-in-law recently diagnosed with "blood cancer". He seems to be doing better now. Her daughter is due to have her first child next month. [place] very excited about this. She has been trying to stop smoking for some time now and recently had a relapse with all the stress. She is back down to half pack a day. Her weight is up about 25 pounds since January. Her weight is up 4 pounds since May.  At this point she states she is back at her baseline which consist of 3-4 stools at most daily. Stools are formed/soft. No blood. She reports 100% compliance on Humira. Denies recent antibiotics.   Current Outpatient Prescriptions  Medication Sig Dispense Refill  . adalimumab (HUMIRA) 40 MG/0.8ML injection Inject 0.8 mLs (40 mg total) into the skin once. GIVE 40 MG (0.8ML) [place] EVERY 14 DAYS  2 vial  5  . calcium-vitamin D (OSCAL) 250-125 MG-UNIT per tablet Take 1 tablet by mouth daily.        . clotrimazole (LOTRIMIN) 1 % cream Apply topically 2 (two) times daily. For arm  30 g  0  . Cyanocobalamin (B-12) 1000 MCG CAPS Take 1 capsule by mouth daily.        . diphenhydrAMINE (BENADRYL) 25 mg capsule Take 25 mg by mouth daily as needed. For allergies       . fish  oil-omega-3 fatty acids 1000 MG capsule Take 3 g by mouth daily.       .Marland KitchenKLOR-CON M20 20 MEQ tablet TAKE ONE TABLET BY MOUTH TWICE A DAY  60 tablet  3  . Liniments (SALONPAS ARTHRITIS PAIN RELIEF) PADS Apply 1 each topically every 8 (eight) hours as needed. pain      . Multiple Vitamins-Minerals (CENTRUM) tablet Take 1 tablet by mouth daily.  30 tablet  0  . omeprazole (PRILOSEC) 20 MG capsule Take 1 capsule (20 mg total) by mouth 2 (two) times daily before a meal.  62 capsule  11    Allergies as of 10/03/2011 - Review Complete 10/03/2011  Allergen Reaction Noted  . Oxycodone-acetaminophen Itching 12/29/2005  . Oxycodone-acetaminophen Itching 10/17/2010  . Peanut-containing drug products  03/22/2011  . Shellfish allergy  03/22/2011  . Tramadol hcl Itching     ROS:  General: Negative for anorexia, weight loss, fever, chills, fatigue, weakness. ENT: Negative for hoarseness, difficulty swallowing , nasal congestion. CV: Negative for chest pain, angina, palpitations, dyspnea on exertion, peripheral edema.  Respiratory: Negative for dyspnea at rest, dyspnea on exertion, cough, sputum, wheezing.  GI: See history of present illness. GU:  Negative for dysuria, hematuria, urinary incontinence, urinary frequency, nocturnal urination.  Endo: Negative for unusual weight change.    Physical Examination:   BP 125/77  Pulse 96  Temp 97.4 F (36.3 C) (Temporal)  Ht 5' 7"  (1.702 m)  Wt 182 lb 9.6 oz (82.827 kg)  BMI 28.60 kg/m2  LMP 09/27/2011  General: Well-nourished, well-developed in no acute distress.  Eyes: No icterus. Mouth: Oropharyngeal mucosa moist and pink , no lesions erythema or exudate. Lungs: Clear to auscultation bilaterally.  Heart: Regular rate and rhythm, no murmurs rubs or gallops.  Abdomen: Bowel sounds are normal, nontender, nondistended, no hepatosplenomegaly or masses, no abdominal bruits or hernia , no rebound or guarding.   Extremities: No lower extremity edema. No  clubbing or deformities. Neuro: Alert and oriented x 4   Skin: Warm and dry, no jaundice.   Psych: Alert and cooperative, normal mood and affect.  Labs:  CBC    Component Value Date/Time   WBC 15.0* 09/29/2011 1020   RBC 4.65 09/29/2011 1020   HGB 13.2 09/29/2011 1020   HCT 39.3 09/29/2011 1020   PLT 330 09/29/2011 1020   MCV 84.5 09/29/2011 1020   MCH 28.4 09/29/2011 1020   MCHC 33.6 09/29/2011 1020   RDW 14.6 09/29/2011 1020   LYMPHSABS 5.5* 09/29/2011 1020   MONOABS 0.6 09/29/2011 1020   EOSABS 0.5 09/29/2011 1020   BASOSABS 0.0 09/29/2011 1020     Imaging Studies: No results found.

## 2011-10-03 NOTE — Patient Instructions (Signed)
Continue attempts for smoking cessation as discussed. Continue Humira as before. Office visit in November with Dr. Gala Romney.

## 2011-10-03 NOTE — Progress Notes (Signed)
Faxed to PCP

## 2011-10-19 ENCOUNTER — Other Ambulatory Visit: Payer: Self-pay

## 2011-10-20 MED ORDER — ADALIMUMAB 40 MG/0.8ML ~~LOC~~ KIT: 40.0000 mg | PACK | Freq: Once | SUBCUTANEOUS | Status: DC

## 2011-10-25 ENCOUNTER — Other Ambulatory Visit: Payer: Self-pay

## 2011-10-26 ENCOUNTER — Encounter: Payer: Medicare Other | Admitting: Family Medicine

## 2011-10-26 MED ORDER — OMEPRAZOLE 20 MG PO CPDR: 20.0000 mg | DELAYED_RELEASE_CAPSULE | Freq: Two times a day (BID) | ORAL | Status: DC

## 2011-11-14 ENCOUNTER — Ambulatory Visit (INDEPENDENT_AMBULATORY_CARE_PROVIDER_SITE_OTHER): Payer: Medicare Other | Admitting: Family Medicine

## 2011-11-14 ENCOUNTER — Other Ambulatory Visit (HOSPITAL_COMMUNITY)
Admission: RE | Admit: 2011-11-14 | Discharge: 2011-11-14 | Disposition: A | Discharge status: Home / Self Care | Payer: Medicare Other | Source: Ambulatory Visit | Attending: Family Medicine | Admitting: Family Medicine

## 2011-11-14 ENCOUNTER — Encounter: Payer: Self-pay | Admitting: Family Medicine

## 2011-11-14 VITALS — BP 120/82 | HR 94 | Resp 16 | Ht 67.0 in | Wt 184.8 lb

## 2011-11-14 DIAGNOSIS — Z87891 Personal history of nicotine dependence: Secondary | ICD-10-CM | POA: Insufficient documentation

## 2011-11-14 DIAGNOSIS — Z124 Encounter for screening for malignant neoplasm of cervix: Secondary | ICD-10-CM

## 2011-11-14 DIAGNOSIS — Z1211 Encounter for screening for malignant neoplasm of colon: Secondary | ICD-10-CM

## 2011-11-14 DIAGNOSIS — R5381 Other malaise: Secondary | ICD-10-CM

## 2011-11-14 DIAGNOSIS — Z0000 Encounter for general adult medical examination without abnormal findings: Secondary | ICD-10-CM | POA: Insufficient documentation

## 2011-11-14 DIAGNOSIS — F17200 Nicotine dependence, unspecified, uncomplicated: Secondary | ICD-10-CM

## 2011-11-14 DIAGNOSIS — Z01419 Encounter for gynecological examination (general) (routine) without abnormal findings: Secondary | ICD-10-CM | POA: Insufficient documentation

## 2011-11-14 DIAGNOSIS — R5383 Other fatigue: Secondary | ICD-10-CM

## 2011-11-14 DIAGNOSIS — Z23 Encounter for immunization: Secondary | ICD-10-CM

## 2011-11-14 LAB — POC HEMOCCULT BLD/STL (OFFICE/1-CARD/DIAGNOSTIC): Fecal Occult Blood, POC: NEGATIVE

## 2011-11-14 MED ORDER — BUPROPION HCL ER (SR) 150 MG PO TB12: 150.0000 mg | ORAL_TABLET | Freq: Two times a day (BID) | ORAL | Status: DC

## 2011-11-14 NOTE — Patient Instructions (Addendum)
F/u in January.Call if you need me before  Flu vaccine today  You need to set a quit date 2 weeks after starting welbutrin/zyban for smoking cessation. The first 3 days, take one zyban on;y, after 3 days start one twice daily. Congrats on deciding to quit, you need to , so that you improve your health, and reduce your risk of cancer, heart disease, lung failure and stroke You will get information on t[ps and quit line # to help with smoking cessation   Please think about quitting smoking.  This is very important for your health.  Consider setting a quit date, then cutting back or switching brands to prepare to stop.  Also think of the money you will save every day by not smoking.  Quick Tips to Quit Smoking: Fix a date i.e. keep a date in mind from when you would not touch a tobacco product to smoke  Keep yourself busy and block your mind with work loads or reading books or watching movies in malls where smoking is not allowed  Vanish off the things which reminds you about smoking for example match box, or your favorite lighter, or the pipe you used for smoking, or your favorite jeans and shirt with which you used to enjoy smoking, or the club where you used to do smoking  Try to avoid certain people places and incidences where and with whom smoking is a common factor to add on  Praise yourself with some token gifts from the money you saved by stopping smoking  Anti Smoking teams are there to help you. Join their programs  Anti-smoking Gums are there in many medical shops. Try them to quit smoking   Side-effects of Smoking: Disease caused by smoking cigarettes are emphysema, bronchitis, heart failures  Premature death  Cancer is the major side effect of smoking  Heart attacks and strokes are the quick effects of smoking causing sudden death  Some smokers lives end up with limbs amputated  Breathing problem or fast breathing is another side effect of smoking  Due to more intakes of smokes,  carbon mono-oxide goes into your brain and other muscles of the body which leads to swelling of the veins and blockage to the air passage to lungs  Carbon monoxide blocks blood vessels which leads to blockage in the flow of blood to different major body organs like heart lungs and thus leads to attacks and deaths  During pregnancy smoking is very harmful and leads to premature birth of the infant, spontaneous abortions, low weight of the infant during birth  Fat depositions to narrow and blocked blood vessels causing heart attacks  In many cases cigarette smoking caused infertility in men    Flu vaccine today  Fasting cmp and TSH in January, before visit

## 2011-11-14 NOTE — Assessment & Plan Note (Signed)

## 2011-11-20 NOTE — Assessment & Plan Note (Signed)
Annual exam completed and documented. Counseled pt re the need to commit to healthy diet , low in fats, and sugars, ricch in vegetable and fruit, and also to commit to regular physical activity on a daily basis. Patient counseled for approximately 5 minutes regarding the health risks of ongoing nicotine use, specifically all types of cancer, heart disease, stroke and respiratory failure. The options available for help with cessation ,the behavioral changes to assist the process, and the option to either gradully reduce usage  Or abruptly stop.is also discussed. Pt is also encouraged to set specific goals in number of cigarettes used daily, as well as to set a quit date.

## 2011-11-20 NOTE — Progress Notes (Signed)
  Subjective:    Patient ID: [name], female    DOB: October 04, 1970, 41 y.o.   MRN: [id number]  HPI The PT is here for pelvic and breast exam, her annual exam. There are no specific complaints . States she wants to quit smoking , but is unwilling to set a quit date, counseled .      Review of Systems See HPI Denies recent fever or chills. Denies sinus pressure, nasal congestion, ear pain or sore throat. Denies chest congestion, productive cough or wheezing. Denies chest pains, palpitations and leg swelling Denies abdominal pain, nausea, vomiting,diarrhea or constipation.   Denies dysuria, frequency, hesitancy or incontinence. Denies joint pain, swelling and limitation in mobility. Denies headaches, seizures, numbness, or tingling. Denies depression, anxiety or insomnia. Denies skin break down or rash.        Objective:   Physical Exam Pleasant well nourished female, alert and oriented x 3, in no cardio-pulmonary distress. Afebrile. HEENT No facial trauma or asymetry. Sinuses non tender.  EOMI, PERTL, fundoscopic exam  no hemorhage or exudate.  External ears normal, tympanic membranes clear. Oropharynx moist, no exudate,poor dentition. Neck: supple, no adenopathy,JVD or thyromegaly.No bruits.  Chest: Clear to ascultation bilaterally.No crackles or wheezes.Decreased air entry throughoutNon tender to palpation  Breast: No asymetry,no masses. No nipple discharge or inversion. No axillary or supraclavicular adenopathy  Cardiovascular system; Heart sounds normal,  S1 and  S2 ,no S3.  No murmur, or thrill. Apical beat not displaced Peripheral pulses normal.  Abdomen: Soft, non tender, no organomegaly or masses. No bruits. Bowel sounds normal. No guarding, tenderness or rebound.  Rectal:  No mass. Guaiac negative stool.  GU: External genitalia normal. No lesions. Vaginal canal normal.No discharge. Uterus normal size, no adnexal masses, no cervical motion or  adnexal tenderness.  Musculoskeletal exam: Full ROM of spine, hips , shoulders and knees. No deformity ,swelling or crepitus noted. No muscle wasting or atrophy.   Neurologic: Cranial nerves 2 to 12 intact. Power, tone ,sensation and reflexes normal throughout. No disturbance in gait. No tremor.  Skin: Intact, no ulceration, erythema , scaling or rash noted. Pigmentation normal throughout  Psych; Normal mood and affect. Judgement and concentration normal        Assessment & Plan:

## 2012-01-03 ENCOUNTER — Encounter: Payer: Self-pay | Admitting: Internal Medicine

## 2012-01-03 ENCOUNTER — Ambulatory Visit (INDEPENDENT_AMBULATORY_CARE_PROVIDER_SITE_OTHER): Payer: Medicare Other | Admitting: Internal Medicine

## 2012-01-03 VITALS — BP 125/75 | HR 85 | Temp 97.6°F | Ht 67.0 in | Wt 187.0 lb

## 2012-01-03 DIAGNOSIS — K219 Gastro-esophageal reflux disease without esophagitis: Secondary | ICD-10-CM

## 2012-01-03 DIAGNOSIS — K5090 Crohn's disease, unspecified, without complications: Secondary | ICD-10-CM
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
## 2012-01-03 NOTE — Patient Instructions (Signed)
Bone density study  Further recommendations to follow

## 2012-01-04 ENCOUNTER — Other Ambulatory Visit: Payer: Self-pay | Admitting: Internal Medicine

## 2012-01-04 DIAGNOSIS — Z13820 Encounter for screening for osteoporosis: Secondary | ICD-10-CM

## 2012-01-09 ENCOUNTER — Ambulatory Visit (HOSPITAL_COMMUNITY)
Admission: RE | Admit: 2012-01-09 | Discharge: 2012-01-09 | Disposition: A | Discharge status: Home / Self Care | Payer: Medicare Other | Source: Ambulatory Visit | Attending: Internal Medicine | Admitting: Internal Medicine

## 2012-01-09 DIAGNOSIS — E559 Vitamin D deficiency, unspecified: Secondary | ICD-10-CM | POA: Insufficient documentation

## 2012-01-09 DIAGNOSIS — F17200 Nicotine dependence, unspecified, uncomplicated: Secondary | ICD-10-CM | POA: Insufficient documentation

## 2012-01-09 DIAGNOSIS — Z13820 Encounter for screening for osteoporosis: Secondary | ICD-10-CM | POA: Insufficient documentation

## 2012-01-19 ENCOUNTER — Other Ambulatory Visit: Payer: Self-pay | Admitting: Family Medicine

## 2012-01-24 ENCOUNTER — Other Ambulatory Visit: Payer: Self-pay

## 2012-01-24 ENCOUNTER — Encounter (HOSPITAL_COMMUNITY): Payer: Medicare Other | Attending: Oncology

## 2012-01-24 ENCOUNTER — Telehealth: Payer: Self-pay | Admitting: Family Medicine

## 2012-01-24 DIAGNOSIS — D72829 Elevated white blood cell count, unspecified: Secondary | ICD-10-CM | POA: Insufficient documentation

## 2012-01-24 DIAGNOSIS — K5090 Crohn's disease, unspecified, without complications: Secondary | ICD-10-CM | POA: Insufficient documentation

## 2012-01-24 DIAGNOSIS — J45909 Unspecified asthma, uncomplicated: Secondary | ICD-10-CM | POA: Insufficient documentation

## 2012-01-24 LAB — COMPREHENSIVE METABOLIC PANEL
Alkaline Phosphatase: 64 U/L (ref 39–117)
BUN: 10 mg/dL (ref 6–23)
Chloride: 103 mEq/L (ref 96–112)
Creatinine, Ser: 0.8 mg/dL (ref 0.50–1.10)
GFR calc Af Amer: >90 mL/min (ref >90)
Glucose, Bld: 91 mg/dL (ref 70–99)
Potassium: 3.9 mEq/L (ref 3.5–5.1)
Total Bilirubin: 0.1 mg/dL — ABNORMAL LOW (ref 0.3–1.2)

## 2012-01-24 LAB — DIFFERENTIAL
Basophils Absolute: 0.1 10*3/uL (ref 0.0–0.1)
Basophils Relative: 0 % (ref 0–1)
Eosinophils Absolute: 1 10*3/uL — ABNORMAL HIGH (ref 0.0–0.7)
Neutro Abs: 7.8 10*3/uL — ABNORMAL HIGH (ref 1.7–7.7)
Neutrophils Relative %: 48 % (ref 43–77)

## 2012-01-24 LAB — CBC
HCT: 38 % (ref 36.0–46.0)
Hemoglobin: 12.9 g/dL (ref 12.0–15.0)
MCHC: 33.9 g/dL (ref 30.0–36.0)
MCV: 82.6 fL (ref 78.0–100.0)
RDW: 14.6 % (ref 11.5–15.5)
WBC: 16.3 10*3/uL — ABNORMAL HIGH (ref 4.0–10.5)

## 2012-01-24 MED ORDER — POTASSIUM CHLORIDE CRYS ER 20 MEQ PO TBCR: 20.0000 meq | EXTENDED_RELEASE_TABLET | Freq: Two times a day (BID) | ORAL | Status: DC

## 2012-01-24 NOTE — Telephone Encounter (Signed)
Med refilled.

## 2012-01-24 NOTE — Progress Notes (Signed)
Labs drawn today for cbc/diff,cmp

## 2012-01-26 ENCOUNTER — Encounter (HOSPITAL_COMMUNITY): Payer: Self-pay | Admitting: Oncology

## 2012-01-26 ENCOUNTER — Encounter (HOSPITAL_BASED_OUTPATIENT_CLINIC_OR_DEPARTMENT_OTHER): Payer: Medicare Other | Admitting: Oncology

## 2012-01-26 VITALS — BP 131/89 | HR 73 | Temp 97.8°F | Resp 20 | Wt 191.7 lb

## 2012-01-26 DIAGNOSIS — D72829 Elevated white blood cell count, unspecified: Secondary | ICD-10-CM

## 2012-01-26 DIAGNOSIS — J309 Allergic rhinitis, unspecified: Secondary | ICD-10-CM

## 2012-01-26 DIAGNOSIS — Z87891 Personal history of nicotine dependence: Secondary | ICD-10-CM

## 2012-01-26 DIAGNOSIS — K5090 Crohn's disease, unspecified, without complications: Secondary | ICD-10-CM

## 2012-01-26 DIAGNOSIS — J45909 Unspecified asthma, uncomplicated: Secondary | ICD-10-CM

## 2012-01-26 NOTE — Patient Instructions (Addendum)
[facility]  Discharge Instructions  RECOMMENDATIONS MADE BY THE CONSULTANT AND ANY TEST RESULTS WILL BE SENT TO YOUR REFERRING DOCTOR.   You are doing well.  It will be interesting to see if your blood counts change now that you have quit smoking. Return to clinic in 6 months for labs and then to see [name]. Report any issues/concerns to clinic as needed.   I acknowledge that I have been informed and understand all the instructions given to me and received a copy. I do not have any more questions at this time, but understand that I may call the [facility] at [facility] at [phone] during business hours should I have any further questions or need assistance in obtaining follow-up care.    __________________________________________  _____________  __________ Signature of Patient or Authorized Representative            Date                   Time    __________________________________________ Nurse's Signature
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
## 2012-01-30 ENCOUNTER — Encounter: Payer: Self-pay | Admitting: Family Medicine

## 2012-01-30 ENCOUNTER — Ambulatory Visit (INDEPENDENT_AMBULATORY_CARE_PROVIDER_SITE_OTHER): Payer: Medicare Other | Admitting: Family Medicine

## 2012-01-30 VITALS — BP 110/80 | HR 96 | Resp 16 | Ht 67.0 in | Wt 190.8 lb

## 2012-01-30 DIAGNOSIS — F17200 Nicotine dependence, unspecified, uncomplicated: Secondary | ICD-10-CM

## 2012-01-30 DIAGNOSIS — Z720 Tobacco use: Secondary | ICD-10-CM

## 2012-01-30 DIAGNOSIS — E663 Overweight: Secondary | ICD-10-CM

## 2012-01-30 NOTE — Progress Notes (Signed)
  Subjective:    Patient ID: [name], female    DOB: 07/27/1970, 41 y.o.   MRN: [id number]  HPI  Patient here to have wellness form completed from her last physical exam. She has no specific concerns. Her last set of labs were reviewed with her.  Review of Systems - per above     Objective:   Physical Exam   GEN-NAD,alert and oriented x 3      Assessment & Plan:    Form completion for insurance approx  10 minutes spent discussing labs and form completion, discussed importance of smoking cessation, she is on wellbutrin and doing well.  BMI, BP  and FLP all within exceptible range for insurance See scanned document

## 2012-01-30 NOTE — Patient Instructions (Signed)
Keep previous f/u appt with Dr.Simpson Keep working on the smoking

## 2012-02-07 ENCOUNTER — Other Ambulatory Visit: Payer: Self-pay

## 2012-02-08 MED ORDER — ADALIMUMAB 40 MG/0.8ML ~~LOC~~ KIT: 40.0000 mg | PACK | SUBCUTANEOUS | Status: DC

## 2012-02-14 ENCOUNTER — Telehealth: Payer: Self-pay | Admitting: Internal Medicine

## 2012-02-14 NOTE — Telephone Encounter (Signed)
rx was sent to [facility]. It needs to be sent to [facility] please.

## 2012-02-14 NOTE — Telephone Encounter (Signed)
Pt called to say that her Humira prescription needs to be seen to [facility] in [place]. She gave me 2 different numbers [phone] or [phone]

## 2012-02-16 MED ORDER — ADALIMUMAB 40 MG/0.8ML ~~LOC~~ KIT: 40.0000 mg | PACK | SUBCUTANEOUS | Status: DC

## 2012-02-16 NOTE — Telephone Encounter (Signed)
Done

## 2012-02-16 NOTE — Addendum Note (Signed)
Addended by: Orvil Feil on: 02/16/2012 02:33 PM   Modules accepted: Orders

## 2012-03-30 ENCOUNTER — Other Ambulatory Visit: Payer: Self-pay | Admitting: Family Medicine

## 2012-03-31 LAB — COMPREHENSIVE METABOLIC PANEL
ALT: 14 U/L (ref 0–35)
AST: 17 U/L (ref 0–37)
Alkaline Phosphatase: 55 U/L (ref 39–117)
Potassium: 3.7 mEq/L (ref 3.5–5.3)
Sodium: 137 mEq/L (ref 135–145)
Total Bilirubin: 0.4 mg/dL (ref 0.3–1.2)
Total Protein: 6.7 g/dL (ref 6.0–8.3)

## 2012-04-02 LAB — TSH: TSH: 0.677 u[IU]/mL (ref 0.350–4.500)

## 2012-04-03 ENCOUNTER — Ambulatory Visit (INDEPENDENT_AMBULATORY_CARE_PROVIDER_SITE_OTHER): Payer: Medicare Other | Admitting: Family Medicine

## 2012-04-03 ENCOUNTER — Encounter: Payer: Self-pay | Admitting: Family Medicine

## 2012-04-03 VITALS — BP 102/68 | HR 90 | Resp 18 | Ht 67.0 in | Wt 195.0 lb

## 2012-04-03 DIAGNOSIS — K219 Gastro-esophageal reflux disease without esophagitis: Secondary | ICD-10-CM

## 2012-04-03 DIAGNOSIS — R7309 Other abnormal glucose: Secondary | ICD-10-CM

## 2012-04-03 DIAGNOSIS — F17200 Nicotine dependence, unspecified, uncomplicated: Secondary | ICD-10-CM

## 2012-04-03 DIAGNOSIS — K5090 Crohn's disease, unspecified, without complications: Secondary | ICD-10-CM

## 2012-04-03 DIAGNOSIS — E669 Obesity, unspecified: Secondary | ICD-10-CM

## 2012-04-03 DIAGNOSIS — F319 Bipolar disorder, unspecified: Secondary | ICD-10-CM

## 2012-04-03 DIAGNOSIS — J309 Allergic rhinitis, unspecified: Secondary | ICD-10-CM

## 2012-04-03 NOTE — Assessment & Plan Note (Addendum)
Deteriorated, not suicidal or homicidal, needs to re estblish with mental health and is willin/wanting to do this willl refer

## 2012-04-03 NOTE — Patient Instructions (Addendum)
F/U in 4 month  You are referred to mental health, if you feel suicidal please go directly to the hospital.  Please work on weight loss, portion control, and stop drinking sugary drinks  HBA1C , in 4 month  Please commit to quitting cigarettes  You will get info on class to help with eating , also a 1500 cal diet sheet  It is important that you exercise regularly at least 30 minutes 5 times a week. If you develop chest pain, have severe difficulty breathing, or feel very tired, stop exercising immediately and seek medical attention

## 2012-04-04 DIAGNOSIS — E669 Obesity, unspecified: Secondary | ICD-10-CM | POA: Insufficient documentation

## 2012-04-04 NOTE — Assessment & Plan Note (Addendum)
Controlled, no change in medication Also followed by [facility] for IBD

## 2012-04-04 NOTE — Assessment & Plan Note (Signed)
Currently controlled on meds with no recent flare

## 2012-04-04 NOTE — Assessment & Plan Note (Signed)
Deteriorated. Patient re-educated about  the importance of commitment to a  minimum of 150 minutes of exercise per week. The importance of healthy food choices with portion control discussed. Encouraged to start a food diary, count calories and to consider  joining a support group. Sample diet sheets offered. Goals set by the patient for the next several months.    

## 2012-04-04 NOTE — Assessment & Plan Note (Signed)
Unchanged. Patient counseled for approximately 5 minutes regarding the health risks of ongoing nicotine use, specifically all types of cancer, heart disease, stroke and respiratory failure. The options available for help with cessation ,the behavioral changes to assist the process, and the option to either gradully reduce usage  Or abruptly stop.is also discussed. Pt is also encouraged to set specific goals in number of cigarettes used daily, as well as to set a quit date.  

## 2012-04-04 NOTE — Assessment & Plan Note (Signed)
Stable on no meds

## 2012-04-04 NOTE — Progress Notes (Signed)
  Subjective:    Patient ID: [name], female    DOB: Sep 16, 1970, 42 y.o.   MRN: [id number]  HPI The PT is here for follow up and re-evaluation of chronic medical conditions, medication management and review of any available recent lab and radiology data.  Preventive health is updated, specifically  Cancer screening and Immunization.   Questions or concerns regarding consultations or procedures which the PT has had in the interim are  addressed. The PT denies any adverse reactions to current medications since the last visit.  C/o uncontrolled mental health issues, not suicidal or homicidal , but feels the need to re establish care with mental health services, had suicidal ideation last year and solicited the help of her spiritual counselor to get through this Smoking unchanged at this time, no plans to quit currently She is aware of significant weight gain, as not been exercising, and recognizes the need to change both    Review of Systems See HPI Denies recent fever or chills. Denies sinus pressure, nasal congestion, ear pain or sore throat. Denies chest congestion, productive cough or wheezing. Denies chest pains, palpitations and leg swelling Denies abdominal pain, nausea, vomiting,diarrhea or constipation.   Denies dysuria, frequency, hesitancy or incontinence. Denies joint pain, swelling and limitation in mobility. Denies headaches, seizures, numbness, or tingling.  Denies skin break down or rash.        Objective:   Physical Exam Patient alert and oriented and in no cardiopulmonary distress.  HEENT: No facial asymmetry, EOMI, no sinus tenderness,  oropharynx pink and moist.  Neck supple no adenopathy.  Chest: Clear to auscultation bilaterally.  CVS: S1, S2 no murmurs, no S3.  ABD: Soft non tender. Bowel sounds normal.  Ext: No edema  MS: Adequate ROM spine, shoulders, hips and knees.  Skin: Intact, no ulcerations or rash noted.  Psych: Good eye contact,  normal affect. Memory intact not anxious or depressed appearing.  CNS: CN 2-12 intact, power, tone and sensation normal throughout.        Assessment & Plan:

## 2012-04-06 ENCOUNTER — Other Ambulatory Visit: Payer: Self-pay | Admitting: Family Medicine

## 2012-04-24 ENCOUNTER — Encounter (HOSPITAL_COMMUNITY): Payer: Self-pay | Admitting: Psychiatry

## 2012-04-24 ENCOUNTER — Ambulatory Visit (INDEPENDENT_AMBULATORY_CARE_PROVIDER_SITE_OTHER): Payer: Medicare Other | Admitting: Psychiatry

## 2012-04-24 VITALS — Wt 188.8 lb

## 2012-04-24 DIAGNOSIS — F17200 Nicotine dependence, unspecified, uncomplicated: Secondary | ICD-10-CM

## 2012-04-24 DIAGNOSIS — F39 Unspecified mood [affective] disorder: Secondary | ICD-10-CM

## 2012-04-24 DIAGNOSIS — F319 Bipolar disorder, unspecified: Secondary | ICD-10-CM

## 2012-04-24 DIAGNOSIS — F411 Generalized anxiety disorder: Secondary | ICD-10-CM

## 2012-04-24 MED ORDER — BUPROPION HCL ER (SR) 150 MG PO TB12: 150.0000 mg | ORAL_TABLET | Freq: Two times a day (BID) | ORAL | Status: DC
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
## 2012-04-24 NOTE — Patient Instructions (Addendum)
L-Lysine 500 mg twice a day can decrease the incidence of cold sore break outs.  "The [place] to [place]" compiled by [employer] could be helpful.  "Native Wisdom for [employer] by Arty Baumgartner could be very helpful.  Strongly consider attending at least 34 Alanon Meetings to help you learn about how your helping others to the exclusion of helping yourself is actually hurting yourself and is actually an addiction to fixing others and that you need to work the 12 Step to Happiness through the [employer]. Al-Anon Family Groups could be helpful with how to deal with substance abusing family and friends. Or your own issues of being in victim role.  There are only 40 Alanon Family Group meetings a week here in [place].  Online are current listing of those meetings @ [place]alan[place].org/html/meetings.html  There are [employer].  Search on line and there you can learn the format and can access the schedule for yourself.  Their number is [phone]  Adult Children of Alcoholics has extraordinary literature that you might order and benefit from.   Could use "Move Free" or "Osteo bi Flex" for arthritic pain.   The important ingredients are Chondrotin Sulfate and Glucosamine.  Tumeric is also helpful for arthritis.   Krill oil and cod liver oil may be helpful for arthritis.   [employer] is a great source for all of these.  [phone]  SAMe 247m at night might help with getting to sleep or pushing to 4047mat night might help with getting to sleep.  It also may help with managing stress, anxiety, depression or joint pain.  [employer]'s Made is the best brand.    CUT BACK/CUT OUT on sugar and carbohydrates, that means very limited fruits and starchy vegetables and very limited grains, breads  The goal is low GLYCEMIC INDEX.  CUT OUT all wheat, rye, or barley for the GLUTEN in them.  HIGH fat and LOW carbohydrate diet is the KEY.  Eat avocados, eggs,  lean meat like grass fed beef and chicken  Nuts and seeds would be good foods as well.   Stevia is an excellent sweetener.  Safe for the brain.   Almond butter is awesome.  Check out all this on the Internet.  Kundalini Yoga is an awesome breath focused yoga.  Yoga is a very helpful exercise method.  On TV or on line Gaiam is a source of high quality information about yoga and videos on yoga.  RoUlysees Barnss the world's number one video yoga instructor according to some experts.  There are exceptional health benefits that can be achieved through yoga.  The main principles of yoga is acceptance, no competition, no comparison, and no judgement.  It is exceptional in helping people meditate and get to a very relaxed state.   "I am Wishes Fulfilled Meditation" by WaDarla Leschesnd JaHuey Romansay be helpful for meditation  Call if problems or concerns.

## 2012-05-18 ENCOUNTER — Other Ambulatory Visit: Payer: Self-pay | Admitting: Family Medicine

## 2012-06-21 ENCOUNTER — Ambulatory Visit (INDEPENDENT_AMBULATORY_CARE_PROVIDER_SITE_OTHER): Payer: BC Managed Care – PPO | Admitting: Psychiatry

## 2012-06-21 ENCOUNTER — Encounter (HOSPITAL_COMMUNITY): Payer: Self-pay | Admitting: Psychiatry

## 2012-06-21 VITALS — Wt 195.4 lb

## 2012-06-21 DIAGNOSIS — F319 Bipolar disorder, unspecified: Secondary | ICD-10-CM

## 2012-06-21 DIAGNOSIS — M545 Low back pain, unspecified: Secondary | ICD-10-CM

## 2012-06-21 DIAGNOSIS — F329 Major depressive disorder, single episode, unspecified: Secondary | ICD-10-CM

## 2012-06-21 DIAGNOSIS — F17200 Nicotine dependence, unspecified, uncomplicated: Secondary | ICD-10-CM | POA: Diagnosis not present

## 2012-06-21 DIAGNOSIS — G4730 Sleep apnea, unspecified: Secondary | ICD-10-CM

## 2012-06-21 DIAGNOSIS — M542 Cervicalgia: Secondary | ICD-10-CM

## 2012-06-21 DIAGNOSIS — F39 Unspecified mood [affective] disorder: Secondary | ICD-10-CM | POA: Diagnosis not present

## 2012-06-21 DIAGNOSIS — Z87891 Personal history of nicotine dependence: Secondary | ICD-10-CM

## 2012-06-21 DIAGNOSIS — F411 Generalized anxiety disorder: Secondary | ICD-10-CM

## 2012-06-21 MED ORDER — BUPROPION HCL ER (SR) 150 MG PO TB12: 150.0000 mg | ORAL_TABLET | Freq: Two times a day (BID) | ORAL | Status: DC

## 2012-06-21 NOTE — Patient Instructions (Addendum)
Could use "Move Free" or "Osteo bi Flex" for arthritic pain.   The important ingredients are Chondrotin Sulfate and Glucosamine.  Tumeric is also helpful for arthritis.   Krill oil and cod liver oil may be helpful for arthritis.   MegaChaga has oregeno in it asl well  [employer] is a great source for all of these.  [phone]  Dellie Catholic is a mushroom that has the strongest antiinflammatory properties of any substance known to mankind.  Among other sources, it can be ordered from [facility].com  CUT BACK/CUT OUT on sugar and carbohydrates, that means very limited fruits and starchy vegetables and very limited grains, breads  The goal is low GLYCEMIC INDEX.  CUT OUT all wheat, rye, or barley for the GLUTEN in them.  HIGH fat and LOW carbohydrate diet is the KEY.  Eat avocados, eggs, lean meat like grass fed beef and chicken  Nuts and seeds would be good foods as well.   Stevia is an excellent sweetener.  Safe for the brain.   Monia Pouch is also a good safe sweetener, not the baking blend form of Truvia  Almond butter is awesome.  Check out all this on the Internet.  Dr Annice Needy is on the Internet with some good info about this.   http://www.drperlmutter.com is where that is.  An excellent site for info on this diet is http://paleoleap.com  [employer] makes dark chocolate that is sweetened with Stevia that is safe.  Set a timer for 8 or a certain number minutes and walk for that amount of time in the house or in the yard.  Mark the number of minutes on a calendar for that day.  Do that every day this week.  Then next week increase the time by 1 minutes and then mark the calendar with the number of minutes for that day.  Each week increase your exercise by one minute.  Keep a record of this so you can see the progress you are making.  Do this every day, just like eating and sleeping.  It is good for pain control, depression, and for your soul/spirit.  Bring the  record in for your next visit so we can talk about your effort and how you feel with the new exercise program going and working for you.  Relaxation is the ultimate solution for you.  You can seek it through tub baths, bubble baths, essential oils or incense, walking or chatting with friends, listening to soft music, watching a candle burn and just letting all thoughts go and appreciating the true essence of the Creator.  Pets or animals may be very helpful.  You might spend some time with them and then go do more directed meditation.  Get serious about your body and health, check in with your doctor about the sleep apnea.  Take care of yourself.  No one else is standing up to do the job and only you know what you need.   GET SERIOUS about taking care of yourself.  Do the next right thing and that often means doing something to care for yourself along the lines of are you hungry, are you angry, are you lonely, are you tired, are you scared?  HALTS is what that stands for.  Call if problems or concerns.
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
## 2012-06-22 ENCOUNTER — Ambulatory Visit (HOSPITAL_COMMUNITY): Payer: Self-pay | Admitting: Psychiatry

## 2012-07-19 ENCOUNTER — Other Ambulatory Visit: Payer: Self-pay | Admitting: Family Medicine

## 2012-07-19 ENCOUNTER — Encounter: Payer: Self-pay | Admitting: Internal Medicine

## 2012-07-19 DIAGNOSIS — Z139 Encounter for screening, unspecified: Secondary | ICD-10-CM

## 2012-07-23 ENCOUNTER — Other Ambulatory Visit (HOSPITAL_COMMUNITY): Payer: Self-pay

## 2012-07-23 ENCOUNTER — Other Ambulatory Visit: Payer: Self-pay

## 2012-07-23 MED ORDER — ADALIMUMAB 40 MG/0.8ML ~~LOC~~ KIT: 40.0000 mg | PACK | SUBCUTANEOUS | Status: DC

## 2012-07-25 ENCOUNTER — Ambulatory Visit (HOSPITAL_COMMUNITY): Payer: Medicare Other

## 2012-08-01 ENCOUNTER — Telehealth: Payer: Self-pay

## 2012-08-01 ENCOUNTER — Ambulatory Visit: Payer: Medicare Other | Admitting: Family Medicine

## 2012-08-01 MED ORDER — ADALIMUMAB 40 MG/0.8ML ~~LOC~~ KIT: 40.0000 mg | PACK | SUBCUTANEOUS | Status: DC

## 2012-08-01 NOTE — Telephone Encounter (Signed)
[facility] called([phone])- they received new rx for humira, but humira doesn't come in a vial, they need new rx sent in for the "pens"

## 2012-08-01 NOTE — Telephone Encounter (Signed)
Done

## 2012-08-03 ENCOUNTER — Encounter: Payer: Self-pay | Admitting: Family Medicine

## 2012-08-03 ENCOUNTER — Ambulatory Visit (INDEPENDENT_AMBULATORY_CARE_PROVIDER_SITE_OTHER): Payer: Medicare Other | Admitting: Family Medicine

## 2012-08-03 VITALS — BP 120/80 | HR 97 | Resp 18 | Ht 67.0 in | Wt 199.0 lb

## 2012-08-03 DIAGNOSIS — F3289 Other specified depressive episodes: Secondary | ICD-10-CM

## 2012-08-03 DIAGNOSIS — F329 Major depressive disorder, single episode, unspecified: Secondary | ICD-10-CM

## 2012-08-03 DIAGNOSIS — G4730 Sleep apnea, unspecified: Secondary | ICD-10-CM

## 2012-08-03 DIAGNOSIS — E669 Obesity, unspecified: Secondary | ICD-10-CM

## 2012-08-03 DIAGNOSIS — E66811 Obesity, class 1: Secondary | ICD-10-CM

## 2012-08-03 DIAGNOSIS — J45909 Unspecified asthma, uncomplicated: Secondary | ICD-10-CM

## 2012-08-03 DIAGNOSIS — K5090 Crohn's disease, unspecified, without complications: Secondary | ICD-10-CM

## 2012-08-03 DIAGNOSIS — D72829 Elevated white blood cell count, unspecified: Secondary | ICD-10-CM

## 2012-08-03 MED ORDER — ALBUTEROL SULFATE HFA 108 (90 BASE) MCG/ACT IN AERS: 2.0000 | INHALATION_SPRAY | Freq: Four times a day (QID) | RESPIRATORY_TRACT | Status: DC | PRN

## 2012-08-03 NOTE — Assessment & Plan Note (Signed)
No recent flares, but has a dx of asthma since childhood, script sent in for rescue inhaler

## 2012-08-03 NOTE — Patient Instructions (Addendum)
F/u in 6 month  Congrats on smoking cessation   Please commit to daily physical activity to improve health and help with weight loss.   Weight loss goal of 3 to 4 pounds per month   We will add cholesterol to recent labs since they were fasting  Albuterol sent in  You are referred for sleep eval  , Mammogram in June

## 2012-08-03 NOTE — Assessment & Plan Note (Signed)
Followed by GI on a regular basis

## 2012-08-03 NOTE — Assessment & Plan Note (Signed)
C/o excessive snoring and excessive daytime sleepiness. Will refer for sleep eval

## 2012-08-03 NOTE — Assessment & Plan Note (Signed)
Has upcoming f/u appt with hematology

## 2012-08-03 NOTE — Assessment & Plan Note (Signed)
Deteriorated. Patient re-educated about  the importance of commitment to a  minimum of 150 minutes of exercise per week. The importance of healthy food choices with portion control discussed. Encouraged to start a food diary, count calories and to consider  joining a support group. Sample diet sheets offered. Goals set by the patient for the next several months.    

## 2012-08-03 NOTE — Progress Notes (Signed)
  Subjective:    Patient ID: [name], female    DOB: 1971-01-30, 42 y.o.   MRN: [id number]  HPI The PT is here for follow up and re-evaluation of chronic medical conditions, medication management and review of any available recent lab and radiology data.  Preventive health is updated, specifically  Cancer screening and Immunization.   Questions or concerns regarding consultations or procedures which the PT has had in the interim are  Addressed.She is very pleased with her mental health situation, and is stable and controlled on welbutrin, which has also helped her to stop cigarette smoking. Has c/o excessive daytime sleepiness and snoring, requests referral for sleep study, this has already been discussed with her psych who initially suggested this The PT denies any adverse reactions to current medications since the last visit.  There are no new concerns.      Review of Systems See HPI Denies recent fever or chills. Denies sinus pressure, nasal congestion, ear pain or sore throat. Denies chest congestion, productive cough or wheezing. Denies chest pains, palpitations and leg swelling Denies abdominal pain, nausea, vomiting,diarrhea or constipation.   Denies dysuria, frequency, hesitancy or incontinence. Denies joint pain, swelling and limitation in mobility. Denies headaches, seizures, numbness, or tingling. Denies uncontrolled  depression, anxiety or insomnia. Denies skin break down or rash.        Objective:   Physical Exam  Patient alert and oriented and in no cardiopulmonary distress.  HEENT: No facial asymmetry, EOMI, no sinus tenderness,  oropharynx pink and moist.  Neck supple no adenopathy.  Chest: Clear to auscultation bilaterally.  CVS: S1, S2 no murmurs, no S3.  ABD: Soft non tender. Bowel sounds normal.  Ext: No edema  MS: Adequate ROM spine, shoulders, hips and knees.  Skin: Intact, no ulcerations or rash noted.  Psych: Good eye contact, normal  affect. Memory intact not anxious or depressed appearing.  CNS: CN 2-12 intact, power, tone and sensation normal throughout.       Assessment & Plan:

## 2012-08-03 NOTE — Assessment & Plan Note (Signed)
Controlled and stable, being treated by psychiatry. Wellbutrin has helped with smoking cessation, she has quit!

## 2012-08-06 ENCOUNTER — Other Ambulatory Visit: Payer: Self-pay

## 2012-08-06 DIAGNOSIS — J45909 Unspecified asthma, uncomplicated: Secondary | ICD-10-CM

## 2012-08-06 MED ORDER — ALBUTEROL SULFATE HFA 108 (90 BASE) MCG/ACT IN AERS: 2.0000 | INHALATION_SPRAY | Freq: Four times a day (QID) | RESPIRATORY_TRACT | Status: DC | PRN

## 2012-08-08 ENCOUNTER — Encounter (HOSPITAL_COMMUNITY): Payer: Medicare Other | Attending: Oncology

## 2012-08-08 DIAGNOSIS — D72829 Elevated white blood cell count, unspecified: Secondary | ICD-10-CM | POA: Insufficient documentation

## 2012-08-08 DIAGNOSIS — K5090 Crohn's disease, unspecified, without complications: Secondary | ICD-10-CM | POA: Insufficient documentation

## 2012-08-08 LAB — DIFFERENTIAL
Basophils Absolute: 0.1 10*3/uL (ref 0.0–0.1)
Basophils Relative: 0 % (ref 0–1)
Eosinophils Absolute: 0.6 10*3/uL (ref 0.0–0.7)
Eosinophils Relative: 4 % (ref 0–5)

## 2012-08-08 LAB — CBC
MCH: 28.1 pg (ref 26.0–34.0)
MCV: 83.8 fL (ref 78.0–100.0)
Platelets: 343 10*3/uL (ref 150–400)
RDW: 15.1 % (ref 11.5–15.5)
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
## 2012-08-08 NOTE — Progress Notes (Signed)
Labs drawn today for cbc/diff

## 2012-08-09 ENCOUNTER — Encounter (HOSPITAL_COMMUNITY): Payer: Self-pay | Admitting: Oncology

## 2012-08-09 ENCOUNTER — Encounter (HOSPITAL_BASED_OUTPATIENT_CLINIC_OR_DEPARTMENT_OTHER): Payer: Medicare Other | Admitting: Oncology

## 2012-08-09 VITALS — BP 114/73 | HR 79 | Temp 98.3°F | Resp 18 | Wt 202.1 lb

## 2012-08-09 DIAGNOSIS — Z87891 Personal history of nicotine dependence: Secondary | ICD-10-CM

## 2012-08-09 DIAGNOSIS — K5090 Crohn's disease, unspecified, without complications: Secondary | ICD-10-CM

## 2012-08-09 DIAGNOSIS — D72829 Elevated white blood cell count, unspecified: Secondary | ICD-10-CM

## 2012-08-09 NOTE — Patient Instructions (Addendum)
[facility] Discharge Instructions  RECOMMENDATIONS MADE BY THE CONSULTANT AND ANY TEST RESULTS WILL BE SENT TO YOUR REFERRING PHYSICIAN.  MEDICATIONS PRESCRIBED:  None  INSTRUCTIONS GIVEN AND DISCUSSED: None  SPECIAL INSTRUCTIONS/FOLLOW-UP: Labs in 1 year, follow-up appointment in 1 year  Thank you for choosing [facility] to provide your oncology and hematology care.  To afford each patient quality time with our providers, please arrive at least 15 minutes before your scheduled appointment time.  With your help, our goal is to use those 15 minutes to complete the necessary work-up to ensure our physicians have the information they need to help with your evaluation and healthcare recommendations.    Effective January 1st, 2014, we ask that you re-schedule your appointment with our physicians should you arrive 10 or more minutes late for your appointment.  We strive to give you quality time with our providers, and arriving late affects you and other patients whose appointments are after yours.    Again, thank you for choosing [facility].  Our hope is that these requests will decrease the amount of time that you wait before being seen by our physicians.       _____________________________________________________________  Should you have questions after your visit to [facility], please contact our office at [phone] between the hours of 8:30 a.m. and 5:00 p.m.  Voicemails left after 4:30 p.m. will not be returned until the following business day.  For prescription refill requests, have your pharmacy contact our office with your prescription refill request.

## 2012-08-13 ENCOUNTER — Other Ambulatory Visit: Payer: Self-pay | Admitting: Family Medicine

## 2012-08-13 ENCOUNTER — Ambulatory Visit (INDEPENDENT_AMBULATORY_CARE_PROVIDER_SITE_OTHER): Payer: Medicare Other | Admitting: Gastroenterology

## 2012-08-13 ENCOUNTER — Encounter: Payer: Self-pay | Admitting: Gastroenterology

## 2012-08-13 VITALS — BP 115/74 | HR 75 | Temp 97.6°F | Ht 65.0 in | Wt 198.0 lb

## 2012-08-13 DIAGNOSIS — K5090 Crohn's disease, unspecified, without complications: Secondary | ICD-10-CM

## 2012-08-13 NOTE — Patient Instructions (Addendum)
We will see you in 6 months!  You may take a probiotic daily. Some examples are Digestive Advantage, [employer]'s brand, Phillip's Colon Health, Restora, and Align.

## 2012-08-13 NOTE — Assessment & Plan Note (Signed)
42 year old female with ileocolonic Crohn's disease, doing wonderfully on Humira. Clinically in remission, with last TCS in Jan 2013. No complaints or concerns currently. She has gained some weight since last visit, but she is actively attempting to lose this through diet and exercise. She has had a bone density scan late 2013 that was normal.   Continue Humira  Probiotic daily Return in 6 months

## 2012-08-13 NOTE — Progress Notes (Signed)
[facility]c PCP 
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
## 2012-08-20 ENCOUNTER — Ambulatory Visit (HOSPITAL_COMMUNITY): Payer: Self-pay

## 2012-08-21 ENCOUNTER — Ambulatory Visit (HOSPITAL_COMMUNITY): Payer: Self-pay | Admitting: Psychiatry

## 2012-08-24 ENCOUNTER — Ambulatory Visit (HOSPITAL_COMMUNITY): Payer: Self-pay | Admitting: Oncology

## 2012-09-03 ENCOUNTER — Ambulatory Visit (HOSPITAL_COMMUNITY)
Admission: RE | Admit: 2012-09-03 | Discharge: 2012-09-03 | Disposition: A | Discharge status: Home / Self Care | Payer: Medicare Other | Source: Ambulatory Visit | Attending: Family Medicine | Admitting: Family Medicine

## 2012-09-03 DIAGNOSIS — Z139 Encounter for screening, unspecified: Secondary | ICD-10-CM

## 2012-09-03 DIAGNOSIS — Z1231 Encounter for screening mammogram for malignant neoplasm of breast: Secondary | ICD-10-CM | POA: Insufficient documentation

## 2012-09-04 ENCOUNTER — Ambulatory Visit (HOSPITAL_COMMUNITY): Payer: Self-pay | Admitting: Psychiatry

## 2012-09-06 ENCOUNTER — Ambulatory Visit (HOSPITAL_COMMUNITY): Payer: Self-pay | Admitting: Psychiatry

## 2012-09-06 ENCOUNTER — Encounter (HOSPITAL_COMMUNITY): Payer: Self-pay | Admitting: Psychiatry

## 2012-09-06 ENCOUNTER — Ambulatory Visit (INDEPENDENT_AMBULATORY_CARE_PROVIDER_SITE_OTHER): Payer: BC Managed Care – PPO | Admitting: Psychiatry

## 2012-09-06 VITALS — BP 123/72 | HR 86 | Wt 200.0 lb

## 2012-09-06 DIAGNOSIS — F339 Major depressive disorder, recurrent, unspecified: Secondary | ICD-10-CM

## 2012-09-06 DIAGNOSIS — F329 Major depressive disorder, single episode, unspecified: Secondary | ICD-10-CM

## 2012-09-06 NOTE — Progress Notes (Signed)
[name] [phone] Progress Note  [name] [id number] 42 y.o.  09/06/2012 9:50 AM  Chief Complaint: I'm doing better on Wellbutrin.  History of Present Illness: Patient is a 42 year old [place] American female who came for her followup appointment.  She has seen this Probation officer in in this office from 2005-2011.  She recently started seeing psychiatrist for the medication management.  She was referred from primary care physician.  Initially it was for nicotine cessation .  She tried Wellbutrin which is helping her depression and patient is no longer smoking.  She continues to have some stress about her physical condition and family issues but overall she is doing much better.  She scheduled to see Dr. Johnnette Gourd for persistent headaches and sleep studies.  She is seeing Dr. Quincy Sheehan for Crohn's disease.  She sleeping better.  She has any recent agitation anger or mood swing.  She likes Wellbutrin.  There are times when she feels missing thoughts but she denies any recent crying spells anhedonia or any changes in her appetite.  She has any paranoia or any hallucination.  She's getting Wellbutrin from her primary care physician.    Suicidal Ideation: No Plan Formed: No Patient has means to carry out plan: No  Homicidal Ideation: No Plan Formed: No Patient has means to carry out plan: No  Review of Systems: Psychiatric: Agitation: No Hallucination: No Depressed Mood: No Insomnia: Yes Hypersomnia: No Altered Concentration: No Feels Worthless: No Grandiose Ideas: No Belief In Special Powers: No New/Increased Substance Abuse: No Compulsions: No  Neurologic: Headache: Yes Seizure: No Paresthesias: No  Social History: Patient lives with her husband and children.  She has one son and 2 daughter.  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  She likes reading listing music walking and fishing.  Medical history Patient has multiple medical issues.  She has asthma, Crohn's disease, acid reflux,  headaches, anemia, sleep apnea and arthritis.  Her primary care physician is Dr. Moshe Cipro.  Her neurologist is Dr Elpidio Galea and her gastroenterologist is Dr. Quincy Sheehan.   Alcohol and substance use history. Patient admitted using marijuana but claims to be sober since 2012. Outpatient Encounter Prescriptions as of 09/06/2012  Medication Sig Dispense Refill  . adalimumab (HUMIRA) 40 MG/0.8ML injection Inject 0.8 mLs (40 mg total) into the skin every 14 (fourteen) days.  2 each  11  . albuterol (PROVENTIL HFA;VENTOLIN HFA) 108 (90 BASE) MCG/ACT inhaler Inhale 2 puffs into the lungs every 6 (six) hours as needed for wheezing.  1 Inhaler  2  . buPROPion (WELLBUTRIN SR) 150 MG 12 hr tablet Take 1 tablet (150 mg total) by mouth 2 (two) times daily. Take afternoon dose before  4 PM.  60 tablet  3  . buPROPion (WELLBUTRIN SR) 150 MG 12 hr tablet TAKE ONE TABLET TWICE DAILY  60 tablet  2  . calcium-vitamin D (OSCAL) 250-125 MG-UNIT per tablet Take 1 tablet by mouth daily.        . Cyanocobalamin (B-12) 1000 MCG CAPS Take 1 capsule by mouth daily.        . diphenhydrAMINE (BENADRYL) 25 mg capsule Take 25 mg by mouth daily as needed. For allergies       . fish oil-omega-3 fatty acids 1000 MG capsule Take 3 g by mouth daily.       Marland Kitchen KLOR-CON M20 20 MEQ tablet TAKE ONE TABLET BY MOUTH TWICE A DAY  60 tablet  3  . Liniments (SALONPAS ARTHRITIS PAIN RELIEF) PADS Apply 1 each  topically every 8 (eight) hours as needed. pain      . Multiple Vitamin (MULTIVITAMIN) tablet Take 1 tablet by mouth daily.      Marland Kitchen omeprazole (PRILOSEC) 20 MG capsule Take 1 capsule (20 mg total) by mouth 2 (two) times daily before a meal.  60 capsule  11   No facility-administered encounter medications on file as of 09/06/2012.    Past Psychiatric History/Hospitalization(s): Patient has multiple psychiatric hospitalizations in the past.  Her last psychiatric hospitalization in 2005 but she also admitted hospitalization in 2008 which we do not have  details.  She has history of taking overdose on her medication .  She has history of depression and she was also diagnosed with bipolar disorder.  In the past she has taken Seroquel, Risperdal, Celexa, Lexapro, and Xanax.  She has seen psychologist in the past.  Anxiety: No Bipolar Disorder: Yes Depression: Yes Mania: Yes Psychosis: No Schizophrenia: No Personality Disorder: No Hospitalization for psychiatric illness: Yes History of Electroconvulsive Shock Therapy: No Prior Suicide Attempts: No  Physical Exam: Constitutional:  BP 123/72  Pulse 86  Wt 200 lb (90.719 kg)  BMI 33.28 kg/m2  General Appearance: alert, oriented, no acute distress and well nourished  Musculoskeletal: Strength & Muscle Tone: within normal limits Gait & Station: normal Patient leans: N/A  Psychiatric: Speech (describe rate, volume, coherence, spontaneity, and abnormalities if any):  clear and coherent normal tone and volume.    Thought Process (describe rate, content, abstract reasoning, and computation):  slow but logical and goal-directed.    Associations: Coherent and Intact  Thoughts: normal  Mental Status: Orientation: oriented to person, place and time/date Mood & Affect: anxiety Attention Span & Concentration:  fair   Medical Decision Making (Choose Three): Established Problem, Stable/Improving (1), Review of Psycho-Social Stressors (1), Review or order clinical lab tests (1), Review and summation of old records (2), Review of Last Therapy Session (1) and Review of Medication Regimen & Side Effects (2)  Assessment: Axis I: Maj. depressive disorder, recurrent  Axis II: deferred  Axis III:  Patient Active Problem List   Diagnosis Date Noted  . Apnea, sleep 06/21/2012  . Obesity (BMI 30.0-34.9) 04/04/2012  . Bipolar disorder 04/03/2012  . Hx of nicotine dependence 11/14/2011  . Routine general medical examination at a health care facility 11/14/2011  . Anterior cervical adenopathy  07/27/2011  . Foot dermatitis 07/22/2011  . High risk for colon cancer 03/15/2011  . Leukocytosis 10/06/2010  . Eczema 08/05/2010  . HSV 08/10/2009  . ECHOCARDIOGRAM, ABNORMAL 01/11/2008  . ANKLE PAIN, CHRONIC 12/28/2007  . Leukocytosis, unspecified 09/11/2007  . HEADACHE, TENSION 07/17/2007  . HYPERGLYCEMIA 02/13/2007  . CROHN'S DISEASE 05/15/2006  . ANEMIA, B12 DEFICIENCY 12/29/2005  . ANXIETY 12/29/2005  . DEPRESSION 12/29/2005  . ALLERGIC RHINITIS 12/29/2005  . ASTHMA 12/29/2005  . GERD 12/29/2005  . PEPTIC ULCER DISEASE 12/29/2005  . ARTHRITIS, GENERALIZED 12/29/2005  . HERNIORRHAPHY, HX OF 12/29/2005    Axis IV: mild to moderate  Axis V:  55-60   Plan:  I review her symptoms, history, records from her primary care physician , labs and current medication.  Patient is doing much better on Wellbutrin SR 150 mg twice a day.  She is no longer taking Celexa.  She is happy that she able to keep the weight unchanged from the past and no longer smoking.  Patient does have some time irritability and frustration but overall she is doing much better.  Discussed in detail the risk  and benefits of medication.  Recommend to continue Wellbutrin which is prescribed by her primary care physician.  I also recommend to see therapist if she continues to have irritability and frustration .  I recommend to call us back if she is any question of conservatively worsening of the symptom.  Otherwise I will see her in 3 months.  Time spent 25 minutes.  More than 50% of the time spent and psychoeducation, counseling and coordination of care.    Naleah Kofoed T., MD 09/06/2012

## 2012-09-13 ENCOUNTER — Other Ambulatory Visit: Payer: Self-pay | Admitting: Family Medicine

## 2012-09-14 ENCOUNTER — Other Ambulatory Visit: Payer: Self-pay

## 2012-09-14 MED ORDER — POTASSIUM CHLORIDE CRYS ER 20 MEQ PO TBCR: EXTENDED_RELEASE_TABLET | ORAL | Status: DC

## 2012-09-14 NOTE — Telephone Encounter (Signed)
Med refilled.

## 2012-09-14 NOTE — Telephone Encounter (Signed)
[facility] pt

## 2012-10-03 ENCOUNTER — Other Ambulatory Visit: Payer: Self-pay | Admitting: Neurology

## 2012-10-03 DIAGNOSIS — G4730 Sleep apnea, unspecified: Secondary | ICD-10-CM

## 2012-10-15 ENCOUNTER — Encounter: Payer: Self-pay | Admitting: Family Medicine

## 2012-10-15 ENCOUNTER — Ambulatory Visit (INDEPENDENT_AMBULATORY_CARE_PROVIDER_SITE_OTHER): Payer: Medicare Other | Admitting: Family Medicine

## 2012-10-15 VITALS — BP 108/70 | HR 80 | Resp 18 | Ht 67.0 in | Wt 198.0 lb

## 2012-10-15 DIAGNOSIS — Z139 Encounter for screening, unspecified: Secondary | ICD-10-CM

## 2012-10-15 DIAGNOSIS — E66811 Obesity, class 1: Secondary | ICD-10-CM

## 2012-10-15 DIAGNOSIS — J45909 Unspecified asthma, uncomplicated: Secondary | ICD-10-CM

## 2012-10-15 DIAGNOSIS — F319 Bipolar disorder, unspecified: Secondary | ICD-10-CM

## 2012-10-15 DIAGNOSIS — E669 Obesity, unspecified: Secondary | ICD-10-CM

## 2012-10-15 NOTE — Patient Instructions (Signed)
F/u mid December, call if you need me before  Call in October for flu vaccine  Fasting lipid and chem 7 tomorrow  Continue daily exercise, cut back sugar and carbs, mainly watery vegetables an fruit Reduce portion size  Weight loss goal of 2.5 pounds per month

## 2012-10-15 NOTE — Assessment & Plan Note (Signed)
Stable no recent flares

## 2012-10-15 NOTE — Assessment & Plan Note (Signed)
Unchanged. Patient re-educated about  the importance of commitment to a  minimum of 150 minutes of exercise per week. The importance of healthy food choices with portion control discussed. Encouraged to start a food diary, count calories and to consider  joining a support group. Sample diet sheets offered. Goals set by the patient for the next several months.    

## 2012-10-15 NOTE — Assessment & Plan Note (Signed)
Controlled followed by psychiatry

## 2012-10-15 NOTE — Progress Notes (Signed)
  Subjective:    Patient ID: [name], female    DOB: 1970-07-15, 42 y.o.   MRN: [id number]  HPI  Pt in for health evalaution for health insurance purposes, "wellneess exam" where focus is on nicotine use, weight, Blood pressure, blood sugar and l;ipid status. Pt states she  Feels well. She is enrolled in a "welllness program" and is commited to daily exercise, healthy eating and has quit nicotine since March this year. Her goal is to achieve a healthy weight , and continue daily healthy habits  Review of Systems See HPI Denies recent fever or chills. Denies sinus pressure, nasal congestion, ear pain or sore throat. Denies chest congestion, productive cough or wheezing. Denies chest pains, palpitations and leg swelling Denies abdominal pain, nausea, vomiting,diarrhea or constipation.   Denies dysuria, frequency, hesitancy or incontinence. Denies joint pain, swelling and limitation in mobility. Denies headaches, seizures, numbness, or tingling. Denies depression, anxiety or insomnia. Denies skin break down or rash.        Objective:   Physical Exam  Patient alert and oriented and in no cardiopulmonary distress.  HEENT: No facial asymmetry, EOMI, no sinus tenderness,  oropharynx pink and moist.  Neck supple no adenopathy.  Chest: Clear to auscultation bilaterally.  CVS: S1, S2 no murmurs, no S3.  ABD: Soft non tender. Bowel sounds normal.  Ext: No edema  MS: Adequate ROM spine, shoulders, hips and knees.  Skin: Intact, no ulcerations or rash noted.  Psych: Good eye contact, normal affect. Memory intact not anxious or depressed appearing.  CNS: CN 2-12 intact, power, tone and sensation normal throughout.       Assessment & Plan:

## 2012-10-16 ENCOUNTER — Other Ambulatory Visit: Payer: Self-pay

## 2012-10-16 ENCOUNTER — Ambulatory Visit: Payer: Medicare Other | Attending: Neurology | Admitting: Sleep Medicine

## 2012-10-16 ENCOUNTER — Encounter: Payer: Self-pay | Admitting: Family Medicine

## 2012-10-16 DIAGNOSIS — G4730 Sleep apnea, unspecified: Secondary | ICD-10-CM

## 2012-10-16 DIAGNOSIS — G4733 Obstructive sleep apnea (adult) (pediatric): Secondary | ICD-10-CM | POA: Insufficient documentation

## 2012-10-16 LAB — BASIC METABOLIC PANEL
CO2: 25 mEq/L (ref 19–32)
Glucose, Bld: 66 mg/dL — ABNORMAL LOW (ref 70–99)
Potassium: 4.1 mEq/L (ref 3.5–5.3)
Sodium: 137 mEq/L (ref 135–145)

## 2012-10-16 MED ORDER — OMEPRAZOLE 20 MG PO CPDR: 20.0000 mg | DELAYED_RELEASE_CAPSULE | Freq: Two times a day (BID) | ORAL | Status: DC

## 2012-10-27 NOTE — Procedures (Signed)
Clare A. Merlene Laughter, MD     [email or web address]        NAMETACHE, BOBST                ACCOUNT NO.:  [id number]  MEDICAL RECORD NO.:  [id number]          PATIENT TYPE:  OUT  LOCATION:  SLEEP LAB                     FACILITY:  [facility]  PHYSICIAN:  Brendan Gruwell A. Merlene Laughter, M.D. DATE OF BIRTH:  1970-03-31  DATE OF STUDY:                           NOCTURNAL POLYSOMNOGRAM  REFERRING PHYSICIAN:  Jayana Kotula A. Merlene Laughter, M.D.  INDICATION:  A 42 year old who presents with hypersomnia, loud snoring, and difficulty sleeping.   MEDICATIONS:  Vitamin B12, omeprazole, potassium, and Humira.  EPWORTH SLEEPINESS SCALE:  8.  BMI:  31.  ARCHITECTURAL SUMMARY:  The total recording time is 368 minutes.  Sleep efficiency 85%.  Sleep latency 5 minutes.  REM latency 87 minutes. Stage N1 7%, N2 68%, N3 2%, and REM sleep 23%.  RESPIRATORY SUMMARY:  Baseline oxygen saturation is 98, lowest saturation was 78 during non-REM sleep.  The diagnostic AHI is 22 and RDI also 22.  LIMB MOVEMENT SUMMARY:  PLM index 0.  ELECTROCARDIOGRAM SUMMARY:  Average heart rate is 83 with no significant dysrhythmias observed.  IMPRESSION:  Moderate obstructive sleep apnea syndrome.  RECOMMENDATION:  Formal CPAP titration study.  Thanks for this referral.    Theordore Cisnero A. Merlene Laughter, M.D.    KAD/MEDQ  D:  10/27/2012 20:04:40  T:  10/27/2012 20:13:30  Job:  [id number]

## 2012-11-07 ENCOUNTER — Ambulatory Visit (HOSPITAL_COMMUNITY): Payer: Self-pay | Admitting: Psychiatry

## 2012-11-15 ENCOUNTER — Telehealth: Payer: Self-pay

## 2012-11-15 ENCOUNTER — Other Ambulatory Visit: Payer: Self-pay | Admitting: Family Medicine

## 2012-11-15 MED ORDER — VALACYCLOVIR HCL 1 G PO TABS: 2000.0000 mg | ORAL_TABLET | Freq: Two times a day (BID) | ORAL | Status: DC

## 2012-11-15 NOTE — Telephone Encounter (Signed)
Take 2 tablets twice a day for 1 day.  Call PCP for further issues.  RX sent.

## 2012-11-15 NOTE — Telephone Encounter (Signed)
Pt called- she got her humira injection 2 days ago and woke up this morning with "fever blisters" on her lips. She stated it happened because of the humira and it has happened before and she is requesting rx for valtrex called in to [place].

## 2012-11-23 ENCOUNTER — Ambulatory Visit (INDEPENDENT_AMBULATORY_CARE_PROVIDER_SITE_OTHER): Payer: BC Managed Care – PPO | Admitting: Psychiatry

## 2012-11-23 ENCOUNTER — Encounter (HOSPITAL_COMMUNITY): Payer: Self-pay | Admitting: Psychiatry

## 2012-11-23 ENCOUNTER — Telehealth (HOSPITAL_COMMUNITY): Payer: Self-pay | Admitting: Psychiatry

## 2012-11-23 VITALS — BP 120/80 | Ht 68.0 in | Wt 196.0 lb

## 2012-11-23 DIAGNOSIS — F329 Major depressive disorder, single episode, unspecified: Secondary | ICD-10-CM

## 2012-11-23 DIAGNOSIS — F339 Major depressive disorder, recurrent, unspecified: Secondary | ICD-10-CM | POA: Diagnosis not present

## 2012-11-23 MED ORDER — BUPROPION HCL ER (XL) 150 MG PO TB24: 150.0000 mg | ORAL_TABLET | Freq: Two times a day (BID) | ORAL | Status: DC

## 2012-11-23 NOTE — Telephone Encounter (Signed)
Change to SR form

## 2012-11-23 NOTE — Progress Notes (Signed)
Patient ID: [name], female   DOB: [birth year], 42 y.o.   MRN: [id number]  [name] [id number] Progress Note  [name] [id number] 42 y.o.  11/23/2012 11:37 AM  Chief Complaint: "I'm doing well but my grandmother just died."  History of Present Illness:  This patient is a 42 year old black female who is married and lives with her 3 children and husband in [place]. She is on disability for Crohn's disease.  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 She got severely constipated with a high fever . Once it was determined what was wrong she's had an up-and-down course with her Crohn's. Currently is under good control because she has figured out which foods make it worse. The patient began depressed after developing Crohn's and was hospitalized twice at behavioral health hospital in the past. Prior this was complicated by the fact that she was abusing marijuana.  Currently the patient has been doing well for the most part. She recently had a sleep study and has mild sleep apnea and will need a CPAP machine. Her grandmother died last week which has been very difficult for her and the entire family. The grandmother raised the patient because her own mother was not involved. She also helped to raise the patient's children. The patient is very involved in church and this is been helpful for her. She feels like she will carry out just fine because of her grandmothers like to see but she would like to start some counseling. She does feel like Wellbutrin has been helpful for her mood stabilization. She denies suicidal ideation or     Suicidal Ideation: No Plan Formed: No Patient has means to carry out plan: No  Homicidal Ideation: No Plan Formed: No Patient has means to carry out plan: No  Review of Systems: Psychiatric: Agitation: No Hallucination: No Depressed Mood: No Insomnia: Yes Hypersomnia: No Altered Concentration:  No Feels Worthless: No Grandiose Ideas: No Belief In Special Powers: No New/Increased Substance Abuse: No Compulsions: No  Neurologic: Headache: Yes Seizure: No Paresthesias: No  Social History: Patient lives with her husband and children.  She has one son and 2 daughter.  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  She likes reading listing music walking and fishing.  Medical history Patient has multiple medical issues.  She has asthma, Crohn's disease, acid reflux, headaches, anemia, sleep apnea and arthritis.  Her primary care physician is Dr. Moshe Cipro.  Her neurologist is Dr Elpidio Galea and her gastroenterologist is Dr. Quincy Sheehan.   Alcohol and substance use history. Patient admitted using marijuana but claims to be sober since 2012. Outpatient Encounter Prescriptions as of 11/23/2012  Medication Sig Dispense Refill  . adalimumab (HUMIRA) 40 MG/0.8ML injection Inject 0.8 mLs (40 mg total) into the skin every 14 (fourteen) days.  2 each  11  . albuterol (PROVENTIL HFA;VENTOLIN HFA) 108 (90 BASE) MCG/ACT inhaler Inhale 2 puffs into the lungs every 6 (six) hours as needed for wheezing.  1 Inhaler  2  . buPROPion (WELLBUTRIN SR) 150 MG 12 hr tablet TAKE ONE TABLET TWICE DAILY  60 tablet  1  . calcium-vitamin D (OSCAL) 250-125 MG-UNIT per tablet Take 1 tablet by mouth daily.        . Cyanocobalamin (B-12) 1000 MCG CAPS Take 1 capsule by mouth daily.        . diphenhydrAMINE (BENADRYL) 25 mg capsule Take 25 mg by mouth daily as needed. For allergies       .  fish oil-omega-3 fatty acids 1000 MG capsule Take 3 g by mouth daily.       . Liniments (SALONPAS ARTHRITIS PAIN RELIEF) PADS Apply 1 each topically every 8 (eight) hours as needed. pain      . Multiple Vitamin (MULTIVITAMIN) tablet Take 1 tablet by mouth daily.      Marland Kitchen omeprazole (PRILOSEC) 20 MG capsule Take 1 capsule (20 mg total) by mouth 2 (two) times daily before a meal.  60 capsule  11  . potassium chloride SA (KLOR-CON M20) 20 MEQ tablet TAKE ONE  TABLET BY MOUTH TWICE A DAY  60 tablet  3  . buPROPion (WELLBUTRIN XL) 150 MG 24 hr tablet Take 1 tablet (150 mg total) by mouth 2 (two) times daily.  60 tablet  2  . valACYclovir (VALTREX) 1000 MG tablet Take 2 tablets (2,000 mg total) by mouth every 12 (twelve) hours. For 1 day.  4 tablet  0   No facility-administered encounter medications on file as of 11/23/2012.    Past Psychiatric History/Hospitalization(s): Patient has multiple psychiatric hospitalizations in the past.  Her last psychiatric hospitalization in 2005 but she also admitted hospitalization in 2008 which we do not have details.  She has history of taking overdose on her medication .  She has history of depression and she was also diagnosed with bipolar disorder.  In the past she has taken Seroquel, Risperdal, Celexa, Lexapro, and Xanax.  She has seen psychologist in the past.  Anxiety: No Bipolar Disorder: Yes Depression: Yes Mania: Yes Psychosis: No Schizophrenia: No Personality Disorder: No Hospitalization for psychiatric illness: Yes History of Electroconvulsive Shock Therapy: No Prior Suicide Attempts: No  Physical Exam: Constitutional:  BP 120/80  Ht 5' 8"  (1.727 m)  Wt 196 lb (88.905 kg)  BMI 29.81 kg/m2  General Appearance: alert, oriented, no acute distress and well nourished  Musculoskeletal: Strength & Muscle Tone: within normal limits Gait & Station: normal Patient leans: N/A  Psychiatric: Speech (describe rate, volume, coherence, spontaneity, and abnormalities if any):  clear and coherent normal tone and volume.    Thought Process (describe rate, content, abstract reasoning, and computation):  slow but logical and goal-directed.    Associations: Coherent and Intact  Thoughts: normal  Mental Status: Orientation: oriented to person, place and time/date Mood & Affect: anxiety Attention Span & Concentration:  fair   Medical Decision Making (Choose Three): Established Problem, Stable/Improving  (1), Review of Psycho-Social Stressors (1), Review or order clinical lab tests (1), Review and summation of old records (2), Review of Last Therapy Session (1) and Review of Medication Regimen & Side Effects (2)  Assessment: Axis I: Maj. depressive disorder, recurrent  Axis II: deferred  Axis III:  Patient Active Problem List   Diagnosis Date Noted  . Apnea, sleep 06/21/2012  . Obesity (BMI 30.0-34.9) 04/04/2012  . Bipolar disorder 04/03/2012  . Hx of nicotine dependence 11/14/2011  . Routine general medical examination at a health care facility 11/14/2011  . Anterior cervical adenopathy 07/27/2011  . Foot dermatitis 07/22/2011  . High risk for colon cancer 03/15/2011  . Leukocytosis 10/06/2010  . Eczema 08/05/2010  . HSV 08/10/2009  . ECHOCARDIOGRAM, ABNORMAL 01/11/2008  . ANKLE PAIN, CHRONIC 12/28/2007  . Leukocytosis, unspecified 09/11/2007  . HEADACHE, TENSION 07/17/2007  . HYPERGLYCEMIA 02/13/2007  . CROHN'S DISEASE 05/15/2006  . ANEMIA, B12 DEFICIENCY 12/29/2005  . ANXIETY 12/29/2005  . DEPRESSION 12/29/2005  . ALLERGIC RHINITIS 12/29/2005  . ASTHMA 12/29/2005  . GERD 12/29/2005  . PEPTIC  ULCER DISEASE 12/29/2005  . ARTHRITIS, GENERALIZED 12/29/2005  . HERNIORRHAPHY, HX OF 12/29/2005    Axis IV: mild to moderate  Axis V:  55-60   Plan:. Patient will continue Wellbutrin XL 150 mg twice a day. She will requests restarting counseling I think this is a good idea.  I recommend to call us back if she is any question of conservatively worsening of the symptom.  Otherwise I will see her in 3 months.  Time spent 25 minutes.  More than 50% of the time spent and psychoeducation, counseling and coordination of care.    Levonne Spiller, MD 11/23/2012

## 2012-12-03 ENCOUNTER — Other Ambulatory Visit (HOSPITAL_COMMUNITY): Payer: Self-pay

## 2012-12-03 DIAGNOSIS — G4730 Sleep apnea, unspecified: Secondary | ICD-10-CM

## 2012-12-04 ENCOUNTER — Ambulatory Visit (HOSPITAL_COMMUNITY): Payer: Self-pay | Admitting: Psychology

## 2012-12-05 ENCOUNTER — Ambulatory Visit: Payer: Medicare Other | Attending: Neurology | Admitting: Sleep Medicine

## 2012-12-05 DIAGNOSIS — G4700 Insomnia, unspecified: Secondary | ICD-10-CM

## 2012-12-05 DIAGNOSIS — G4733 Obstructive sleep apnea (adult) (pediatric): Secondary | ICD-10-CM | POA: Insufficient documentation

## 2012-12-05 DIAGNOSIS — G4730 Sleep apnea, unspecified: Secondary | ICD-10-CM

## 2012-12-06 ENCOUNTER — Ambulatory Visit (INDEPENDENT_AMBULATORY_CARE_PROVIDER_SITE_OTHER): Payer: BC Managed Care – PPO | Admitting: Psychology

## 2012-12-06 DIAGNOSIS — F329 Major depressive disorder, single episode, unspecified: Secondary | ICD-10-CM

## 2012-12-06 DIAGNOSIS — F411 Generalized anxiety disorder: Secondary | ICD-10-CM

## 2012-12-08 NOTE — Procedures (Signed)
Santel A. Merlene Laughter, MD     [email or web address]        NAMEXARIA, JUDON                ACCOUNT NO.:  [id number]  MEDICAL RECORD NO.:  [id number]          PATIENT TYPE:  OUT  LOCATION:  SLEEP LAB                     FACILITY:  [facility]  PHYSICIAN:  Shanaia Sievers A. Merlene Laughter, M.D. DATE OF BIRTH:  05/14/1970  DATE OF STUDY:  12/05/2012                           NOCTURNAL POLYSOMNOGRAM  REFERRING PHYSICIAN:  Shadrack Brummitt A. Merlene Laughter, M.D.  INDICATIONS:  A 42 year old who had a previous study diagnosis of obstructive sleep apnea syndrome.  This is a CPAP titration recording.   MEDICATIONS:  Omeprazole, Humira, vitamin B12.  Potassium.  EPWORTH SLEEPINESS SCALE:  8.  BMI:  31.  ARCHITECTURAL SUMMARY:  The total recording time is 366, sleep efficiency 83%, sleep latency 6 minutes, REM latency 147 minutes.  Stage N1 9%, N2 61%, N3 3%, and REM sleep 26%.  RESPIRATORY SUMMARY:  Baseline oxygen saturation is 98, lowest saturation 92 during non REM sleep.  The patient placed in positive pressure starting at 5 and increased to 9, optimal pressure is 9 with resolution of obstructive events and good tolerance.  LIMB MOVEMENT SUMMARY:  PLM index 0.  ELECTROCARDIOGRAM SUMMARY:  Average heart rate is 81 with no significant dysrhythmias observed.  IMPRESSION:  Obstructive sleep apnea syndrome, responded well to a CPAP of 9.      Jhordyn Hoopingarner A. Merlene Laughter, M.D.    KAD/MEDQ  D:  12/08/2012 15:40:12  T:  12/08/2012 16:02:11  Job:  [id number]

## 2012-12-13 ENCOUNTER — Telehealth: Payer: Self-pay | Admitting: Family Medicine

## 2012-12-13 NOTE — Telephone Encounter (Signed)
RPC refers to Dr Merlene Laughter for eval and treatment of sleep apnea

## 2012-12-13 NOTE — Telephone Encounter (Signed)
This was with [place], correct?

## 2012-12-14 NOTE — Telephone Encounter (Signed)
[name] stated that she has everything she needs for this

## 2013-01-07 ENCOUNTER — Ambulatory Visit (HOSPITAL_COMMUNITY): Payer: Self-pay | Admitting: Psychology

## 2013-01-14 ENCOUNTER — Other Ambulatory Visit: Payer: Self-pay | Admitting: Family Medicine

## 2013-01-22 ENCOUNTER — Encounter: Payer: Self-pay | Admitting: Family Medicine

## 2013-01-22 ENCOUNTER — Encounter: Payer: Self-pay | Admitting: General Practice

## 2013-01-22 ENCOUNTER — Other Ambulatory Visit: Payer: Self-pay | Admitting: Gastroenterology

## 2013-02-04 ENCOUNTER — Encounter: Payer: Self-pay | Admitting: Family Medicine

## 2013-02-04 ENCOUNTER — Ambulatory Visit (INDEPENDENT_AMBULATORY_CARE_PROVIDER_SITE_OTHER): Payer: Medicare Other | Admitting: Family Medicine

## 2013-02-04 VITALS — BP 114/70 | HR 78 | Resp 18 | Ht 67.0 in | Wt 199.1 lb

## 2013-02-04 DIAGNOSIS — J309 Allergic rhinitis, unspecified: Secondary | ICD-10-CM

## 2013-02-04 DIAGNOSIS — G4730 Sleep apnea, unspecified: Secondary | ICD-10-CM

## 2013-02-04 DIAGNOSIS — B009 Herpesviral infection, unspecified: Secondary | ICD-10-CM

## 2013-02-04 MED ORDER — PREDNISONE (PAK) 5 MG PO TABS: 5.0000 mg | ORAL_TABLET | ORAL | Status: DC

## 2013-02-04 MED ORDER — METHYLPREDNISOLONE ACETATE 80 MG/ML IJ SUSP
80.0000 mg | Freq: Once | INTRAMUSCULAR | Status: AC
  Administered 2013-02-04: 80 mg via INTRAMUSCULAR

## 2013-02-04 NOTE — Progress Notes (Signed)
   Subjective:    Patient ID: [name], female    DOB: [birth year], 42 y.o.   MRN: [id number]  HPI Pt notes that since she started the CPAP machine has bloody sputum and increased and increased nasal stuffiness, denies fever or chills, denies generalized aches and pains States recently noted some white spots on her tongue which have since resolved. Has past h/o fver blisters , but noone in recent times C/o increased head congestion and pressure with uncontrolled allergy symptoms in the past 10 days   Review of Systems See HPI  throat. Denies chest congestion, productive cough or wheezing. Denies chest pains, palpitations and leg swelling Denies abdominal pain, nausea, vomiting,diarrhea or constipation.   Denies dysuria, frequency, hesitancy or incontinence. Denies joint pain, swelling and limitation in mobility. Denies headaches, seizures, numbness, or tingling. Denies uncontrolled  depression, anxiety or insomnia. Denies skin break down or rash.        Objective:   Physical Exam  Patient alert and oriented and in no cardiopulmonary distress.  HEENT: No facial asymmetry, EOMI, no sinus tenderness,  oropharynx pink and moist.  Neck supple no adenopathy.Nasal mucosa erythematous and edematous with marked narrowing of right nasal passage  Chest: Clear to auscultation bilaterally.  CVS: S1, S2 no murmurs, no S3.  ABD: Soft non tender. Bowel sounds normal.  Ext: No edema  MS: Adequate ROM spine, shoulders, hips and knees.  Skin: Intact, no ulcerations or rash noted.  Psych: Good eye contact, normal affect. Memory intact not anxious or depressed appearing.  CNS: CN 2-12 intact, power, tone and sensation normal throughout.       Assessment & Plan:

## 2013-02-04 NOTE — Patient Instructions (Addendum)
F/u in 3 to 4 month, call if you need me before  You have obstruction in right nostril which  is red and swollen  Depo medrol 52m Im in the office today , and start prednisone dose pack  (tablets) which are sent to your pharmacy  You are referred to ENT for further management also

## 2013-02-10 NOTE — Assessment & Plan Note (Signed)
Uncontrolled , with compromise of right nasal passage. Depo medrol in office followed by oral prednisone

## 2013-02-10 NOTE — Assessment & Plan Note (Signed)
Recently started use of CPAP with some new upper airway symptoms, will discuss with neurologist at f/u

## 2013-02-10 NOTE — Assessment & Plan Note (Signed)
No current flare of rash

## 2013-02-12 ENCOUNTER — Encounter: Payer: Self-pay | Admitting: Internal Medicine

## 2013-02-12 ENCOUNTER — Telehealth: Payer: Self-pay | Admitting: Internal Medicine

## 2013-02-12 ENCOUNTER — Encounter: Payer: Self-pay | Admitting: Gastroenterology

## 2013-02-12 NOTE — Telephone Encounter (Signed)
I called the patient concerning my chart activation.

## 2013-02-12 NOTE — Telephone Encounter (Signed)
Pt called this morning. She said that she had a missed call and didn't know from who. Did you try calling?

## 2013-02-14 ENCOUNTER — Ambulatory Visit (INDEPENDENT_AMBULATORY_CARE_PROVIDER_SITE_OTHER): Payer: BLUE CROSS/BLUE SHIELD | Admitting: Psychology

## 2013-02-14 DIAGNOSIS — F329 Major depressive disorder, single episode, unspecified: Secondary | ICD-10-CM

## 2013-02-14 DIAGNOSIS — F411 Generalized anxiety disorder: Secondary | ICD-10-CM

## 2013-02-15 ENCOUNTER — Encounter (HOSPITAL_COMMUNITY): Payer: Self-pay | Admitting: Psychiatry

## 2013-02-15 ENCOUNTER — Ambulatory Visit (INDEPENDENT_AMBULATORY_CARE_PROVIDER_SITE_OTHER): Payer: BC Managed Care – PPO | Admitting: Psychiatry

## 2013-02-15 VITALS — BP 130/80 | Ht 67.0 in | Wt 200.0 lb

## 2013-02-15 DIAGNOSIS — F339 Major depressive disorder, recurrent, unspecified: Secondary | ICD-10-CM

## 2013-02-15 DIAGNOSIS — F329 Major depressive disorder, single episode, unspecified: Secondary | ICD-10-CM

## 2013-02-15 MED ORDER — BUPROPION HCL ER (XL) 150 MG PO TB24: 150.0000 mg | ORAL_TABLET | Freq: Two times a day (BID) | ORAL | Status: DC

## 2013-02-15 NOTE — Progress Notes (Signed)
Patient ID: [name], female   DOB: 1971-01-23, 42 y.o.   MRN: [id number] Patient ID: [name], female   DOB: [birth year], 42 y.o.   MRN: [id number]  [facility] Progress Note  [name] [id number] 42 y.o.  02/15/2013 3:51 PM  Chief Complaint: "I'm doing well ."  History of Present Illness:  This patient is a 42 year old black female who is married and lives with her 3 children and husband in [place]. She is on disability for Crohn's disease.  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 She got severely constipated with a high fever . Once it was determined what was wrong she's had an up-and-down course with her Crohn's. Currently is under good control because she has figured out which foods make it worse. The patient got depressed after developing Crohn's and was hospitalized twice at behavioral health hospital in the past. this was complicated by the fact that she was abusing marijuana.  The patient returns after 3 months. She is doing well. Her Crohn's is under good control. She quit smoking last year and this is made a huge difference in her general health. Her mood has been good and she is not depressed or suicidal. She had a conflict with a coworker yesterday where she volunteers at a daycare. She is going to take a break from the volunteer work for a while.     Suicidal Ideation: No Plan Formed: No Patient has means to carry out plan: No  Homicidal Ideation: No Plan Formed: No Patient has means to carry out plan: No  Review of Systems: Psychiatric: Agitation: No Hallucination: No Depressed Mood: No Insomnia: Yes Hypersomnia: No Altered Concentration: No Feels Worthless: No Grandiose Ideas: No Belief In Special Powers: No New/Increased Substance Abuse: No Compulsions: No  Neurologic: Headache: Yes Seizure: No Paresthesias: No  Social History: Patient lives with her husband and children.  She  has one son and 2 daughter.  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  She likes reading listing music walking and fishing.  Medical history Patient has multiple medical issues.  She has asthma, Crohn's disease, acid reflux, headaches, anemia, sleep apnea and arthritis.  Her primary care physician is Dr. Moshe Cipro.  Her neurologist is Dr Elpidio Galea and her gastroenterologist is Dr. Quincy Sheehan.   Alcohol and substance use history. Patient admitted using marijuana but claims to be sober since 2012. Outpatient Encounter Prescriptions as of 02/15/2013  Medication Sig  . adalimumab (HUMIRA) 40 MG/0.8ML injection Inject 0.8 mLs (40 mg total) into the skin every 14 (fourteen) days.  Marland Kitchen albuterol (PROVENTIL HFA;VENTOLIN HFA) 108 (90 BASE) MCG/ACT inhaler Inhale 2 puffs into the lungs every 6 (six) hours as needed for wheezing.  Marland Kitchen buPROPion (WELLBUTRIN XL) 150 MG 24 hr tablet Take 1 tablet (150 mg total) by mouth 2 (two) times daily.  . calcium-vitamin D (OSCAL) 250-125 MG-UNIT per tablet Take 1 tablet by mouth daily.    . Cyanocobalamin (B-12) 1000 MCG CAPS Take 1 capsule by mouth daily.    . diphenhydrAMINE (BENADRYL) 25 mg capsule Take 25 mg by mouth daily as needed. For allergies   . fish oil-omega-3 fatty acids 1000 MG capsule Take 3 g by mouth daily.   . Liniments (SALONPAS ARTHRITIS PAIN RELIEF) PADS Apply 1 each topically every 8 (eight) hours as needed. pain  . Multiple Vitamin (MULTIVITAMIN) tablet Take 1 tablet by mouth daily.  Marland Kitchen omeprazole (PRILOSEC) 20 MG capsule Take 1 capsule (  20 mg total) by mouth 2 (two) times daily before a meal.  . potassium chloride SA (K-DUR,KLOR-CON) 20 MEQ tablet TAKE ONE TABLET TWICE DAILY  . predniSONE (STERAPRED UNI-PAK) 5 MG TABS tablet Take 1 tablet (5 mg total) by mouth as directed.  . valACYclovir (VALTREX) 1000 MG tablet Take 2 tablets (2,000 mg total) by mouth every 12 (twelve) hours. For 1 day.  . [DISCONTINUED] buPROPion (WELLBUTRIN XL) 150 MG 24 hr tablet Take 1 tablet (150  mg total) by mouth 2 (two) times daily.    Past Psychiatric History/Hospitalization(s): Patient has multiple psychiatric hospitalizations in the past.  Her last psychiatric hospitalization in 2005 but she also admitted hospitalization in 2008 which we do not have details.  She has history of taking overdose on her medication .  She has history of depression and she was also diagnosed with bipolar disorder.  In the past she has taken Seroquel, Risperdal, Celexa, Lexapro, and Xanax.  She has seen psychologist in the past.  Anxiety: No Bipolar Disorder: Yes Depression: Yes Mania: Yes Psychosis: No Schizophrenia: No Personality Disorder: No Hospitalization for psychiatric illness: Yes History of Electroconvulsive Shock Therapy: No Prior Suicide Attempts: No  Physical Exam: Constitutional:  BP 130/80  Ht 5' 7"  (1.702 m)  Wt 200 lb (90.719 kg)  BMI 31.32 kg/m2  General Appearance: alert, oriented, no acute distress and well nourished  Musculoskeletal: Strength & Muscle Tone: within normal limits Gait & Station: normal Patient leans: N/A  Psychiatric: Speech (describe rate, volume, coherence, spontaneity, and abnormalities if any):  clear and coherent normal tone and volume.    Thought Process (describe rate, content, abstract reasoning, and computation):logical and goal-directed.    Associations: Coherent and Intact  Thoughts: normal  Mental Status: Orientation: oriented to person, place and time/date Mood & Affect: anxiety Attention Span & Concentration:  fair   Medical Decision Making (Choose Three): Established Problem, Stable/Improving (1), Review of Psycho-Social Stressors (1), Review or order clinical lab tests (1), Review and summation of old records (2), Review of Last Therapy Session (1) and Review of Medication Regimen & Side Effects (2)  Assessment: Axis I: Maj. depressive disorder, recurrent  Axis II: deferred  Axis III:  Patient Active Problem List    Diagnosis Date Noted  . Apnea, sleep 06/21/2012  . Obesity (BMI 30.0-34.9) 04/04/2012  . Bipolar disorder 04/03/2012  . Hx of nicotine dependence 11/14/2011  . Routine general medical examination at a health care facility 11/14/2011  . Anterior cervical adenopathy 07/27/2011  . Foot dermatitis 07/22/2011  . High risk for colon cancer 03/15/2011  . Leukocytosis 10/06/2010  . Eczema 08/05/2010  . HSV 08/10/2009  . ECHOCARDIOGRAM, ABNORMAL 01/11/2008  . ANKLE PAIN, CHRONIC 12/28/2007  . Leukocytosis, unspecified 09/11/2007  . HEADACHE, TENSION 07/17/2007  . HYPERGLYCEMIA 02/13/2007  . CROHN'S DISEASE 05/15/2006  . ANEMIA, B12 DEFICIENCY 12/29/2005  . ANXIETY 12/29/2005  . DEPRESSION 12/29/2005  . ALLERGIC RHINITIS 12/29/2005  . ASTHMA 12/29/2005  . GERD 12/29/2005  . PEPTIC ULCER DISEASE 12/29/2005  . ARTHRITIS, GENERALIZED 12/29/2005  . HERNIORRHAPHY, HX OF 12/29/2005    Axis IV: mild to moderate  Axis V:  55-60   Plan:. Patient will continue Wellbutrin XL 150 mg twice a day. I recommend to call us back if she is any question of conservatively worsening of the symptom.  Otherwise I will see her in 3 months.  Time spent 25 minutes.  More than 50% of the time spent and psychoeducation, counseling and coordination of care.  Levonne Spiller, MD 02/15/2013

## 2013-02-18 ENCOUNTER — Encounter (HOSPITAL_COMMUNITY): Payer: Self-pay | Admitting: Psychology

## 2013-02-18 NOTE — Progress Notes (Signed)
Patient:  [name]   DOB: 08/22/1970  MR Number: [id number]  Location: [facility] [address] [place] [address] Dept: [phone]  Start: 3 PM End: 4 PM  Provider/Observer:     Edgardo Roys PSYD  Chief Complaint:      Chief Complaint  Patient presents with  . Depression  . Anxiety    Reason For Service:     The patient was seen in our office for many years and I last saw her in 2009. She was referred to our office because of significant symptoms of depression and sleep disturbance. She has had some inpatient hospitalizations and followup there are office. The patient has been diagnosed with Crohn's disease or reports at the present time that it is been very well managed and is quite pleased with this issue. She has dealt with recurrent major depressive episodes for years.  Interventions Strategy:  Cognitive/behavioral psychotherapeutic interventions  Participation Level:   Active  Participation Quality:  Appropriate      Behavioral Observation:  Well Groomed, Alert, and Appropriate.   Current Psychosocial Factors: The patient reports that he continues to be a lot of stress and this time it has been more focused on issues related to stress the church. This is impacting her family function as well..  Content of Session:   Review current symptoms and continue to work on therapeutic coping skills are in issues of stress related to family dysfunction.  Current Status:   The patient reports that her Crohn's disease has improved significantly and she is not suffering from these symptoms like she had in the past. However, she has been experiencing recurrent over depression with family stressors.  Patient Progress:   Stable  Target Goals:   Target goals include improving coping skills and strategies around this family issues that she is having to cope with a regular basis. Also  reducing the intensity, severity, and duration of major depressive events.  Last Reviewed:   02/14/2013  Goals Addressed Today:    Today we worked on coping skills are in issues of dealing with dysfunctional family members as well as issues of recurrent depression.  Impression/Diagnosis:   The patient has a long history of recurrent major depressive events as well as some anxiety and stress. She has been doing well over the past several years according to the patient and following the treatment recommendations. However, her depression has increased recently apparently do to family stressors.  Diagnosis:    Axis I: MDD (major depressive disorder)  Anxiety state, unspecified

## 2013-02-18 NOTE — Progress Notes (Signed)
Patient:  [name]   DOB: 03-17-1970  MR Number: [id number]  Location: [facility] [address] [place] [address] Dept: [phone]  Start: 9 AM End: 10 AM  Provider/Observer:     Edgardo Roys PSYD  Chief Complaint:      Chief Complaint  Patient presents with  . Depression    Reason For Service:     The patient was seen in our office for many years and I last saw her in 2009. She was referred to our office because of significant symptoms of depression and sleep disturbance. She has had some inpatient hospitalizations and followup there are office. The patient has been diagnosed with Crohn's disease or reports at the present time that it is been very well managed and is quite pleased with this issue. She has dealt with recurrent major depressive episodes for years.  Interventions Strategy:  Cognitive/behavioral psychotherapeutic interventions  Participation Level:   Active  Participation Quality:  Appropriate      Behavioral Observation:  Well Groomed, Alert, and Appropriate.   Current Psychosocial Factors: The patient reports that she has been having a lot of family stressors lately and this is the main reason for coming back for therapeutic interventions. She reports that her Crohn's disease has been much improved since I saw her last about 4 years ago.  Content of Session:   Review current symptoms and continue to work on therapeutic coping skills are in issues of stress related to family dysfunction.  Current Status:   The patient reports that her Crohn's disease has improved significantly and she is not suffering from these symptoms like she had in the past. However, she has been experiencing recurrent over depression with family stressors.  Patient Progress:   Stable  Target Goals:   Target goals include improving coping skills and strategies around this family issues that  she is having to cope with a regular basis. Also reducing the intensity, severity, and duration of major depressive events.  Last Reviewed:   12/06/2012  Goals Addressed Today:    Today we worked on coping skills are in issues of dealing with dysfunctional family members as well as issues of recurrent depression.  Impression/Diagnosis:   The patient has a long history of recurrent major depressive events as well as some anxiety and stress. She has been doing well over the past several years according to the patient and following the treatment recommendations. However, her depression has increased recently apparently do to family stressors.  Diagnosis:    Axis I: MDD (major depressive disorder)  Anxiety state, unspecified

## 2013-02-22 ENCOUNTER — Ambulatory Visit (HOSPITAL_COMMUNITY): Payer: Self-pay | Admitting: Psychiatry

## 2013-03-12 ENCOUNTER — Other Ambulatory Visit: Payer: Self-pay | Admitting: Family Medicine

## 2013-03-13 ENCOUNTER — Encounter: Payer: Self-pay | Admitting: Family Medicine

## 2013-03-14 ENCOUNTER — Ambulatory Visit (INDEPENDENT_AMBULATORY_CARE_PROVIDER_SITE_OTHER): Payer: Medicare Other | Admitting: Otolaryngology

## 2013-03-14 DIAGNOSIS — J343 Hypertrophy of nasal turbinates: Secondary | ICD-10-CM | POA: Diagnosis not present

## 2013-03-14 DIAGNOSIS — J310 Chronic rhinitis: Secondary | ICD-10-CM

## 2013-03-20 ENCOUNTER — Ambulatory Visit: Payer: Self-pay | Admitting: Internal Medicine

## 2013-04-02 ENCOUNTER — Other Ambulatory Visit: Payer: Self-pay

## 2013-04-02 MED ORDER — ADALIMUMAB 40 MG/0.8ML ~~LOC~~ KIT: 40.0000 mg | PACK | SUBCUTANEOUS | Status: DC

## 2013-04-04 ENCOUNTER — Encounter: Payer: Self-pay | Admitting: Internal Medicine

## 2013-04-04 ENCOUNTER — Other Ambulatory Visit: Payer: Self-pay | Admitting: Gastroenterology

## 2013-04-04 ENCOUNTER — Other Ambulatory Visit: Payer: Self-pay

## 2013-04-04 ENCOUNTER — Telehealth: Payer: Self-pay | Admitting: Internal Medicine

## 2013-04-04 DIAGNOSIS — R197 Diarrhea, unspecified: Secondary | ICD-10-CM

## 2013-04-04 MED ORDER — BUDESONIDE 3 MG PO CP24: 9.0000 mg | ORAL_CAPSULE | Freq: Every day | ORAL | Status: DC

## 2013-04-04 NOTE — Telephone Encounter (Signed)
Please see where I have converted the mychart message to a phone note. Check Cdiff PCR, start entocort, avoid NSAIDs. Further details on phone note.

## 2013-04-04 NOTE — Telephone Encounter (Signed)
I have sent pt a message in mychart.

## 2013-04-04 NOTE — Telephone Encounter (Signed)
I spoke with pt, finally got her to stop yelling and crying, explained to her that switching pharmacies was not going to help the situation. Informed her that a prior authorization has been done, [facility] and we are still waiting to hear from her insurance company to see if they were going to pay for it. Promised her that I would call her as soon as I heard from them.

## 2013-04-04 NOTE — Telephone Encounter (Signed)
Has she had any exposure to abx? Any recent sick contacts? Likely having mild flare secondary to lack of medication. Let's go ahead and check Cdiff PCR to be thorough. Make sure she is avoiding NSAIDs.   I am sending Entocort to her pharmacy. 9 mg daily X 6 weeks with taper thereafter. Needs to seek medical attention if worsening of symptoms despite this.

## 2013-04-04 NOTE — Telephone Encounter (Signed)
Pt called wanting [place] to transfer her Humira prescription elsewhere because it was taking too long for her to get it. I told her that JL was working on a PA through her insurance and before I could say anything else patient is yelling and crying on the phone that no one cares about her, she is hurting, she needs her medicine and I could not get a word in. I transferred call to JL.

## 2013-04-09 ENCOUNTER — Telehealth: Payer: Self-pay | Admitting: *Deleted

## 2013-04-09 MED ORDER — ADALIMUMAB 40 MG/0.8ML ~~LOC~~ KIT: 40.0000 mg | PACK | SUBCUTANEOUS | Status: DC
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
## 2013-04-09 NOTE — Telephone Encounter (Signed)
Completed. How is patient doing? I had placed her on Uceris due to possible flare.

## 2013-04-09 NOTE — Telephone Encounter (Signed)
Routing to refill box. Pts insurance has approved humira.

## 2013-04-10 ENCOUNTER — Other Ambulatory Visit: Payer: Self-pay | Admitting: Family Medicine

## 2013-04-10 NOTE — Telephone Encounter (Signed)
Noted  

## 2013-04-10 NOTE — Telephone Encounter (Signed)
Tried to call pt- LMOM 

## 2013-04-10 NOTE — Telephone Encounter (Signed)
Spoke with pt- she stated she is doing great now. She was able to get her humira this morning.

## 2013-04-12 ENCOUNTER — Ambulatory Visit (HOSPITAL_COMMUNITY): Payer: Self-pay | Admitting: Psychology

## 2013-04-16 ENCOUNTER — Encounter: Payer: Self-pay | Admitting: Family Medicine

## 2013-04-17 ENCOUNTER — Ambulatory Visit: Payer: Self-pay | Admitting: Family Medicine

## 2013-04-18 ENCOUNTER — Encounter: Payer: Self-pay | Admitting: Family Medicine

## 2013-04-18 ENCOUNTER — Ambulatory Visit (INDEPENDENT_AMBULATORY_CARE_PROVIDER_SITE_OTHER): Payer: Medicare Other | Admitting: Family Medicine

## 2013-04-18 VITALS — BP 120/80 | HR 90 | Resp 16 | Ht 67.0 in | Wt 204.0 lb

## 2013-04-18 DIAGNOSIS — F3289 Other specified depressive episodes: Secondary | ICD-10-CM

## 2013-04-18 DIAGNOSIS — J309 Allergic rhinitis, unspecified: Secondary | ICD-10-CM | POA: Diagnosis not present

## 2013-04-18 DIAGNOSIS — J0100 Acute maxillary sinusitis, unspecified: Secondary | ICD-10-CM

## 2013-04-18 DIAGNOSIS — F329 Major depressive disorder, single episode, unspecified: Secondary | ICD-10-CM

## 2013-04-18 DIAGNOSIS — R7301 Impaired fasting glucose: Secondary | ICD-10-CM

## 2013-04-18 DIAGNOSIS — H1030 Unspecified acute conjunctivitis, unspecified eye: Secondary | ICD-10-CM

## 2013-04-18 DIAGNOSIS — K5090 Crohn's disease, unspecified, without complications: Secondary | ICD-10-CM

## 2013-04-18 DIAGNOSIS — R5381 Other malaise: Secondary | ICD-10-CM

## 2013-04-18 DIAGNOSIS — Z79899 Other long term (current) drug therapy: Secondary | ICD-10-CM | POA: Diagnosis not present

## 2013-04-18 DIAGNOSIS — R5383 Other fatigue: Secondary | ICD-10-CM

## 2013-04-18 DIAGNOSIS — H1033 Unspecified acute conjunctivitis, bilateral: Secondary | ICD-10-CM

## 2013-04-18 MED ORDER — LEVOFLOXACIN 500 MG PO TABS: 500.0000 mg | ORAL_TABLET | Freq: Every day | ORAL | Status: DC

## 2013-04-18 MED ORDER — FLUTICASONE PROPIONATE 50 MCG/ACT NA SUSP: 2.0000 | Freq: Every day | NASAL | Status: DC

## 2013-04-18 MED ORDER — FLUCONAZOLE 150 MG PO TABS: 150.0000 mg | ORAL_TABLET | Freq: Once | ORAL | Status: DC

## 2013-04-18 MED ORDER — LORATADINE 10 MG PO TABS: 10.0000 mg | ORAL_TABLET | Freq: Every day | ORAL | Status: DC

## 2013-04-18 MED ORDER — CIPROFLOXACIN HCL 0.3 % OP SOLN: OPHTHALMIC | Status: DC

## 2013-04-18 NOTE — Patient Instructions (Signed)
F/U in August 13 or after, call if you need me before  You are being treated for acute maxillary sinusitis, conjunctivitis and uncontrolled allergies. Medication is prescribed  It is important that you exercise regularly at least 30 minutes 7 times a week. If you develop chest pain, have severe difficulty breathing, or feel very tired, stop exercising immediately and seek medical attention   Weight loss goal of 10 pounds   A healthy diet is rich in fruit, vegetables and whole grains. Poultry fish, nuts and beans are a healthy choice for protein rather then red meat. A low sodium diet and drinking 64 ounces of water daily is generally recommended. Oils and sweet should be limited. Carbohydrates especially for those who are diabetic or overweight, should be limited to 45 to 60 gram per meal. It is important to eat on a regular schedule, at least 3 times daily. Snacks should be primarily fruits, vegetables or nuts.  Adequate rest, generally 6 to 8 hours per night is important for good health.Good sleep hygiene involves setting a regular bedtime, and turning off all sound and light in your sleep environment.Limiting caffeine intake will also help with the ability to rest well  Lab work needs to be done 3 to 5 days before your follow up visit please.CBC, lipid, hBA1C, chem 7, TSH  All medications need to be brought to every visit

## 2013-04-18 NOTE — Progress Notes (Signed)
   Subjective:    Patient ID: [name], female    DOB: 05-17-1970, 43 y.o.   MRN: [id number]  HPI Puffy , watery red eyes since Saturday, no change in vision, watery and thick drainage from the eyes. Last week right ear was stopped up, also yellow green nasal drainage No productive cough, no sore throat or body aches, no fever, or chills States she has had uncontrolled allergies in the past several weeks, with excessive watery nasal drainage and sneezing    Review of Systems See HPI  Denies chest congestion, productive cough or wheezing. Denies chest pains, palpitations and leg swelling Denies abdominal pain, nausea, vomiting,diarrhea or constipation.   Denies dysuria, frequency, hesitancy or incontinence. Denies joint pain, swelling and limitation in mobility. Denies headaches, seizures, numbness, or tingling. Denies uncontrolled  depression, anxiety or insomnia. Denies skin break down or rash.         Objective:   Physical Exam  Patient alert and oriented and in no cardiopulmonary distress.  HEENT: No facial asymmetry, EOMI, maxillary  sinus tenderness,  oropharynx pink and moist.  Neck supple no adenopathy.TM clear bilaterally Periorbital edema bilaterally, erythematous conjunctiva, and thick drainage from both eyes  Chest: Clear to auscultation bilaterally.  CVS: S1, S2 no murmurs, no S3.  ABD: Soft non tender. Bowel sounds normal.  Ext: No edema  MS: Adequate ROM spine, shoulders, hips and knees.  Skin: Intact, no ulcerations or rash noted.  Psych: Good eye contact, normal affect. Memory intact not anxious or depressed appearing.  CNS: CN 2-12 intact, power, tone and sensation normal throughout.       Assessment & Plan:  Acute maxillary sinusitis Antibiotic prescribed, pt also advised to flush with saline regularly  Conjunctivitis, acute, bilateral Topical antibiotic prescribed  ALLERGIC RHINITIS Uncontrolled, short pulse of steroids, then daily  allergy meds  CROHN'S DISEASE Stable followed by gI regularly, no recent flare  DEPRESSION Controlled , see therapist regularly

## 2013-04-19 ENCOUNTER — Ambulatory Visit (INDEPENDENT_AMBULATORY_CARE_PROVIDER_SITE_OTHER): Payer: BC Managed Care – PPO | Admitting: Internal Medicine

## 2013-04-19 ENCOUNTER — Encounter: Payer: Self-pay | Admitting: Internal Medicine

## 2013-04-19 VITALS — BP 120/78 | HR 93 | Temp 97.6°F | Wt 204.4 lb

## 2013-04-19 DIAGNOSIS — K5090 Crohn's disease, unspecified, without complications: Secondary | ICD-10-CM | POA: Diagnosis not present
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
## 2013-04-19 NOTE — Patient Instructions (Signed)
Continue Humira  Continue probiotic  Office visit in 6 months

## 2013-04-22 ENCOUNTER — Telehealth (HOSPITAL_COMMUNITY): Payer: Self-pay | Admitting: *Deleted

## 2013-04-22 NOTE — Assessment & Plan Note (Signed)
Stable followed by gI regularly, no recent flare

## 2013-04-22 NOTE — Assessment & Plan Note (Signed)
Topical antibiotic prescribed

## 2013-04-22 NOTE — Assessment & Plan Note (Signed)
Controlled , see therapist regularly

## 2013-04-22 NOTE — Assessment & Plan Note (Signed)
Uncontrolled, short pulse of steroids, then daily allergy meds

## 2013-04-22 NOTE — Assessment & Plan Note (Signed)
Antibiotic prescribed, pt also advised to flush with saline regularly

## 2013-04-23 ENCOUNTER — Ambulatory Visit (HOSPITAL_COMMUNITY): Payer: Self-pay | Admitting: Psychology

## 2013-05-13 ENCOUNTER — Encounter (HOSPITAL_COMMUNITY): Payer: Self-pay | Admitting: Psychiatry

## 2013-05-13 ENCOUNTER — Telehealth (HOSPITAL_COMMUNITY): Payer: Self-pay | Admitting: *Deleted

## 2013-05-13 ENCOUNTER — Ambulatory Visit (INDEPENDENT_AMBULATORY_CARE_PROVIDER_SITE_OTHER): Payer: PRIVATE HEALTH INSURANCE | Admitting: Psychiatry

## 2013-05-13 VITALS — BP 110/72 | Ht 67.0 in | Wt 207.0 lb

## 2013-05-13 DIAGNOSIS — F329 Major depressive disorder, single episode, unspecified: Secondary | ICD-10-CM

## 2013-05-13 DIAGNOSIS — F339 Major depressive disorder, recurrent, unspecified: Secondary | ICD-10-CM

## 2013-05-13 MED ORDER — BUPROPION HCL ER (XL) 150 MG PO TB24: 150.0000 mg | ORAL_TABLET | Freq: Two times a day (BID) | ORAL | Status: DC

## 2013-05-13 NOTE — Telephone Encounter (Signed)
Should be SR form

## 2013-05-13 NOTE — Progress Notes (Signed)
Patient ID: [name], female   DOB: 21-Jun-1970, 43 y.o.   MRN: [id number] Patient ID: [name], female   DOB: 25-Feb-1971, 43 y.o.   MRN: [id number] Patient ID: [name], female   DOB: 1970/08/26, 43 y.o.   MRN: [id number]  [facility] Progress Note  [name] [id number] 43 y.o.  05/13/2013 10:13 AM  Chief Complaint: "I'm doing well ."  History of Present Illness:  This patient is a 43 year old black female who is married and lives with her 3 children and husband in [place]. She is on disability for Crohn's disease.  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 She got severely constipated with a high fever . Once it was determined what was wrong she's had an up-and-down course with her Crohn's. Currently is under good control because she has figured out which foods make it worse. The patient got depressed after developing Crohn's and was hospitalized twice at behavioral health hospital in the past. this was complicated by the fact that she was abusing marijuana.  The patient returns after 3 months. She is doing well. Her Crohn's is under good control. She quit smoking last year and this is made a huge difference in her general health. Her mood has been good and she is not depressed or suicidal. She is very upbeat today.     Suicidal Ideation: No Plan Formed: No Patient has means to carry out plan: No  Homicidal Ideation: No Plan Formed: No Patient has means to carry out plan: No  Review of Systems: Psychiatric: Agitation: No Hallucination: No Depressed Mood: No Insomnia: Yes Hypersomnia: No Altered Concentration: No Feels Worthless: No Grandiose Ideas: No Belief In Special Powers: No New/Increased Substance Abuse: No Compulsions: No  Neurologic: Headache: Yes Seizure: No Paresthesias: No  Social History: Patient lives with her husband and children.  She has one son and 2 daughter.  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  She likes reading listing music walking and fishing.  Medical history Patient has multiple medical issues.  She has asthma, Crohn's disease, acid reflux, headaches, anemia, sleep apnea and arthritis.  Her primary care physician is Dr. Moshe Cipro.  Her neurologist is Dr Elpidio Galea and her gastroenterologist is Dr. Quincy Sheehan.   Alcohol and substance use history. Patient admitted using marijuana but claims to be sober since 2012. Outpatient Encounter Prescriptions as of 05/13/2013  Medication Sig  . adalimumab (HUMIRA) 40 MG/0.8ML injection Inject 0.8 mLs (40 mg total) into the skin every 14 (fourteen) days.  Marland Kitchen albuterol (PROVENTIL HFA;VENTOLIN HFA) 108 (90 BASE) MCG/ACT inhaler Inhale 2 puffs into the lungs every 6 (six) hours as needed for wheezing.  . budesonide (ENTOCORT EC) 3 MG 24 hr capsule Take 3 capsules (9 mg total) by mouth daily.  Marland Kitchen buPROPion (WELLBUTRIN XL) 150 MG 24 hr tablet Take 1 tablet (150 mg total) by mouth 2 (two) times daily.  . calcium-vitamin D (OSCAL) 250-125 MG-UNIT per tablet Take 1 tablet by mouth daily.    . cetirizine (ZYRTEC) 10 MG tablet Take 10 mg by mouth daily.  . chlorpheniramine (CHLOR-TRIMETON) 4 MG tablet Take 4 mg by mouth 2 (two) times daily as needed for allergies.  . ciprofloxacin (CILOXAN) 0.3 % ophthalmic solution Administer 1 drop, every 3 hours, while awake, for 2 days. Then 1 drop, every 4 hours, while awake, for the next 5 days.  . Cyanocobalamin (B-12) 1000 MCG CAPS Take 1 capsule by mouth daily.    Marland Kitchen  diphenhydrAMINE (BENADRYL) 25 mg capsule Take 25 mg by mouth daily as needed. For allergies   . fish oil-omega-3 fatty acids 1000 MG capsule Take 3 g by mouth daily.   . fluconazole (DIFLUCAN) 150 MG tablet Take 1 tablet (150 mg total) by mouth once.  . fluticasone (FLONASE) 50 MCG/ACT nasal spray Place 2 sprays into both nostrils daily.  Marland Kitchen ipratropium (ATROVENT) 0.06 % nasal spray Place 2 sprays into both nostrils 2 (two) times daily.  Marland Kitchen  levofloxacin (LEVAQUIN) 500 MG tablet Take 1 tablet (500 mg total) by mouth daily.  . Liniments (SALONPAS ARTHRITIS PAIN RELIEF) PADS Apply 1 each topically every 8 (eight) hours as needed. pain  . loratadine (CLARITIN) 10 MG tablet Take 1 tablet (10 mg total) by mouth daily.  . Multiple Vitamin (MULTIVITAMIN) tablet Take 1 tablet by mouth daily.  Marland Kitchen omeprazole (PRILOSEC) 20 MG capsule Take 1 capsule (20 mg total) by mouth 2 (two) times daily before a meal.  . potassium chloride SA (K-DUR,KLOR-CON) 20 MEQ tablet TAKE ONE TABLET TWICE DAILY  . predniSONE (STERAPRED UNI-PAK) 5 MG TABS tablet Take 1 tablet (5 mg total) by mouth as directed.  . valACYclovir (VALTREX) 1000 MG tablet Take 2 tablets (2,000 mg total) by mouth every 12 (twelve) hours. For 1 day.  . [DISCONTINUED] buPROPion (WELLBUTRIN XL) 150 MG 24 hr tablet Take 1 tablet (150 mg total) by mouth 2 (two) times daily.    Past Psychiatric History/Hospitalization(s): Patient has multiple psychiatric hospitalizations in the past.  Her last psychiatric hospitalization in 2005 but she also admitted hospitalization in 2008 which we do not have details.  She has history of taking overdose on her medication .  She has history of depression and she was also diagnosed with bipolar disorder.  In the past she has taken Seroquel, Risperdal, Celexa, Lexapro, and Xanax.  She has seen psychologist in the past.  Anxiety: No Bipolar Disorder: Yes Depression: Yes Mania: Yes Psychosis: No Schizophrenia: No Personality Disorder: No Hospitalization for psychiatric illness: Yes History of Electroconvulsive Shock Therapy: No Prior Suicide Attempts: No  Physical Exam: Constitutional:  BP 110/72  Ht 5' 7"  (1.702 m)  Wt 207 lb (93.895 kg)  BMI 32.41 kg/m2  LMP 04/11/2013  General Appearance: alert, oriented, no acute distress and well nourished  Musculoskeletal: Strength & Muscle Tone: within normal limits Gait & Station: normal Patient leans:  N/A  Psychiatric: Speech (describe rate, volume, coherence, spontaneity, and abnormalities if any):  clear and coherent normal tone and volume.    Thought Process (describe rate, content, abstract reasoning, and computation):logical and goal-directed.    Associations: Coherent and Intact  Thoughts: normal  Mental Status: Orientation: oriented to person, place and time/date Mood & Affect: Mood is upbeat and affect is bright Attention Span & Concentration:  fair   Medical Decision Making (Choose Three): Established Problem, Stable/Improving (1), Review of Psycho-Social Stressors (1), Review or order clinical lab tests (1), Review and summation of old records (2), Review of Last Therapy Session (1) and Review of Medication Regimen & Side Effects (2)  Assessment: Axis I: Maj. depressive disorder, recurrent  Axis II: deferred  Axis III:  Patient Active Problem List   Diagnosis Date Noted  . Acute maxillary sinusitis 04/18/2013  . Conjunctivitis, acute, bilateral 04/18/2013  . Apnea, sleep 06/21/2012  . Obesity (BMI 30.0-34.9) 04/04/2012  . Bipolar disorder 04/03/2012  . Hx of nicotine dependence 11/14/2011  . Routine general medical examination at a health care facility 11/14/2011  .  Anterior cervical adenopathy 07/27/2011  . Foot dermatitis 07/22/2011  . High risk for colon cancer 03/15/2011  . Leukocytosis 10/06/2010  . Eczema 08/05/2010  . HSV 08/10/2009  . ECHOCARDIOGRAM, ABNORMAL 01/11/2008  . ANKLE PAIN, CHRONIC 12/28/2007  . Leukocytosis, unspecified 09/11/2007  . HEADACHE, TENSION 07/17/2007  . HYPERGLYCEMIA 02/13/2007  . CROHN'S DISEASE 05/15/2006  . ANEMIA, B12 DEFICIENCY 12/29/2005  . ANXIETY 12/29/2005  . DEPRESSION 12/29/2005  . ALLERGIC RHINITIS 12/29/2005  . ASTHMA 12/29/2005  . GERD 12/29/2005  . PEPTIC ULCER DISEASE 12/29/2005  . ARTHRITIS, GENERALIZED 12/29/2005  . HERNIORRHAPHY, HX OF 12/29/2005    Axis IV: mild to moderate  Axis V:   55-60   Plan:. Patient will continue Wellbutrin XL 150 mg twice a day. I recommend to call us back if she is any question of conservatively worsening of the symptom.  Otherwise I will see her in 4 months.  Time spent 25 minutes.  More than 50% of the time spent and psychoeducation, counseling and coordination of care.    Levonne Spiller, MD 05/13/2013

## 2013-05-14 ENCOUNTER — Ambulatory Visit (INDEPENDENT_AMBULATORY_CARE_PROVIDER_SITE_OTHER): Payer: PRIVATE HEALTH INSURANCE | Admitting: Psychology

## 2013-05-14 ENCOUNTER — Encounter (HOSPITAL_COMMUNITY): Payer: Self-pay | Admitting: Psychology

## 2013-05-14 DIAGNOSIS — F329 Major depressive disorder, single episode, unspecified: Secondary | ICD-10-CM

## 2013-05-14 NOTE — Progress Notes (Signed)
Patient:  [name]   DOB: 09-17-1970  MR Number: [id number]  Location: [facility] [address] [place] [address] Dept: [phone]  Start: 9 AM End: 10 AM  Provider/Observer:     Edgardo Roys PSYD  Chief Complaint:      Chief Complaint  Patient presents with  . Depression    Reason For Service:     The patient was seen in our office for many years and I last saw her in 2009. She was referred to our office because of significant symptoms of depression and sleep disturbance. She has had some inpatient hospitalizations and followup there are office. The patient has been diagnosed with Crohn's disease or reports at the present time that it is been very well managed and is quite pleased with this issue. She has dealt with recurrent major depressive episodes for years.  Interventions Strategy:  Cognitive/behavioral psychotherapeutic interventions  Participation Level:   Active  Participation Quality:  Appropriate      Behavioral Observation:  Well Groomed, Alert, and Appropriate.   Current Psychosocial Factors: The patient reports that family stress is reduced and social factors good.  Content of Session:   Review current symptoms and continue to work on therapeutic coping skills are in issues of stress related to family dysfunction.  Current Status:   The patient reports that her Crohn's disease has stayed in remission and she is doing well with GI.  The patient reports that depression has greatly improved and she is doing better.   Still needing to work on diet, exercise and sleep.  Patient Progress:   Stable  Target Goals:   Target goals include improving coping skills and strategies around this family issues that she is having to cope with a regular basis. Also reducing the intensity, severity, and duration of major depressive events.  Last Reviewed:   05/14/2013  Goals  Addressed Today:    Today we worked on coping skills are in issues of dealing with dysfunctional family members as well as issues of recurrent depression.  Impression/Diagnosis:   The patient has a long history of recurrent major depressive events as well as some anxiety and stress. She has been doing well over the past several years according to the patient and following the treatment recommendations. However, her depression has increased recently apparently do to family stressors.  Diagnosis:    Axis I: MDD (major depressive disorder)

## 2013-05-15 DIAGNOSIS — H1045 Other chronic allergic conjunctivitis: Secondary | ICD-10-CM | POA: Diagnosis not present

## 2013-05-15 DIAGNOSIS — H18239 Secondary corneal edema, unspecified eye: Secondary | ICD-10-CM | POA: Diagnosis not present

## 2013-05-16 DIAGNOSIS — J343 Hypertrophy of nasal turbinates: Secondary | ICD-10-CM | POA: Diagnosis not present

## 2013-05-16 DIAGNOSIS — J310 Chronic rhinitis: Secondary | ICD-10-CM | POA: Diagnosis not present

## 2013-06-03 DIAGNOSIS — G4733 Obstructive sleep apnea (adult) (pediatric): Secondary | ICD-10-CM | POA: Diagnosis not present

## 2013-06-13 DIAGNOSIS — Z79899 Other long term (current) drug therapy: Secondary | ICD-10-CM | POA: Diagnosis not present

## 2013-06-13 DIAGNOSIS — R0609 Other forms of dyspnea: Secondary | ICD-10-CM | POA: Diagnosis not present

## 2013-06-13 DIAGNOSIS — G4733 Obstructive sleep apnea (adult) (pediatric): Secondary | ICD-10-CM | POA: Diagnosis not present

## 2013-06-26 ENCOUNTER — Other Ambulatory Visit: Payer: Self-pay

## 2013-06-26 ENCOUNTER — Encounter: Payer: Self-pay | Admitting: Family Medicine

## 2013-06-26 ENCOUNTER — Encounter: Payer: Self-pay | Admitting: Internal Medicine

## 2013-06-26 MED ORDER — VALACYCLOVIR HCL 1 G PO TABS: 2000.0000 mg | ORAL_TABLET | Freq: Two times a day (BID) | ORAL | Status: DC

## 2013-06-26 NOTE — Telephone Encounter (Signed)
Appears Valtrex refilled by Dr. Moshe Cipro.

## 2013-08-07 ENCOUNTER — Other Ambulatory Visit: Payer: Self-pay | Admitting: Family Medicine

## 2013-08-09 ENCOUNTER — Encounter (HOSPITAL_COMMUNITY): Payer: BC Managed Care – PPO | Attending: Hematology and Oncology

## 2013-08-09 DIAGNOSIS — Z79899 Other long term (current) drug therapy: Secondary | ICD-10-CM | POA: Diagnosis not present

## 2013-08-09 DIAGNOSIS — F17200 Nicotine dependence, unspecified, uncomplicated: Secondary | ICD-10-CM | POA: Diagnosis not present

## 2013-08-09 DIAGNOSIS — E669 Obesity, unspecified: Secondary | ICD-10-CM | POA: Diagnosis not present

## 2013-08-09 DIAGNOSIS — K5090 Crohn's disease, unspecified, without complications: Secondary | ICD-10-CM | POA: Diagnosis not present

## 2013-08-09 DIAGNOSIS — D508 Other iron deficiency anemias: Secondary | ICD-10-CM | POA: Diagnosis not present

## 2013-08-09 DIAGNOSIS — D72829 Elevated white blood cell count, unspecified: Secondary | ICD-10-CM | POA: Insufficient documentation

## 2013-08-09 DIAGNOSIS — B009 Herpesviral infection, unspecified: Secondary | ICD-10-CM | POA: Insufficient documentation

## 2013-08-09 LAB — CBC WITH DIFFERENTIAL/PLATELET
Basophils Absolute: 0 10*3/uL (ref 0.0–0.1)
Basophils Relative: 0 % (ref 0–1)
EOS PCT: 5 % (ref 0–5)
Eosinophils Absolute: 0.6 10*3/uL (ref 0.0–0.7)
HEMATOCRIT: 35 % — AB (ref 36.0–46.0)
HEMOGLOBIN: 11.8 g/dL — AB (ref 12.0–15.0)
LYMPHS ABS: 5.9 10*3/uL — AB (ref 0.7–4.0)
LYMPHS PCT: 47 % — AB (ref 12–46)
MCH: 28.3 pg (ref 26.0–34.0)
MCHC: 33.7 g/dL (ref 30.0–36.0)
MCV: 83.9 fL (ref 78.0–100.0)
MONO ABS: 0.6 10*3/uL (ref 0.1–1.0)
Monocytes Relative: 5 % (ref 3–12)
Neutro Abs: 5.4 10*3/uL (ref 1.7–7.7)
Neutrophils Relative %: 43 % (ref 43–77)
Platelets: 352 10*3/uL (ref 150–400)
RBC: 4.17 MIL/uL (ref 3.87–5.11)
RDW: 14.7 % (ref 11.5–15.5)
WBC: 12.4 10*3/uL — ABNORMAL HIGH (ref 4.0–10.5)

## 2013-08-09 NOTE — Progress Notes (Signed)
LABS DRAWN FOR CBC/DIFF.

## 2013-08-12 DIAGNOSIS — G4733 Obstructive sleep apnea (adult) (pediatric): Secondary | ICD-10-CM | POA: Diagnosis not present

## 2013-08-13 ENCOUNTER — Ambulatory Visit (HOSPITAL_COMMUNITY): Payer: Self-pay | Admitting: Oncology

## 2013-08-15 DIAGNOSIS — G4733 Obstructive sleep apnea (adult) (pediatric): Secondary | ICD-10-CM | POA: Diagnosis not present
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
## 2013-08-26 ENCOUNTER — Other Ambulatory Visit: Payer: Self-pay | Admitting: Family Medicine

## 2013-08-26 DIAGNOSIS — Z1231 Encounter for screening mammogram for malignant neoplasm of breast: Secondary | ICD-10-CM

## 2013-08-27 ENCOUNTER — Encounter (HOSPITAL_COMMUNITY): Payer: Self-pay | Admitting: Oncology

## 2013-08-27 ENCOUNTER — Encounter (HOSPITAL_BASED_OUTPATIENT_CLINIC_OR_DEPARTMENT_OTHER): Payer: BC Managed Care – PPO | Admitting: Oncology

## 2013-08-27 VITALS — BP 114/75 | HR 76 | Temp 98.1°F | Resp 16 | Wt 204.4 lb

## 2013-08-27 DIAGNOSIS — K909 Intestinal malabsorption, unspecified: Secondary | ICD-10-CM

## 2013-08-27 DIAGNOSIS — D508 Other iron deficiency anemias: Secondary | ICD-10-CM | POA: Diagnosis not present

## 2013-08-27 DIAGNOSIS — D649 Anemia, unspecified: Secondary | ICD-10-CM

## 2013-08-27 DIAGNOSIS — F17200 Nicotine dependence, unspecified, uncomplicated: Secondary | ICD-10-CM | POA: Diagnosis not present

## 2013-08-27 DIAGNOSIS — D72829 Elevated white blood cell count, unspecified: Secondary | ICD-10-CM

## 2013-08-27 DIAGNOSIS — D509 Iron deficiency anemia, unspecified: Secondary | ICD-10-CM

## 2013-08-27 DIAGNOSIS — K5090 Crohn's disease, unspecified, without complications: Secondary | ICD-10-CM

## 2013-08-27 DIAGNOSIS — E669 Obesity, unspecified: Secondary | ICD-10-CM | POA: Diagnosis not present

## 2013-08-27 DIAGNOSIS — B009 Herpesviral infection, unspecified: Secondary | ICD-10-CM | POA: Diagnosis not present

## 2013-08-27 LAB — IRON AND TIBC
Iron: 76 ug/dL (ref 42–135)
SATURATION RATIOS: 19 % — AB (ref 20–55)
TIBC: 391 ug/dL (ref 250–470)
UIBC: 315 ug/dL (ref 125–400)

## 2013-08-27 NOTE — Patient Instructions (Signed)
[facility] Discharge Instructions  RECOMMENDATIONS MADE BY THE CONSULTANT AND ANY TEST RESULTS WILL BE SENT TO YOUR REFERRING PHYSICIAN.  EXAM FINDINGS BY THE PHYSICIAN TODAY AND SIGNS OR SYMPTOMS TO REPORT TO CLINIC OR PRIMARY PHYSICIAN: you saw [name] today  You are getting lab work today, we will call if abnormal.  Follow up in 1 year with labs   Thank you for choosing [facility] to provide your oncology and hematology care.  To afford each patient quality time with our providers, please arrive at least 15 minutes before your scheduled appointment time.  With your help, our goal is to use those 15 minutes to complete the necessary work-up to ensure our physicians have the information they need to help with your evaluation and healthcare recommendations.    Effective January 1st, 2014, we ask that you re-schedule your appointment with our physicians should you arrive 10 or more minutes late for your appointment.  We strive to give you quality time with our providers, and arriving late affects you and other patients whose appointments are after yours.    Again, thank you for choosing [facility].  Our hope is that these requests will decrease the amount of time that you wait before being seen by our physicians.       _____________________________________________________________  Should you have questions after your visit to [facility], please contact our office at [phone] between the hours of 8:30 a.m. and 5:00 p.m.  Voicemails left after 4:30 p.m. will not be returned until the following business day.  For prescription refill requests, have your pharmacy contact our office with your prescription refill request.

## 2013-08-27 NOTE — Progress Notes (Signed)
[name]'s reason for visit today is for labs as scheduled per MD orders.  Venipuncture performed with a 23 gauge butterfly needle to L Antecubital.  [name] tolerated procedure well and without incident; questions were answered and patient was discharged.

## 2013-08-28 ENCOUNTER — Encounter (HOSPITAL_COMMUNITY): Payer: Self-pay | Admitting: Oncology

## 2013-08-28 DIAGNOSIS — D509 Iron deficiency anemia, unspecified: Secondary | ICD-10-CM | POA: Insufficient documentation

## 2013-08-28 HISTORY — DX: Iron deficiency anemia, unspecified: D50.9

## 2013-08-28 LAB — FOLATE: Folate: >20 ng/mL

## 2013-08-28 LAB — VITAMIN B12: VITAMIN B 12: 1884 pg/mL — AB (ref 211–911)

## 2013-08-28 LAB — FERRITIN: Ferritin: 16 ng/mL (ref 10–291)

## 2013-08-28 NOTE — Addendum Note (Signed)
Addended by: Baird Cancer on: 08/28/2013 08:44 AM   Modules accepted: Orders

## 2013-08-30 ENCOUNTER — Other Ambulatory Visit (HOSPITAL_COMMUNITY): Payer: Self-pay | Admitting: Hematology and Oncology

## 2013-08-30 ENCOUNTER — Encounter (HOSPITAL_BASED_OUTPATIENT_CLINIC_OR_DEPARTMENT_OTHER): Payer: BC Managed Care – PPO

## 2013-08-30 ENCOUNTER — Encounter (HOSPITAL_COMMUNITY): Payer: Self-pay | Admitting: Oncology

## 2013-08-30 ENCOUNTER — Encounter (HOSPITAL_BASED_OUTPATIENT_CLINIC_OR_DEPARTMENT_OTHER): Payer: BC Managed Care – PPO | Admitting: Oncology

## 2013-08-30 VITALS — BP 120/70 | HR 70 | Temp 98.6°F | Resp 18

## 2013-08-30 VITALS — BP 124/68 | HR 71 | Temp 98.4°F | Resp 16

## 2013-08-30 DIAGNOSIS — K909 Intestinal malabsorption, unspecified: Secondary | ICD-10-CM | POA: Diagnosis not present

## 2013-08-30 DIAGNOSIS — D508 Other iron deficiency anemias: Secondary | ICD-10-CM | POA: Diagnosis not present

## 2013-08-30 DIAGNOSIS — D509 Iron deficiency anemia, unspecified: Secondary | ICD-10-CM

## 2013-08-30 MED ORDER — SODIUM CHLORIDE 0.9 % IV SOLN
510.0000 mg | Freq: Once | INTRAVENOUS | Status: AC
  Administered 2013-08-30: 510 mg via INTRAVENOUS
  Filled 2013-08-30: qty 17

## 2013-08-30 MED ORDER — SODIUM CHLORIDE 0.9 % IV SOLN
INTRAVENOUS | Status: DC
  Administered 2013-08-30: 10:00:00 via INTRAVENOUS

## 2013-08-30 MED ORDER — SODIUM CHLORIDE 0.9 % IJ SOLN
10.0000 mL | Freq: Once | INTRAMUSCULAR | Status: AC
  Administered 2013-08-30: 10 mL via INTRAVENOUS

## 2013-08-30 NOTE — Progress Notes (Signed)
[name] is seen as a quick appointment due to her recently discovered anemia that led to an anemia panel that illustrated iron deficiency.  She is seen today to review her labs and treatment.   I personally reviewed and went over laboratory results with the patient.  The results are noted within this dictation.   We discussed the risks, benefits, alternatives, and side effects of IV Feraheme.  The nurses reviewed them as well prior to infusion.  IV Feraheme was chosen due to malabsorption of PO iron and chronic PPI use which will inhibit the absorption of PO iron.   Return as scheduled for labs and appointments.  I personally reviewed and went over laboratory results with the patient.  The results are noted within this dictation.  KEFALAS,THOMAS 08/30/2013

## 2013-08-30 NOTE — Progress Notes (Signed)
Tolerated well

## 2013-08-30 NOTE — Patient Instructions (Signed)
[facility] Discharge Instructions  RECOMMENDATIONS MADE BY THE CONSULTANT AND ANY TEST RESULTS WILL BE SENT TO YOUR REFERRING PHYSICIAN.  EXAM FINDINGS BY THE PHYSICIAN TODAY AND SIGNS OR SYMPTOMS TO REPORT TO CLINIC OR PRIMARY PHYSICIAN: Exam and findings as discussed by Robynn Pane, PA-C.   INSTRUCTIONS/FOLLOW-UP: Follow-up as scheduled.  Thank you for choosing [facility] to provide your oncology and hematology care.  To afford each patient quality time with our providers, please arrive at least 15 minutes before your scheduled appointment time.  With your help, our goal is to use those 15 minutes to complete the necessary work-up to ensure our physicians have the information they need to help with your evaluation and healthcare recommendations.    Effective January 1st, 2014, we ask that you re-schedule your appointment with our physicians should you arrive 10 or more minutes late for your appointment.  We strive to give you quality time with our providers, and arriving late affects you and other patients whose appointments are after yours.    Again, thank you for choosing [facility].  Our hope is that these requests will decrease the amount of time that you wait before being seen by our physicians.       _____________________________________________________________  Should you have questions after your visit to [facility], please contact our office at [phone] between the hours of 8:30 a.m. and 4:30 p.m.  Voicemails left after 4:30 p.m. will not be returned until the following business day.  For prescription refill requests, have your pharmacy contact our office with your prescription refill request.    _______________________________________________________________  We hope that we have given you very good care.  You may receive a patient satisfaction survey in the mail, please complete it and return it as soon as  possible.  We value your feedback!

## 2013-08-30 NOTE — Progress Notes (Signed)
Here to discuss iron infusion.

## 2013-09-09 ENCOUNTER — Ambulatory Visit (HOSPITAL_COMMUNITY)
Admission: RE | Admit: 2013-09-09 | Discharge: 2013-09-09 | Disposition: A | Discharge status: Home / Self Care | Payer: BC Managed Care – PPO | Source: Ambulatory Visit | Attending: Family Medicine | Admitting: Family Medicine

## 2013-09-09 ENCOUNTER — Encounter (HOSPITAL_COMMUNITY): Payer: BC Managed Care – PPO | Attending: Hematology and Oncology

## 2013-09-09 VITALS — BP 107/61 | HR 88 | Temp 97.9°F | Resp 18

## 2013-09-09 DIAGNOSIS — K909 Intestinal malabsorption, unspecified: Secondary | ICD-10-CM

## 2013-09-09 DIAGNOSIS — D508 Other iron deficiency anemias: Secondary | ICD-10-CM

## 2013-09-09 DIAGNOSIS — D509 Iron deficiency anemia, unspecified: Secondary | ICD-10-CM

## 2013-09-09 DIAGNOSIS — Z1231 Encounter for screening mammogram for malignant neoplasm of breast: Secondary | ICD-10-CM

## 2013-09-09 MED ORDER — SODIUM CHLORIDE 0.9 % IJ SOLN: 10.0000 mL | INTRAMUSCULAR | Status: DC | PRN

## 2013-09-09 MED ORDER — SODIUM CHLORIDE 0.9 % IV SOLN
Freq: Once | INTRAVENOUS | Status: AC
  Administered 2013-09-09: 11:00:00 via INTRAVENOUS

## 2013-09-09 MED ORDER — SODIUM CHLORIDE 0.9 % IV SOLN
510.0000 mg | Freq: Once | INTRAVENOUS | Status: AC
  Administered 2013-09-09: 510 mg via INTRAVENOUS
  Filled 2013-09-09: qty 17

## 2013-09-09 NOTE — Progress Notes (Signed)
Tolerated infusion well.  No s/s reaction noted.  VSS.  A&ox4, in no distress.

## 2013-09-12 ENCOUNTER — Other Ambulatory Visit: Payer: Self-pay | Admitting: Family Medicine

## 2013-09-12 ENCOUNTER — Encounter (HOSPITAL_COMMUNITY): Payer: Self-pay | Admitting: Psychiatry

## 2013-09-12 ENCOUNTER — Telehealth (HOSPITAL_COMMUNITY): Payer: Self-pay | Admitting: *Deleted

## 2013-09-12 ENCOUNTER — Ambulatory Visit (INDEPENDENT_AMBULATORY_CARE_PROVIDER_SITE_OTHER): Payer: PRIVATE HEALTH INSURANCE | Admitting: Psychiatry

## 2013-09-12 VITALS — BP 98/70 | Ht 67.0 in | Wt 208.0 lb

## 2013-09-12 DIAGNOSIS — F321 Major depressive disorder, single episode, moderate: Secondary | ICD-10-CM

## 2013-09-12 MED ORDER — BUPROPION HCL ER (XL) 150 MG PO TB24: 150.0000 mg | ORAL_TABLET | Freq: Two times a day (BID) | ORAL | Status: DC

## 2013-09-12 NOTE — Progress Notes (Signed)
Patient ID: [name], female   DOB: 14-Jun-1970, 43 y.o.   MRN: [id number] Patient ID: [name], female   DOB: 1971-01-29, 43 y.o.   MRN: [id number] Patient ID: [name], female   DOB: 1970/07/13, 43 y.o.   MRN: [id number] Patient ID: [name], female   DOB: 12/19/1970, 43 y.o.   MRN: [id number]  [facility] Progress Note  [name] [id number] 43 y.o.  09/12/2013 9:47 AM  Chief Complaint: "I'm doing well ."  History of Present Illness:  This patient is a 43 year old black female who is married and lives with her 3 children and husband in [place]. She is on disability for Crohn's disease.  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 She got severely constipated with a high fever . Once it was determined what was wrong she's had an up-and-down course with her Crohn's. Currently is under good control because she has figured out which foods make it worse. The patient got depressed after developing Crohn's and was hospitalized twice at behavioral health hospital in the past. this was complicated by the fact that she was abusing marijuana.  The patient returns after 4 months. She is doing very well. She recently had an iron infusion but she doesn't feel any different or better. Her tendon she is generally fairly good anyway. She's no longer smoking she is exercising and trying to drink plenty of water and her health has been improving. She's not had any flareups in her Crohn's disease. Her mood is excellent..     Suicidal Ideation: No Plan Formed: No Patient has means to carry out plan: No  Homicidal Ideation: No Plan Formed: No Patient has means to carry out plan: No  Review of Systems: Psychiatric: Agitation: No Hallucination: No Depressed Mood: No Insomnia: Yes Hypersomnia: No Altered Concentration: No Feels Worthless: No Grandiose Ideas: No Belief In Special Powers: No New/Increased Substance Abuse:  No Compulsions: No  Neurologic: Headache: Yes Seizure: No Paresthesias: No  Social History: Patient lives with her husband and children.  She has one son and 2 daughter.  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  She likes reading listing music walking and fishing.  Medical history Patient has multiple medical issues.  She has asthma, Crohn's disease, acid reflux, headaches, anemia, sleep apnea and arthritis.  Her primary care physician is Dr. Moshe Cipro.  Her neurologist is Dr Elpidio Galea and her gastroenterologist is Dr. Quincy Sheehan.   Alcohol and substance use history. Patient admitted using marijuana but claims to be sober since 2012. Outpatient Encounter Prescriptions as of 09/12/2013  Medication Sig  . adalimumab (HUMIRA) 40 MG/0.8ML injection Inject 0.8 mLs (40 mg total) into the skin every 14 (fourteen) days.  Marland Kitchen albuterol (PROVENTIL HFA;VENTOLIN HFA) 108 (90 BASE) MCG/ACT inhaler Inhale 2 puffs into the lungs every 6 (six) hours as needed for wheezing.  . budesonide (ENTOCORT EC) 3 MG 24 hr capsule Take 3 capsules (9 mg total) by mouth daily.  Marland Kitchen buPROPion (WELLBUTRIN XL) 150 MG 24 hr tablet Take 1 tablet (150 mg total) by mouth 2 (two) times daily.  . calcium-vitamin D (OSCAL) 250-125 MG-UNIT per tablet Take 1 tablet by mouth daily.    . cetirizine (ZYRTEC) 10 MG tablet Take 10 mg by mouth daily.  . chlorpheniramine (CHLOR-TRIMETON) 4 MG tablet Take 4 mg by mouth 2 (two) times daily as needed for allergies.  . ciprofloxacin (CILOXAN) 0.3 % ophthalmic solution Administer 1 drop, every  3 hours, while awake, for 2 days. Then 1 drop, every 4 hours, while awake, for the next 5 days.  . Cyanocobalamin (B-12) 1000 MCG CAPS Take 1 capsule by mouth daily.    . diphenhydrAMINE (BENADRYL) 25 mg capsule Take 25 mg by mouth daily as needed. For allergies   . fish oil-omega-3 fatty acids 1000 MG capsule Take 3 g by mouth daily.   . fluconazole (DIFLUCAN) 150 MG tablet Take 1 tablet (150 mg total) by mouth once.  .  fluticasone (FLONASE) 50 MCG/ACT nasal spray Place 2 sprays into both nostrils daily.  Marland Kitchen ipratropium (ATROVENT) 0.06 % nasal spray Place 2 sprays into both nostrils 2 (two) times daily.  . Liniments (SALONPAS ARTHRITIS PAIN RELIEF) PADS Apply 1 each topically every 8 (eight) hours as needed. pain  . loratadine (CLARITIN) 10 MG tablet Take 1 tablet (10 mg total) by mouth daily.  . Multiple Vitamin (MULTIVITAMIN) tablet Take 1 tablet by mouth daily.  Marland Kitchen omeprazole (PRILOSEC) 20 MG capsule Take 1 capsule (20 mg total) by mouth 2 (two) times daily before a meal.  . PATANOL 0.1 % ophthalmic solution Place 2 drops into both eyes 2 (two) times daily.  . potassium chloride SA (K-DUR,KLOR-CON) 20 MEQ tablet TAKE ONE TABLET TWICE DAILY  . Probiotic Product (PROBIOTIC PO) Take 1 capsule by mouth daily.  . [DISCONTINUED] buPROPion (WELLBUTRIN XL) 150 MG 24 hr tablet Take 1 tablet (150 mg total) by mouth 2 (two) times daily.    Past Psychiatric History/Hospitalization(s): Patient has multiple psychiatric hospitalizations in the past.  Her last psychiatric hospitalization in 2005 but she also admitted hospitalization in 2008 which we do not have details.  She has history of taking overdose on her medication .  She has history of depression and she was also diagnosed with bipolar disorder.  In the past she has taken Seroquel, Risperdal, Celexa, Lexapro, and Xanax.  She has seen psychologist in the past.  Anxiety: No Bipolar Disorder: Yes Depression: Yes Mania: Yes Psychosis: No Schizophrenia: No Personality Disorder: No Hospitalization for psychiatric illness: Yes History of Electroconvulsive Shock Therapy: No Prior Suicide Attempts: No  Physical Exam: Constitutional:  BP 98/70  Ht 5' 7"  (1.702 m)  Wt 208 lb (94.348 kg)  BMI 32.57 kg/m2  LMP 09/09/2013  General Appearance: alert, oriented, no acute distress and well nourished  Musculoskeletal: Strength & Muscle Tone: within normal limits Gait &  Station: normal Patient leans: N/A  Psychiatric: Speech (describe rate, volume, coherence, spontaneity, and abnormalities if any):  clear and coherent normal tone and volume.    Thought Process (describe rate, content, abstract reasoning, and computation):logical and goal-directed.    Associations: Coherent and Intact  Thoughts: normal  Mental Status: Orientation: oriented to person, place and time/date Mood & Affect: Mood is upbeat and affect is bright Attention Span & Concentration:  fair   Medical Decision Making (Choose Three): Established Problem, Stable/Improving (1), Review of Psycho-Social Stressors (1), Review or order clinical lab tests (1), Review and summation of old records (2), Review of Last Therapy Session (1) and Review of Medication Regimen & Side Effects (2)  Assessment: Axis I: Maj. depressive disorder, recurrent  Axis II: deferred  Axis III:  Patient Active Problem List   Diagnosis Date Noted  . Iron deficiency anemia 08/28/2013  . Acute maxillary sinusitis 04/18/2013  . Conjunctivitis, acute, bilateral 04/18/2013  . Apnea, sleep 06/21/2012  . Obesity (BMI 30.0-34.9) 04/04/2012  . Bipolar disorder 04/03/2012  . Hx of nicotine dependence  11/14/2011  . Routine general medical examination at a health care facility 11/14/2011  . Foot dermatitis 07/22/2011  . High risk for colon cancer 03/15/2011  . Leukocytosis 10/06/2010  . Eczema 08/05/2010  . HSV 08/10/2009  . ECHOCARDIOGRAM, ABNORMAL 01/11/2008  . ANKLE PAIN, CHRONIC 12/28/2007  . HEADACHE, TENSION 07/17/2007  . HYPERGLYCEMIA 02/13/2007  . CROHN'S DISEASE 05/15/2006  . ANEMIA, B12 DEFICIENCY 12/29/2005  . ANXIETY 12/29/2005  . DEPRESSION 12/29/2005  . ALLERGIC RHINITIS 12/29/2005  . ASTHMA 12/29/2005  . GERD 12/29/2005  . PEPTIC ULCER DISEASE 12/29/2005  . ARTHRITIS, GENERALIZED 12/29/2005  . HERNIORRHAPHY, HX OF 12/29/2005    Axis IV: mild to moderate  Axis V:  55-60   Plan:. Patient  will continue Wellbutrin XL 150 mg twice a day. I recommend to call us back if she is any question of  worsening of the symptom.  Otherwise I will see her in 4 months.  Time spent 25 minutes.  More than 50% of the time spent and psychoeducation, counseling and coordination of care.    Levonne Spiller, MD 09/12/2013

## 2013-09-13 NOTE — Telephone Encounter (Signed)
done

## 2013-10-08 ENCOUNTER — Other Ambulatory Visit: Payer: Self-pay | Admitting: Gastroenterology

## 2013-10-14 ENCOUNTER — Encounter: Payer: Self-pay | Admitting: Internal Medicine

## 2013-10-17 DIAGNOSIS — G4733 Obstructive sleep apnea (adult) (pediatric): Secondary | ICD-10-CM | POA: Diagnosis not present

## 2013-10-18 ENCOUNTER — Other Ambulatory Visit: Payer: Self-pay | Admitting: Family Medicine

## 2013-10-18 DIAGNOSIS — R5383 Other fatigue: Secondary | ICD-10-CM | POA: Diagnosis not present

## 2013-10-18 DIAGNOSIS — R7301 Impaired fasting glucose: Secondary | ICD-10-CM | POA: Diagnosis not present

## 2013-10-18 DIAGNOSIS — R5381 Other malaise: Secondary | ICD-10-CM | POA: Diagnosis not present

## 2013-10-18 DIAGNOSIS — Z79899 Other long term (current) drug therapy: Secondary | ICD-10-CM | POA: Diagnosis not present

## 2013-10-18 LAB — CBC WITH DIFFERENTIAL/PLATELET
Basophils Absolute: 0 10*3/uL (ref 0.0–0.1)
Basophils Relative: 0 % (ref 0–1)
Eosinophils Absolute: 0.5 10*3/uL (ref 0.0–0.7)
Eosinophils Relative: 4 % (ref 0–5)
HCT: 38.9 % (ref 36.0–46.0)
HEMOGLOBIN: 13 g/dL (ref 12.0–15.0)
Lymphocytes Relative: 47 % — ABNORMAL HIGH (ref 12–46)
Lymphs Abs: 6.1 10*3/uL — ABNORMAL HIGH (ref 0.7–4.0)
MCH: 27.7 pg (ref 26.0–34.0)
MCHC: 33.4 g/dL (ref 30.0–36.0)
MCV: 82.9 fL (ref 78.0–100.0)
MONOS PCT: 6 % (ref 3–12)
Monocytes Absolute: 0.8 10*3/uL (ref 0.1–1.0)
Neutro Abs: 5.5 10*3/uL (ref 1.7–7.7)
Neutrophils Relative %: 43 % (ref 43–77)
Platelets: 374 10*3/uL (ref 150–400)
RBC: 4.69 MIL/uL (ref 3.87–5.11)
RDW: 14.9 % (ref 11.5–15.5)
WBC: 12.9 10*3/uL — ABNORMAL HIGH (ref 4.0–10.5)

## 2013-10-18 LAB — LIPID PANEL
CHOL/HDL RATIO: 2.4 ratio
Cholesterol: 159 mg/dL (ref 0–200)
HDL: 66 mg/dL (ref >39)
LDL Cholesterol: 66 mg/dL (ref 0–99)
Triglycerides: 135 mg/dL (ref <150)
VLDL: 27 mg/dL (ref 0–40)

## 2013-10-18 LAB — BASIC METABOLIC PANEL
BUN: 9 mg/dL (ref 6–23)
CO2: 23 mEq/L (ref 19–32)
Calcium: 9.3 mg/dL (ref 8.4–10.5)
Chloride: 101 mEq/L (ref 96–112)
Creat: 0.76 mg/dL (ref 0.50–1.10)
GLUCOSE: 72 mg/dL (ref 70–99)
POTASSIUM: 4.1 meq/L (ref 3.5–5.3)
Sodium: 135 mEq/L (ref 135–145)

## 2013-10-18 LAB — HEMOGLOBIN A1C
HEMOGLOBIN A1C: 5.4 % (ref <5.7)
Mean Plasma Glucose: 108 mg/dL (ref <117)

## 2013-10-18 LAB — TSH: TSH: 1.95 u[IU]/mL (ref 0.350–4.500)

## 2013-10-22 ENCOUNTER — Ambulatory Visit (INDEPENDENT_AMBULATORY_CARE_PROVIDER_SITE_OTHER): Payer: BC Managed Care – PPO | Admitting: Family Medicine

## 2013-10-22 ENCOUNTER — Encounter: Payer: Self-pay | Admitting: Family Medicine

## 2013-10-22 VITALS — BP 110/72 | HR 71 | Resp 16 | Ht 67.0 in | Wt 200.1 lb

## 2013-10-22 DIAGNOSIS — D509 Iron deficiency anemia, unspecified: Secondary | ICD-10-CM

## 2013-10-22 DIAGNOSIS — E669 Obesity, unspecified: Secondary | ICD-10-CM

## 2013-10-22 DIAGNOSIS — J45909 Unspecified asthma, uncomplicated: Secondary | ICD-10-CM | POA: Diagnosis not present

## 2013-10-22 DIAGNOSIS — F329 Major depressive disorder, single episode, unspecified: Secondary | ICD-10-CM

## 2013-10-22 DIAGNOSIS — J309 Allergic rhinitis, unspecified: Secondary | ICD-10-CM | POA: Diagnosis not present

## 2013-10-22 DIAGNOSIS — Z1211 Encounter for screening for malignant neoplasm of colon: Secondary | ICD-10-CM

## 2013-10-22 DIAGNOSIS — K5090 Crohn's disease, unspecified, without complications: Secondary | ICD-10-CM | POA: Diagnosis not present

## 2013-10-22 DIAGNOSIS — E66811 Obesity, class 1: Secondary | ICD-10-CM

## 2013-10-22 DIAGNOSIS — F3289 Other specified depressive episodes: Secondary | ICD-10-CM

## 2013-10-22 LAB — POC HEMOCCULT BLD/STL (OFFICE/1-CARD/DIAGNOSTIC): Fecal Occult Blood, POC: NEGATIVE

## 2013-10-22 NOTE — Progress Notes (Signed)
   Subjective:    Patient ID: [name], female    DOB: Aug 21, 1970, 43 y.o.   MRN: [id number]  HPI The PT is here for follow up and re-evaluation of chronic medical conditions, medication management and review of any available recent lab and radiology data.  Preventive health is updated, specifically  Cancer screening and Immunization.   Questions or concerns regarding consultations or procedures which the PT has had in the interim are  Addressed.Followed by psych and hematology, recent IV iron has made her feel much improved The PT denies any adverse reactions to current medications since the last visit.  There are no new concerns. Has increased physical activity and is making healthier food choices There are no specific complaints       Review of Systems See HPI Denies recent fever or chills. Denies sinus pressure, nasal congestion, ear pain or sore throat. Denies chest congestion, productive cough or wheezing. Denies chest pains, palpitations and leg swelling Denies abdominal pain, nausea, vomiting,diarrhea or constipation.   Denies dysuria, frequency, hesitancy or incontinence. Denies disabling  joint pain, swelling and limitation in mobility.Intermittent pain which a massage takes care of Denies headaches, seizures, numbness, or tingling. Denies  Uncontrolled depression, anxiety or insomnia. Denies skin break down or rash.        Objective:   Physical Exam  BP 110/72  Pulse 71  Resp 16  Ht 5' 7"  (1.702 m)  Wt 200 lb 1.3 oz (90.756 kg)  BMI 31.33 kg/m2  SpO2 100% pe1 Patient alert and oriented and in no cardiopulmonary distress.  HEENT: No facial asymmetry, EOMI,   oropharynx pink and moist.  Neck supple no JVD, no mass.  Chest: Clear to auscultation bilaterally.  CVS: S1, S2 no murmurs, no S3.Regular rate.  ABD: Soft non tender.No organomegaly or mass, normal BS Rectal:good sphincter tone, heme negative stool,   Ext: No edema  MS: Adequate ROM spine,  shoulders, hips and knees.  Skin: Intact, no ulcerations or rash noted.  Psych: Good eye contact, normal affect. Memory intact not anxious or depressed appearing.  CNS: CN 2-12 intact, power,  normal throughout.no focal deficits noted.       Assessment & Plan:  ALLERGIC RHINITIS Controlled, no change in medication   ASTHMA Infrequent need for albuterol  CROHN'S DISEASE Stable, no recent flare, followed by GI  Iron deficiency anemia Reports good response to IV iron, feels better with this  DEPRESSION Controlled, no change in medication Followed by psychiatry  Obesity (BMI 30.0-34.9) Improved. Pt applauded on succesful weight loss through lifestyle change, and encouraged to continue same. Weight loss goal set for the next several months.

## 2013-10-22 NOTE — Assessment & Plan Note (Signed)
Controlled, no change in medication Followed by psychiatry

## 2013-10-22 NOTE — Patient Instructions (Addendum)
F/u in 6 months , call if you need me before  Pls call for flu vaccine in September   Congrats on healthy life choices  It is important that you exercise regularly at least 30 minutes 5 times a week. If you develop chest pain, have severe difficulty breathing, or feel very tired, stop exercising immediately and seek medical attention    Continue  Healthy food choices so that your health is improved  Weight loss goal of 6 to 10 pounds  Rectal exam today is normal

## 2013-10-22 NOTE — Assessment & Plan Note (Signed)
Stable, no recent flare, followed by GI

## 2013-10-22 NOTE — Assessment & Plan Note (Signed)
Improved. Pt applauded on succesful weight loss through lifestyle change, and encouraged to continue same. Weight loss goal set for the next several months.  

## 2013-10-22 NOTE — Assessment & Plan Note (Signed)
Controlled, no change in medication  

## 2013-10-22 NOTE — Assessment & Plan Note (Signed)
Infrequent need for albuterol

## 2013-10-22 NOTE — Assessment & Plan Note (Signed)
Reports good response to IV iron, feels better with this

## 2013-11-05 ENCOUNTER — Ambulatory Visit (INDEPENDENT_AMBULATORY_CARE_PROVIDER_SITE_OTHER): Payer: BC Managed Care – PPO | Admitting: Internal Medicine

## 2013-11-05 ENCOUNTER — Encounter: Payer: Self-pay | Admitting: Internal Medicine

## 2013-11-05 VITALS — BP 121/76 | HR 74 | Temp 97.6°F | Ht 67.5 in | Wt 199.6 lb

## 2013-11-05 DIAGNOSIS — K5080 Crohn's disease of both small and large intestine without complications: Secondary | ICD-10-CM | POA: Diagnosis not present

## 2013-11-05 NOTE — Progress Notes (Signed)
PATIENT NIC'D
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
## 2013-11-05 NOTE — Patient Instructions (Signed)
Continue Probiotic  Continue humira  Continue prilosec  Office visit her in 1 year

## 2013-12-27 ENCOUNTER — Encounter (HOSPITAL_COMMUNITY): Payer: BC Managed Care – PPO | Attending: Hematology and Oncology

## 2013-12-27 DIAGNOSIS — D509 Iron deficiency anemia, unspecified: Secondary | ICD-10-CM

## 2013-12-27 DIAGNOSIS — D72829 Elevated white blood cell count, unspecified: Secondary | ICD-10-CM

## 2013-12-27 LAB — FERRITIN: Ferritin: 206 ng/mL (ref 10–291)

## 2013-12-27 LAB — CBC WITH DIFFERENTIAL/PLATELET
Basophils Absolute: 0 10*3/uL (ref 0.0–0.1)
Basophils Relative: 0 % (ref 0–1)
EOS PCT: 4 % (ref 0–5)
Eosinophils Absolute: 0.4 10*3/uL (ref 0.0–0.7)
HEMATOCRIT: 37.7 % (ref 36.0–46.0)
HEMOGLOBIN: 12.6 g/dL (ref 12.0–15.0)
LYMPHS ABS: 5.9 10*3/uL — AB (ref 0.7–4.0)
LYMPHS PCT: 51 % — AB (ref 12–46)
MCH: 27.6 pg (ref 26.0–34.0)
MCHC: 33.4 g/dL (ref 30.0–36.0)
MCV: 82.7 fL (ref 78.0–100.0)
MONO ABS: 0.5 10*3/uL (ref 0.1–1.0)
MONOS PCT: 4 % (ref 3–12)
Neutro Abs: 4.7 10*3/uL (ref 1.7–7.7)
Neutrophils Relative %: 41 % — ABNORMAL LOW (ref 43–77)
Platelets: 377 10*3/uL (ref 150–400)
RBC: 4.56 MIL/uL (ref 3.87–5.11)
RDW: 14.7 % (ref 11.5–15.5)
WBC: 11.6 10*3/uL — AB (ref 4.0–10.5)

## 2013-12-27 LAB — IRON AND TIBC
Iron: 108 ug/dL (ref 42–135)
SATURATION RATIOS: 37 % (ref 20–55)
TIBC: 294 ug/dL (ref 250–470)
UIBC: 186 ug/dL (ref 125–400)

## 2013-12-27 NOTE — Progress Notes (Signed)
Labs for cbcd,ferr,iron/tibc

## 2014-01-08 ENCOUNTER — Other Ambulatory Visit: Payer: Self-pay

## 2014-01-08 MED ORDER — POTASSIUM CHLORIDE CRYS ER 20 MEQ PO TBCR: EXTENDED_RELEASE_TABLET | ORAL | Status: DC

## 2014-01-13 ENCOUNTER — Telehealth (HOSPITAL_COMMUNITY): Payer: Self-pay | Admitting: *Deleted

## 2014-01-13 ENCOUNTER — Encounter (HOSPITAL_COMMUNITY): Payer: Self-pay | Admitting: Psychiatry

## 2014-01-13 ENCOUNTER — Ambulatory Visit (INDEPENDENT_AMBULATORY_CARE_PROVIDER_SITE_OTHER): Payer: BC Managed Care – PPO | Admitting: Psychiatry

## 2014-01-13 VITALS — BP 104/77 | HR 83 | Ht 67.5 in | Wt 197.6 lb

## 2014-01-13 DIAGNOSIS — F321 Major depressive disorder, single episode, moderate: Secondary | ICD-10-CM

## 2014-01-13 DIAGNOSIS — F332 Major depressive disorder, recurrent severe without psychotic features: Secondary | ICD-10-CM

## 2014-01-13 MED ORDER — BUPROPION HCL ER (XL) 150 MG PO TB24: 150.0000 mg | ORAL_TABLET | Freq: Two times a day (BID) | ORAL | Status: DC

## 2014-01-13 NOTE — Telephone Encounter (Signed)
Phone call from pharmacy, has question regarding Wellbutrin.

## 2014-01-13 NOTE — Telephone Encounter (Signed)
Pt pharmacy called wanting to get clarifications on pt Wellbutrin. Per Pam (pharmacist) if office meant to send in Wellbutrin XL or SR? Per Dr. Harrington Challenger if they have been filling SR then to keep filling it due to medication have been working for pt. Called Pam back and informed her and she showed understanding.

## 2014-01-13 NOTE — Telephone Encounter (Signed)
Continue what she is already taking

## 2014-01-13 NOTE — Progress Notes (Signed)
Patient ID: [name], female   DOB: [birth year], 43 y.o.   MRN: [id number] Patient ID: [name], female   DOB: 03-Dec-1970, 43 y.o.   MRN: [id number] Patient ID: [name], female   DOB: 12-25-1970, 43 y.o.   MRN: [id number] Patient ID: [name], female   DOB: 1970-04-29, 44 y.o.   MRN: [id number] Patient ID: [name], female   DOB: [birth year], 43 y.o.   MRN: [id number]  [facility] Progress Note  ADRIAN SPECHT [id number] 43 y.o.  01/13/2014 10:42 AM  Chief Complaint: "I'm doing well ."  History of Present Illness:  This patient is a 43 year old black female who is married and lives with her 3 children and husband in [place]. She is on disability for Crohn's disease.  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 She got severely constipated with a high fever . Once it was determined what was wrong she's had an up-and-down course with her Crohn's. Currently is under good control because she has figured out which foods make it worse. The patient got depressed after developing Crohn's and was hospitalized twice at behavioral health hospital in the past. this was complicated by the fact that she was abusing marijuana.  The patient returns after 4 months. She is doing very well. She is exercising and cut out sweets and she has lost 10 pounds. She is trying to take care of her Crohn's disease with appropriate diet. Her mood is been excellent and she is staying active. She's very compliant with  Wellbutrin     Suicidal Ideation: No Plan Formed: No Patient has means to carry out plan: No  Homicidal Ideation: No Plan Formed: No Patient has means to carry out plan: No  Review of Systems: Psychiatric: Agitation: No Hallucination: No Depressed Mood: No Insomnia: Yes Hypersomnia: No Altered Concentration: No Feels Worthless: No Grandiose Ideas: No Belief In Special Powers: No New/Increased Substance Abuse:  No Compulsions: No  Neurologic: Headache: Yes Seizure: No Paresthesias: No  Social History: Patient lives with her husband and children.  She has one son and 2 daughter.  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  She likes reading listing music walking and fishing.  Medical history Patient has multiple medical issues.  She has asthma, Crohn's disease, acid reflux, headaches, anemia, sleep apnea and arthritis.  Her primary care physician is Dr. Moshe Cipro.  Her neurologist is Dr Elpidio Galea and her gastroenterologist is Dr. Quincy Sheehan.   Alcohol and substance use history. Patient admitted using marijuana but claims to be sober since 2012. Outpatient Encounter Prescriptions as of 01/13/2014  Medication Sig  . adalimumab (HUMIRA) 40 MG/0.8ML injection Inject 0.8 mLs (40 mg total) into the skin every 14 (fourteen) days.  . budesonide (ENTOCORT EC) 3 MG 24 hr capsule Take 3 capsules (9 mg total) by mouth daily. (Patient taking differently: Take 9 mg by mouth as needed. )  . buPROPion (WELLBUTRIN XL) 150 MG 24 hr tablet Take 1 tablet (150 mg total) by mouth 2 (two) times daily.  . calcium-vitamin D (OSCAL) 250-125 MG-UNIT per tablet Take 1 tablet by mouth daily.    . cetirizine (ZYRTEC) 10 MG tablet Take 10 mg by mouth as needed.   . chlorpheniramine (CHLOR-TRIMETON) 4 MG tablet Take 4 mg by mouth 2 (two) times daily as needed for allergies.  . Cyanocobalamin (B-12) 1000 MCG CAPS Take 1 capsule by mouth daily.    . diphenhydrAMINE (BENADRYL) 25 mg  capsule Take 25 mg by mouth daily as needed. For allergies   . fish oil-omega-3 fatty acids 1000 MG capsule Take 1,200 mg by mouth daily.   . fluticasone (FLONASE) 50 MCG/ACT nasal spray Place 2 sprays into both nostrils daily.  . Liniments ([place] ARTHRITIS PAIN RELIEF) PADS Apply 1 each topically every 8 (eight) hours as needed. pain  . Multiple Vitamin (MULTIVITAMIN) tablet Take 1 tablet by mouth daily.  Marland Kitchen omeprazole (PRILOSEC) 20 MG capsule TAKE ONE CAPSULE BY MOUTH  TWO TIMES DAILY BEFORE A MEAL  . potassium chloride SA (K-DUR,KLOR-CON) 20 MEQ tablet TAKE ONE TABLET TWICE DAILY  . PROAIR HFA 108 (90 BASE) MCG/ACT inhaler USE 2 PUFFS EVERY 6 HOURS AS NEEDED FOR WHEEZING  . Probiotic Product (PROBIOTIC PO) Take 1 capsule by mouth daily.  . [DISCONTINUED] buPROPion (WELLBUTRIN XL) 150 MG 24 hr tablet Take 1 tablet (150 mg total) by mouth 2 (two) times daily.  . [DISCONTINUED] fluconazole (DIFLUCAN) 150 MG tablet Take 1 tablet (150 mg total) by mouth once.  . [DISCONTINUED] ipratropium (ATROVENT) 0.06 % nasal spray Place 2 sprays into both nostrils 2 (two) times daily.  . [DISCONTINUED] loratadine (CLARITIN) 10 MG tablet Take 1 tablet (10 mg total) by mouth daily.  . [DISCONTINUED] PATANOL 0.1 % ophthalmic solution Place 2 drops into both eyes 2 (two) times daily.    Past Psychiatric History/Hospitalization(s): Patient has multiple psychiatric hospitalizations in the past.  Her last psychiatric hospitalization in 2005 but she also admitted hospitalization in 2008 which we do not have details.  She has history of taking overdose on her medication .  She has history of depression and she was also diagnosed with bipolar disorder.  In the past she has taken Seroquel, Risperdal, Celexa, Lexapro, and Xanax.  She has seen psychologist in the past.  Anxiety: No Bipolar Disorder: Yes Depression: Yes Mania: Yes Psychosis: No Schizophrenia: No Personality Disorder: No Hospitalization for psychiatric illness: Yes History of Electroconvulsive Shock Therapy: No Prior Suicide Attempts: No  Physical Exam: Constitutional:  BP 104/77 mmHg  Pulse 83  Ht 5' 7.5" (1.715 m)  Wt 197 lb 9.6 oz (89.631 kg)  BMI 30.47 kg/m2  General Appearance: alert, oriented, no acute distress and well nourished  Musculoskeletal: Strength & Muscle Tone: within normal limits Gait & Station: normal Patient leans: N/A  Psychiatric: Speech (describe rate, volume, coherence, spontaneity,  and abnormalities if any):  clear and coherent normal tone and volume.    Thought Process (describe rate, content, abstract reasoning, and computation):logical and goal-directed.    Associations: Coherent and Intact  Thoughts: normal  Mental Status: Orientation: oriented to person, place and time/date Mood & Affect: Mood is upbeat and affect is bright Attention Span & Concentration:  fair   Medical Decision Making (Choose Three): Established Problem, Stable/Improving (1), Review of Psycho-Social Stressors (1), Review or order clinical lab tests (1), Review and summation of old records (2), Review of Last Therapy Session (1) and Review of Medication Regimen & Side Effects (2)  Assessment: Axis I: Maj. depressive disorder, recurrent  Axis II: deferred  Axis III:  Patient Active Problem List   Diagnosis Date Noted  . Iron deficiency anemia 08/28/2013  . Apnea, sleep 06/21/2012  . Obesity (BMI 30.0-34.9) 04/04/2012  . Bipolar disorder 04/03/2012  . Hx of nicotine dependence 11/14/2011  . Routine general medical examination at a health care facility 11/14/2011  . High risk for colon cancer 03/15/2011  . Leukocytosis 10/06/2010  . HSV 08/10/2009  .  ECHOCARDIOGRAM, ABNORMAL 01/11/2008  . HYPERGLYCEMIA 02/13/2007  . CROHN'S DISEASE 05/15/2006  . ANEMIA, B12 DEFICIENCY 12/29/2005  . ANXIETY 12/29/2005  . DEPRESSION 12/29/2005  . ALLERGIC RHINITIS 12/29/2005  . ASTHMA 12/29/2005  . GERD 12/29/2005  . PEPTIC ULCER DISEASE 12/29/2005  . HERNIORRHAPHY, HX OF 12/29/2005    Axis IV: mild to moderate  Axis V:  55-60   Plan:. Patient will continue Wellbutrin XL 150 mg twice a day. I recommend to call us back if she is any question of  worsening of the symptom.  Otherwise I will see her in 4 months.  Time spent 15 minutes.  More than 50% of the time spent and psychoeducation, counseling and coordination of care.    Levonne Spiller, MD 01/13/2014

## 2014-01-14 NOTE — Telephone Encounter (Signed)
Spoke with Pam the pharmacist about Dr. Harrington Challenger decision and she showed understanding

## 2014-02-08 ENCOUNTER — Other Ambulatory Visit: Payer: Self-pay | Admitting: Family Medicine

## 2014-02-18 ENCOUNTER — Telehealth: Payer: Self-pay | Admitting: *Deleted

## 2014-02-18 NOTE — Telephone Encounter (Signed)
Pt called [facility] stating she is in need of a Doctor's appt for tomorrow and will explain to the nurse why when she calls. Please advise

## 2014-02-18 NOTE — Telephone Encounter (Signed)
appt scheduled for Thursday

## 2014-02-20 ENCOUNTER — Encounter: Payer: Self-pay | Admitting: *Deleted

## 2014-02-20 ENCOUNTER — Ambulatory Visit: Payer: BC Managed Care – PPO | Admitting: Family Medicine

## 2014-02-21 ENCOUNTER — Ambulatory Visit (INDEPENDENT_AMBULATORY_CARE_PROVIDER_SITE_OTHER): Payer: BC Managed Care – PPO | Admitting: Family Medicine

## 2014-02-21 ENCOUNTER — Encounter: Payer: Self-pay | Admitting: Family Medicine

## 2014-02-21 VITALS — BP 98/60 | HR 97 | Resp 18 | Ht 67.0 in | Wt 197.0 lb

## 2014-02-21 DIAGNOSIS — B001 Herpesviral vesicular dermatitis: Secondary | ICD-10-CM

## 2014-02-21 DIAGNOSIS — J209 Acute bronchitis, unspecified: Secondary | ICD-10-CM | POA: Diagnosis not present

## 2014-02-21 MED ORDER — AZITHROMYCIN 250 MG PO TABS: ORAL_TABLET | ORAL | Status: DC

## 2014-02-21 MED ORDER — ACYCLOVIR 400 MG PO TABS: 400.0000 mg | ORAL_TABLET | Freq: Three times a day (TID) | ORAL | Status: DC

## 2014-02-21 MED ORDER — BENZONATATE 100 MG PO CAPS: 100.0000 mg | ORAL_CAPSULE | Freq: Two times a day (BID) | ORAL | Status: DC | PRN

## 2014-02-21 NOTE — Assessment & Plan Note (Signed)
Antibiotic and decongestant prescribed 

## 2014-02-21 NOTE — Progress Notes (Signed)
   Subjective:    Patient ID: [name], female    DOB: 04/02/1970, 43 y.o.   MRN: [id number]  HPI Exposed to repiratory infection 1 week ago, has experienced increased cough and chest congestion since then, and chills with grey sputum, denies sinus pressure or sore throat Did not take humara this week as a result of this as she was concerned  Fever blister to right upper lip last week used topical prep, now has scarring in area   Review of Systems See HPI Denies abdominal pain, nausea, vomiting,diarrhea or constipation.   Denies dysuria, frequency, hesitancy or incontinence. Denies joint pain, swelling and limitation in mobility. Denies headaches, seizures, numbness, or tingling. Denies uncontrolled  depression, anxiety or insomnia.       Objective:   Physical Exam  BP 98/60 mmHg  Pulse 97  Resp 18  Ht 5' 7"  (1.702 m)  Wt 197 lb 0.6 oz (89.377 kg)  BMI 30.85 kg/m2  SpO2 97% Patient alert and oriented and in no cardiopulmonary distress.  HEENT: No facial asymmetry, EOMI,   oropharynx pink and moist.  Neck supple no JVD, no mass. No sinus tenderness, TM clear Chest: adequate air entry, bibasilar crackles,  No wheezes  CVS: S1, S2 no murmurs, no S3.Regular rate.  ABD: Soft non tender.   Ext: No edema  MS: Adequate ROM spine, shoulders, hips and knees.  Skin: Intact, hypopigmented scar on right upper lip outer aspectd.  Psych: Good eye contact, normal affect. Memory intact not anxious or depressed appearing.       Assessment & Plan:  Acute bronchitis Antibiotic and decongestant prescribed  Fever blister Recent infection now cleared up but scar present on right upper lip where topical med was applied, I have prescribed acyclovir for her to use when she gets another flare

## 2014-02-21 NOTE — Patient Instructions (Signed)
F/u in February as before  Antibiotic and decongestant are prescribed for acute bronchitis  Acyclovir is prescribed, for fever blister the next time you have an outbreak, oK to fill and hold  All the best for 2016!

## 2014-02-22 NOTE — Assessment & Plan Note (Signed)
Recent infection now cleared up but scar present on right upper lip where topical med was applied, I have prescribed acyclovir for her to use when she gets another flare

## 2014-03-05 ENCOUNTER — Other Ambulatory Visit: Payer: Self-pay | Admitting: Gastroenterology

## 2014-03-13 ENCOUNTER — Encounter: Payer: Self-pay | Admitting: Family Medicine

## 2014-03-13 NOTE — Telephone Encounter (Signed)
Spoke with pt on the phone and she reports a dime sized lump under right arm that is sore to the touch. Not draining anything yet but she does have a history of having to have a boil drained under her other arm. Wants to know if she needs an antibiotic?

## 2014-03-16 ENCOUNTER — Encounter (HOSPITAL_COMMUNITY): Payer: Self-pay

## 2014-03-16 ENCOUNTER — Emergency Department (HOSPITAL_COMMUNITY)
Admission: EM | Admit: 2014-03-16 | Discharge: 2014-03-16 | Disposition: A | Discharge status: Home / Self Care | Payer: BLUE CROSS/BLUE SHIELD | Attending: Emergency Medicine | Admitting: Emergency Medicine

## 2014-03-16 DIAGNOSIS — K219 Gastro-esophageal reflux disease without esophagitis: Secondary | ICD-10-CM | POA: Insufficient documentation

## 2014-03-16 DIAGNOSIS — M1990 Unspecified osteoarthritis, unspecified site: Secondary | ICD-10-CM | POA: Diagnosis not present

## 2014-03-16 DIAGNOSIS — E785 Hyperlipidemia, unspecified: Secondary | ICD-10-CM | POA: Diagnosis not present

## 2014-03-16 DIAGNOSIS — F419 Anxiety disorder, unspecified: Secondary | ICD-10-CM | POA: Insufficient documentation

## 2014-03-16 DIAGNOSIS — J45909 Unspecified asthma, uncomplicated: Secondary | ICD-10-CM | POA: Insufficient documentation

## 2014-03-16 DIAGNOSIS — Z792 Long term (current) use of antibiotics: Secondary | ICD-10-CM | POA: Diagnosis not present

## 2014-03-16 DIAGNOSIS — Z862 Personal history of diseases of the blood and blood-forming organs and certain disorders involving the immune mechanism: Secondary | ICD-10-CM | POA: Diagnosis not present

## 2014-03-16 DIAGNOSIS — Z79899 Other long term (current) drug therapy: Secondary | ICD-10-CM | POA: Diagnosis not present

## 2014-03-16 DIAGNOSIS — F329 Major depressive disorder, single episode, unspecified: Secondary | ICD-10-CM | POA: Diagnosis not present

## 2014-03-16 DIAGNOSIS — L02411 Cutaneous abscess of right axilla: Secondary | ICD-10-CM | POA: Diagnosis present

## 2014-03-16 DIAGNOSIS — Z7952 Long term (current) use of systemic steroids: Secondary | ICD-10-CM | POA: Insufficient documentation

## 2014-03-16 DIAGNOSIS — Z8711 Personal history of peptic ulcer disease: Secondary | ICD-10-CM | POA: Diagnosis not present

## 2014-03-16 MED ORDER — HYDROCODONE-ACETAMINOPHEN 5-325 MG PO TABS: ORAL_TABLET | ORAL | Status: DC

## 2014-03-16 MED ORDER — SULFAMETHOXAZOLE-TRIMETHOPRIM 800-160 MG PO TABS: 1.0000 | ORAL_TABLET | Freq: Two times a day (BID) | ORAL | Status: DC

## 2014-03-16 MED ORDER — LIDOCAINE HCL (PF) 1 % IJ SOLN
5.0000 mL | Freq: Once | INTRAMUSCULAR | Status: AC
  Administered 2014-03-16: 5 mL via INTRADERMAL
  Filled 2014-03-16: qty 5

## 2014-03-16 NOTE — ED Notes (Signed)
Pt reports abscess under r arm since Tuesday.
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
## 2014-03-17 ENCOUNTER — Encounter: Payer: Self-pay | Admitting: Family Medicine

## 2014-03-17 ENCOUNTER — Telehealth: Payer: Self-pay

## 2014-03-17 NOTE — Telephone Encounter (Signed)
Record review shows I/D on 01/10 with packing removal in 2 days which would be 1/12, here or at Ed, better to return to area where procedure done so let her knwo this is my recommendation Inquire if the pain and swelling are much improved since I/D done, which is the stabdard treatment of abcess. If new probs call for appt   ??pls LET ME KNOW, pt tends to be very concerned about her health/anxious

## 2014-03-17 NOTE — Telephone Encounter (Signed)
Patient will followup with the ED.  Advised patient to call and see if this can be scheduled so that she does not have to wait as long.

## 2014-03-19 ENCOUNTER — Emergency Department (HOSPITAL_COMMUNITY)
Admission: EM | Admit: 2014-03-19 | Discharge: 2014-03-19 | Disposition: A | Discharge status: Home / Self Care | Payer: BLUE CROSS/BLUE SHIELD | Attending: Emergency Medicine | Admitting: Emergency Medicine

## 2014-03-19 ENCOUNTER — Encounter (HOSPITAL_COMMUNITY): Payer: Self-pay | Admitting: *Deleted

## 2014-03-19 DIAGNOSIS — Z87891 Personal history of nicotine dependence: Secondary | ICD-10-CM | POA: Insufficient documentation

## 2014-03-19 DIAGNOSIS — F319 Bipolar disorder, unspecified: Secondary | ICD-10-CM | POA: Diagnosis not present

## 2014-03-19 DIAGNOSIS — Z4800 Encounter for change or removal of nonsurgical wound dressing: Secondary | ICD-10-CM

## 2014-03-19 DIAGNOSIS — Z4801 Encounter for change or removal of surgical wound dressing: Secondary | ICD-10-CM | POA: Diagnosis present

## 2014-03-19 DIAGNOSIS — D509 Iron deficiency anemia, unspecified: Secondary | ICD-10-CM | POA: Diagnosis not present

## 2014-03-19 DIAGNOSIS — Z79899 Other long term (current) drug therapy: Secondary | ICD-10-CM | POA: Insufficient documentation

## 2014-03-19 DIAGNOSIS — D519 Vitamin B12 deficiency anemia, unspecified: Secondary | ICD-10-CM | POA: Diagnosis not present

## 2014-03-19 DIAGNOSIS — Z7951 Long term (current) use of inhaled steroids: Secondary | ICD-10-CM | POA: Diagnosis not present

## 2014-03-19 DIAGNOSIS — F419 Anxiety disorder, unspecified: Secondary | ICD-10-CM | POA: Diagnosis not present

## 2014-03-19 DIAGNOSIS — Z792 Long term (current) use of antibiotics: Secondary | ICD-10-CM | POA: Insufficient documentation

## 2014-03-19 DIAGNOSIS — M1990 Unspecified osteoarthritis, unspecified site: Secondary | ICD-10-CM | POA: Insufficient documentation

## 2014-03-19 DIAGNOSIS — K219 Gastro-esophageal reflux disease without esophagitis: Secondary | ICD-10-CM | POA: Insufficient documentation

## 2014-03-19 DIAGNOSIS — J45909 Unspecified asthma, uncomplicated: Secondary | ICD-10-CM | POA: Diagnosis not present

## 2014-03-19 DIAGNOSIS — Z8711 Personal history of peptic ulcer disease: Secondary | ICD-10-CM | POA: Insufficient documentation

## 2014-03-19 DIAGNOSIS — E876 Hypokalemia: Secondary | ICD-10-CM | POA: Diagnosis not present

## 2014-03-19 NOTE — ED Notes (Signed)
Pt states she is here for packing removal placed 2 days ago. Site is to right axilla area.
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
## 2014-03-19 NOTE — Discharge Instructions (Signed)
Dressing Change A dressing is a material placed over wounds. It keeps the wound clean, dry, and protected from further injury. This provides an environment that favors wound healing.  BEFORE YOU BEGIN  Get your supplies together. Things you may need include:  Saline solution.  Flexible gauze dressing.  Medicated cream.  Tape.  Gloves.  Abdominal dressing pads.  Gauze squares.  Plastic bags.  Take pain medicine 30 minutes before the dressing change if you need it.  Take a shower before you do the first dressing change of the day. Use plastic wrap or a plastic bag to prevent the dressing from getting wet. REMOVING YOUR OLD DRESSING   Wash your hands with soap and water. Dry your hands with a clean towel.  Put on your gloves.  Remove any tape.  Carefully remove the old dressing. If the dressing sticks, you may dampen it with warm water to loosen it, or follow your caregiver's specific directions.  Remove any gauze or packing tape that is in your wound.  Take off your gloves.  Put the gloves, tape, gauze, or any packing tape into a plastic bag. CHANGING YOUR DRESSING  Open the supplies.  Take the cap off the saline solution.  Open the gauze package so that the gauze remains on the inside of the package.  Put on your gloves.  Clean your wound as told by your caregiver.  If you have been told to keep your wound dry, follow those instructions.  Your caregiver may tell you to do one or more of the following:  Pick up the gauze. Pour the saline solution over the gauze. Squeeze out the extra saline solution.  Put medicated cream or other medicine on your wound if you have been told to do so.  Put the solution soaked gauze only in your wound, not on the skin around it.  Pack your wound loosely or as told by your caregiver.  Put dry gauze on your wound.  Put abdominal dressing pads over the dry gauze if your wet gauze soaks through.  Tape the abdominal dressing  pads in place so they will not fall off. Do not wrap the tape completely around the affected part (arm, leg, abdomen).  Wrap the dressing pads with a flexible gauze dressing to secure it in place.  Take off your gloves. Put them in the plastic bag with the old dressing. Tie the bag shut and throw it away.  Keep the dressing clean and dry until your next dressing change.  Wash your hands. SEEK MEDICAL CARE IF:  Your skin around the wound looks red.  Your wound feels more tender or sore.  You see pus in the wound.  Your wound smells bad.  You have a fever.  Your skin around the wound has a rash that itches and burns.  You see black or yellow skin in your wound that was not there before.  You feel nauseous, throw up, and feel very tired. Document Released: 03/31/2004 Document Revised: 05/16/2011 Document Reviewed: 01/03/2011 [employer] Patient Information 2015 [place], [place]. This information is not intended to replace advice given to you by your health care provider. Make sure you discuss any questions you have with your health care provider.

## 2014-03-31 ENCOUNTER — Encounter: Payer: Self-pay | Admitting: Internal Medicine

## 2014-04-01 ENCOUNTER — Other Ambulatory Visit: Payer: Self-pay | Admitting: Gastroenterology

## 2014-04-01 MED ORDER — HYDROCORTISONE 2.5 % RE CREA: 1.0000 "application " | TOPICAL_CREAM | Freq: Two times a day (BID) | RECTAL | Status: DC

## 2014-04-16 ENCOUNTER — Other Ambulatory Visit: Payer: Self-pay

## 2014-04-16 MED ORDER — ADALIMUMAB 40 MG/0.8ML ~~LOC~~ PSKT: 40.0000 mg | PREFILLED_SYRINGE | SUBCUTANEOUS | Status: DC

## 2014-04-24 ENCOUNTER — Ambulatory Visit (INDEPENDENT_AMBULATORY_CARE_PROVIDER_SITE_OTHER): Payer: BLUE CROSS/BLUE SHIELD | Admitting: Family Medicine

## 2014-04-24 ENCOUNTER — Encounter: Payer: Self-pay | Admitting: Family Medicine

## 2014-04-24 VITALS — BP 122/82 | HR 88 | Resp 16 | Ht 67.0 in | Wt 200.0 lb

## 2014-04-24 DIAGNOSIS — D509 Iron deficiency anemia, unspecified: Secondary | ICD-10-CM | POA: Diagnosis not present

## 2014-04-24 DIAGNOSIS — F32A Depression, unspecified: Secondary | ICD-10-CM

## 2014-04-24 DIAGNOSIS — B009 Herpesviral infection, unspecified: Secondary | ICD-10-CM | POA: Diagnosis not present

## 2014-04-24 DIAGNOSIS — E669 Obesity, unspecified: Secondary | ICD-10-CM

## 2014-04-24 DIAGNOSIS — Z79899 Other long term (current) drug therapy: Secondary | ICD-10-CM

## 2014-04-24 DIAGNOSIS — F4321 Adjustment disorder with depressed mood: Secondary | ICD-10-CM | POA: Diagnosis not present

## 2014-04-24 DIAGNOSIS — F329 Major depressive disorder, single episode, unspecified: Secondary | ICD-10-CM

## 2014-04-24 DIAGNOSIS — E559 Vitamin D deficiency, unspecified: Secondary | ICD-10-CM | POA: Diagnosis not present
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
## 2014-04-25 ENCOUNTER — Ambulatory Visit (HOSPITAL_COMMUNITY): Payer: Self-pay | Admitting: Psychology

## 2014-04-28 ENCOUNTER — Ambulatory Visit (INDEPENDENT_AMBULATORY_CARE_PROVIDER_SITE_OTHER): Payer: PRIVATE HEALTH INSURANCE | Admitting: Psychology

## 2014-04-28 DIAGNOSIS — F321 Major depressive disorder, single episode, moderate: Secondary | ICD-10-CM

## 2014-04-28 DIAGNOSIS — F4320 Adjustment disorder, unspecified: Secondary | ICD-10-CM | POA: Insufficient documentation

## 2014-04-28 DIAGNOSIS — F4321 Adjustment disorder with depressed mood: Secondary | ICD-10-CM | POA: Insufficient documentation

## 2014-04-28 NOTE — Progress Notes (Addendum)
   Subjective:    Patient ID: [name], female    DOB: 04-15-1970, 44 y.o.   MRN: [id number]  HPI  1 week h/o cough and chest congestion, sputum is thick and yellow, no fever , has had chills, no response to OTC meds and is feeling progressively worse Denies ear pain, sinus pressure or sore throat No known sick contact Had fever blister on lip which is now resolving needs med for recurrent flare  Review of Systems See HPI  Denies chest pains, palpitations and leg swelling Denies abdominal pain, nausea, vomiting,diarrhea or constipation.   Denies dysuria, frequency, hesitancy or incontinence. Denies joint pain, swelling and limitation in mobility. Denies headaches, seizures, numbness, or tingling. Denies depression, anxiety or insomnia.        Objective:   Physical Exam BP 98/60 mmHg  Pulse 97  Resp 18  Ht 5' 7"  (1.702 m)  Wt 197 lb 0.6 oz (89.377 kg)  BMI 30.85 kg/m2  SpO2 97% Patient alert and oriented and in no cardiopulmonary distress.  HEENT: No facial asymmetry, EOMI,   oropharynx pink and moist.  Neck supple no JVD, no mass. TM clear, no cervical adenopathy, no sinus tenderness Chest: adequate air entry, scattered crackles , no wheezes  CVS: S1, S2 no murmurs, no S3.Regular rate.  ABD: Soft non tender.   Ext: No edema  MS: Adequate ROM spine, shoulders, hips and knees.  Skin: Intact, healed fever blister on lip  Psych: Good eye contact, normal affect. Memory intact not anxious or depressed appearing.  CNS: CN 2-12 intact, power,  normal throughout.no focal deficits noted.        Assessment & Plan:  Acute bronchitis Antibiotic and decongestant prescribed   Fever blister Recent infection now cleared up but scar present on right upper lip where topical med was applied, I have prescribed acyclovir for her to use when she gets another flare

## 2014-04-28 NOTE — Assessment & Plan Note (Signed)
No current flare, has medication available in the event of a flare
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
## 2014-04-28 NOTE — Assessment & Plan Note (Signed)
Currently experiencing loose stool, liekly stress related, follows with GI on regular basis, no new meds, if worsens, she will contact GI

## 2014-04-28 NOTE — Assessment & Plan Note (Signed)
Reports benefit from iron infusion

## 2014-04-28 NOTE — Assessment & Plan Note (Signed)
Deteriorated. Patient re-educated about  the importance of commitment to a  minimum of 150 minutes of exercise per week. The importance of healthy food choices with portion control discussed. Encouraged to start a food diary, count calories and to consider  joining a support group. Sample diet sheets offered. Goals set by the patient for the next several months.    

## 2014-05-02 ENCOUNTER — Encounter (HOSPITAL_COMMUNITY): Payer: BLUE CROSS/BLUE SHIELD | Attending: Hematology & Oncology

## 2014-05-02 DIAGNOSIS — D72829 Elevated white blood cell count, unspecified: Secondary | ICD-10-CM | POA: Diagnosis not present

## 2014-05-02 DIAGNOSIS — D509 Iron deficiency anemia, unspecified: Secondary | ICD-10-CM | POA: Insufficient documentation

## 2014-05-02 LAB — CBC WITH DIFFERENTIAL/PLATELET
Basophils Absolute: 0.1 10*3/uL (ref 0.0–0.1)
Basophils Relative: 1 % (ref 0–1)
Eosinophils Absolute: 0.5 10*3/uL (ref 0.0–0.7)
Eosinophils Relative: 5 % (ref 0–5)
HCT: 35.2 % — ABNORMAL LOW (ref 36.0–46.0)
Hemoglobin: 11.5 g/dL — ABNORMAL LOW (ref 12.0–15.0)
LYMPHS PCT: 47 % — AB (ref 12–46)
Lymphs Abs: 5.2 10*3/uL — ABNORMAL HIGH (ref 0.7–4.0)
MCH: 27.6 pg (ref 26.0–34.0)
MCHC: 32.7 g/dL (ref 30.0–36.0)
MCV: 84.6 fL (ref 78.0–100.0)
MONO ABS: 0.7 10*3/uL (ref 0.1–1.0)
Monocytes Relative: 7 % (ref 3–12)
NEUTROS ABS: 4.3 10*3/uL (ref 1.7–7.7)
Neutrophils Relative %: 40 % — ABNORMAL LOW (ref 43–77)
PLATELETS: 357 10*3/uL (ref 150–400)
RBC: 4.16 MIL/uL (ref 3.87–5.11)
RDW: 14.8 % (ref 11.5–15.5)
WBC: 10.8 10*3/uL — AB (ref 4.0–10.5)

## 2014-05-02 LAB — IRON AND TIBC
Iron: 82 ug/dL (ref 42–145)
Saturation Ratios: 29 % (ref 20–55)
TIBC: 286 ug/dL (ref 250–470)
UIBC: 204 ug/dL (ref 125–400)

## 2014-05-02 LAB — FERRITIN: Ferritin: 144 ng/mL (ref 10–291)

## 2014-05-02 NOTE — Progress Notes (Signed)
LABS FOR IRON/TIBC, FERR,CBCD

## 2014-05-08 ENCOUNTER — Other Ambulatory Visit: Payer: Self-pay | Admitting: Gastroenterology

## 2014-05-09 ENCOUNTER — Encounter: Payer: Self-pay | Admitting: Internal Medicine

## 2014-05-14 ENCOUNTER — Encounter (HOSPITAL_COMMUNITY): Payer: Self-pay | Admitting: Psychiatry

## 2014-05-14 ENCOUNTER — Ambulatory Visit (INDEPENDENT_AMBULATORY_CARE_PROVIDER_SITE_OTHER): Payer: PRIVATE HEALTH INSURANCE | Admitting: Psychiatry

## 2014-05-14 ENCOUNTER — Telehealth (HOSPITAL_COMMUNITY): Payer: Self-pay | Admitting: *Deleted

## 2014-05-14 VITALS — BP 109/63 | HR 72 | Ht 67.0 in | Wt 202.8 lb

## 2014-05-14 DIAGNOSIS — F321 Major depressive disorder, single episode, moderate: Secondary | ICD-10-CM

## 2014-05-14 MED ORDER — TRAZODONE HCL 50 MG PO TABS: 50.0000 mg | ORAL_TABLET | Freq: Every day | ORAL | Status: DC

## 2014-05-14 MED ORDER — BUPROPION HCL ER (XL) 150 MG PO TB24: 150.0000 mg | ORAL_TABLET | Freq: Two times a day (BID) | ORAL | Status: DC

## 2014-05-14 NOTE — Telephone Encounter (Signed)
Pt pharmacy called wanting to get clarifications on pt Wellbutrin. Per Pam (pharmacist) if office meant to send in Wellbutrin XL or SR? Per Dr. Harrington Challenger if they have been filling SR then to keep filling it due to medication have been working for pt. Called Pam back and informed her and she showed understanding. Spoke with Melissa and she will get pt medication ready

## 2014-05-14 NOTE — Progress Notes (Signed)
Patient ID: [name], female   DOB: Jul 25, 1970, 44 y.o.   MRN: [id number] Patient ID: [name], female   DOB: 10-26-1970, 45 y.o.   MRN: [id number] Patient ID: [name], female   DOB: 1970-05-29, 44 y.o.   MRN: [id number] Patient ID: [name], female   DOB: 10-Apr-1970, 44 y.o.   MRN: [id number] Patient ID: [name], female   DOB: September 28, 1970, 44 y.o.   MRN: [id number] Patient ID: [name], female   DOB: [birth year], 44 y.o.   MRN: [id number]  [facility] [id number] Progress Note  BRIEANN OSINSKI [id number] 44 y.o.  05/14/2014 10:38 AM  Chief Complaint: "I've had a lot of deaths in the family"  History of Present Illness:  This patient is a 44 year old black female who is married and lives with her 3 children and husband in [place]. She is on disability for Crohn's disease.  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 She got severely constipated with a high fever . Once it was determined what was wrong she's had an up-and-down course with her Crohn's. Currently is under good control because she has figured out which foods make it worse. The patient got depressed after developing Crohn's and was hospitalized twice at behavioral health hospital in the past. this was complicated by the fact that she was abusing marijuana.  The patient returns after 4 months. She she states that she's had a lot of deaths in her family since January. Her aunt and stepfather died as well as a cousin and her husband's aunt. She's been close to all of these people. She's been more depressed but is trying to "not think about it and stay active. She is gotten back into see Dr. Jefm Miles for counseling. She notes that she is been eating a lot of junk food at night and has gained 10 pounds since these people died in his seems to be the way she is trying to cope. She's also not sleeping more than 3-4 hours a night. She denies suicidal ideation or severe  depression but I think she needs something to help her restore her sleep and she agrees. She is trying to walk and stay active     Suicidal Ideation: No Plan Formed: No Patient has means to carry out plan: No  Homicidal Ideation: No Plan Formed: No Patient has means to carry out plan: No  Review of Systems: Psychiatric: Agitation: No Hallucination: No Depressed Mood: No Insomnia: Yes Hypersomnia: No Altered Concentration: No Feels Worthless: No Grandiose Ideas: No Belief In Special Powers: No New/Increased Substance Abuse: No Compulsions: No  Neurologic: Headache: Yes Seizure: No Paresthesias: No  Social History: Patient lives with her husband and children.  She has one son and 2 daughter.  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  She likes reading listing music walking and fishing.  Medical history Patient has multiple medical issues.  She has asthma, Crohn's disease, acid reflux, headaches, anemia, sleep apnea and arthritis.  Her primary care physician is Dr. Moshe Cipro.  Her neurologist is Dr Elpidio Galea and her gastroenterologist is Dr. Quincy Sheehan.   Alcohol and substance use history. Patient admitted using marijuana but claims to be sober since 2012. Outpatient Encounter Prescriptions as of 05/14/2014  Medication Sig  . Adalimumab (HUMIRA) 40 MG/0.8ML PSKT Inject 0.8 mLs (40 mg total) into the skin every 14 (fourteen) days.  . budesonide (ENTOCORT EC) 3 MG 24 hr capsule Take 3 capsules (9  mg total) by mouth daily. (Patient taking differently: Take 9 mg by mouth daily as needed (flare up). )  . buPROPion (WELLBUTRIN XL) 150 MG 24 hr tablet Take 1 tablet (150 mg total) by mouth 2 (two) times daily.  . cetirizine (ZYRTEC) 10 MG tablet Take 10 mg by mouth daily as needed for allergies.   . chlorpheniramine (CHLOR-TRIMETON) 4 MG tablet Take 4 mg by mouth 2 (two) times daily as needed for allergies.  . Cyanocobalamin (B-12) 1000 MCG CAPS Take 1 capsule by mouth daily.    . diphenhydrAMINE (BENADRYL)  25 mg capsule Take 25 mg by mouth daily as needed. For allergies   . fluticasone (FLONASE) 50 MCG/ACT nasal spray Place 2 sprays into both nostrils daily.  . hydrocortisone (ANUSOL-HC) 2.5 % rectal cream Place 1 application rectally 2 (two) times daily. (Patient taking differently: Place 1 application rectally 2 (two) times daily as needed. )  . Liniments (SALONPAS ARTHRITIS PAIN RELIEF) PADS Apply 1 each topically every 8 (eight) hours as needed. pain  . Multiple Vitamin (MULTIVITAMIN) tablet Take 1 tablet by mouth daily.  Marland Kitchen omeprazole (PRILOSEC) 20 MG capsule TAKE ONE CAPSULE BY MOUTH TWICE A DAY BEFORE A MEAL  . potassium chloride SA (K-DUR,KLOR-CON) 20 MEQ tablet TAKE ONE TABLET TWICE DAILY (Patient taking differently: 20 mEq 2 (two) times daily. )  . PROAIR HFA 108 (90 BASE) MCG/ACT inhaler USE TWO PUFFS EVERY SIX HOURS AS NEEDED FOR WHEEZING  . Probiotic Product (PROBIOTIC PO) Take 1 capsule by mouth daily.  . [DISCONTINUED] buPROPion (WELLBUTRIN XL) 150 MG 24 hr tablet Take 1 tablet (150 mg total) by mouth 2 (two) times daily.  Marland Kitchen acyclovir (ZOVIRAX) 400 MG tablet Take 1 tablet (400 mg total) by mouth 3 (three) times daily. (Patient not taking: Reported on 05/14/2014)  . traZODone (DESYREL) 50 MG tablet Take 1 tablet (50 mg total) by mouth at bedtime.    Past Psychiatric History/Hospitalization(s): Patient has multiple psychiatric hospitalizations in the past.  Her last psychiatric hospitalization in 2005 but she also admitted hospitalization in 2008 which we do not have details.  She has history of taking overdose on her medication .  She has history of depression and she was also diagnosed with bipolar disorder.  In the past she has taken Seroquel, Risperdal, Celexa, Lexapro, and Xanax.  She has seen psychologist in the past.  Anxiety: No Bipolar Disorder: Yes Depression: Yes Mania: Yes Psychosis: No Schizophrenia: No Personality Disorder: No Hospitalization for psychiatric illness:  Yes History of Electroconvulsive Shock Therapy: No Prior Suicide Attempts: No  Physical Exam: Constitutional:  BP 109/63 mmHg  Pulse 72  Ht 5' 7"  (1.702 m)  Wt 202 lb 12.8 oz (91.989 kg)  BMI 31.76 kg/m2  General Appearance: alert, oriented, no acute distress and well nourished  Musculoskeletal: Strength & Muscle Tone: within normal limits Gait & Station: normal Patient leans: N/A  Psychiatric: Speech (describe rate, volume, coherence, spontaneity, and abnormalities if any):  clear and coherent normal tone and volume.    Thought Process (describe rate, content, abstract reasoning, and computation):logical and goal-directed.    Associations: Coherent and Intact  Thoughts: normal  Mental Status: Orientation: oriented to person, place and time/date Mood & Affect: Mood is good but she is obviously sad about the recent deaths Attention Span & Concentration:  fair   Medical Decision Making (Choose Three): Established Problem, Stable/Improving (1), Review of Psycho-Social Stressors (1), Review or order clinical lab tests (1), Review and summation of old records (  2), Review of Last Therapy Session (1) and Review of Medication Regimen & Side Effects (2)  Assessment: Axis I: Maj. depressive disorder, recurrent  Axis II: deferred  Axis III:  Patient Active Problem List   Diagnosis Date Noted  . Grief reaction 04/28/2014  . Acute bronchitis 02/21/2014  . Fever blister 02/21/2014  . Iron deficiency anemia 08/28/2013  . Apnea, sleep 06/21/2012  . Obesity (BMI 30.0-34.9) 04/04/2012  . Bipolar disorder 04/03/2012  . Hx of nicotine dependence 11/14/2011  . Routine general medical examination at a health care facility 11/14/2011  . High risk for colon cancer 03/15/2011  . Leukocytosis 10/06/2010  . Herpes simplex virus (HSV) infection 08/10/2009  . ECHOCARDIOGRAM, ABNORMAL 01/11/2008  . HYPERGLYCEMIA 02/13/2007  . CROHN'S DISEASE 05/15/2006  . ANEMIA, B12 DEFICIENCY 12/29/2005   . ANXIETY 12/29/2005  . Depression, controlled 12/29/2005  . ALLERGIC RHINITIS 12/29/2005  . ASTHMA 12/29/2005  . GERD 12/29/2005  . PEPTIC ULCER DISEASE 12/29/2005  . HERNIORRHAPHY, HX OF 12/29/2005    Axis IV: mild to moderate  Axis V:  55-60   Plan:. Patient will continue Wellbutrin XL 150 mg twice a day. She'll start trazodone 50 mg daily at bedtime to help with sleep. She will continue her counseling with Dr. Jefm Miles I recommend to call us back if she is any question of  worsening of the symptom.  Otherwise I will see her in 4 weeks  Time spent 15 minutes.  More than 50% of the time spent and psychoeducation, counseling and coordination of care.    Levonne Spiller, MD 05/14/2014

## 2014-05-21 ENCOUNTER — Telehealth: Payer: Self-pay

## 2014-05-21 NOTE — Telephone Encounter (Signed)
Pt sent a message thru mychart. She is having R side abd pain and diarrhea. Pt has a hx of crohns and is currently on humira, probiotic and prilosec. She was doing good at her last ov in 11/2013. I spoke with AS- pt needs an office visit. Please schedule.

## 2014-05-21 NOTE — Telephone Encounter (Signed)
APPOINTMENT MADE AND PATIENT AWARE OF DATE AND TIME

## 2014-05-22 ENCOUNTER — Ambulatory Visit (HOSPITAL_COMMUNITY): Payer: Self-pay | Admitting: Psychology

## 2014-05-23 ENCOUNTER — Ambulatory Visit (INDEPENDENT_AMBULATORY_CARE_PROVIDER_SITE_OTHER): Payer: PRIVATE HEALTH INSURANCE | Admitting: Psychology

## 2014-05-23 DIAGNOSIS — F321 Major depressive disorder, single episode, moderate: Secondary | ICD-10-CM

## 2014-05-28 ENCOUNTER — Encounter: Payer: Self-pay | Admitting: Internal Medicine

## 2014-06-05 ENCOUNTER — Ambulatory Visit (INDEPENDENT_AMBULATORY_CARE_PROVIDER_SITE_OTHER): Payer: BLUE CROSS/BLUE SHIELD | Admitting: Nurse Practitioner

## 2014-06-05 ENCOUNTER — Encounter: Payer: Self-pay | Admitting: Nurse Practitioner

## 2014-06-05 VITALS — BP 116/76 | HR 89 | Temp 99.0°F | Ht 67.0 in | Wt 210.2 lb

## 2014-06-05 DIAGNOSIS — K50119 Crohn's disease of large intestine with unspecified complications: Secondary | ICD-10-CM
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
## 2014-06-05 NOTE — Progress Notes (Signed)
cc'ed to pcp °

## 2014-06-05 NOTE — Assessment & Plan Note (Signed)
44 year old female presents for routine follow-up on her Crohn's disease. Her symptoms are currently very well controlled on her regimen of Humira every 14 days as well as Entocort as needed. Recently had lab work done within the past month including CBC with white blood cell count and hemoglobin/hematocrit stable. Return to clinic for routine evaluation in 6 months including lab work. Notify us of any change in your signs or symptoms especially any signs of infection. No current red flag/warning signs.

## 2014-06-05 NOTE — Patient Instructions (Signed)
1. Return for re-evaluation in 6 months 2. Notify us if you have any problems or changes in your symptoms 3. You just had bloodwork done a month ago so there's no need to repeat it today.

## 2014-06-07 ENCOUNTER — Other Ambulatory Visit: Payer: Self-pay | Admitting: Family Medicine

## 2014-06-11 ENCOUNTER — Telehealth (HOSPITAL_COMMUNITY): Payer: Self-pay | Admitting: *Deleted

## 2014-06-11 ENCOUNTER — Ambulatory Visit (INDEPENDENT_AMBULATORY_CARE_PROVIDER_SITE_OTHER): Payer: PRIVATE HEALTH INSURANCE | Admitting: Psychiatry

## 2014-06-11 ENCOUNTER — Encounter (HOSPITAL_COMMUNITY): Payer: Self-pay | Admitting: Psychiatry

## 2014-06-11 VITALS — BP 109/71 | HR 74 | Ht 67.0 in | Wt 206.0 lb

## 2014-06-11 DIAGNOSIS — F321 Major depressive disorder, single episode, moderate: Secondary | ICD-10-CM

## 2014-06-11 DIAGNOSIS — F339 Major depressive disorder, recurrent, unspecified: Secondary | ICD-10-CM

## 2014-06-11 MED ORDER — BUPROPION HCL ER (XL) 150 MG PO TB24: 150.0000 mg | ORAL_TABLET | Freq: Two times a day (BID) | ORAL | Status: DC

## 2014-06-11 MED ORDER — TRAZODONE HCL 50 MG PO TABS: 50.0000 mg | ORAL_TABLET | Freq: Every day | ORAL | Status: DC

## 2014-06-11 NOTE — Progress Notes (Signed)
Patient ID: [name], female   DOB: 08-13-1970, 44 y.o.   MRN: [id number] Patient ID: [name], female   DOB: 1970/05/25, 44 y.o.   MRN: [id number] Patient ID: [name], female   DOB: 1970-05-06, 44 y.o.   MRN: [id number] Patient ID: [name], female   DOB: 1970-03-13, 44 y.o.   MRN: [id number] Patient ID: [name], female   DOB: 05/27/1970, 44 y.o.   MRN: [id number] Patient ID: [name], female   DOB: 1970/12/12, 44 y.o.   MRN: [id number] Patient ID: [name], female   DOB: [birth year], 44 y.o.   MRN: [id number]  [facility] Progress Note  [name] [id number] 44 y.o.  06/11/2014 10:55 AM  Chief Complaint: "I've been doing better  History of Present Illness:  This patient is a 44 year old black female who is married and lives with her 2 children and husband in [place]. She is on disability for Crohn's disease.  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 She got severely constipated with a high fever . Once it was determined what was wrong she's had an up-and-down course with her Crohn's. Currently is under good control because she has figured out which foods make it worse. The patient got depressed after developing Crohn's and was hospitalized twice at behavioral health hospital in the past. this was complicated by the fact that she was abusing marijuana.  The patient returns after 3 months. She's been doing better. Last time she had several deaths in the family and she wasn't sleeping well. We added trazodone and it has helped her sleep. Her mood is been good and she is very upbeat today. She is working out [employer] and getting more exercise     Suicidal Ideation: No Plan Formed: No Patient has means to carry out plan: No  Homicidal Ideation: No Plan Formed: No Patient has means to carry out plan: No  Review of Systems: Psychiatric: Agitation: No Hallucination: No Depressed  Mood: No Insomnia: Yes Hypersomnia: No Altered Concentration: No Feels Worthless: No Grandiose Ideas: No Belief In Special Powers: No New/Increased Substance Abuse: No Compulsions: No  Neurologic: Headache: Yes Seizure: No Paresthesias: No  Social History: Patient lives with her husband and children.  She has one son and 2 daughter.  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  She likes reading listing music walking and fishing.  Medical history Patient has multiple medical issues.  She has asthma, Crohn's disease, acid reflux, headaches, anemia, sleep apnea and arthritis.  Her primary care physician is Dr. Moshe Cipro.  Her neurologist is Dr Elpidio Galea and her gastroenterologist is Dr. Quincy Sheehan.   Alcohol and substance use history. Patient admitted using marijuana but claims to be sober since 2012. Outpatient Encounter Prescriptions as of 06/11/2014  Medication Sig  . Adalimumab (HUMIRA) 40 MG/0.8ML PSKT Inject 0.8 mLs (40 mg total) into the skin every 14 (fourteen) days.  . budesonide (ENTOCORT EC) 3 MG 24 hr capsule Take 3 capsules (9 mg total) by mouth daily. (Patient taking differently: Take 9 mg by mouth daily as needed (flare up). )  . buPROPion (WELLBUTRIN XL) 150 MG 24 hr tablet Take 1 tablet (150 mg total) by mouth 2 (two) times daily.  . cetirizine (ZYRTEC) 10 MG tablet Take 10 mg by mouth daily as needed for allergies.   . chlorpheniramine (CHLOR-TRIMETON) 4 MG tablet Take 4 mg by mouth 2 (two) times daily as needed  for allergies.  . Cyanocobalamin (B-12) 1000 MCG CAPS Take 1 capsule by mouth daily.    . diphenhydrAMINE (BENADRYL) 25 mg capsule Take 25 mg by mouth daily as needed. For allergies   . fluticasone (FLONASE) 50 MCG/ACT nasal spray USE 2 SPRAYS IN BOTH NOSTRILS DAILY  . hydrocortisone (ANUSOL-HC) 2.5 % rectal cream Place 1 application rectally 2 (two) times daily. (Patient taking differently: Place 1 application rectally 2 (two) times daily as needed. )  . Liniments (SALONPAS ARTHRITIS  PAIN RELIEF) PADS Apply 1 each topically every 8 (eight) hours as needed. pain  . Multiple Vitamin (MULTIVITAMIN) tablet Take 1 tablet by mouth daily.  Marland Kitchen omeprazole (PRILOSEC) 20 MG capsule TAKE ONE CAPSULE BY MOUTH TWICE A DAY BEFORE A MEAL  . potassium chloride SA (K-DUR,KLOR-CON) 20 MEQ tablet TAKE ONE TABLET TWICE DAILY (Patient taking differently: 20 mEq 2 (two) times daily. )  . PROAIR HFA 108 (90 BASE) MCG/ACT inhaler USE TWO PUFFS EVERY SIX HOURS AS NEEDED FOR WHEEZING  . Probiotic Product (PROBIOTIC PO) Take 1 capsule by mouth daily.  . traZODone (DESYREL) 50 MG tablet Take 1 tablet (50 mg total) by mouth at bedtime.  . [DISCONTINUED] buPROPion (WELLBUTRIN XL) 150 MG 24 hr tablet Take 1 tablet (150 mg total) by mouth 2 (two) times daily.  . [DISCONTINUED] traZODone (DESYREL) 50 MG tablet Take 1 tablet (50 mg total) by mouth at bedtime.  Marland Kitchen acyclovir (ZOVIRAX) 400 MG tablet TAKE ONE (1) TABLET BY MOUTH 3 TIMES DAILY (Patient not taking: Reported on 06/11/2014)    Past Psychiatric History/Hospitalization(s): Patient has multiple psychiatric hospitalizations in the past.  Her last psychiatric hospitalization in 2005 but she also admitted hospitalization in 2008 which we do not have details.  She has history of taking overdose on her medication .  She has history of depression and she was also diagnosed with bipolar disorder.  In the past she has taken Seroquel, Risperdal, Celexa, Lexapro, and Xanax.  She has seen psychologist in the past.  Anxiety: No Bipolar Disorder: Yes Depression: Yes Mania: Yes Psychosis: No Schizophrenia: No Personality Disorder: No Hospitalization for psychiatric illness: Yes History of Electroconvulsive Shock Therapy: No Prior Suicide Attempts: No  Physical Exam: Constitutional:  BP 109/71 mmHg  Pulse 74  Ht 5' 7"  (1.702 m)  Wt 206 lb (93.441 kg)  BMI 32.26 kg/m2  LMP 05/22/2014 (Approximate)  General Appearance: alert, oriented, no acute distress and  well nourished  Musculoskeletal: Strength & Muscle Tone: within normal limits Gait & Station: normal Patient leans: N/A  Psychiatric: Speech (describe rate, volume, coherence, spontaneity, and abnormalities if any):  clear and coherent normal tone and volume.    Thought Process (describe rate, content, abstract reasoning, and computation):logical and goal-directed.    Associations: Coherent and Intact  Thoughts: normal  Mental Status: Orientation: oriented to person, place and time/date Mood & Affect: Mood is good and affect is bright Attention Span & Concentration:  fair   Medical Decision Making (Choose Three): Established Problem, Stable/Improving (1), Review of Psycho-Social Stressors (1), Review or order clinical lab tests (1), Review and summation of old records (2), Review of Last Therapy Session (1) and Review of Medication Regimen & Side Effects (2)  Assessment: Axis I: Maj. depressive disorder, recurrent  Axis II: deferred  Axis III:  Patient Active Problem List   Diagnosis Date Noted  . Grief reaction 04/28/2014  . Acute bronchitis 02/21/2014  . Fever blister 02/21/2014  . Iron deficiency anemia 08/28/2013  . Apnea,  sleep 06/21/2012  . Obesity (BMI 30.0-34.9) 04/04/2012  . Bipolar disorder 04/03/2012  . Hx of nicotine dependence 11/14/2011  . Routine general medical examination at a health care facility 11/14/2011  . High risk for colon cancer 03/15/2011  . Leukocytosis 10/06/2010  . Herpes simplex virus (HSV) infection 08/10/2009  . ECHOCARDIOGRAM, ABNORMAL 01/11/2008  . HYPERGLYCEMIA 02/13/2007  . Crohn's colitis 05/15/2006  . ANEMIA, B12 DEFICIENCY 12/29/2005  . ANXIETY 12/29/2005  . Depression, controlled 12/29/2005  . ALLERGIC RHINITIS 12/29/2005  . ASTHMA 12/29/2005  . GERD 12/29/2005  . PEPTIC ULCER DISEASE 12/29/2005  . HERNIORRHAPHY, HX OF 12/29/2005    Axis IV: mild to moderate  Axis V:  55-60   Plan:. Patient will continue Wellbutrin  XL 150 mg twice a day and trazodone 50 mg daily at bedtime to help with sleep. She will continue her counseling with Dr. Jefm Miles I recommend to call us back if she is any question of  worsening of the symptom.  Otherwise I will see her in 4 months Time spent 15 minutes.  More than 50% of the time spent and psychoeducation, counseling and coordination of care.    Levonne Spiller, MD 06/11/2014

## 2014-06-11 NOTE — Telephone Encounter (Signed)
PHONE CALL FROM [name] REGARDING E-SCRIPT FOR WELLBUTRIN XL 150.   SHE HAS BEEN ON THE XR 150.    PHARMACY WANTS TO CONFIRM WHETHER OR NOT YOU WANT TO SWITCH THIS MEDICATION.   THE XR IS TWICE A DAY, THE XL IS ONCE A DAY.

## 2014-06-11 NOTE — Telephone Encounter (Signed)
Tell them to continue XR

## 2014-06-12 NOTE — Telephone Encounter (Signed)
Spoke with Tammy from pt pharmacy and informed her of what Dr. Harrington Challenger stated and she agreed to get medication ready for pt

## 2014-06-25 ENCOUNTER — Ambulatory Visit (HOSPITAL_COMMUNITY): Payer: Self-pay | Admitting: Psychology

## 2014-07-09 ENCOUNTER — Other Ambulatory Visit: Payer: Self-pay | Admitting: Family Medicine

## 2014-07-18 ENCOUNTER — Ambulatory Visit (HOSPITAL_COMMUNITY): Payer: Self-pay | Admitting: Psychology

## 2014-08-06 ENCOUNTER — Ambulatory Visit (INDEPENDENT_AMBULATORY_CARE_PROVIDER_SITE_OTHER): Payer: BLUE CROSS/BLUE SHIELD | Admitting: Psychology

## 2014-08-06 DIAGNOSIS — F321 Major depressive disorder, single episode, moderate: Secondary | ICD-10-CM

## 2014-08-12 ENCOUNTER — Other Ambulatory Visit: Payer: Self-pay | Admitting: Family Medicine

## 2014-08-14 NOTE — Progress Notes (Signed)
Patient:  [name]   DOB: 1970-08-18  MR Number: [id number]  Location: [facility] [address] [place] [address] Dept: [phone]  Start: 9 AM End: 10 AM  Provider/Observer:     Edgardo Roys PSYD  Chief Complaint:      Chief Complaint  Patient presents with  . Depression    Reason For Service:     The patient was seen in our office for many years and I last saw her in 2009. She was referred to our office because of significant symptoms of depression and sleep disturbance. She has had some inpatient hospitalizations and followup there are office. The patient has been diagnosed with Crohn's disease or reports at the present time that it is been very well managed and is quite pleased with this issue. She has dealt with recurrent major depressive episodes for years.  Interventions Strategy:  Cognitive/behavioral psychotherapeutic interventions  Participation Level:   Active  Participation Quality:  Appropriate      Behavioral Observation:  Well Groomed, Alert, and Appropriate.   Current Psychosocial Factors: The patient reports that family stress is reduced and social factors good.  The patient reports that she has been doing very well with regard to her Crohn's disease. She reports that her depression continues to improve.  Content of Session:   Review current symptoms and continue to work on therapeutic coping skills are in issues of stress related to family dysfunction.  Current Status:   The patient reports that her Crohn's disease has stayed in remission and she is doing well with GI.  The patient reports that depression has greatly improved and she is doing better.   Still needing to work on diet, exercise and sleep.  Patient Progress:   Stable  Target Goals:   Target goals include improving coping skills and strategies around this family issues that she is having to cope  with a regular basis. Also reducing the intensity, severity, and duration of major depressive events.  Last Reviewed:   04/24/2014  Goals Addressed Today:    Today we worked on coping skills are in issues of dealing with dysfunctional family members as well as issues of recurrent depression.  Impression/Diagnosis:   The patient has a long history of recurrent major depressive events as well as some anxiety and stress. She has been doing well over the past several years according to the patient and following the treatment recommendations. However, her depression has increased recently apparently do to family stressors.  Diagnosis:    Axis I: Major depressive disorder, single episode, moderate

## 2014-08-27 ENCOUNTER — Encounter (HOSPITAL_COMMUNITY): Payer: Self-pay | Admitting: Psychology

## 2014-08-27 NOTE — Progress Notes (Signed)
Patient:  [name]   DOB: 06-18-1970  MR Number: [id number]  Location: [facility] [address] [place] [address] Dept: [phone]  Start: 10 AM End: 11 AM  Provider/Observer:     Edgardo Roys PSYD  Chief Complaint:      Chief Complaint  Patient presents with  . Depression    Reason For Service:     The patient was seen in our office for many years and I last saw her in 2009. She was referred to our office because of significant symptoms of depression and sleep disturbance. She has had some inpatient hospitalizations and followup there are office. The patient has been diagnosed with Crohn's disease or reports at the present time that it is been very well managed and is quite pleased with this issue. She has dealt with recurrent major depressive episodes for years.  Interventions Strategy:  Cognitive/behavioral psychotherapeutic interventions  Participation Level:   Active  Participation Quality:  Appropriate      Behavioral Observation:  Well Groomed, Alert, and Appropriate.   Current Psychosocial Factors: The patient reports that family stress is reduced and social factors good.  The patient reports that she has been doing very well with regard to her Crohn's disease. She reports that her depression continues to improve.  Content of Session:   Review current symptoms and continue to work on therapeutic coping skills are in issues of stress related to family dysfunction.  Current Status:   The patient reports that her Crohn's disease has stayed in remission and she is doing well with GI.  The patient reports that depression has greatly improved and she is doing better.   Still needing to work on diet, exercise and sleep.  Patient Progress:   Stable  Target Goals:   Target goals include improving coping skills and strategies around this family issues that she is having to cope  with a regular basis. Also reducing the intensity, severity, and duration of major depressive events.  Last Reviewed:   05/23/2014  Goals Addressed Today:    Today we worked on coping skills are in issues of dealing with dysfunctional family members as well as issues of recurrent depression.  Impression/Diagnosis:   The patient has a long history of recurrent major depressive events as well as some anxiety and stress. She has been doing well over the past several years according to the patient and following the treatment recommendations. However, her depression has increased recently apparently do to family stressors.  Diagnosis:    Axis I: Major depressive disorder, single episode, moderate

## 2014-08-28 ENCOUNTER — Encounter (HOSPITAL_COMMUNITY): Payer: BLUE CROSS/BLUE SHIELD | Attending: Oncology | Admitting: Oncology

## 2014-08-28 ENCOUNTER — Encounter (HOSPITAL_COMMUNITY): Payer: Self-pay | Admitting: Oncology

## 2014-08-28 ENCOUNTER — Ambulatory Visit (HOSPITAL_COMMUNITY): Payer: Self-pay | Admitting: Oncology

## 2014-08-28 ENCOUNTER — Encounter (HOSPITAL_COMMUNITY): Payer: BLUE CROSS/BLUE SHIELD

## 2014-08-28 ENCOUNTER — Other Ambulatory Visit (HOSPITAL_COMMUNITY): Payer: Self-pay | Admitting: Oncology

## 2014-08-28 ENCOUNTER — Other Ambulatory Visit (HOSPITAL_COMMUNITY): Payer: Self-pay

## 2014-08-28 ENCOUNTER — Other Ambulatory Visit: Payer: Self-pay | Admitting: Family Medicine

## 2014-08-28 VITALS — BP 133/70 | HR 70 | Temp 98.2°F | Resp 18 | Wt 210.0 lb

## 2014-08-28 DIAGNOSIS — D509 Iron deficiency anemia, unspecified: Secondary | ICD-10-CM

## 2014-08-28 DIAGNOSIS — Z1231 Encounter for screening mammogram for malignant neoplasm of breast: Secondary | ICD-10-CM

## 2014-08-28 DIAGNOSIS — D72829 Elevated white blood cell count, unspecified: Secondary | ICD-10-CM

## 2014-08-28 DIAGNOSIS — K529 Noninfective gastroenteritis and colitis, unspecified: Secondary | ICD-10-CM

## 2014-08-28 LAB — COMPREHENSIVE METABOLIC PANEL
ALBUMIN: 3.3 g/dL — AB (ref 3.5–5.0)
ALT: 26 U/L (ref 14–54)
AST: 16 U/L (ref 15–41)
Alkaline Phosphatase: 77 U/L (ref 38–126)
Anion gap: 8 (ref 5–15)
BUN: 8 mg/dL (ref 6–20)
CALCIUM: 8.5 mg/dL — AB (ref 8.9–10.3)
CO2: 25 mmol/L (ref 22–32)
CREATININE: 0.69 mg/dL (ref 0.44–1.00)
Chloride: 104 mmol/L (ref 101–111)
GFR calc Af Amer: >60 mL/min (ref >60)
GFR calc non Af Amer: >60 mL/min (ref >60)
Glucose, Bld: 89 mg/dL (ref 65–99)
Potassium: 3.6 mmol/L (ref 3.5–5.1)
SODIUM: 137 mmol/L (ref 135–145)
Total Bilirubin: 0.3 mg/dL (ref 0.3–1.2)
Total Protein: 7 g/dL (ref 6.5–8.1)

## 2014-08-28 LAB — CBC WITH DIFFERENTIAL/PLATELET
Basophils Absolute: 0 10*3/uL (ref 0.0–0.1)
Basophils Relative: 0 % (ref 0–1)
EOS ABS: 0.6 10*3/uL (ref 0.0–0.7)
Eosinophils Relative: 5 % (ref 0–5)
HCT: 35.6 % — ABNORMAL LOW (ref 36.0–46.0)
Hemoglobin: 11.8 g/dL — ABNORMAL LOW (ref 12.0–15.0)
LYMPHS ABS: 5 10*3/uL — AB (ref 0.7–4.0)
Lymphocytes Relative: 42 % (ref 12–46)
MCH: 28 pg (ref 26.0–34.0)
MCHC: 33.1 g/dL (ref 30.0–36.0)
MCV: 84.4 fL (ref 78.0–100.0)
MONOS PCT: 7 % (ref 3–12)
Monocytes Absolute: 0.8 10*3/uL (ref 0.1–1.0)
NEUTROS ABS: 5.5 10*3/uL (ref 1.7–7.7)
Neutrophils Relative %: 46 % (ref 43–77)
PLATELETS: 339 10*3/uL (ref 150–400)
RBC: 4.22 MIL/uL (ref 3.87–5.11)
RDW: 14.8 % (ref 11.5–15.5)
WBC: 11.9 10*3/uL — ABNORMAL HIGH (ref 4.0–10.5)

## 2014-08-28 LAB — IRON AND TIBC
Iron: 94 ug/dL (ref 28–170)
Saturation Ratios: 30 % (ref 10.4–31.8)
TIBC: 318 ug/dL (ref 250–450)
UIBC: 224 ug/dL

## 2014-08-28 LAB — FERRITIN: Ferritin: 80 ng/mL (ref 11–307)

## 2014-08-28 MED ORDER — POLYSACCHARIDE IRON COMPLEX 150 MG PO CAPS: 150.0000 mg | ORAL_CAPSULE | Freq: Every day | ORAL | Status: DC

## 2014-08-28 NOTE — Assessment & Plan Note (Addendum)
Secondary to Crohn's Disease.  S/P IV Feraheme 510 mg on 08/30/2013 and 09/10/2014.  Labs today and in 6 and 12 months: CBC diff, iron/TIBC, ferritin.  Will start Niferex 1 tablet daily.    Return in 12 months for follow-up.  I have escribed Niferex daily in an attempt to avoid future IV iron infusions.  If effective, then I think we could consider releasing the patient from the clinic.  If ineffective, we will continue with IV iron support.
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
## 2014-08-28 NOTE — Patient Instructions (Signed)
..  [place] at [facility] Discharge Instructions  RECOMMENDATIONS MADE BY THE CONSULTANT AND ANY TEST RESULTS WILL BE SENT TO YOUR REFERRING PHYSICIAN.  Labs in 6 months and in 12 months. Try over the counter headache medicine and if that is ineffective call Dr. Moshe Cipro. Return in 1 year for follow up visit.     Thank you for choosing [place] at [facility] to provide your oncology and hematology care.  To afford each patient quality time with our provider, please arrive at least 15 minutes before your scheduled appointment time.    You need to re-schedule your appointment should you arrive 10 or more minutes late.  We strive to give you quality time with our providers, and arriving late affects you and other patients whose appointments are after yours.  Also, if you no show three or more times for appointments you may be dismissed from the clinic at the providers discretion.     Again, thank you for choosing [facility].  Our hope is that these requests will decrease the amount of time that you wait before being seen by our physicians.       _____________________________________________________________  Should you have questions after your visit to [facility], please contact our office at [phone] between the hours of 8:30 a.m. and 4:30 p.m.  Voicemails left after 4:30 p.m. will not be returned until the following business day.  For prescription refill requests, have your pharmacy contact our office.

## 2014-08-28 NOTE — Assessment & Plan Note (Addendum)
Leukocytosis, secondary to Crohn's disease and/or lung disease due to tobacco abuse. Negative flow cytometry, negative multiple myeloma panel. Crohn's disease, on Humira, in remission. Managed by Dr. Gala Romney. Tobacco abuse, quit, March 2014. Port-a-cath due to poor veinous access.  Labs in 6 and 12 months: CBC diff.  Return in 12 months for follow-up.  Patient reports a right frontal headache that has been ongoing for 2 days.  I have recommended OTC headache medication and if this does not abort it, she should contact her primary care provider.

## 2014-09-03 ENCOUNTER — Encounter: Payer: Self-pay | Admitting: Family Medicine

## 2014-09-04 ENCOUNTER — Other Ambulatory Visit: Payer: Self-pay

## 2014-09-04 ENCOUNTER — Encounter: Payer: Self-pay | Admitting: Family Medicine

## 2014-09-04 MED ORDER — ACYCLOVIR 400 MG PO TABS: ORAL_TABLET | ORAL | Status: DC

## 2014-09-05 ENCOUNTER — Ambulatory Visit (HOSPITAL_COMMUNITY): Payer: Self-pay | Admitting: Psychology

## 2014-09-09 ENCOUNTER — Encounter: Payer: Self-pay | Admitting: Family Medicine

## 2014-09-17 ENCOUNTER — Ambulatory Visit (HOSPITAL_COMMUNITY): Payer: Self-pay

## 2014-09-18 ENCOUNTER — Other Ambulatory Visit: Payer: Self-pay | Admitting: Gastroenterology

## 2014-09-22 ENCOUNTER — Ambulatory Visit (HOSPITAL_COMMUNITY)
Admission: RE | Admit: 2014-09-22 | Discharge: 2014-09-22 | Disposition: A | Discharge status: Home / Self Care | Payer: BLUE CROSS/BLUE SHIELD | Source: Ambulatory Visit | Attending: Family Medicine | Admitting: Family Medicine

## 2014-09-22 DIAGNOSIS — Z1231 Encounter for screening mammogram for malignant neoplasm of breast: Secondary | ICD-10-CM | POA: Diagnosis not present

## 2014-09-24 ENCOUNTER — Encounter (HOSPITAL_COMMUNITY): Payer: Self-pay

## 2014-09-24 ENCOUNTER — Encounter (HOSPITAL_COMMUNITY): Payer: Medicare Other | Attending: Oncology

## 2014-09-24 VITALS — BP 119/67 | HR 76 | Temp 98.4°F | Resp 16

## 2014-09-24 DIAGNOSIS — D72829 Elevated white blood cell count, unspecified: Secondary | ICD-10-CM | POA: Insufficient documentation

## 2014-09-24 DIAGNOSIS — D509 Iron deficiency anemia, unspecified: Secondary | ICD-10-CM | POA: Diagnosis not present

## 2014-09-24 MED ORDER — SODIUM CHLORIDE 0.9 % IV SOLN
Freq: Once | INTRAVENOUS | Status: AC
  Administered 2014-09-24: 14:00:00 via INTRAVENOUS

## 2014-09-24 MED ORDER — SODIUM CHLORIDE 0.9 % IV SOLN
125.0000 mg | Freq: Once | INTRAVENOUS | Status: AC
  Administered 2014-09-24: 125 mg via INTRAVENOUS
  Filled 2014-09-24: qty 10

## 2014-09-24 MED ORDER — SODIUM CHLORIDE 0.9 % IJ SOLN: 10.0000 mL | Freq: Once | INTRAMUSCULAR | Status: DC

## 2014-09-24 NOTE — Progress Notes (Signed)
Tolerated well

## 2014-09-26 ENCOUNTER — Ambulatory Visit (HOSPITAL_COMMUNITY): Payer: Self-pay | Admitting: Psychology

## 2014-10-01 ENCOUNTER — Encounter (HOSPITAL_BASED_OUTPATIENT_CLINIC_OR_DEPARTMENT_OTHER): Payer: Medicare Other

## 2014-10-01 VITALS — BP 114/73 | HR 83 | Temp 98.3°F | Resp 16

## 2014-10-01 DIAGNOSIS — D509 Iron deficiency anemia, unspecified: Secondary | ICD-10-CM

## 2014-10-01 MED ORDER — SODIUM CHLORIDE 0.9 % IV SOLN
125.0000 mg | Freq: Once | INTRAVENOUS | Status: AC
  Administered 2014-10-01: 125 mg via INTRAVENOUS
  Filled 2014-10-01: qty 10

## 2014-10-01 NOTE — Progress Notes (Signed)
Patient tolerated infusion well.  VSS 30 minutes post infusion.

## 2014-10-10 ENCOUNTER — Ambulatory Visit (HOSPITAL_COMMUNITY): Payer: Self-pay | Admitting: Psychiatry

## 2014-10-21 ENCOUNTER — Other Ambulatory Visit: Payer: Self-pay | Admitting: Family Medicine

## 2014-10-22 DIAGNOSIS — E559 Vitamin D deficiency, unspecified: Secondary | ICD-10-CM | POA: Diagnosis not present

## 2014-10-22 DIAGNOSIS — Z79899 Other long term (current) drug therapy: Secondary | ICD-10-CM | POA: Diagnosis not present

## 2014-10-28 ENCOUNTER — Ambulatory Visit (INDEPENDENT_AMBULATORY_CARE_PROVIDER_SITE_OTHER): Payer: BLUE CROSS/BLUE SHIELD | Admitting: Family Medicine

## 2014-10-28 ENCOUNTER — Encounter: Payer: Self-pay | Admitting: Family Medicine

## 2014-10-28 ENCOUNTER — Other Ambulatory Visit (HOSPITAL_COMMUNITY)
Admission: RE | Admit: 2014-10-28 | Discharge: 2014-10-28 | Disposition: A | Discharge status: Home / Self Care | Payer: BLUE CROSS/BLUE SHIELD | Source: Ambulatory Visit | Attending: Family Medicine | Admitting: Family Medicine

## 2014-10-28 VITALS — BP 112/70 | HR 80 | Resp 16 | Ht 67.0 in | Wt 208.0 lb

## 2014-10-28 DIAGNOSIS — Z01419 Encounter for gynecological examination (general) (routine) without abnormal findings: Secondary | ICD-10-CM | POA: Insufficient documentation

## 2014-10-28 DIAGNOSIS — Z0000 Encounter for general adult medical examination without abnormal findings: Secondary | ICD-10-CM | POA: Insufficient documentation

## 2014-10-28 DIAGNOSIS — Z1211 Encounter for screening for malignant neoplasm of colon: Secondary | ICD-10-CM | POA: Diagnosis not present

## 2014-10-28 DIAGNOSIS — Z1151 Encounter for screening for human papillomavirus (HPV): Secondary | ICD-10-CM | POA: Diagnosis present

## 2014-10-28 DIAGNOSIS — Z124 Encounter for screening for malignant neoplasm of cervix: Secondary | ICD-10-CM | POA: Diagnosis not present

## 2014-10-28 LAB — TSH: TSH: 1.296 u[IU]/mL (ref 0.350–4.500)

## 2014-10-28 LAB — LIPID PANEL
Cholesterol: 153 mg/dL (ref 125–200)
HDL: 57 mg/dL (ref >=46)
LDL Cholesterol: 67 mg/dL (ref <130)
Total CHOL/HDL Ratio: 2.7 Ratio (ref <=5.0)
Triglycerides: 143 mg/dL (ref <150)
VLDL: 29 mg/dL (ref <30)

## 2014-10-28 LAB — POC HEMOCCULT BLD/STL (OFFICE/1-CARD/DIAGNOSTIC): FECAL OCCULT BLD: NEGATIVE

## 2014-10-28 NOTE — Patient Instructions (Signed)
F/u in 5 month, call if you need me before  Fasting lipid, TSH and Vit D  Come for flu vaccine in Septemer/ October pls  Please work on good  health habits so that your health will improve. 1. Commitment to daily physical activity for 30 to 60  minutes, if you are able to do this.  2. Commitment to wise food choices. Aim for half of your  food intake to be vegetable and fruit, one quarter starchy foods, and one quarter protein. Try to eat on a regular schedule  3 meals per day, snacking between meals should be limited to vegetables or fruits or small portions of nuts. 64 ounces of water per day is generally recommended, unless you have specific health conditions, like heart failure or kidney failure where you will need to limit fluid intake.  3. Commitment to sufficient and a  good quality of physical and mental rest daily, generally between 6 to 8 hours per day.  WITH PERSISTANCE AND PERSEVERANCE, THE IMPOSSIBLE , BECOMES THE NORM!   Weight loss goal of 1 to 2 pounds per month  Thanks for choosing [facility], we consider it a privelige to serve you.

## 2014-10-29 LAB — VITAMIN D 25 HYDROXY (VIT D DEFICIENCY, FRACTURES): VIT D 25 HYDROXY: 27 ng/mL — AB (ref 30–100)

## 2014-10-29 LAB — CYTOLOGY - PAP

## 2014-11-06 ENCOUNTER — Ambulatory Visit (HOSPITAL_COMMUNITY): Payer: Self-pay | Admitting: Psychiatry

## 2014-11-10 NOTE — Progress Notes (Signed)
   Subjective:    Patient ID: [name], female    DOB: [birth year], 44 y.o.   MRN: [id number]  HPI Patient is in for annual physical exam. No other health concerns are expressed or addressed at the visit. Recent labs, if available are reviewed. Immunization is reviewed , and  updated if needed.    Review of Systems See HPI   Objective:   Physical Exam BP 112/70 mmHg  Pulse 80  Resp 16  Ht 5' 7"  (1.702 m)  Wt 208 lb (94.348 kg)  BMI 32.57 kg/m2  SpO2 100%  LMP 10/07/2014 Pleasant well nourished female, alert and oriented x 3, in no cardio-pulmonary distress. Afebrile. HEENT No facial trauma or asymetry. Sinuses non tender.  Extra occullar muscles intact, pupils equally reactive to light. External ears normal, tympanic membranes clear. Oropharynx moist, no exudate, good dentition. Neck: supple, no adenopathy,JVD or thyromegaly.No bruits.  Chest: Clear to ascultation bilaterally.No crackles or wheezes. Non tender to palpation  Breast: No asymetry,no masses or lumps. No tenderness. No nipple discharge or inversion. No axillary or supraclavicular adenopathy  Cardiovascular system; Heart sounds normal,  S1 and  S2 ,no S3.  No murmur, or thrill. Apical beat not displaced Peripheral pulses normal.  Abdomen: Soft, non tender, no organomegaly or masses. No bruits. Bowel sounds normal. No guarding, tenderness or rebound.  Rectal:  Normal sphincter tone. No mass.No rectal masses.  Guaiac negative stool.  GU: External genitalia normal female genitalia , female distribution of hair. No lesions. Urethral meatus normal in size, no  Prolapse, no lesions visibly  Present. Bladder non tender. Vagina pink and moist , with no visible lesions , discharge present . Adequate pelvic support no  cystocele or rectocele noted Cervix pink and appears healthy, no lesions or ulcerations noted, no discharge noted from os Uterus normal size, no adnexal masses, no cervical motion  or adnexal tenderness.   Musculoskeletal exam: Full ROM of spine, hips , shoulders and knees. No deformity ,swelling or crepitus noted. No muscle wasting or atrophy.   Neurologic: Cranial nerves 2 to 12 intact. Power, tone ,sensation and reflexes normal throughout. No disturbance in gait. No tremor.  Skin: Intact, no ulceration, erythema , scaling or rash noted. Pigmentation normal throughout  Psych; Normal mood and affect. Judgement and concentration normal        Assessment & Plan:   Annual physical exam Annual exam as documented. Counseling done  re healthy lifestyle involving commitment to 150 minutes exercise per week, heart healthy diet, and attaining healthy weight.The importance of adequate sleep also discussed. Regular seat belt use and home safety, is also discussed. Changes in health habits are decided on by the patient with goals and time frames  set for achieving them. Immunization and cancer screening needs are specifically addressed at this visit.

## 2014-11-10 NOTE — Assessment & Plan Note (Signed)

## 2014-11-12 ENCOUNTER — Encounter: Payer: Self-pay | Admitting: Family Medicine

## 2014-11-12 ENCOUNTER — Encounter: Payer: Self-pay | Admitting: Nurse Practitioner

## 2014-11-13 ENCOUNTER — Encounter (HOSPITAL_COMMUNITY): Payer: Self-pay | Admitting: Psychology

## 2014-11-13 NOTE — Progress Notes (Signed)
Patient:  [name]   DOB: 1971-03-04  MR Number: [id number]  Location: [facility] [address] [place] [address] Dept: [phone]  Start: 10 AM End: 11 AM  Provider/Observer:     Edgardo Roys PSYD  Chief Complaint:      Chief Complaint  Patient presents with  . Depression    Reason For Service:     The patient was seen in our office for many years and I last saw her in 2009. She was referred to our office because of significant symptoms of depression and sleep disturbance. She has had some inpatient hospitalizations and followup there are office. The patient has been diagnosed with Crohn's disease or reports at the present time that it is been very well managed and is quite pleased with this issue. She has dealt with recurrent major depressive episodes for years.  Interventions Strategy:  Cognitive/behavioral psychotherapeutic interventions  Participation Level:   Active  Participation Quality:  Appropriate      Behavioral Observation:  Well Groomed, Alert, and Appropriate.   Current Psychosocial Factors: The patient reports that she has done much better recently and reports that the medication for Crohn's disease has been working relatively well. The patient reports that she has had some stressors around family issues but overall she is quite pleased and is been actively working on coping skills now that she is not having to deal with all of the GI dysfunction that she was doing.  Content of Session:   Review current symptoms and continue to work on therapeutic coping skills are in issues of stress related to family dysfunction.  Current Status:   The patient reports that her Crohn's disease has stayed in remission and she is doing well with GI.  The patient reports that depression has greatly improved and she is doing better.   Still needing to work on diet, exercise and  sleep.  Patient Progress:   Stable  Target Goals:   Target goals include improving coping skills and strategies around this family issues that she is having to cope with a regular basis. Also reducing the intensity, severity, and duration of major depressive events.  Last Reviewed:   08/06/2014  Goals Addressed Today:    Today we worked on coping skills are in issues of dealing with dysfunctional family members as well as issues of recurrent depression.  Impression/Diagnosis:   The patient has a long history of recurrent major depressive events as well as some anxiety and stress. She has been doing well over the past several years according to the patient and following the treatment recommendations. However, her depression has increased recently apparently do to family stressors.  Diagnosis:    Axis I: Major depressive disorder, single episode, moderate

## 2014-11-14 ENCOUNTER — Encounter (HOSPITAL_COMMUNITY): Payer: Self-pay | Admitting: Psychiatry

## 2014-11-14 ENCOUNTER — Ambulatory Visit (INDEPENDENT_AMBULATORY_CARE_PROVIDER_SITE_OTHER): Payer: PRIVATE HEALTH INSURANCE | Admitting: Psychiatry

## 2014-11-14 ENCOUNTER — Telehealth (HOSPITAL_COMMUNITY): Payer: Self-pay | Admitting: *Deleted

## 2014-11-14 VITALS — BP 119/64 | HR 83 | Ht 67.0 in | Wt 212.4 lb

## 2014-11-14 DIAGNOSIS — F321 Major depressive disorder, single episode, moderate: Secondary | ICD-10-CM

## 2014-11-14 DIAGNOSIS — F339 Major depressive disorder, recurrent, unspecified: Secondary | ICD-10-CM | POA: Diagnosis not present

## 2014-11-14 MED ORDER — BUPROPION HCL ER (XL) 150 MG PO TB24
150.0000 mg | ORAL_TABLET | Freq: Two times a day (BID) | ORAL | Status: DC
Start: 2014-11-14 — End: 2015-03-24

## 2014-11-14 MED ORDER — TRAZODONE HCL 100 MG PO TABS: 100.0000 mg | ORAL_TABLET | Freq: Every day | ORAL | Status: DC

## 2014-11-14 NOTE — Progress Notes (Signed)
Patient ID: [name], female   DOB: 1970-12-12, 44 y.o.   MRN: [id number] Patient ID: [name], female   DOB: 06/25/1970, 44 y.o.   MRN: [id number] Patient ID: [name], female   DOB: April 12, 1970, 44 y.o.   MRN: [id number] Patient ID: [name], female   DOB: 1970/08/01, 44 y.o.   MRN: [id number] Patient ID: [name], female   DOB: [birth year], 44 y.o.   MRN: [id number] Patient ID: [name], female   DOB: Jul 12, 1970, 44 y.o.   MRN: [id number] Patient ID: [name], female   DOB: 08/28/1970, 44 y.o.   MRN: [id number] Patient ID: [name], female   DOB: 1970-11-06, 44 y.o.   MRN: [id number]  [place] Progress Note  LYLY CANIZALES [id number] 44 y.o.  11/14/2014 1:43 PM  Chief Complaint: "I've been doing better  History of Present Illness:  This patient is a 44 year old black female who is married and lives with her 2 children and husband in [place]. She is on disability for Crohn's disease.  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 She got severely constipated with a high fever . Once it was determined what was wrong she's had an up-and-down course with her Crohn's. Currently is under good control because she has figured out which foods make it worse. The patient got depressed after developing Crohn's and was hospitalized twice at behavioral health hospital in the past. this was complicated by the fact that she was abusing marijuana.  The patient returns after 4 months. She states that at the end of 2022-11-24 her father-in-law died after a bout with  Cancer. Shortly thereafter she started classes at [employer] for business. She is getting reused to studying since she hasn't been in school for a number of years. Her mood is been stable but she's had a bit more difficulty sleeping and had to increase her trazodone to 100 mg. She denies being depressed is very upbeat and excited about school today. Her  Crohn's disease is under good control    Suicidal Ideation: No Plan Formed: No Patient has means to carry out plan: No  Homicidal Ideation: No Plan Formed: No Patient has means to carry out plan: No  Review of Systems: Psychiatric: Agitation: No Hallucination: No Depressed Mood: No Insomnia: Yes Hypersomnia: No Altered Concentration: No Feels Worthless: No Grandiose Ideas: No Belief In Special Powers: No New/Increased Substance Abuse: No Compulsions: No  Neurologic: Headache: Yes Seizure: No Paresthesias: No  Social History: Patient lives with her husband and children.  She has one son and 2 daughter.  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  She likes reading listing music walking and fishing.  Medical history Patient has multiple medical issues.  She has asthma, Crohn's disease, acid reflux, headaches, anemia, sleep apnea and arthritis.  Her primary care physician is Dr. Moshe Cipro.  Her neurologist is Dr Elpidio Galea and her gastroenterologist is Dr. Quincy Sheehan.   Alcohol and substance use history. Patient admitted using marijuana but claims to be sober since 2012. Outpatient Encounter Prescriptions as of 11/14/2014  Medication Sig  . Adalimumab (HUMIRA) 40 MG/0.8ML PSKT Inject 0.8 mLs (40 mg total) into the skin every 14 (fourteen) days.  . budesonide (ENTOCORT EC) 3 MG 24 hr capsule Take 3 capsules (9 mg total) by mouth daily. (Patient taking differently: Take 9 mg by mouth as needed. )  . buPROPion (WELLBUTRIN XL) 150 MG 24 hr tablet  Take 1 tablet (150 mg total) by mouth 2 (two) times daily.  . cetirizine (ZYRTEC) 10 MG tablet Take 10 mg by mouth daily as needed for allergies.   . chlorpheniramine (CHLOR-TRIMETON) 4 MG tablet Take 4 mg by mouth 2 (two) times daily as needed for allergies.  . Cholecalciferol (VITAMIN D-3 PO) Take 1 tablet by mouth daily.  . Cyanocobalamin (B-12) 1000 MCG CAPS Take 1 capsule by mouth daily.    . diphenhydrAMINE (BENADRYL) 25 mg capsule Take 25 mg by mouth daily  as needed. For allergies   . fluticasone (FLONASE) 50 MCG/ACT nasal spray USE TWO SPRAYS INTO EACH NOSTRIL ONCE DAILY  . hydrocortisone (ANUSOL-HC) 2.5 % rectal cream Place 1 application rectally 2 (two) times daily. (Patient taking differently: Place 1 application rectally 2 (two) times daily as needed. )  . iron polysaccharides (NIFEREX) 150 MG capsule Take 1 capsule (150 mg total) by mouth daily.  . Liniments (SALONPAS ARTHRITIS PAIN RELIEF) PADS Apply 1 each topically every 8 (eight) hours as needed. pain  . Multiple Vitamin (MULTIVITAMIN) tablet Take 1 tablet by mouth daily.  . Omega-3 Fatty Acids (FISH OIL) 1200 MG CAPS Take 1,200 mg by mouth 2 (two) times daily.  Marland Kitchen omeprazole (PRILOSEC) 20 MG capsule TAKE ONE CAPSULE BY MOUTH TWICE A DAY BEFORE A MEAL  . potassium chloride SA (K-DUR,KLOR-CON) 20 MEQ tablet TAKE ONE TABLET BY MOUTH TWICE A DAY  . PROAIR HFA 108 (90 BASE) MCG/ACT inhaler USE TWO PUFFS EVERY SIX HOURS AS NEEDED FOR WHEEZING  . Probiotic Product (PROBIOTIC PO) Take 1 capsule by mouth daily.  . [DISCONTINUED] buPROPion (WELLBUTRIN XL) 150 MG 24 hr tablet Take 1 tablet (150 mg total) by mouth 2 (two) times daily.  . [DISCONTINUED] traZODone (DESYREL) 50 MG tablet Take 1 tablet (50 mg total) by mouth at bedtime.  . traZODone (DESYREL) 100 MG tablet Take 1 tablet (100 mg total) by mouth at bedtime.  . [DISCONTINUED] acyclovir (ZOVIRAX) 400 MG tablet TAKE ONE (1) TABLET BY MOUTH 3 TIMES DAILY (Patient not taking: Reported on 11/14/2014)   No facility-administered encounter medications on file as of 11/14/2014.    Past Psychiatric History/Hospitalization(s): Patient has multiple psychiatric hospitalizations in the past.  Her last psychiatric hospitalization in 2005 but she also admitted hospitalization in 2008 which we do not have details.  She has history of taking overdose on her medication .  She has history of depression and she was also diagnosed with bipolar disorder.  In the past  she has taken Seroquel, Risperdal, Celexa, Lexapro, and Xanax.  She has seen psychologist in the past.  Anxiety: No Bipolar Disorder: Yes Depression: Yes Mania: Yes Psychosis: No Schizophrenia: No Personality Disorder: No Hospitalization for psychiatric illness: Yes History of Electroconvulsive Shock Therapy: No Prior Suicide Attempts: No  Physical Exam: Constitutional:  BP 119/64 mmHg  Pulse 83  Ht 5' 7"  (1.702 m)  Wt 212 lb 6.4 oz (96.344 kg)  BMI 33.26 kg/m2  LMP 10/07/2014  General Appearance: alert, oriented, no acute distress and well nourished  Musculoskeletal: Strength & Muscle Tone: within normal limits Gait & Station: normal Patient leans: N/A  Psychiatric: Speech (describe rate, volume, coherence, spontaneity, and abnormalities if any):  clear and coherent normal tone and volume.    Thought Process (describe rate, content, abstract reasoning, and computation):logical and goal-directed.    Associations: Coherent and Intact  Thoughts: normal  Mental Status: Orientation: oriented to person, place and time/date Mood & Affect: Mood is good and  affect is bright Attention Span & Concentration:  fair   Medical Decision Making (Choose Three): Established Problem, Stable/Improving (1), Review of Psycho-Social Stressors (1), Review or order clinical lab tests (1), Review and summation of old records (2), Review of Last Therapy Session (1) and Review of Medication Regimen & Side Effects (2)  Assessment: Axis I: Maj. depressive disorder, recurrent  Axis II: deferred  Axis III:  Patient Active Problem List   Diagnosis Date Noted  . Annual physical exam 10/28/2014  . Fever blister 02/21/2014  . Iron deficiency anemia 08/28/2013  . Apnea, sleep 06/21/2012  . Obesity (BMI 30.0-34.9) 04/04/2012  . Bipolar disorder 04/03/2012  . Hx of nicotine dependence 11/14/2011  . High risk for colon cancer 03/15/2011  . Leukocytosis 10/06/2010  . ECHOCARDIOGRAM, ABNORMAL  01/11/2008  . HYPERGLYCEMIA 02/13/2007  . Crohn's colitis 05/15/2006  . ANEMIA, B12 DEFICIENCY 12/29/2005  . ANXIETY 12/29/2005  . Depression, controlled 12/29/2005  . ALLERGIC RHINITIS 12/29/2005  . ASTHMA 12/29/2005  . GERD 12/29/2005  . PEPTIC ULCER DISEASE 12/29/2005    Axis IV: mild to moderate  Axis V:  55-60   Plan:. Patient will continue Wellbutrin XL 150 mg twice a day for depression and increase trazodone to 100 mg daily at bedtime to help with sleep. She will continue her counseling with Dr. Jefm Miles I recommend to call us back if she is any question of  worsening of the symptom.  Otherwise I will see her in 4 months Time spent 15 minutes.  More than 50% of the time spent and psychoeducation, counseling and coordination of care.    Levonne Spiller, MD 11/14/2014

## 2014-11-14 NOTE — Telephone Encounter (Signed)
phone call from [facility], Trazodone 100 mg.  She has been on 50.

## 2014-11-14 NOTE — Telephone Encounter (Signed)
Per notes on file, Dr. Harrington Challenger increased pt to 100 mg of Trazodone. Informed Jonni Sanger and he showed understanding

## 2014-11-14 NOTE — Telephone Encounter (Signed)
Per previous notes, pt have be on the XR and Dr. Harrington Challenger did approve for pt to stay on that. Informed [name] and he agreed.

## 2014-11-14 NOTE — Telephone Encounter (Signed)
Andy Wellbutrin XL 188m.  been on the SR.

## 2014-11-26 ENCOUNTER — Encounter: Payer: Self-pay | Admitting: Family Medicine

## 2014-12-04 ENCOUNTER — Ambulatory Visit: Payer: Self-pay | Admitting: Nurse Practitioner

## 2014-12-09 ENCOUNTER — Encounter: Payer: Self-pay | Admitting: Nurse Practitioner

## 2014-12-09 ENCOUNTER — Ambulatory Visit (INDEPENDENT_AMBULATORY_CARE_PROVIDER_SITE_OTHER): Payer: BLUE CROSS/BLUE SHIELD | Admitting: Nurse Practitioner

## 2014-12-09 VITALS — BP 126/75 | HR 76 | Temp 97.3°F | Ht 67.0 in | Wt 209.0 lb

## 2014-12-09 DIAGNOSIS — K50119 Crohn's disease of large intestine with unspecified complications: Secondary | ICD-10-CM

## 2014-12-09 NOTE — Assessment & Plan Note (Addendum)
Symptoms very well controlled on current regimen of Humira 40 mg every 2 weeks and Entocort as needed. She is not currently taking the Entocort. She has increased her water intake and is working on weight loss. No concerning signs or symptoms for ongoing infection or cancer. No other red flag/warning signs or symptoms. Last blood work approximately 5 or 6 months ago. Today we will recheck CBC, CMP. I have her follow-up in 6 months for routine care.
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
## 2014-12-09 NOTE — Patient Instructions (Addendum)
1. Have your lab work drawn when you're able to. 2. Return for follow-up in 6 months. 3. Cause of yet any recurrent or worsening symptoms. 4. Keep up the excellent work with weight loss and increased water intake. 5. Return for follow-up in 6 months.

## 2014-12-10 LAB — CBC WITH DIFFERENTIAL/PLATELET
BASOS ABS: 0 10*3/uL (ref 0.0–0.1)
BASOS PCT: 0 % (ref 0–1)
EOS ABS: 0.7 10*3/uL (ref 0.0–0.7)
EOS PCT: 5 % (ref 0–5)
HEMATOCRIT: 38 % (ref 36.0–46.0)
HEMOGLOBIN: 12.5 g/dL (ref 12.0–15.0)
Lymphocytes Relative: 43 % (ref 12–46)
Lymphs Abs: 5.6 10*3/uL — ABNORMAL HIGH (ref 0.7–4.0)
MCH: 27.9 pg (ref 26.0–34.0)
MCHC: 32.9 g/dL (ref 30.0–36.0)
MCV: 84.8 fL (ref 78.0–100.0)
MPV: 9.3 fL (ref 8.6–12.4)
Monocytes Absolute: 0.8 10*3/uL (ref 0.1–1.0)
Monocytes Relative: 6 % (ref 3–12)
Neutro Abs: 6 10*3/uL (ref 1.7–7.7)
Neutrophils Relative %: 46 % (ref 43–77)
Platelets: 389 10*3/uL (ref 150–400)
RBC: 4.48 MIL/uL (ref 3.87–5.11)
RDW: 15.3 % (ref 11.5–15.5)
WBC: 13.1 10*3/uL — AB (ref 4.0–10.5)

## 2014-12-10 LAB — COMPREHENSIVE METABOLIC PANEL
ALBUMIN: 3.6 g/dL (ref 3.6–5.1)
ALT: 14 U/L (ref 6–29)
AST: 15 U/L (ref 10–30)
Alkaline Phosphatase: 67 U/L (ref 33–115)
BUN: 9 mg/dL (ref 7–25)
CALCIUM: 8.8 mg/dL (ref 8.6–10.2)
CO2: 21 mmol/L (ref 20–31)
CREATININE: 0.8 mg/dL (ref 0.50–1.10)
Chloride: 104 mmol/L (ref 98–110)
Glucose, Bld: 97 mg/dL (ref 65–99)
Potassium: 4 mmol/L (ref 3.5–5.3)
SODIUM: 136 mmol/L (ref 135–146)
TOTAL PROTEIN: 6.7 g/dL (ref 6.1–8.1)
Total Bilirubin: 0.2 mg/dL (ref 0.2–1.2)

## 2015-01-21 ENCOUNTER — Other Ambulatory Visit: Payer: Self-pay | Admitting: Family Medicine

## 2015-01-31 ENCOUNTER — Emergency Department (HOSPITAL_COMMUNITY)
Admission: EM | Admit: 2015-01-31 | Discharge: 2015-01-31 | Disposition: A | Discharge status: Home / Self Care | Payer: BLUE CROSS/BLUE SHIELD | Attending: Emergency Medicine | Admitting: Emergency Medicine

## 2015-01-31 ENCOUNTER — Encounter (HOSPITAL_COMMUNITY): Payer: Self-pay | Admitting: *Deleted

## 2015-01-31 DIAGNOSIS — Z79899 Other long term (current) drug therapy: Secondary | ICD-10-CM | POA: Diagnosis not present

## 2015-01-31 DIAGNOSIS — M1990 Unspecified osteoarthritis, unspecified site: Secondary | ICD-10-CM | POA: Insufficient documentation

## 2015-01-31 DIAGNOSIS — R197 Diarrhea, unspecified: Secondary | ICD-10-CM | POA: Diagnosis not present

## 2015-01-31 DIAGNOSIS — Z8711 Personal history of peptic ulcer disease: Secondary | ICD-10-CM | POA: Insufficient documentation

## 2015-01-31 DIAGNOSIS — J45909 Unspecified asthma, uncomplicated: Secondary | ICD-10-CM | POA: Insufficient documentation

## 2015-01-31 DIAGNOSIS — K130 Diseases of lips: Secondary | ICD-10-CM | POA: Insufficient documentation

## 2015-01-31 DIAGNOSIS — Z87828 Personal history of other (healed) physical injury and trauma: Secondary | ICD-10-CM | POA: Diagnosis not present

## 2015-01-31 DIAGNOSIS — Z862 Personal history of diseases of the blood and blood-forming organs and certain disorders involving the immune mechanism: Secondary | ICD-10-CM | POA: Diagnosis not present

## 2015-01-31 DIAGNOSIS — E876 Hypokalemia: Secondary | ICD-10-CM | POA: Diagnosis not present

## 2015-01-31 DIAGNOSIS — B349 Viral infection, unspecified: Secondary | ICD-10-CM | POA: Diagnosis not present

## 2015-01-31 DIAGNOSIS — Z87891 Personal history of nicotine dependence: Secondary | ICD-10-CM | POA: Diagnosis not present

## 2015-01-31 DIAGNOSIS — K219 Gastro-esophageal reflux disease without esophagitis: Secondary | ICD-10-CM | POA: Insufficient documentation

## 2015-01-31 DIAGNOSIS — Z8619 Personal history of other infectious and parasitic diseases: Secondary | ICD-10-CM | POA: Insufficient documentation

## 2015-01-31 DIAGNOSIS — F419 Anxiety disorder, unspecified: Secondary | ICD-10-CM | POA: Insufficient documentation

## 2015-01-31 DIAGNOSIS — R0981 Nasal congestion: Secondary | ICD-10-CM | POA: Diagnosis present

## 2015-01-31 DIAGNOSIS — Z7951 Long term (current) use of inhaled steroids: Secondary | ICD-10-CM | POA: Insufficient documentation

## 2015-01-31 DIAGNOSIS — F319 Bipolar disorder, unspecified: Secondary | ICD-10-CM | POA: Insufficient documentation

## 2015-01-31 MED ORDER — VALACYCLOVIR HCL 1 G PO TABS: 1000.0000 mg | ORAL_TABLET | Freq: Two times a day (BID) | ORAL | Status: AC
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
## 2015-01-31 NOTE — Discharge Instructions (Signed)
Make sure that you drink at least six 8 ounce glasses of water daily. Use saline nasal spray one squirt in each nostril every 2 hours while awake to help with nasal congestion. Take Imodium as directed for diarrhea. Avoid milk or foods containing milk such as cheese or ice cream while having diarrhea. Take the medication prescribed as needed for your fever blister. Follow-up with Dr. Moshe Cipro if not improved in 4 or 5 days

## 2015-01-31 NOTE — ED Notes (Signed)
Pt states she has been having diarrhea since last night and has gone around 6 times, denies n/v. Pt also has chest and nasal congestion and admits to a chills.

## 2015-01-31 NOTE — ED Notes (Signed)
MD at bedside. 

## 2015-02-19 ENCOUNTER — Other Ambulatory Visit: Payer: Self-pay | Admitting: Gastroenterology

## 2015-02-20 ENCOUNTER — Other Ambulatory Visit: Payer: Self-pay | Admitting: Family Medicine

## 2015-02-27 ENCOUNTER — Encounter (HOSPITAL_COMMUNITY): Payer: BLUE CROSS/BLUE SHIELD | Attending: Oncology

## 2015-02-27 DIAGNOSIS — D509 Iron deficiency anemia, unspecified: Secondary | ICD-10-CM | POA: Diagnosis not present

## 2015-02-27 DIAGNOSIS — D72829 Elevated white blood cell count, unspecified: Secondary | ICD-10-CM | POA: Insufficient documentation

## 2015-02-27 LAB — CBC WITH DIFFERENTIAL/PLATELET
Basophils Absolute: 0.1 10*3/uL (ref 0.0–0.1)
Basophils Relative: 1 %
EOS PCT: 5 %
Eosinophils Absolute: 0.7 10*3/uL (ref 0.0–0.7)
HEMATOCRIT: 37 % (ref 36.0–46.0)
Hemoglobin: 12.2 g/dL (ref 12.0–15.0)
LYMPHS PCT: 47 %
Lymphs Abs: 6.1 10*3/uL — ABNORMAL HIGH (ref 0.7–4.0)
MCH: 28 pg (ref 26.0–34.0)
MCHC: 33 g/dL (ref 30.0–36.0)
MCV: 84.9 fL (ref 78.0–100.0)
MONO ABS: 0.6 10*3/uL (ref 0.1–1.0)
MONOS PCT: 5 %
NEUTROS ABS: 5.7 10*3/uL (ref 1.7–7.7)
NEUTROS PCT: 43 %
Platelets: 369 10*3/uL (ref 150–400)
RBC: 4.36 MIL/uL (ref 3.87–5.11)
RDW: 15.3 % (ref 11.5–15.5)
WBC: 13.1 10*3/uL — ABNORMAL HIGH (ref 4.0–10.5)

## 2015-02-27 LAB — IRON AND TIBC
Iron: 58 ug/dL (ref 28–170)
SATURATION RATIOS: 19 % (ref 10.4–31.8)
TIBC: 304 ug/dL (ref 250–450)
UIBC: 246 ug/dL

## 2015-02-27 LAB — FERRITIN: FERRITIN: 96 ng/mL (ref 11–307)

## 2015-02-27 NOTE — Progress Notes (Signed)
[name]'s reason for visit today is for labs as scheduled per MD orders.  Venipuncture performed with a 23 gauge butterfly needle to L Antecubital.  [name] tolerated procedure well and without incident; questions were answered and patient was discharged.

## 2015-03-02 ENCOUNTER — Other Ambulatory Visit (HOSPITAL_COMMUNITY): Payer: Self-pay | Admitting: Oncology

## 2015-03-02 DIAGNOSIS — D509 Iron deficiency anemia, unspecified: Secondary | ICD-10-CM

## 2015-03-12 ENCOUNTER — Ambulatory Visit: Payer: Self-pay | Admitting: Family Medicine

## 2015-03-16 ENCOUNTER — Ambulatory Visit (HOSPITAL_COMMUNITY): Payer: Self-pay | Admitting: Psychiatry

## 2015-03-24 ENCOUNTER — Encounter (HOSPITAL_COMMUNITY): Payer: Self-pay | Admitting: Psychiatry

## 2015-03-24 ENCOUNTER — Ambulatory Visit (INDEPENDENT_AMBULATORY_CARE_PROVIDER_SITE_OTHER): Payer: PRIVATE HEALTH INSURANCE | Admitting: Psychiatry

## 2015-03-24 VITALS — BP 100/62 | HR 65 | Ht 67.0 in | Wt 208.8 lb

## 2015-03-24 DIAGNOSIS — F321 Major depressive disorder, single episode, moderate: Secondary | ICD-10-CM

## 2015-03-24 MED ORDER — TRAZODONE HCL 100 MG PO TABS: 100.0000 mg | ORAL_TABLET | Freq: Every day | ORAL | Status: DC

## 2015-03-24 MED ORDER — BUPROPION HCL ER (XL) 150 MG PO TB24: 150.0000 mg | ORAL_TABLET | Freq: Two times a day (BID) | ORAL | Status: DC

## 2015-03-24 NOTE — Progress Notes (Signed)
Patient ID: [name], female   DOB: 05-30-1970, 45 y.o.   MRN: [id number] Patient ID: [name], female   DOB: March 16, 1970, 45 y.o.   MRN: [id number] Patient ID: [name], female   DOB: [birth year], 45 y.o.   MRN: [id number] Patient ID: [name], female   DOB: 04-Sep-1970, 45 y.o.   MRN: [id number] Patient ID: [name], female   DOB: 06-23-1970, 45 y.o.   MRN: [id number] Patient ID: [name], female   DOB: June 22, 1970, 45 y.o.   MRN: [id number] Patient ID: [name], female   DOB: February 12, 1971, 45 y.o.   MRN: [id number] Patient ID: [name], female   DOB: Aug 18, 1970, 45 y.o.   MRN: [id number] Patient ID: [name], female   DOB: 10-Sep-1970, 45 y.o.   MRN: [id number]  [name] [id number] Progress Note  [name] [id number] 45 y.o.  03/24/2015 9:56 AM  Chief Complaint: "I've been doing better  History of Present Illness:  This patient is a 45 year old black female who is married and lives with her 2 children and husband in [place]. She is on disability for Crohn's disease.  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 She got severely constipated with a high fever . Once it was determined what was wrong she's had an up-and-down course with her Crohn's. Currently is under good control because she has figured out which foods make it worse. The patient got depressed after developing Crohn's and was hospitalized twice at behavioral health hospital in the past. this was complicated by the fact that she was abusing marijuana.  The patient returns after 4 months. 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 She is enjoying these very much. She would like to move forward and eventually get a 4 year degree. Her Crohn's is under good control and she denies being significantly depressed or anxious    Suicidal Ideation: No Plan Formed: No Patient has means to carry out plan: No  Homicidal  Ideation: No Plan Formed: No Patient has means to carry out plan: No  Review of Systems: Psychiatric: Agitation: No Hallucination: No Depressed Mood: No Insomnia: Yes Hypersomnia: No Altered Concentration: No Feels Worthless: No Grandiose Ideas: No Belief In Special Powers: No New/Increased Substance Abuse: No Compulsions: No  Neurologic: Headache: Yes Seizure: No Paresthesias: No  Social History: Patient lives with her husband and children.  She has one son and 2 daughter.  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  She likes reading listing music walking and fishing.  Medical history Patient has multiple medical issues.  She has asthma, Crohn's disease, acid reflux, headaches, anemia, sleep apnea and arthritis.  Her primary care physician is Dr. Moshe Cipro.  Her neurologist is Dr Elpidio Galea and her gastroenterologist is Dr. Quincy Sheehan.   Alcohol and substance use history. Patient admitted using marijuana but claims to be sober since 2012. Outpatient Encounter Prescriptions as of 03/24/2015  Medication Sig  . budesonide (ENTOCORT EC) 3 MG 24 hr capsule Take 3 capsules (9 mg total) by mouth daily.  Marland Kitchen buPROPion (WELLBUTRIN XL) 150 MG 24 hr tablet Take 1 tablet (150 mg total) by mouth 2 (two) times daily.  . cetirizine (ZYRTEC) 10 MG tablet Take 10 mg by mouth daily as needed for allergies.   . chlorpheniramine (CHLOR-TRIMETON) 4 MG tablet Take 4 mg by mouth 2 (two) times daily as needed for allergies.  . Cholecalciferol (VITAMIN D-3 PO) Take  1 tablet by mouth daily.  . Cyanocobalamin (B-12) 1000 MCG CAPS Take 1 capsule by mouth daily.    . diphenhydrAMINE (BENADRYL) 25 mg capsule Take 25 mg by mouth daily as needed. For allergies   . fluticasone (FLONASE) 50 MCG/ACT nasal spray USE TWO SPRAYS INTO EACH NOSTRIL ONCE DAILY  . HUMIRA 40 MG/0.8ML PSKT ADMINISTER 0.8 ML(40 MG) UNDER THE SKIN EVERY 14 DAYS  . hydrocortisone (ANUSOL-HC) 2.5 % rectal cream Place 1 application rectally 2 (two) times daily.  (Patient taking differently: Place 1 application rectally 2 (two) times daily as needed. )  . iron polysaccharides (NIFEREX) 150 MG capsule Take 1 capsule (150 mg total) by mouth daily.  . Liniments (SALONPAS ARTHRITIS PAIN RELIEF) PADS Apply 1 each topically every 8 (eight) hours as needed. pain  . Multiple Vitamin (MULTIVITAMIN) tablet Take 1 tablet by mouth daily.  . Omega-3 Fatty Acids (FISH OIL) 1200 MG CAPS Take 1,200 mg by mouth 2 (two) times daily.  Marland Kitchen omeprazole (PRILOSEC) 20 MG capsule TAKE ONE CAPSULE BY MOUTH TWICE A DAY BEFORE A MEAL  . potassium chloride SA (K-DUR,KLOR-CON) 20 MEQ tablet TAKE ONE TABLET TWICE DAILY  . PROAIR HFA 108 (90 BASE) MCG/ACT inhaler USE TWO PUFFS EVERY SIX HOURS AS NEEDED FOR WHEEZING  . Probiotic Product (PROBIOTIC PO) Take 1 capsule by mouth daily.  . traZODone (DESYREL) 100 MG tablet Take 1 tablet (100 mg total) by mouth at bedtime.  . [DISCONTINUED] buPROPion (WELLBUTRIN XL) 150 MG 24 hr tablet Take 1 tablet (150 mg total) by mouth 2 (two) times daily.  . [DISCONTINUED] traZODone (DESYREL) 100 MG tablet Take 1 tablet (100 mg total) by mouth at bedtime.   No facility-administered encounter medications on file as of 03/24/2015.    Past Psychiatric History/Hospitalization(s): Patient has multiple psychiatric hospitalizations in the past.  Her last psychiatric hospitalization in 2005 but she also admitted hospitalization in 2008 which we do not have details.  She has history of taking overdose on her medication .  She has history of depression and she was also diagnosed with bipolar disorder.  In the past she has taken Seroquel, Risperdal, Celexa, Lexapro, and Xanax.  She has seen psychologist in the past.  Anxiety: No Bipolar Disorder: Yes Depression: Yes Mania: Yes Psychosis: No Schizophrenia: No Personality Disorder: No Hospitalization for psychiatric illness: Yes History of Electroconvulsive Shock Therapy: No Prior Suicide Attempts: No  Physical  Exam: Constitutional:  BP 100/62 mmHg  Pulse 65  Ht 5' 7"  (1.702 m)  Wt 208 lb 12.8 oz (94.711 kg)  BMI 32.69 kg/m2  SpO2 95%  General Appearance: alert, oriented, no acute distress and well nourished  Musculoskeletal: Strength & Muscle Tone: within normal limits Gait & Station: normal Patient leans: N/A  Psychiatric: Speech (describe rate, volume, coherence, spontaneity, and abnormalities if any):  clear and coherent normal tone and volume.    Thought Process (describe rate, content, abstract reasoning, and computation):logical and goal-directed.    Associations: Coherent and Intact  Thoughts: normal  Mental Status: Orientation: oriented to person, place and time/date Mood & Affect: Mood is good and affect is bright Attention Span & Concentration:  fair   Medical Decision Making (Choose Three): Established Problem, Stable/Improving (1), Review of Psycho-Social Stressors (1), Review or order clinical lab tests (1), Review and summation of old records (2), Review of Last Therapy Session (1) and Review of Medication Regimen & Side Effects (2)  Assessment: Axis I: Maj. depressive disorder, recurrent  Axis II: deferred  Axis III:  Patient Active Problem List   Diagnosis Date Noted  . Annual physical exam 10/28/2014  . Fever blister 02/21/2014  . Iron deficiency anemia 08/28/2013  . Apnea, sleep 06/21/2012  . Obesity (BMI 30.0-34.9) 04/04/2012  . Bipolar disorder ([place]) 04/03/2012  . Hx of nicotine dependence 11/14/2011  . High risk for colon cancer 03/15/2011  . Leukocytosis 10/06/2010  . ECHOCARDIOGRAM, ABNORMAL 01/11/2008  . HYPERGLYCEMIA 02/13/2007  . Crohn's colitis ([place]) 05/15/2006  . ANEMIA, B12 DEFICIENCY 12/29/2005  . ANXIETY 12/29/2005  . Depression, controlled 12/29/2005  . ALLERGIC RHINITIS 12/29/2005  . ASTHMA 12/29/2005  . GERD 12/29/2005  . PEPTIC ULCER DISEASE 12/29/2005    Axis IV: mild to moderate  Axis V:  55-60   Plan:. Patient will  continue Wellbutrin XL 150 mg twice a day for depression andtrazodone to 100 mg daily at bedtime to help with sleep.  I recommend to call us back if she is any question of  worsening of the symptom.  Otherwise I will see her in 4 months Time spent 15 minutes.  More than 50% of the time spent and psychoeducation, counseling and coordination of care.    Levonne Spiller, MD 03/24/2015

## 2015-04-09 ENCOUNTER — Encounter: Payer: Self-pay | Admitting: Nurse Practitioner

## 2015-04-10 ENCOUNTER — Other Ambulatory Visit: Payer: Self-pay | Admitting: Gastroenterology

## 2015-04-14 ENCOUNTER — Encounter: Payer: Self-pay | Admitting: Family Medicine

## 2015-04-14 ENCOUNTER — Other Ambulatory Visit: Payer: Self-pay

## 2015-04-14 MED ORDER — ACYCLOVIR 400 MG PO TABS: 400.0000 mg | ORAL_TABLET | Freq: Three times a day (TID) | ORAL | Status: DC

## 2015-04-15 ENCOUNTER — Telehealth: Payer: Self-pay | Admitting: Internal Medicine

## 2015-04-15 ENCOUNTER — Encounter: Payer: Self-pay | Admitting: Nurse Practitioner

## 2015-04-15 NOTE — Telephone Encounter (Signed)
Pt called asking for JL. I told her that JL was in a meeting and I could transfer her to VM.

## 2015-04-15 NOTE — Telephone Encounter (Signed)
Pt left a detailed voicemail- she said she didn't want to take humira anymore. She said she has gained a lot of weight because of it, her face is broke out and she is having a lot of fever blisters which she feels is coming from the [place]. She said she doesn't have time for an office visit because she is in school everyday, she just wants a new medication to help her. She will not be available until tomorrow after 1:30pm to speak with her.

## 2015-04-16 NOTE — Telephone Encounter (Signed)
Please tell the patient that it's a high risk situation to just stop Humira and send something else in "over the phone" given the amount of time she's been on Humira; also, while she's doing well now, she had complicated disease years back and changing medications has the risk of flare of her disease. She would need to be seen and I would actually prefer her to see the MD (Dr. Gala Romney) for changing of Crohn's medications after that much time with historically that significant of disease. I understand her concern with time constraints, but our primary interest is in her health and safety. I'm sure there's something we could arrange at a mutually available time.

## 2015-04-20 NOTE — Telephone Encounter (Signed)
Late entry- message was sent to the pt via mychart.

## 2015-05-02 ENCOUNTER — Encounter: Payer: Self-pay | Admitting: Nurse Practitioner

## 2015-05-04 ENCOUNTER — Encounter: Payer: Self-pay | Admitting: Family Medicine

## 2015-05-04 ENCOUNTER — Other Ambulatory Visit: Payer: Self-pay | Admitting: Gastroenterology

## 2015-05-04 ENCOUNTER — Ambulatory Visit (INDEPENDENT_AMBULATORY_CARE_PROVIDER_SITE_OTHER): Payer: BLUE CROSS/BLUE SHIELD | Admitting: Family Medicine

## 2015-05-04 VITALS — BP 130/72 | HR 64 | Resp 18 | Ht 67.0 in | Wt 202.0 lb

## 2015-05-04 DIAGNOSIS — Z114 Encounter for screening for human immunodeficiency virus [HIV]: Secondary | ICD-10-CM | POA: Diagnosis not present

## 2015-05-04 DIAGNOSIS — J4520 Mild intermittent asthma, uncomplicated: Secondary | ICD-10-CM

## 2015-05-04 DIAGNOSIS — E559 Vitamin D deficiency, unspecified: Secondary | ICD-10-CM

## 2015-05-04 DIAGNOSIS — J3089 Other allergic rhinitis: Secondary | ICD-10-CM

## 2015-05-04 DIAGNOSIS — F32A Depression, unspecified: Secondary | ICD-10-CM

## 2015-05-04 DIAGNOSIS — K50119 Crohn's disease of large intestine with unspecified complications: Secondary | ICD-10-CM

## 2015-05-04 DIAGNOSIS — R1084 Generalized abdominal pain: Secondary | ICD-10-CM

## 2015-05-04 DIAGNOSIS — R7309 Other abnormal glucose: Secondary | ICD-10-CM

## 2015-05-04 DIAGNOSIS — F329 Major depressive disorder, single episode, unspecified: Secondary | ICD-10-CM

## 2015-05-04 DIAGNOSIS — D509 Iron deficiency anemia, unspecified: Secondary | ICD-10-CM | POA: Diagnosis not present

## 2015-05-04 DIAGNOSIS — R109 Unspecified abdominal pain: Secondary | ICD-10-CM | POA: Insufficient documentation

## 2015-05-04 DIAGNOSIS — E669 Obesity, unspecified: Secondary | ICD-10-CM

## 2015-05-04 NOTE — Assessment & Plan Note (Signed)
Unable to afford medication x 2 months, c/o chronic abdominal pain and painful bowel movements, sooner f/u with GI needed to address the issue,appt made for this week and pt is aware

## 2015-05-04 NOTE — Patient Instructions (Addendum)
Annual wellness first week Sept call if you need me sooner  An appointment with your GI Provider has been scheduled for this week, get info at checkout.  Will hold on tN assistance request for pharma consult until we hear back from you after GI visit, if needed  Fasting lipid, cmp , HIV and TSH and Vit D August 23 or after  Please work on good  health habits so that your health will improve. 1. Commitment to daily physical activity for 30 to 60  minutes, if you are able to do this.  2. Commitment to wise food choices. Aim for half of your  food intake to be vegetable and fruit, one quarter starchy foods, and one quarter protein. Try to eat on a regular schedule  3 meals per day, snacking between meals should be limited to vegetables or fruits or small portions of nuts. 64 ounces of water per day is generally recommended, unless you have specific health conditions, like heart failure or kidney failure where you will need to limit fluid intake.  3. Commitment to sufficient and a  good quality of physical and mental rest daily, generally between 6 to 8 hours per day.  WITH PERSISTANCE AND PERSEVERANCE, THE IMPOSSIBLE , BECOMES THE NORM! Thanks for choosing [facility], we consider it a privelige to serve you.

## 2015-05-04 NOTE — Assessment & Plan Note (Signed)
Controlled, no change in medication  

## 2015-05-04 NOTE — Assessment & Plan Note (Signed)
Increased anxiety , tearful as she is having GI symptoms and is unable to afford her medication Not suicidal or homicidal, being followed by psych

## 2015-05-04 NOTE — Progress Notes (Signed)
   Subjective:    Patient ID: [name], female    DOB: [birth year], 45 y.o.   MRN: [id number]  HPI   [name]     MRN: [id number]      DOB: 12-21-1970   HPI [name] is here for follow up and re-evaluation of chronic medical conditions, most specific c/o epigastric and lower abdominal pain in past 2 months since unable to afford her medication for colitis. Refuses to provided financial info for help with medication, needs GI involvement to help to get situation sorted out. C/o painful BM even though she is not constipated.,  Preventive health is updated, specifically  Cancer screening and Immunization.   Questions or concerns regarding consultations or procedures which the PT has had in the interim are  addressed. The PT denies any adverse reactions to current medications since the last visit.    ROS Denies recent fever or chills. Denies sinus pressure, nasal congestion, ear pain or sore throat. Denies chest congestion, productive cough or wheezing. Denies chest pains, palpitations and leg swelling  Denies dysuria, frequency, hesitancy or incontinence. Denies joint pain, swelling and limitation in mobility. Denies headaches, seizures, numbness, or tingling. nia. Denies skin break down or rash.   PE  BP 130/72 mmHg  Pulse 64  Resp 18  Ht 5' 7"  (1.702 m)  Wt 202 lb (91.627 kg)  BMI 31.63 kg/m2  SpO2 97%  Patient alert and oriented and in no cardiopulmonary distress.  HEENT: No facial asymmetry, EOMI,   oropharynx pink and moist.  Neck supple no JVD, no mass.  Chest: Clear to auscultation bilaterally.  CVS: S1, S2 no murmurs, no S3.Regular rate.  ABD: Soft non tender. To palpation  Ext: No edema  MS: Adequate ROM spine, shoulders, hips and knees.  Skin: Intact, no ulcerations or rash noted. s  Psych: Good eye contact, normal affect. Memory intact  Anxious not  depressed appearing.  CNS: CN 2-12 intact, power,  normal throughout.no focal deficits  noted.   Assessment & Plan   Crohn's colitis Unable to afford medication x 2 months, c/o chronic abdominal pain and painful bowel movements, sooner f/u with GI needed to address the issue,appt made for this week and pt is aware  Depression, controlled Increased anxiety , tearful as she is having GI symptoms and is unable to afford her medication Not suicidal or homicidal, being followed by psych  Allergic rhinitis Controlled, no change in medication   Asthma Controlled, no change in medication       Review of Systems     Objective:   Physical Exam        Assessment & Plan:

## 2015-05-08 ENCOUNTER — Encounter: Payer: Self-pay | Admitting: Gastroenterology

## 2015-05-08 ENCOUNTER — Ambulatory Visit (INDEPENDENT_AMBULATORY_CARE_PROVIDER_SITE_OTHER): Payer: BLUE CROSS/BLUE SHIELD | Admitting: Gastroenterology

## 2015-05-08 VITALS — BP 115/78 | HR 85 | Temp 98.0°F | Ht 67.0 in | Wt 200.8 lb

## 2015-05-08 DIAGNOSIS — K50019 Crohn's disease of small intestine with unspecified complications: Secondary | ICD-10-CM | POA: Diagnosis not present

## 2015-05-08 MED ORDER — BUDESONIDE 3 MG PO CPEP: 9.0000 mg | ORAL_CAPSULE | Freq: Every day | ORAL | Status: DC

## 2015-05-08 MED ORDER — ADALIMUMAB 40 MG/0.8ML ~~LOC~~ PSKT
40.0000 mg | PREFILLED_SYRINGE | SUBCUTANEOUS | Status: DC
Start: 2015-05-08 — End: 2015-11-30

## 2015-05-08 NOTE — Progress Notes (Signed)
Primary Care Physician: [name], MD  Primary Gastroenterologist:  Garfield Cornea, MD   Chief Complaint  Patient presents with  . Crohn's Disease    HPI: [name] is a 45 y.o. female here for follow-up of ileocolonic Crohn's disease. She was last seen October 2016 and doing well at that time. Last colonoscopy January 2013, status post right hemicolectomy, cecum absent, normal appearing residual colon, relatively inactive disease at the anastomosis endoscopically.  Patient states that she been doing extremely well until she was unable to obtain her Humira. Her last dose was back in December. She went to obtain her Humira at [facility] and was told that her co-pay was $1500. She states she could not afford it. She's never had that problem before. She's been on Humira for many years. $1500 copay. Has been out humira for two months. Has the Humira rebate card given from here, plans on trying [facility] to have it [facility].    Off Humira, she started having postprandial abdominal burning type pain, also has burning with bowel movements. Bowel movements up to 6-7 times daily. Stools are loose. No blood in the stool or melena. A lot of urgency after meals. No nausea or vomiting. Appetite okay.   Has not been on entocort in a long time contrary to the current medication list.   Current Outpatient Prescriptions  Medication Sig Dispense Refill  . budesonide (ENTOCORT EC) 3 MG 24 hr capsule Take 3 capsules (9 mg total) by mouth daily. 90 capsule 1  . buPROPion (WELLBUTRIN XL) 150 MG 24 hr tablet Take 1 tablet (150 mg total) by mouth 2 (two) times daily. 60 tablet 3  . cetirizine (ZYRTEC) 10 MG tablet Take 10 mg by mouth daily as needed for allergies.     . chlorpheniramine (CHLOR-TRIMETON) 4 MG tablet Take 4 mg by mouth 2 (two) times daily as needed for allergies.    . Cholecalciferol (VITAMIN D-3 PO) Take 1 tablet by mouth daily.    . Cyanocobalamin (B-12)  1000 MCG CAPS Take 1 capsule by mouth daily.      . diphenhydrAMINE (BENADRYL) 25 mg capsule Take 25 mg by mouth daily as needed. For allergies     . fluticasone (FLONASE) 50 MCG/ACT nasal spray USE TWO SPRAYS INTO EACH NOSTRIL ONCE DAILY 16 g 6  . iron polysaccharides (NIFEREX) 150 MG capsule Take 1 capsule (150 mg total) by mouth daily. 30 capsule 11  . Liniments (SALONPAS ARTHRITIS PAIN RELIEF) PADS Apply 1 each topically every 8 (eight) hours as needed. pain    . Multiple Vitamin (MULTIVITAMIN) tablet Take 1 tablet by mouth daily.    . Omega-3 Fatty Acids (FISH OIL) 1200 MG CAPS Take 1,200 mg by mouth 2 (two) times daily.    Marland Kitchen omeprazole (PRILOSEC) 20 MG capsule TAKE ONE (1) CAPSULE BY MOUTH TWICE A DAY BEFORE A MEAL 60 capsule 5  . potassium chloride SA (K-DUR,KLOR-CON) 20 MEQ tablet TAKE ONE TABLET TWICE DAILY 60 tablet 1  . PROAIR HFA 108 (90 BASE) MCG/ACT inhaler USE TWO PUFFS EVERY SIX HOURS AS NEEDED FOR WHEEZING 8.5 g 3  . Probiotic Product (PROBIOTIC PO) Take 1 capsule by mouth daily.    Marland Kitchen PROCTOZONE-HC 2.5 % rectal cream APPLY RECTALLY TWICE DAILY 30 g 0  . traZODone (DESYREL) 100 MG tablet Take 1 tablet (100 mg total) by mouth at bedtime. 30 tablet 3  . HUMIRA 40 MG/0.8ML PSKT ADMINISTER 0.8 ML(40  MG) UNDER THE SKIN EVERY 14 DAYS (Patient not taking: Reported on 05/04/2015) 2 each 3   No current facility-administered medications for this visit.    Allergies as of 05/08/2015 - Review Complete 05/08/2015  Allergen Reaction Noted  . Oxycodone-acetaminophen Itching 12/29/2005  . Oxycodone-acetaminophen Itching 10/17/2010  . Peanut-containing drug products  03/22/2011  . Shellfish allergy  03/22/2011  . Tramadol hcl Itching     ROS:  General: Negative for anorexia, weight loss, fever, chills, fatigue, weakness. ENT: Negative for hoarseness, difficulty swallowing , nasal congestion. CV: Negative for chest pain, angina, palpitations, dyspnea on exertion, peripheral edema.    Respiratory: Negative for dyspnea at rest, dyspnea on exertion, cough, sputum, wheezing.  GI: See history of present illness. GU:  Negative for dysuria, hematuria, urinary incontinence, urinary frequency, nocturnal urination.  Endo: Negative for unusual weight change.    Physical Examination:   BP 115/78 mmHg  Pulse 85  Temp(Src) 98 F (36.7 C)  Ht 5' 7"  (1.702 m)  Wt 200 lb 12.8 oz (91.082 kg)  BMI 31.44 kg/m2  LMP 04/27/2015  General: Well-nourished, well-developed in no acute distress.  Eyes: No icterus. Mouth: Oropharyngeal mucosa moist and pink , no lesions erythema or exudate. Lungs: Clear to auscultation bilaterally.  Heart: Regular rate and rhythm, no murmurs rubs or gallops.  Abdomen: Bowel sounds are normal, mild lower abdominal tenderness to deep palpation, nondistended, no hepatosplenomegaly or masses, no abdominal bruits or hernia , no rebound or guarding.   Extremities: No lower extremity edema. No clubbing or deformities. Neuro: Alert and oriented x 4   Skin: Warm and dry, no jaundice.   Psych: Alert and cooperative, normal mood and affect.  Labs:  Lab Results  Component Value Date   ALT 14 12/09/2014   AST 15 12/09/2014   ALKPHOS 67 12/09/2014   BILITOT 0.2 12/09/2014   Lab Results  Component Value Date   CREATININE 0.80 12/09/2014   BUN 9 12/09/2014   NA 136 12/09/2014   K 4.0 12/09/2014   CL 104 12/09/2014   CO2 21 12/09/2014   Lab Results  Component Value Date   WBC 13.1* 02/27/2015   HGB 12.2 02/27/2015   HCT 37.0 02/27/2015   MCV 84.9 02/27/2015   PLT 369 02/27/2015   Lab Results  Component Value Date   IRON 58 02/27/2015   TIBC 304 02/27/2015   FERRITIN 96 02/27/2015    Imaging Studies: No results found.

## 2015-05-08 NOTE — Assessment & Plan Note (Signed)
45 year old female with history of long-standing ileal Crohn's disease status post right hemicolectomy years ago, on Humira for nearly a decade who presents with flare of symptoms after being unable to obtain her Humira for the past 2 months. She switched pharmacies, come first of the year she was told it will cost her $1500 co-pay for her Humira. We have been trying to work this issue out for her. Patient plans to submit prescription under her husband's insurance as she has dual coverage to see if that would be more cost effective. She also has a rebate card. In the interim for her flare, we began Entocort 9 mg daily. We will taper once she is back on her Humira.  She is to let me know immediately if she has any problems obtaining her medication. Later this year, consider colonoscopy for high-risk surveillance. Return office visit with Dr. Gala Romney in 3 months.

## 2015-05-08 NOTE — Progress Notes (Signed)
Please let patient know that I want her to come back to see Dr. Gala Romney in three months. Consider colonoscopy after that visit.

## 2015-05-08 NOTE — Patient Instructions (Signed)
1. Please start Entocort 37m daily. RX sent to pharmacy. 2. New RX for Humira sent to pharmacy. Call and let [place] when you get your medication so that we can give further instructions regarding tapering off of Entocort. 3. I will let you know when you're due for your next colonoscopy.

## 2015-05-11 ENCOUNTER — Other Ambulatory Visit: Payer: Self-pay

## 2015-05-11 NOTE — Progress Notes (Signed)
cc'ed to pcp °

## 2015-05-12 ENCOUNTER — Other Ambulatory Visit: Payer: Self-pay | Admitting: Family Medicine

## 2015-05-15 NOTE — Progress Notes (Signed)
Please schedule ov with RMR in 3 months.

## 2015-05-18 ENCOUNTER — Telehealth: Payer: Self-pay

## 2015-05-18 NOTE — Telephone Encounter (Signed)
Pt called, she is still unable to get her humira. I called the pharmacy at [facility] twice today, finally got someone on the phone. They said it was denied because the pt has to get it from her specialty pharmacy, she gave me the insurance phone number. [facility]. They said she had to get it thru [place]. Phone number- [phone], fax number [phone]. I tried to call the pharmacy and was put on hold. I called the pt and explained this to her. Asked her to call them to set up an account for delivery, etc and I have faxed the rx to the specialty pharmacy. The PA for this was approved until 04/06/2018.

## 2015-05-18 NOTE — Progress Notes (Signed)
ON RECALL  °

## 2015-05-19 NOTE — Telephone Encounter (Signed)
Routing to LSL for [employer]

## 2015-05-19 NOTE — Telephone Encounter (Signed)
Thank you for your efforts! She needs to start Entocort taper once Humira is started. Would drop to 79m daily after first dose of Humira. Then taper by 385mevery two weeks until off.

## 2015-05-19 NOTE — Telephone Encounter (Signed)
Message sent to the pt via mychart

## 2015-06-03 NOTE — Telephone Encounter (Signed)
Noted. Thanks.

## 2015-06-03 NOTE — Telephone Encounter (Signed)
Tried to call pt- NA- left very detailed message about humira samples and the [employer]. Asked her to call back if she has any questions. I will call her when the samples arrive and will have the paperwork for her to sign when she picks up the samples of humira.

## 2015-06-03 NOTE — Telephone Encounter (Addendum)
Pt called- she still hasnt received her humira from [facility]. Drug rep in the office today, asked her what we can do since pt has been out of humira for almost 3 months. She is going to get her an emergency supply of humira samples and have it shipped to our office. We hope to have it by Tuesday.pt also needs to sign up for ambassador program so they can help her with [facility] and getting her humira shipped to her. Pt is only available in the afternoons to talk on the phone.

## 2015-06-09 ENCOUNTER — Ambulatory Visit: Payer: Self-pay | Admitting: Nurse Practitioner

## 2015-06-10 ENCOUNTER — Other Ambulatory Visit: Payer: Self-pay

## 2015-06-10 ENCOUNTER — Encounter: Payer: Self-pay | Admitting: Gastroenterology

## 2015-06-10 ENCOUNTER — Encounter: Payer: Self-pay | Admitting: Family Medicine

## 2015-06-10 ENCOUNTER — Other Ambulatory Visit: Payer: Self-pay | Admitting: Gastroenterology

## 2015-06-10 ENCOUNTER — Other Ambulatory Visit (HOSPITAL_COMMUNITY): Payer: Self-pay | Admitting: Emergency Medicine

## 2015-06-10 ENCOUNTER — Encounter (HOSPITAL_COMMUNITY): Payer: Self-pay | Admitting: Oncology

## 2015-06-10 DIAGNOSIS — D509 Iron deficiency anemia, unspecified: Secondary | ICD-10-CM

## 2015-06-10 MED ORDER — OMEPRAZOLE 20 MG PO CPDR: DELAYED_RELEASE_CAPSULE | ORAL | Status: DC

## 2015-06-10 MED ORDER — BUDESONIDE 3 MG PO CPEP: 9.0000 mg | ORAL_CAPSULE | Freq: Every day | ORAL | Status: DC

## 2015-06-10 MED ORDER — ALBUTEROL SULFATE HFA 108 (90 BASE) MCG/ACT IN AERS: INHALATION_SPRAY | RESPIRATORY_TRACT | Status: DC

## 2015-06-10 MED ORDER — POLYSACCHARIDE IRON COMPLEX 150 MG PO CAPS: 150.0000 mg | ORAL_CAPSULE | Freq: Every day | ORAL | Status: DC

## 2015-06-10 MED ORDER — POTASSIUM CHLORIDE CRYS ER 20 MEQ PO TBCR: 20.0000 meq | EXTENDED_RELEASE_TABLET | Freq: Two times a day (BID) | ORAL | Status: DC

## 2015-06-10 MED ORDER — FLUTICASONE PROPIONATE 50 MCG/ACT NA SUSP: NASAL | Status: DC

## 2015-07-08 ENCOUNTER — Other Ambulatory Visit: Payer: Self-pay | Admitting: Gastroenterology

## 2015-07-08 MED ORDER — HYDROCORTISONE 2.5 % RE CREA: TOPICAL_CREAM | Freq: Two times a day (BID) | RECTAL | Status: DC

## 2015-07-20 ENCOUNTER — Encounter: Payer: Self-pay | Admitting: Internal Medicine

## 2015-07-22 ENCOUNTER — Ambulatory Visit (INDEPENDENT_AMBULATORY_CARE_PROVIDER_SITE_OTHER): Payer: BLUE CROSS/BLUE SHIELD | Admitting: Psychiatry

## 2015-07-22 ENCOUNTER — Encounter (HOSPITAL_COMMUNITY): Payer: Self-pay | Admitting: Psychiatry

## 2015-07-22 VITALS — BP 109/72 | HR 75 | Ht 67.0 in | Wt 205.8 lb

## 2015-07-22 DIAGNOSIS — F332 Major depressive disorder, recurrent severe without psychotic features: Secondary | ICD-10-CM | POA: Diagnosis not present

## 2015-07-22 DIAGNOSIS — F321 Major depressive disorder, single episode, moderate: Secondary | ICD-10-CM

## 2015-07-22 MED ORDER — BUPROPION HCL ER (XL) 150 MG PO TB24: 150.0000 mg | ORAL_TABLET | Freq: Two times a day (BID) | ORAL | Status: DC

## 2015-07-22 MED ORDER — TRAZODONE HCL 100 MG PO TABS: 100.0000 mg | ORAL_TABLET | Freq: Every day | ORAL | Status: DC

## 2015-07-22 NOTE — Progress Notes (Signed)
Patient ID: [name], female   DOB: 11/30/1970, 45 y.o.   MRN: [id number] Patient ID: [name], female   DOB: 12/14/1970, 45 y.o.   MRN: [id number] Patient ID: [name], female   DOB: Jul 11, 1970, 45 y.o.   MRN: [id number] Patient ID: [name], female   DOB: January 06, 1971, 45 y.o.   MRN: [id number] Patient ID: [name], female   DOB: 1970-06-09, 45 y.o.   MRN: [id number] Patient ID: [name], female   DOB: 1970-11-26, 45 y.o.   MRN: [id number] Patient ID: [name], female   DOB: 04-09-1970, 45 y.o.   MRN: [id number] Patient ID: [name], female   DOB: 1971/01/25, 45 y.o.   MRN: [id number] Patient ID: [name], female   DOB: 10-29-1970, 45 y.o.   MRN: [id number] Patient ID: [name], female   DOB: 09/27/1970, 45 y.o.   MRN: [id number]  [facility] [address] Progress Note  [name] [id number] 45 y.o.  07/22/2015 10:51 AM  Chief Complaint: "I've been doing better  History of Present Illness:  This patient is a 45 year old black female who is married and lives with her 2 children and husband in [place]. She is on disability for Crohn's disease.  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 She got severely constipated with a high fever . Once it was determined what was wrong she's had an up-and-down course with her Crohn's. Currently is under good control because she has figured out which foods make it worse. The patient got depressed after developing Crohn's and was hospitalized twice at behavioral health hospital in the past. this was complicated by the fact that she was abusing marijuana.  The patient returns after 4 months. 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 She is enjoying these very much. She has done well academically. Her Crohn's disease is under because she recently went back on Humira. She had to stop it a while because of cost. Her mood has been good but that  brand of Wellbutrin that she is getting is making her drowsy so I suggested she take it all at bedtime. She is generally sleeping well and doesn't always use the trazodone.    Suicidal Ideation: No Plan Formed: No Patient has means to carry out plan: No  Homicidal Ideation: No Plan Formed: No Patient has means to carry out plan: No  Review of Systems: Psychiatric: Agitation: No Hallucination: No Depressed Mood: No Insomnia: Yes Hypersomnia: No Altered Concentration: No Feels Worthless: No Grandiose Ideas: No Belief In Special Powers: No New/Increased Substance Abuse: No Compulsions: No  Neurologic: Headache: Yes Seizure: No Paresthesias: No  Social History: Patient lives with her husband and children.  She has one son and 2 daughter.  She has a high school education.  She likes reading listing music walking and fishing.  Medical history Patient has multiple medical issues.  She has asthma, Crohn's disease, acid reflux, headaches, anemia, sleep apnea and arthritis.  Her primary care physician is Dr. Moshe Cipro.  Her neurologist is Dr Wylie Hail and her gastroenterologist is Dr. Quincy Sheehan.   Alcohol and substance use history. Patient admitted using marijuana but claims to be sober since 2012. Outpatient Encounter Prescriptions as of 07/22/2015  Medication Sig  . Adalimumab (HUMIRA) 40 MG/0.8ML PSKT Inject 0.8 mLs (40 mg total) into the skin every 14 (fourteen) days.  Marland Kitchen albuterol (PROAIR HFA) 108 (90 Base) MCG/ACT inhaler USE  TWO PUFFS EVERY SIX HOURS AS NEEDED FOR WHEEZING  . buPROPion (WELLBUTRIN XL) 150 MG 24 hr tablet Take 1 tablet (150 mg total) by mouth 2 (two) times daily.  . cetirizine (ZYRTEC) 10 MG tablet Take 10 mg by mouth daily as needed for allergies.   . chlorpheniramine (CHLOR-TRIMETON) 4 MG tablet Take 4 mg by mouth 2 (two) times daily as needed for allergies.  . Cholecalciferol (VITAMIN D-3 PO) Take 1 tablet by mouth daily.  . Cyanocobalamin (B-12) 1000 MCG CAPS Take 1  capsule by mouth daily.    . diphenhydrAMINE (BENADRYL) 25 mg capsule Take 25 mg by mouth daily as needed. For allergies   . fluticasone (FLONASE) 50 MCG/ACT nasal spray USE TWO SPRAYS INTO EACH NOSTRIL ONCE DAILY  . hydrocortisone (PROCTOZONE-HC) 2.5 % rectal cream Place rectally 2 (two) times daily. For 7 days  . iron polysaccharides (NIFEREX) 150 MG capsule Take 1 capsule (150 mg total) by mouth daily.  . Liniments (SALONPAS ARTHRITIS PAIN RELIEF) PADS Apply 1 each topically every 8 (eight) hours as needed. pain  . Multiple Vitamin (MULTIVITAMIN) tablet Take 1 tablet by mouth daily.  . Omega-3 Fatty Acids (FISH OIL) 1200 MG CAPS Take 1,200 mg by mouth 2 (two) times daily.  Marland Kitchen omeprazole (PRILOSEC) 20 MG capsule TAKE ONE (1) CAPSULE BY MOUTH TWICE A DAY BEFORE A MEAL  . potassium chloride SA (K-DUR,KLOR-CON) 20 MEQ tablet Take 1 tablet (20 mEq total) by mouth 2 (two) times daily.  . traZODone (DESYREL) 100 MG tablet Take 1 tablet (100 mg total) by mouth at bedtime.  . [DISCONTINUED] buPROPion (WELLBUTRIN XL) 150 MG 24 hr tablet Take 1 tablet (150 mg total) by mouth 2 (two) times daily.  . [DISCONTINUED] traZODone (DESYREL) 100 MG tablet Take 1 tablet (100 mg total) by mouth at bedtime.  . [DISCONTINUED] budesonide (ENTOCORT EC) 3 MG 24 hr capsule Take 3 capsules (9 mg total) by mouth daily. (Patient not taking: Reported on 07/22/2015)  . [DISCONTINUED] Probiotic Product (PROBIOTIC PO) Take 1 capsule by mouth daily. Reported on 07/22/2015   No facility-administered encounter medications on file as of 07/22/2015.    Past Psychiatric History/Hospitalization(s): Patient has multiple psychiatric hospitalizations in the past.  Her last psychiatric hospitalization in 2005 but she also admitted hospitalization in 2008 which we do not have details.  She has history of taking overdose on her medication .  She has history of depression and she was also diagnosed with bipolar disorder.  In the past she has  taken Seroquel, Risperdal, Celexa, Lexapro, and Xanax.  She has seen psychologist in the past.  Anxiety: No Bipolar Disorder: Yes Depression: Yes Mania: Yes Psychosis: No Schizophrenia: No Personality Disorder: No Hospitalization for psychiatric illness: Yes History of Electroconvulsive Shock Therapy: No Prior Suicide Attempts: No  Physical Exam: Constitutional:  BP 109/72 mmHg  Pulse 75  Ht 5' 7"  (1.702 m)  Wt 205 lb 12.8 oz (93.35 kg)  BMI 32.23 kg/m2  General Appearance: alert, oriented, no acute distress and well nourished  Musculoskeletal: Strength & Muscle Tone: within normal limits Gait & Station: normal Patient leans: N/A  Psychiatric: Speech (describe rate, volume, coherence, spontaneity, and abnormalities if any):  clear and coherent normal tone and volume.    Thought Process (describe rate, content, abstract reasoning, and computation):logical and goal-directed.    Associations: Coherent and Intact  Thoughts: normal  Mental Status: Orientation: oriented to person, place and time/date Mood & Affect: Mood is good and affect is bright Attention  Span & Concentration:  fair   Medical Decision Making (Choose Three): Established Problem, Stable/Improving (1), Review of Psycho-Social Stressors (1), Review or order clinical lab tests (1), Review and summation of old records (2), Review of Last Therapy Session (1) and Review of Medication Regimen & Side Effects (2)  Assessment: Axis I: Maj. depressive disorder, recurrent  Axis II: deferred  Axis III:  Patient Active Problem List   Diagnosis Date Noted  . Crohn's disease of ileum with complication ([place]) 12/82/0813  . Abdominal pain 05/04/2015  . Annual physical exam 10/28/2014  . Fever blister 02/21/2014  . Iron deficiency anemia 08/28/2013  . Apnea, sleep 06/21/2012  . Obesity (BMI 30.0-34.9) 04/04/2012  . Bipolar disorder ([place]) 04/03/2012  . Hx of nicotine dependence 11/14/2011  . High risk for colon  cancer 03/15/2011  . Leukocytosis 10/06/2010  . ECHOCARDIOGRAM, ABNORMAL 01/11/2008  . HYPERGLYCEMIA 02/13/2007  . Crohn's colitis ([place]) 05/15/2006  . ANEMIA, B12 DEFICIENCY 12/29/2005  . ANXIETY 12/29/2005  . Depression, controlled 12/29/2005  . Allergic rhinitis 12/29/2005  . Asthma 12/29/2005  . GERD 12/29/2005  . PEPTIC ULCER DISEASE 12/29/2005    Axis IV: mild to moderate  Axis V:  55-60   Plan:. Patient will continue Wellbutrin XL 150 mg twice a day for depression andtrazodone to 100 mg daily at bedtime to help with sleep.  I recommend to call us back if she is any question of  worsening of the symptom.  Otherwise I will see her in 4 months Time spent 15 minutes.  More than 50% of the time spent and psychoeducation, counseling and coordination of care.    Levonne Spiller, MD 07/22/2015

## 2015-08-21 ENCOUNTER — Other Ambulatory Visit: Payer: Self-pay

## 2015-08-21 ENCOUNTER — Encounter: Payer: Self-pay | Admitting: Internal Medicine

## 2015-08-21 ENCOUNTER — Ambulatory Visit (INDEPENDENT_AMBULATORY_CARE_PROVIDER_SITE_OTHER): Payer: BLUE CROSS/BLUE SHIELD | Admitting: Internal Medicine

## 2015-08-21 VITALS — BP 118/78 | HR 100 | Temp 97.9°F | Ht 68.0 in | Wt 202.8 lb

## 2015-08-21 DIAGNOSIS — K5000 Crohn's disease of small intestine without complications: Secondary | ICD-10-CM
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
## 2015-08-21 NOTE — Patient Instructions (Addendum)
Continue humira  Vitamin B12 level to be checked next week  Lets schedule a bone density study now  Office visit in 6 months - will set up a colonoscopy at that time

## 2015-08-24 ENCOUNTER — Other Ambulatory Visit: Payer: Self-pay

## 2015-08-24 DIAGNOSIS — M810 Age-related osteoporosis without current pathological fracture: Secondary | ICD-10-CM

## 2015-08-24 DIAGNOSIS — Z13820 Encounter for screening for osteoporosis: Secondary | ICD-10-CM

## 2015-08-28 ENCOUNTER — Ambulatory Visit (HOSPITAL_COMMUNITY): Payer: Self-pay | Admitting: Oncology

## 2015-08-28 ENCOUNTER — Encounter (HOSPITAL_COMMUNITY): Payer: BLUE CROSS/BLUE SHIELD | Attending: Oncology

## 2015-08-28 ENCOUNTER — Encounter (HOSPITAL_COMMUNITY): Payer: BLUE CROSS/BLUE SHIELD

## 2015-08-28 DIAGNOSIS — D72829 Elevated white blood cell count, unspecified: Secondary | ICD-10-CM | POA: Insufficient documentation

## 2015-08-28 DIAGNOSIS — D509 Iron deficiency anemia, unspecified: Secondary | ICD-10-CM | POA: Insufficient documentation

## 2015-08-28 LAB — IRON AND TIBC
IRON: 73 ug/dL (ref 28–170)
Saturation Ratios: 25 % (ref 10.4–31.8)
TIBC: 288 ug/dL (ref 250–450)
UIBC: 215 ug/dL

## 2015-08-28 LAB — CBC WITH DIFFERENTIAL/PLATELET
Basophils Absolute: 0.1 10*3/uL (ref 0.0–0.1)
Basophils Relative: 0 %
Eosinophils Absolute: 0.4 10*3/uL (ref 0.0–0.7)
Eosinophils Relative: 3 %
HEMATOCRIT: 35.6 % — AB (ref 36.0–46.0)
HEMOGLOBIN: 11.7 g/dL — AB (ref 12.0–15.0)
LYMPHS ABS: 6.7 10*3/uL — AB (ref 0.7–4.0)
LYMPHS PCT: 51 %
MCH: 28.6 pg (ref 26.0–34.0)
MCHC: 32.9 g/dL (ref 30.0–36.0)
MCV: 87 fL (ref 78.0–100.0)
MONOS PCT: 4 %
Monocytes Absolute: 0.6 10*3/uL (ref 0.1–1.0)
NEUTROS ABS: 5.4 10*3/uL (ref 1.7–7.7)
NEUTROS PCT: 41 %
Platelets: 370 10*3/uL (ref 150–400)
RBC: 4.09 MIL/uL (ref 3.87–5.11)
RDW: 14.4 % (ref 11.5–15.5)
WBC: 13.1 10*3/uL — AB (ref 4.0–10.5)

## 2015-08-28 LAB — FERRITIN: FERRITIN: 84 ng/mL (ref 11–307)

## 2015-08-31 ENCOUNTER — Other Ambulatory Visit (HOSPITAL_COMMUNITY): Payer: Self-pay | Admitting: Oncology

## 2015-09-09 ENCOUNTER — Other Ambulatory Visit: Payer: Self-pay | Admitting: Family Medicine

## 2015-09-09 ENCOUNTER — Ambulatory Visit
Admission: RE | Admit: 2015-09-09 | Discharge: 2015-09-09 | Disposition: A | Discharge status: Home / Self Care | Payer: BLUE CROSS/BLUE SHIELD | Source: Ambulatory Visit | Attending: Internal Medicine | Admitting: Internal Medicine

## 2015-09-09 DIAGNOSIS — M810 Age-related osteoporosis without current pathological fracture: Secondary | ICD-10-CM

## 2015-09-09 DIAGNOSIS — Z13820 Encounter for screening for osteoporosis: Secondary | ICD-10-CM | POA: Diagnosis not present

## 2015-09-09 DIAGNOSIS — Z1231 Encounter for screening mammogram for malignant neoplasm of breast: Secondary | ICD-10-CM

## 2015-09-09 DIAGNOSIS — Z780 Asymptomatic menopausal state: Secondary | ICD-10-CM | POA: Diagnosis not present

## 2015-09-09 NOTE — Progress Notes (Signed)
Quick Note:  PT is aware. ______

## 2015-09-14 ENCOUNTER — Encounter (HOSPITAL_COMMUNITY): Payer: BLUE CROSS/BLUE SHIELD | Attending: Hematology & Oncology

## 2015-09-14 VITALS — BP 99/69 | HR 89 | Temp 97.7°F | Resp 16

## 2015-09-14 DIAGNOSIS — T7840XA Allergy, unspecified, initial encounter: Secondary | ICD-10-CM

## 2015-09-14 DIAGNOSIS — D509 Iron deficiency anemia, unspecified: Secondary | ICD-10-CM

## 2015-09-14 DIAGNOSIS — D72829 Elevated white blood cell count, unspecified: Secondary | ICD-10-CM | POA: Insufficient documentation

## 2015-09-14 MED ORDER — SODIUM CHLORIDE 0.9 % IV SOLN
Freq: Once | INTRAVENOUS | Status: AC
  Administered 2015-09-14: 14:00:00 via INTRAVENOUS

## 2015-09-14 MED ORDER — DIPHENHYDRAMINE HCL 50 MG/ML IJ SOLN
INTRAMUSCULAR | Status: AC
  Filled 2015-09-14: qty 1

## 2015-09-14 MED ORDER — METHYLPREDNISOLONE SODIUM SUCC 40 MG IJ SOLR
INTRAMUSCULAR | Status: AC
  Filled 2015-09-14: qty 1

## 2015-09-14 MED ORDER — DIPHENHYDRAMINE HCL 50 MG/ML IJ SOLN
50.0000 mg | INTRAMUSCULAR | Status: AC
  Administered 2015-09-14: 50 mg via INTRAVENOUS

## 2015-09-14 MED ORDER — SODIUM CHLORIDE 0.9 % IV SOLN
510.0000 mg | Freq: Once | INTRAVENOUS | Status: AC
  Administered 2015-09-14: 510 mg via INTRAVENOUS
  Filled 2015-09-14: qty 17

## 2015-09-14 MED ORDER — METHYLPREDNISOLONE SODIUM SUCC 40 MG IJ SOLR
40.0000 mg | INTRAMUSCULAR | Status: AC
  Administered 2015-09-14: 40 mg via INTRAVENOUS

## 2015-09-14 NOTE — Patient Instructions (Signed)
[place] at [facility] Discharge Instructions  RECOMMENDATIONS MADE BY THE CONSULTANT AND ANY TEST RESULTS WILL BE SENT TO YOUR REFERRING PHYSICIAN.  Feraheme 510 mg infusion given today as ordered. Return as scheduled.  Thank you for choosing [place] at [facility] to provide your oncology and hematology care.  To afford each patient quality time with our provider, please arrive at least 15 minutes before your scheduled appointment time.   Beginning January 23rd 2017 lab work for the [facility] will be done in the  Main lab at [facility] on 1st floor. If you have a lab appointment with the [facility] please come in thru the  Main Entrance and check in at the main information desk  You need to re-schedule your appointment should you arrive 10 or more minutes late.  We strive to give you quality time with our providers, and arriving late affects you and other patients whose appointments are after yours.  Also, if you no show three or more times for appointments you may be dismissed from the clinic at the providers discretion.     Again, thank you for choosing [facility].  Our hope is that these requests will decrease the amount of time that you wait before being seen by our physicians.       _____________________________________________________________  Should you have questions after your visit to [facility], please contact our office at [phone] between the hours of 8:30 a.m. and 4:30 p.m.  Voicemails left after 4:30 p.m. will not be returned until the following business day.  For prescription refill requests, have your pharmacy contact our office.         Resources For Cancer Patients and their Caregivers ? American Cancer Society: Can assist with transportation, wigs, general needs, runs Look Good Feel Better.        (502) 840-1521 ? Cancer Care: Provides financial assistance, online support  groups, medication/co-pay assistance.  1-800-813-HOPE 8652124193) ? [place] Assists [place] cancer patients and their families through emotional , educational and financial support.  220-192-8138 ? [place] DSS Where to apply for food stamps, Medicaid and utility assistance. 918-488-7212 ? RCATS: Transportation to medical appointments. (385)032-9404 ? Social Security Administration: May apply for disability if have a Stage IV cancer. (254)208-0308 740-339-4773 ? [employer], Disability and Transit Services: Assists with nutrition, care and transit needs. [place] Support Programs: @10RELATIVEDAYS @ > Cancer Support Group  2nd Tuesday of the month 1pm-2pm, Journey Room  > Creative Journey  3rd Tuesday of the month 1130am-1pm, Journey Room  > Look Good Feel Better  1st Wednesday of the month 10am-12 noon, Journey Room (Call [place] to register [phone])

## 2015-09-14 NOTE — Progress Notes (Signed)
1425 called to patients room. Patient sitting on toilet having a bowel movement. Complains that she is having an allergic reaction. States the whites of her eyes are red and she feels like she is spinning and her throat is closing up. Feraheme infusion is complete and NS currently infusing. Dr.Penland called to room. Orders received and carried out. 1435 patient escorted back to recliner. States she is feeling better and symptoms have subsided. 1445 patient states she feels ok just sleepy. 1600 patient states that she feels ok and denies any complaints at present. Ambulatory on discharge home to self.

## 2015-09-21 ENCOUNTER — Ambulatory Visit (HOSPITAL_COMMUNITY)
Admission: RE | Admit: 2015-09-21 | Discharge: 2015-09-21 | Disposition: A | Discharge status: Home / Self Care | Payer: BLUE CROSS/BLUE SHIELD | Source: Ambulatory Visit | Attending: Family Medicine | Admitting: Family Medicine

## 2015-09-21 DIAGNOSIS — Z1231 Encounter for screening mammogram for malignant neoplasm of breast: Secondary | ICD-10-CM | POA: Insufficient documentation

## 2015-09-25 ENCOUNTER — Ambulatory Visit (HOSPITAL_COMMUNITY): Payer: Self-pay

## 2015-09-27 ENCOUNTER — Other Ambulatory Visit: Payer: Self-pay | Admitting: Family Medicine

## 2015-09-29 DIAGNOSIS — G4733 Obstructive sleep apnea (adult) (pediatric): Secondary | ICD-10-CM | POA: Diagnosis not present

## 2015-10-01 ENCOUNTER — Other Ambulatory Visit: Payer: Self-pay | Admitting: Family Medicine

## 2015-10-02 ENCOUNTER — Other Ambulatory Visit: Payer: Self-pay | Admitting: Family Medicine

## 2015-10-06 ENCOUNTER — Encounter (HOSPITAL_COMMUNITY): Payer: Self-pay | Admitting: Oncology

## 2015-10-06 ENCOUNTER — Encounter (HOSPITAL_COMMUNITY): Payer: BLUE CROSS/BLUE SHIELD | Attending: Oncology | Admitting: Oncology

## 2015-10-06 ENCOUNTER — Encounter (HOSPITAL_COMMUNITY): Payer: BLUE CROSS/BLUE SHIELD

## 2015-10-06 DIAGNOSIS — D72829 Elevated white blood cell count, unspecified: Secondary | ICD-10-CM

## 2015-10-06 DIAGNOSIS — D509 Iron deficiency anemia, unspecified: Secondary | ICD-10-CM | POA: Diagnosis not present

## 2015-10-06 DIAGNOSIS — K5010 Crohn's disease of large intestine without complications: Secondary | ICD-10-CM

## 2015-10-06 DIAGNOSIS — K50119 Crohn's disease of large intestine with unspecified complications: Secondary | ICD-10-CM

## 2015-10-06 MED ORDER — HEPARIN SOD (PORK) LOCK FLUSH 100 UNIT/ML IV SOLN
INTRAVENOUS | Status: AC
  Filled 2015-10-06: qty 5

## 2015-10-06 NOTE — Patient Instructions (Signed)
[place] at [facility] Discharge Instructions  RECOMMENDATIONS MADE BY THE CONSULTANT AND ANY TEST RESULTS WILL BE SENT TO YOUR REFERRING PHYSICIAN.  Exam done and seen today by Kirby Crigler Continue Niferex daily Labs in 4 weeks Labs every 6 months Port flush today and then every 8 weeks Return to see the Doctor in 12 months for follow up Call the clinic for any concerns or questions.  Thank you for choosing [place] at [facility] to provide your oncology and hematology care.  To afford each patient quality time with our provider, please arrive at least 15 minutes before your scheduled appointment time.   Beginning January 23rd 2017 lab work for the [facility] will be done in the  Main lab at [facility] on 1st floor. If you have a lab appointment with the [facility] please come in thru the  Main Entrance and check in at the main information desk  You need to re-schedule your appointment should you arrive 10 or more minutes late.  We strive to give you quality time with our providers, and arriving late affects you and other patients whose appointments are after yours.  Also, if you no show three or more times for appointments you may be dismissed from the clinic at the providers discretion.     Again, thank you for choosing [facility].  Our hope is that these requests will decrease the amount of time that you wait before being seen by our physicians.       _____________________________________________________________  Should you have questions after your visit to [facility], please contact our office at [phone] between the hours of 8:30 a.m. and 4:30 p.m.  Voicemails left after 4:30 p.m. will not be returned until the following business day.  For prescription refill requests, have your pharmacy contact our office.         Resources For Cancer Patients and their Caregivers ? American Cancer  Society: Can assist with transportation, wigs, general needs, runs Look Good Feel Better.        312 599 5729 ? Cancer Care: Provides financial assistance, online support groups, medication/co-pay assistance.  1-800-813-HOPE (640) 340-5531) ? [place] Assists [place] cancer patients and their families through emotional , educational and financial support.  234-824-0864 ? [place] DSS Where to apply for food stamps, Medicaid and utility assistance. 808-233-9495 ? RCATS: Transportation to medical appointments. (610) 402-0754 ? Social Security Administration: May apply for disability if have a Stage IV cancer. (828) 322-2084 514-038-9465 ? [employer], Disability and Transit Services: Assists with nutrition, care and transit needs. [place] Support Programs: @10RELATIVEDAYS @ > Cancer Support Group  2nd Tuesday of the month 1pm-2pm, Journey Room  > Creative Journey  3rd Tuesday of the month 1130am-1pm, Journey Room  > Look Good Feel Better  1st Wednesday of the month 10am-12 noon, Journey Room (Call [place] to register [phone])

## 2015-10-06 NOTE — Assessment & Plan Note (Addendum)
Leukocytosis, secondary to Crohn's disease and/or lung disease due to tobacco abuse. Negative flow cytometry, negative multiple myeloma panel. Crohn's disease, on Humira, in remission. Managed by Dr. Gala Romney. Tobacco abuse, quit, March 2014. Port-a-cath due to poor veinous access.  Labs in 4 weeks, 6, and 12 months: CBC diff.  Return in 12 months for follow-up.

## 2015-10-06 NOTE — Assessment & Plan Note (Addendum)
Iron deficiency anemia, secondary to Crohn's Disease.  She is on Niferex which she is tolerating well without any complaints.  Oncology Flowsheet 09/14/2015  ferumoxytol [facility]) IV 510 mg    Labs today on 08/28/2015: CBC diff, iron/TIBC, ferritin.  I personally reviewed and went over laboratory results with the patient.  As a result of her labs in June, she was set-up for Feraheme infusion and that was administered as noted above.  UNFORTUNATELY, the patient experienced a moderate anaphylaxis reaction to Feraheme.  As a result, in the future, we will need to use a different IV iron preparation, such as Injectafer.  Feraheme is added to her allergy list.  Over the past 12 months, she has required 1 IV iron infusion.  Labs in 4 weeks: CBC diff, iron/TIBC, ferritin.  Labs in 6 and 12 months: CBC diff, iron/TIBC, ferritin.  She has a port in place and it is noted that no one has maintained her port in years.  She notes that it was placed by Dr. Arnoldo Morale in 2008/2009 due to poor IV access.  Historically, we have been ascertaining labs peripherally along with her IV iron infusions.  Nursing was asked to flush the port.  Nursing is unable to push anything through port.  As a result, we will refer her to Dr. Arnoldo Morale for further evaluation and management.  Return in 12 months for follow-up.
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
## 2015-10-06 NOTE — Assessment & Plan Note (Signed)
Crohn's disease, followed by Dr. Gala Romney.  On Humira.  I personally reviewed and went over radiographic studies with the patient.  The results are noted within this dictation.  Bone density is reviewed.  The patient reports that she is scheduled for colonoscopy in ~ 6 months.

## 2015-10-06 NOTE — Progress Notes (Signed)
Attempted to flush portacath per Kirby Crigler PA request but was unsuccessful.Unable to flush any saline thru. Port has not been used since 2008 per pt.Gershon Mussel was notified.

## 2015-10-07 ENCOUNTER — Other Ambulatory Visit: Payer: Self-pay | Admitting: Gastroenterology

## 2015-10-23 DIAGNOSIS — E559 Vitamin D deficiency, unspecified: Secondary | ICD-10-CM | POA: Diagnosis not present

## 2015-10-23 DIAGNOSIS — Z1159 Encounter for screening for other viral diseases: Secondary | ICD-10-CM | POA: Diagnosis not present

## 2015-10-23 DIAGNOSIS — E669 Obesity, unspecified: Secondary | ICD-10-CM | POA: Diagnosis not present

## 2015-10-23 DIAGNOSIS — D509 Iron deficiency anemia, unspecified: Secondary | ICD-10-CM | POA: Diagnosis not present

## 2015-10-23 LAB — COMPREHENSIVE METABOLIC PANEL
ALK PHOS: 60 U/L (ref 33–115)
ALT: 14 U/L (ref 6–29)
AST: 12 U/L (ref 10–35)
Albumin: 3.2 g/dL — ABNORMAL LOW (ref 3.6–5.1)
BUN: 8 mg/dL (ref 7–25)
CHLORIDE: 104 mmol/L (ref 98–110)
CO2: 25 mmol/L (ref 20–31)
CREATININE: 0.71 mg/dL (ref 0.50–1.10)
Calcium: 8.5 mg/dL — ABNORMAL LOW (ref 8.6–10.2)
Glucose, Bld: 81 mg/dL (ref 65–99)
Potassium: 4.2 mmol/L (ref 3.5–5.3)
SODIUM: 133 mmol/L — AB (ref 135–146)
TOTAL PROTEIN: 6.5 g/dL (ref 6.1–8.1)
Total Bilirubin: 0.3 mg/dL (ref 0.2–1.2)

## 2015-10-23 LAB — LIPID PANEL
CHOLESTEROL: 132 mg/dL (ref 125–200)
HDL: 52 mg/dL (ref >=46)
LDL Cholesterol: 52 mg/dL (ref <130)
Total CHOL/HDL Ratio: 2.5 Ratio (ref <=5.0)
Triglycerides: 142 mg/dL (ref <150)
VLDL: 28 mg/dL (ref <30)

## 2015-10-23 LAB — TSH: TSH: 0.94 mIU/L

## 2015-10-24 LAB — HIV ANTIBODY (ROUTINE TESTING W REFLEX): HIV: NONREACTIVE

## 2015-10-24 LAB — VITAMIN D 25 HYDROXY (VIT D DEFICIENCY, FRACTURES): VIT D 25 HYDROXY: 27 ng/mL — AB (ref 30–100)

## 2015-11-03 DIAGNOSIS — T85898A Other specified complication of other internal prosthetic devices, implants and grafts, initial encounter: Secondary | ICD-10-CM | POA: Diagnosis not present

## 2015-11-03 NOTE — H&P (Signed)
  NTS SOAP Note  Vital Signs:  Vitals as of: 4/51/4604: Systolic 799: Diastolic 76: Heart Rate 86: Temp 98.57F (Temporal): Height 64f 7.5in: Weight 202Lbs 0 Ounces: BMI 31.17   BMI : 31.17 kg/m2  Subjective: This 45year old female presents for of need for removal of portacath.  Does not flush.  Referred by oncology.  Placed by myself in 2009.  Review of Symptoms:  Head:negative Eyes:negnegativeative Nose/Mouth/Throat:negative Cardiovascular:negative Respiratory:negative Gastrointestinabdominal pain, nausea, heartburn Genitourinary:negative joint and neck pain Skin:negative Hematolgic/Lymphatic:negative Allergic/Immunologic:negative   Past Medical History:Reviewed  Past Medical History  Surgical History: shoulder surgery, portacath placement 2009, colon surgery, neck surgery Medical Problems: asthma, crohn's disease Allergies: shellfish, oxycodone, tramadol Medications: humira, kcl, prilosec, wellbutrin, claritin, MVI   Social History:Reviewed  Social History  Preferred Language: English Race:  Black or African American Ethnicity: Not Hispanic / Latino Age: [age] Marital Status:  M Alcohol: no   Smoking Status: Former smoker reviewed on 11/03/2015 Started Date:  Stopped Date: 05/06/2011 Functional Status reviewed on 11/03/2015 ------------------------------------------------ Bathing: Normal Cooking: Normal Dressing: Normal Driving: Normal Eating: Normal Managing Meds: Normal Oral Care: Normal Shopping: Normal Toileting: Normal Transferring: Normal Walking: Normal Cognitive Status reviewed on 11/03/2015 ------------------------------------------------ Attention: Normal Decision Making: Normal Language: Normal Memory: Normal Motor: Normal Perception: Normal Problem Solving: Normal Visual and Spatial: Normal   Family History:Reviewed  Family Health History Mother, Living; Chronic obstructive lung disease (COPD);  Father      Objective Information: General:Well appearing, well nourished in no distress. Portacath in place, left upper chest Neck:Supple without lymphadenopathy.  Heart:RRR, no murmur or gallop.  Normal S1, S2.  No S3, S4.  Lungs:CTA bilaterally, no wheezes, rhonchi, rales.  Breathing unlabored.  Assessment:Dysfunctional portacath  Diagnoses: 9872.15 [id number] of internal prosthetic device (Other specified complication of other internal prosthetic devices, implants and grafts, initial encounter)  Procedures: [id number]- OFFICE OUTPATIENT NEW 20 MINUTES    Plan:  Scheduled for portacath removal in minor procedure room on 11/11/15.   Patient Education:Alternative treatments to surgery were discussed with patient (and family).Risks and benefits  of procedure were fully explained to the patient (and family) who gave informed consent. Patient/family questions were addressed.  Follow-up:Pending Surgery

## 2015-11-04 ENCOUNTER — Telehealth: Payer: Self-pay | Admitting: Family Medicine

## 2015-11-04 ENCOUNTER — Encounter: Payer: BLUE CROSS/BLUE SHIELD | Admitting: Family Medicine

## 2015-11-04 ENCOUNTER — Encounter: Payer: Self-pay | Admitting: Family Medicine

## 2015-11-04 NOTE — Telephone Encounter (Signed)
Pt called to reschedule her apt on 11/05/15 because her husband had a physical therapy appointment. She was extremely rude to [place] - telling her that we can't even get a schedule correct and it wasn't her fault she had to reschedule since she had to wait so long this morning.  Serena Colonel put the patient on hold and told me the situation. I spoke with Dr. Moshe Cipro, she agreed to reschedule the patient.  As I spoke with Ms. Soulliere, she continued to be uncooperative and disrespectful. I continued to apologize for the wait time this morning; however, I explained that the next available apt for Dr. Moshe Cipro is 11/24/15 due to a light schedule next week and Dr. Moshe Cipro being off the week of the 11th for CME.   Ms. Slaydon continued to get louder and more hostile saying that it wasn't her fault that she had to reschedule today because she wasn't late for her apt. I again explained to her that we were doing all we could to get her seen but the morning patients had ran longer than expected. We offered her to reschedule numerous times before she finally got up and said she couldn't wait anymore.  Her main concern on the phone was that her refills would be delayed; however, I assured her that we will refill her meds as long as she comes on the 19th.   In 3 instances today the patient refused to be cooperate with staff and was disrupting the other patients in the waiting room.   Dr. Moshe Cipro and myself are going to speak with her about her behavior towards the staff.

## 2015-11-04 NOTE — Telephone Encounter (Signed)
Patient came into for an AWV and Physical. She checked in at 10:30am - at 10:45 she became irritated and was complaining about the wait time. Serena Colonel explained to her that we were running behind but if she couldn't stay we could reschedule her.   I had to get involved and explain to her that she had two patients in front of her - I apologized for the wait time and offered her an apt for the next day.   After a long list of rude comments she finally took the 2pm apt for 11/05/15.

## 2015-11-05 ENCOUNTER — Telehealth: Payer: Self-pay | Admitting: Family Medicine

## 2015-11-05 ENCOUNTER — Encounter: Payer: BLUE CROSS/BLUE SHIELD | Admitting: Family Medicine
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
## 2015-11-06 ENCOUNTER — Other Ambulatory Visit (HOSPITAL_COMMUNITY): Payer: Self-pay

## 2015-11-10 ENCOUNTER — Encounter: Payer: Self-pay | Admitting: Family Medicine

## 2015-11-11 ENCOUNTER — Ambulatory Visit (HOSPITAL_COMMUNITY)
Admission: RE | Admit: 2015-11-11 | Discharge: 2015-11-11 | Disposition: A | Discharge status: Home / Self Care | Payer: BLUE CROSS/BLUE SHIELD | Source: Ambulatory Visit | Attending: General Surgery | Admitting: General Surgery

## 2015-11-11 ENCOUNTER — Other Ambulatory Visit (HOSPITAL_COMMUNITY): Payer: Self-pay

## 2015-11-11 ENCOUNTER — Encounter (HOSPITAL_COMMUNITY): Payer: Self-pay | Admitting: *Deleted

## 2015-11-11 ENCOUNTER — Encounter (HOSPITAL_COMMUNITY)
Admission: RE | Disposition: A | Discharge status: Home / Self Care | Payer: Self-pay | Source: Ambulatory Visit | Attending: General Surgery

## 2015-11-11 ENCOUNTER — Encounter (HOSPITAL_COMMUNITY): Payer: BLUE CROSS/BLUE SHIELD | Attending: Oncology

## 2015-11-11 DIAGNOSIS — Z79899 Other long term (current) drug therapy: Secondary | ICD-10-CM | POA: Diagnosis not present

## 2015-11-11 DIAGNOSIS — T82598A Other mechanical complication of other cardiac and vascular devices and implants, initial encounter: Secondary | ICD-10-CM | POA: Insufficient documentation

## 2015-11-11 DIAGNOSIS — T85898D Other specified complication of other internal prosthetic devices, implants and grafts, subsequent encounter: Secondary | ICD-10-CM | POA: Diagnosis not present

## 2015-11-11 DIAGNOSIS — D509 Iron deficiency anemia, unspecified: Secondary | ICD-10-CM | POA: Insufficient documentation

## 2015-11-11 DIAGNOSIS — Y838 Other surgical procedures as the cause of abnormal reaction of the patient, or of later complication, without mention of misadventure at the time of the procedure: Secondary | ICD-10-CM | POA: Insufficient documentation

## 2015-11-11 DIAGNOSIS — Z87891 Personal history of nicotine dependence: Secondary | ICD-10-CM | POA: Insufficient documentation

## 2015-11-11 HISTORY — PX: PORT-A-CATH REMOVAL: SHX5289

## 2015-11-11 LAB — FERRITIN: FERRITIN: 138 ng/mL (ref 11–307)

## 2015-11-11 LAB — CBC
HEMATOCRIT: 36.1 % (ref 36.0–46.0)
HEMOGLOBIN: 11.9 g/dL — AB (ref 12.0–15.0)
MCH: 28.1 pg (ref 26.0–34.0)
MCHC: 33 g/dL (ref 30.0–36.0)
MCV: 85.3 fL (ref 78.0–100.0)
Platelets: 338 10*3/uL (ref 150–400)
RBC: 4.23 MIL/uL (ref 3.87–5.11)
RDW: 15.4 % (ref 11.5–15.5)
WBC: 15.1 10*3/uL — AB (ref 4.0–10.5)

## 2015-11-11 SURGERY — REMOVAL PORT-A-CATH
Anesthesia: LOCAL | Laterality: Left

## 2015-11-11 MED ORDER — CHLORHEXIDINE GLUCONATE CLOTH 2 % EX PADS: 6.0000 | MEDICATED_PAD | Freq: Once | CUTANEOUS | Status: DC

## 2015-11-11 MED ORDER — HYDROCODONE-ACETAMINOPHEN 5-325 MG PO TABS
1.0000 | ORAL_TABLET | ORAL | 0 refills | Status: DC | PRN
Start: 2015-11-11 — End: 2015-11-30

## 2015-11-11 MED ORDER — LIDOCAINE HCL (PF) 1 % IJ SOLN
INTRAMUSCULAR | Status: DC | PRN
  Administered 2015-11-11: 3 mg

## 2015-11-11 MED ORDER — LIDOCAINE HCL (PF) 1 % IJ SOLN
INTRAMUSCULAR | Status: AC
  Filled 2015-11-11: qty 30

## 2015-11-11 SURGICAL SUPPLY — 17 items
ADH SKN CLS APL DERMABOND .7 (GAUZE/BANDAGES/DRESSINGS) ×1
BAG HAMPER (MISCELLANEOUS) ×2 IMPLANT
CHLORAPREP W/TINT 10.5 ML (MISCELLANEOUS) ×2 IMPLANT
CLOTH BEACON ORANGE TIMEOUT ST (SAFETY) ×2 IMPLANT
DECANTER SPIKE VIAL GLASS SM (MISCELLANEOUS) ×2 IMPLANT
DERMABOND ADVANCED (GAUZE/BANDAGES/DRESSINGS) ×1
DERMABOND ADVANCED .7 DNX12 (GAUZE/BANDAGES/DRESSINGS) ×1 IMPLANT
DRAPE PROXIMA HALF (DRAPES) ×2 IMPLANT
ELECT REM PT RETURN 9FT ADLT (ELECTROSURGICAL) ×2
ELECTRODE REM PT RTRN 9FT ADLT (ELECTROSURGICAL) ×1 IMPLANT
GLOVE BIOGEL PI IND STRL 7.0 (GLOVE) ×1 IMPLANT
GLOVE BIOGEL PI INDICATOR 7.0 (GLOVE) ×1
GLOVE SURG SS PI 7.5 STRL IVOR (GLOVE) ×2 IMPLANT
GOWN STRL REUS W/TWL LRG LVL3 (GOWN DISPOSABLE) ×2 IMPLANT
SUT VIC AB 3-0 SH 27 (SUTURE) ×2
SUT VIC AB 3-0 SH 27X BRD (SUTURE) ×1 IMPLANT
SUT VIC AB 4-0 PS2 27 (SUTURE) ×2 IMPLANT

## 2015-11-11 NOTE — Interval H&P Note (Signed)
History and Physical Interval Note:  11/11/2015 9:38 AM  [name]  has presented today for surgery, with the diagnosis of dysfunctional port,crohn's disease  The various methods of treatment have been discussed with the patient and family. After consideration of risks, benefits and other options for treatment, the patient has consented to  Procedure(s): REMOVAL PORT-A-CATH (Left) as a surgical intervention .  The patient's history has been reviewed, patient examined, no change in status, stable for surgery.  I have reviewed the patient's chart and labs.  Questions were answered to the patient's satisfaction.     Aviva Signs A

## 2015-11-11 NOTE — Discharge Instructions (Signed)
Incision Care °An incision is when a surgeon cuts into your body. After surgery, the incision needs to be cared for properly to prevent infection.  °HOW TO CARE FOR YOUR INCISION °· Take medicines only as directed by your health care provider. °· There are many different ways to close and cover an incision, including stitches, skin glue, and adhesive strips. Follow your health care provider's instructions on: °¨ Incision care. °¨ Bandage (dressing) changes and removal. °¨ Incision closure removal. °· Do not take baths, swim, or use a hot tub until your health care provider approves. You may shower as directed by your health care provider. °· Resume your normal diet and activities as directed. °· Use anti-itch medicine (such as an antihistamine) as directed by your health care provider. The incision may itch while it is healing. Do not pick or scratch at the incision. °· Drink enough fluid to keep your urine clear or pale yellow. °SEEK MEDICAL CARE IF:  °· You have drainage, redness, swelling, or pain at your incision site. °· You have muscle aches, chills, or a general ill feeling. °· You notice a bad smell coming from the incision or dressing. °· Your incision edges separate after the sutures, staples, or skin adhesive strips have been removed. °· You have persistent nausea or vomiting. °· You have a fever. °· You are dizzy. °SEEK IMMEDIATE MEDICAL CARE IF:  °· You have a rash. °· You faint. °· You have difficulty breathing. °MAKE SURE YOU:  °· Understand these instructions. °· Will watch your condition. °· Will get help right away if you are not doing well or get worse. °  °This information is not intended to replace advice given to you by your health care provider. Make sure you discuss any questions you have with your health care provider. °  °Document Released: 09/10/2004 Document Revised: 03/14/2014 Document Reviewed: 04/17/2013 °Elsevier Interactive Patient Education ©2016 Elsevier Inc[employer] ° °

## 2015-11-11 NOTE — Op Note (Signed)
Patient:  [name]  DOB:  1970/04/30  MRN:  [id number]   Preop Diagnosis:  Dysfunctional Port-A-Cath  Postop Diagnosis:  Same  Procedure:  Removal Port-A-Cath  Surgeon:  Aviva Signs, M.D.  Anes:  Local  Indications:  Patient is a 45 year old black female status post Port-A-Cath placement in the remote past who now presents for Port-A-Cath removal. A Port-A-Cath has been dysfunctional and she has no need for it to remain in place. The risks and benefits of the procedure were fully explained to the patient, who gave informed consent.  Procedure note:  The patient was placed the supine position. The left upper chest was prepped and draped using usual sterile technique with DuraPrep. Surgical site confirmation was performed. 1% Xylocaine was used for local anesthesia.  An incision was made to the previous surgical incision site. Dissection was taken down to the port. The Port-A-Cath was removed in total without difficulty. It was disposed of. The subcutaneous layer was reapproximated using 3-0 Vicryl interrupted suture. The skin was closed using a 4 Vicryl subcuticular suture. Dermabond was then applied.  All tape and needle counts were correct the end of the procedure. Patient was discharged from the minor procedure room in stable condition.  Complications:  None  EBL:  Minimal  Specimen:  None

## 2015-11-12 ENCOUNTER — Telehealth (HOSPITAL_COMMUNITY): Payer: Self-pay | Admitting: *Deleted

## 2015-11-12 NOTE — Telephone Encounter (Signed)
-----   Message from [facility], PA-C sent at 11/11/2015  5:02 PM EDT ----- Kermit Balo

## 2015-11-12 NOTE — Telephone Encounter (Signed)
LMOM that labs were good.

## 2015-11-13 ENCOUNTER — Other Ambulatory Visit: Payer: Self-pay

## 2015-11-16 ENCOUNTER — Encounter (HOSPITAL_COMMUNITY): Payer: Self-pay | Admitting: General Surgery

## 2015-11-16 MED ORDER — OMEPRAZOLE 20 MG PO CPDR: DELAYED_RELEASE_CAPSULE | ORAL | 3 refills | Status: DC

## 2015-11-17 ENCOUNTER — Encounter: Payer: Self-pay | Admitting: Family Medicine

## 2015-11-17 ENCOUNTER — Ambulatory Visit (INDEPENDENT_AMBULATORY_CARE_PROVIDER_SITE_OTHER): Payer: BLUE CROSS/BLUE SHIELD | Admitting: Family Medicine

## 2015-11-17 VITALS — BP 110/70 | HR 64 | Resp 14 | Ht 67.0 in | Wt 203.0 lb

## 2015-11-17 DIAGNOSIS — Z0000 Encounter for general adult medical examination without abnormal findings: Secondary | ICD-10-CM

## 2015-11-17 DIAGNOSIS — Z1211 Encounter for screening for malignant neoplasm of colon: Secondary | ICD-10-CM | POA: Diagnosis not present

## 2015-11-17 DIAGNOSIS — Z23 Encounter for immunization: Secondary | ICD-10-CM

## 2015-11-17 LAB — HEMOCCULT GUIAC POC 1CARD (OFFICE): FECAL OCCULT BLD: NEGATIVE

## 2015-11-17 NOTE — Assessment & Plan Note (Signed)

## 2015-11-17 NOTE — Progress Notes (Signed)
    [name]     MRN: [id number]      DOB: 1970-10-13  HPI: Patient is in for annual physical exam. No other health concerns are expressed or addressed at the visit. Recent labs, if available are reviewed. Immunization is reviewed , and  updated if needed.   PE: Pleasant  female, alert and oriented x 3, in no cardio-pulmonary distress. Afebrile. HEENT No facial trauma or asymetry. Sinuses non tender.  Extra occullar muscles intact, pupils equally reactive to light. External ears normal, tympanic membranes clear. Oropharynx moist, no exudate. Neck: supple, no adenopathy,JVD or thyromegaly.No bruits.  Chest: Clear to ascultation bilaterally.No crackles or wheezes. Non tender to palpation  Breast: No asymetry,no masses or lumps. No tenderness. No nipple discharge or inversion. No axillary or supraclavicular adenopathy  Cardiovascular system; Heart sounds normal,  S1 and  S2 ,no S3.  No murmur, or thrill. Apical beat not displaced Peripheral pulses normal.  Abdomen: Soft, non tender, no organomegaly or masses. No bruits. Bowel sounds normal. No guarding, tenderness or rebound.  Rectal:  Normal sphincter tone. No rectal mass. Guaiac negative stool.  GU: External genitalia normal female genitalia , normal female distribution of hair. No lesions. Urethral meatus normal in size, no  Prolapse, no lesions visibly  Present. Bladder non tender. Vagina pink and moist , with no visible lesions , discharge present . Adequate pelvic support no  cystocele or rectocele noted Cervix pink and appears healthy, no lesions or ulcerations noted, no discharge noted from os Uterus normal size, no adnexal masses, no cervical motion or adnexal tenderness.   Musculoskeletal exam: Full ROM of spine, hips , shoulders and knees. No deformity ,swelling or crepitus noted. No muscle wasting or atrophy.   Neurologic: Cranial nerves 2 to 12 intact. Power, tone ,sensation and reflexes  normal throughout. No disturbance in gait. No tremor.  Skin: Intact, no ulceration, erythema , scaling or rash noted. Pigmentation normal throughout  Psych; Normal mood and affect. Judgement and concentration normal   Assessment & Plan:  No problem-specific Assessment & Plan notes found for this encounter.

## 2015-11-17 NOTE — Patient Instructions (Addendum)
Excellent exam and labs, no med changes  F/u in 6 months  It is important that you exercise regularly at least 30 minutes 5 times a week. If you develop chest pain, have severe difficulty breathing, or feel very tired, stop exercising immediately and seek medical attention   Please work on good  health habits so that your health will improve. 1. Commitment to daily physical activity for 30 to 60  minutes, if you are able to do this.  2. Commitment to wise food choices. Aim for half of your  food intake to be vegetable and fruit, one quarter starchy foods, and one quarter protein. Try to eat on a regular schedule  3 meals per day, snacking between meals should be limited to vegetables or fruits or small portions of nuts. 64 ounces of water per day is generally recommended, unless you have specific health conditions, like heart failure or kidney failure where you will need to limit fluid intake.  3. Commitment to sufficient and a  good quality of physical and mental rest daily, generally between 6 to 8 hours per day.  WITH PERSISTANCE AND PERSEVERANCE, THE IMPOSSIBLE , BECOMES THE NORM! Flu vaccine toda

## 2015-11-17 NOTE — Assessment & Plan Note (Signed)
After obtaining informed consent, the vaccine is  administered by LPN.  

## 2015-11-18 NOTE — Assessment & Plan Note (Signed)
Annual wellness as documented Healthy lifestyle discussed, goals set for weight loss of 10 pounds in 6 months.

## 2015-11-18 NOTE — Progress Notes (Signed)
Preventive Screening-Counseling & Management   Patient present here today for a Medicare annual wellness visit.   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 Former smoker, denies alcohol or illicit drug use   Risk Factors  Current exercise habits:  3 to 5 days per week on average 150 mins   Dietary issues discussed:heart healthy , low fat diet with limited concentrated sweets rich in fruit, vegetable and fiber and legumes   Cardiac risk factors: None significant  Depression Screen  (Note: if answer to either of the following is "Yes", a more complete depression screening is indicated)   Over the past two weeks, have you felt down, depressed or hopeless? No  Over the past two weeks, have you felt little interest or pleasure in doing things? No  Have you lost interest or pleasure in daily life? No  Do you often feel hopeless? No  Do you cry easily over simple problems? No   Activities of Daily Living  In your present state of health, do you have any difficulty performing the following activities?  Driving?: No Managing money?: No Feeding yourself?:No Getting from bed to chair?:No Climbing a flight of stairs?:No Preparing food and eating?:No Bathing or showering?:No Getting dressed?:No Getting to the toilet?:No Using the toilet?:No Moving around from place to place?: No  Fall Risk Assessment In the past year have you fallen or had a near fall?:No Are you currently taking any medications that make you dizzy?:No   Hearing Difficulties: No Do you often ask people to speak up or repeat themselves?:No Do you experience ringing or noises in your ears?:No Do you have difficulty understanding soft or whispered voices?:No  Cognitive Testing  Alert? Yes Normal Appearance?Yes  Oriented to person? Yes Place? Yes  Time? Yes   Displays appropriate judgment?Yes  Can read the correct time from a watch face? yes Are you having problems remembering things?No  Advanced Directives have been discussed with the patient?Yes    List the Names of Other Physician/Practitioners you currently use:    Indicate any recent Medical Services you may have received from other than [facility] providers in the past year (date may be approximate).  Oncology f/u leukocytosis removal of porto cath with Dr Arnoldo Morale, Dr Harrington Challenger , mental health, Dr Gala Romney, [facility]   Medicare Attestation  I have personally reviewed:  The patient's medical and social history  Their use of alcohol, tobacco or illicit drugs  Their current medications and supplements  The patient's functional ability including ADLs,fall risks, home safety risks, cognitive, and hearing and visual impairment  Diet and physical activities  Evidence for depression or mood disorders  The patient's weight, height, BMI, and visual acuity have been recorded in the chart. I have made referrals, counseling, and provided education to the patient based on review of the above and I have provided the patient with a written personalized care plan for preventive services.    Physical Exam BP 110/70 (BP Location: Left Arm, Cuff Size: Normal)   Pulse 64   Resp 14   Ht 5' 7"  (1.702 m)   Wt 203 lb (92.1 kg)   LMP 11/04/2015   SpO2 98%   BMI 31.79 kg/m    Assessment & Plan:  Annual physical exam Annual exam as documented. Counseling done  re healthy lifestyle involving commitment to 150 minutes exercise per week, heart healthy diet,  and attaining healthy weight.The importance of adequate sleep also discussed. Regular seat belt use and home safety, is also discussed. Changes in health habits are decided on by the patient with goals and time frames  set for achieving them. Immunization and cancer screening needs are specifically addressed at this visit.   Need for prophylactic vaccination  and inoculation against influenza After obtaining informed consent, the vaccine is  administered by LPN.   Medicare annual wellness visit, subsequent Annual wellness as documented Healthy lifestyle discussed, goals set for weight loss of 10 pounds in 6 months.

## 2015-11-20 ENCOUNTER — Ambulatory Visit (HOSPITAL_COMMUNITY): Payer: Self-pay | Admitting: Psychiatry

## 2015-11-23 ENCOUNTER — Other Ambulatory Visit: Payer: Self-pay | Admitting: Gastroenterology

## 2015-11-24 ENCOUNTER — Encounter: Payer: BLUE CROSS/BLUE SHIELD | Admitting: Family Medicine

## 2015-11-30 ENCOUNTER — Ambulatory Visit (INDEPENDENT_AMBULATORY_CARE_PROVIDER_SITE_OTHER): Payer: BLUE CROSS/BLUE SHIELD | Admitting: Psychiatry

## 2015-11-30 ENCOUNTER — Encounter (HOSPITAL_COMMUNITY): Payer: Self-pay | Admitting: Psychiatry

## 2015-11-30 VITALS — BP 116/63 | HR 81 | Ht 67.0 in | Wt 202.0 lb

## 2015-11-30 DIAGNOSIS — Z79899 Other long term (current) drug therapy: Secondary | ICD-10-CM

## 2015-11-30 DIAGNOSIS — F321 Major depressive disorder, single episode, moderate: Secondary | ICD-10-CM | POA: Diagnosis not present

## 2015-11-30 MED ORDER — BUPROPION HCL ER (XL) 150 MG PO TB24: 150.0000 mg | ORAL_TABLET | Freq: Two times a day (BID) | ORAL | 3 refills | Status: DC

## 2015-11-30 MED ORDER — TRAZODONE HCL 100 MG PO TABS: 100.0000 mg | ORAL_TABLET | Freq: Every day | ORAL | 3 refills | Status: DC

## 2015-11-30 NOTE — Progress Notes (Signed)
Patient ID: [name], female   DOB: May 08, 1970, 45 y.o.   MRN: [id number] Patient ID: [name], female   DOB: February 12, 1971, 45 y.o.   MRN: [id number] Patient ID: [name], female   DOB: February 27, 1971, 45 y.o.   MRN: [id number] Patient ID: [name], female   DOB: [birth year], 45 y.o.   MRN: [id number] Patient ID: [name], female   DOB: [birth year], 45 y.o.   MRN: [id number] Patient ID: [name], female   DOB: 03/26/1970, 45 y.o.   MRN: [id number] Patient ID: [name], female   DOB: May 20, 1970, 45 y.o.   MRN: [id number] Patient ID: [name], female   DOB: 1970-10-18, 45 y.o.   MRN: [id number] Patient ID: [name], female   DOB: 1971/02/10, 45 y.o.   MRN: [id number] Patient ID: VICKI CHAFFIN, female   DOB: [birth year], 45 y.o.   MRN: [id number]  [place] Progress Note  DARL BRISBIN [id number] 45 y.o.  11/30/2015 2:15 PM  Chief Complaint: "I've been doing better  History of Present Illness:  This patient is a 45 year old black female who is married and lives with her 2 children and husband in [place]. She is on disability for Crohn's disease.  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 She got severely constipated with a high fever . Once it was determined what was wrong she's had an up-and-down course with her Crohn's. Currently is under good control because she has figured out which foods make it worse. The patient got depressed after developing Crohn's and was hospitalized twice at behavioral health hospital in the past. this was complicated by the fact that she was abusing marijuana.  The patient returns after 4 months. 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 She is enjoying these very [name]. By the end of next spring she will earned an associates degree in business. She states that it helps her to keep her mind busy so she doesn't ruminate about other things. For the most  part she sleeping well and her mood is good and she is focusing well. She's had occasional flareups of her Crohn's disease but nothing overly serious    Suicidal Ideation: No Plan Formed: No Patient has means to carry out plan: No  Homicidal Ideation: No Plan Formed: No Patient has means to carry out plan: No  Review of Systems: Psychiatric: Agitation: No Hallucination: No Depressed Mood: No Insomnia: Yes Hypersomnia: No Altered Concentration: No Feels Worthless: No Grandiose Ideas: No Belief In Special Powers: No New/Increased Substance Abuse: No Compulsions: No  Neurologic: Headache: Yes Seizure: No Paresthesias: No  Social History: Patient lives with her husband and children.  She has one son and 2 daughter.  She has a high school education.  She likes reading listing music walking and fishing.  Medical history Patient has multiple medical issues.  She has asthma, Crohn's disease, acid reflux, headaches, anemia, sleep apnea and arthritis.  Her primary care physician is Dr. Moshe Cipro.  Her neurologist is Dr Wylie Hail and her gastroenterologist is Dr. Quincy Sheehan.   Alcohol and substance use history. Patient admitted using marijuana but claims to be sober since 2012. Outpatient Encounter Prescriptions as of 11/30/2015  Medication Sig Dispense Refill  . buPROPion (WELLBUTRIN XL) 150 MG 24 hr tablet Take 1 tablet (150 mg total) by mouth 2 (two) times daily. 60 tablet 3  . calcium-vitamin D (OSCAL WITH D) 500-200  MG-UNIT tablet Take 1 tablet by mouth daily.    . cetirizine (ZYRTEC) 10 MG tablet Take 10 mg by mouth daily as needed for allergies.     . chlorpheniramine (CHLOR-TRIMETON) 4 MG tablet Take 4 mg by mouth 2 (two) times daily as needed for allergies.    . Cyanocobalamin (B-12) 1000 MCG CAPS Take 1 capsule by mouth daily.      . diphenhydrAMINE (BENADRYL) 25 mg capsule Take 25 mg by mouth daily as needed. For allergies     . fluticasone (FLONASE) 50 MCG/ACT nasal spray SHAKE  LIQUID AND USE 2 SPRAYS IN EACH NOSTRIL EVERY DAY 16 g 1  . HUMIRA PEN 40 MG/0.8ML PNKT INJECT 1 PEN (40 MG) UNDER THE SKIN EVERY 14 DAYS 2 each 3  . iron polysaccharides (NIFEREX) 150 MG capsule Take 1 capsule (150 mg total) by mouth daily. 30 capsule 11  . Liniments (SALONPAS ARTHRITIS PAIN RELIEF) PADS Apply 1 each topically every 8 (eight) hours as needed. pain    . Multiple Vitamin (MULTIVITAMIN) tablet Take 1 tablet by mouth daily.    . Omega-3 Fatty Acids (FISH OIL) 1200 MG CAPS Take 1,200 mg by mouth 2 (two) times daily.    Marland Kitchen omeprazole (PRILOSEC) 20 MG capsule TAKE ONE (1) CAPSULE BY MOUTH TWICE A DAY BEFORE A MEAL 60 capsule 3  . potassium chloride SA (K-DUR,KLOR-CON) 20 MEQ tablet TAKE 1 TABLET(20 MEQ) BY MOUTH TWICE DAILY 60 tablet 1  . PROAIR HFA 108 (90 Base) MCG/ACT inhaler INHALE 2 PUFFS BY MOUTH EVERY 6 HOURS AS NEEDED FOR WHEEZING 8.5 g 0  . PROCTOZONE-HC 2.5 % rectal cream USE RECTALLY TWICE DAILY FOR 7 DAYS (Patient taking differently: USE RECTALLY TWICE DAILY FOR 7 DAYS AS NEEDED) 30 g 1  . traZODone (DESYREL) 100 MG tablet Take 1 tablet (100 mg total) by mouth at bedtime. 30 tablet 3  . [DISCONTINUED] buPROPion (WELLBUTRIN XL) 150 MG 24 hr tablet Take 1 tablet (150 mg total) by mouth 2 (two) times daily. 60 tablet 3  . [DISCONTINUED] traZODone (DESYREL) 100 MG tablet Take 1 tablet (100 mg total) by mouth at bedtime. 30 tablet 3  . [DISCONTINUED] Adalimumab (HUMIRA) 40 MG/0.8ML PSKT Inject 0.8 mLs (40 mg total) into the skin every 14 (fourteen) days. (Patient not taking: Reported on 11/30/2015) 2 each 3  . [DISCONTINUED] HYDROcodone-acetaminophen (NORCO/VICODIN) 5-325 MG tablet Take 1 tablet by mouth every 4 (four) hours as needed for moderate pain. (Patient not taking: Reported on 11/30/2015) 30 tablet 0   No facility-administered encounter medications on file as of 11/30/2015.     Past Psychiatric History/Hospitalization(s): Patient has multiple psychiatric hospitalizations in  the past.  Her last psychiatric hospitalization in 2005 but she also admitted hospitalization in 2008 which we do not have details.  She has history of taking overdose on her medication .  She has history of depression and she was also diagnosed with bipolar disorder.  In the past she has taken Seroquel, Risperdal, Celexa, Lexapro, and Xanax.  She has seen psychologist in the past.  Anxiety: No Bipolar Disorder: Yes Depression: Yes Mania: Yes Psychosis: No Schizophrenia: No Personality Disorder: No Hospitalization for psychiatric illness: Yes History of Electroconvulsive Shock Therapy: No Prior Suicide Attempts: No  Physical Exam: Constitutional:  BP 116/63 (BP Location: Right Arm, Patient Position: Sitting, Cuff Size: Large)   Pulse 81   Ht 5' 7"  (1.702 m)   Wt 202 lb (91.6 kg)   LMP 11/04/2015   BMI 31.64  kg/m   General Appearance: alert, oriented, no acute distress and well nourished  Musculoskeletal: Strength & Muscle Tone: within normal limits Gait & Station: normal Patient leans: N/A  Psychiatric: Speech (describe rate, volume, coherence, spontaneity, and abnormalities if any):  clear and coherent normal tone and volume.    Thought Process (describe rate, content, abstract reasoning, and computation):logical and goal-directed.    Associations: Coherent and Intact  Thoughts: normal  Mental Status: Orientation: oriented to person, place and time/date Mood & Affect: Mood is good and affect is bright Attention Span & Concentration:  fair   Medical Decision Making (Choose Three): Established Problem, Stable/Improving (1), Review of Psycho-Social Stressors (1), Review or order clinical lab tests (1), Review and summation of old records (2), Review of Last Therapy Session (1) and Review of Medication Regimen & Side Effects (2)  Assessment: Axis I: Maj. depressive disorder, recurrent  Axis II: deferred  Axis III:  Patient Active Problem List   Diagnosis Date Noted   . Need for prophylactic vaccination and inoculation against influenza 11/17/2015  . Crohn's disease of ileum with complication ([place]) 50/56/9794  . Annual physical exam 10/28/2014  . Fever blister 02/21/2014  . Iron deficiency anemia 08/28/2013  . Obesity (BMI 30.0-34.9) 04/04/2012  . Bipolar disorder ([place]) 04/03/2012  . Hx of nicotine dependence 11/14/2011  . Medicare annual wellness visit, subsequent 11/14/2011  . High risk for colon cancer 03/15/2011  . Leukocytosis 10/06/2010  . ECHOCARDIOGRAM, ABNORMAL 01/11/2008  . ANEMIA, B12 DEFICIENCY 12/29/2005  . ANXIETY 12/29/2005  . Depression, controlled 12/29/2005  . Allergic rhinitis 12/29/2005  . GERD 12/29/2005  . PEPTIC ULCER DISEASE 12/29/2005    Axis IV: mild to moderate  Axis V:  55-60   Plan:. Patient will continue Wellbutrin XL 150 mg twice a day for depression andtrazodone to 100 mg daily at bedtime to help with sleep.  I recommend to call us back if she is any question of  worsening of the symptom.  Otherwise I will see her in 4 months Time spent 15 minutes.  More than 50% of the time spent and psychoeducation, counseling and coordination of care.    Levonne Spiller, MD 11/30/2015

## 2015-12-01 ENCOUNTER — Encounter (HOSPITAL_COMMUNITY): Payer: Self-pay

## 2015-12-06 ENCOUNTER — Other Ambulatory Visit: Payer: Self-pay | Admitting: Gastroenterology

## 2015-12-12 ENCOUNTER — Other Ambulatory Visit: Payer: Self-pay | Admitting: Family Medicine

## 2016-01-03 ENCOUNTER — Encounter: Payer: Self-pay | Admitting: Family Medicine

## 2016-01-03 ENCOUNTER — Other Ambulatory Visit: Payer: Self-pay | Admitting: Family Medicine

## 2016-01-04 ENCOUNTER — Other Ambulatory Visit: Payer: Self-pay

## 2016-01-04 MED ORDER — ACYCLOVIR 400 MG PO TABS: 400.0000 mg | ORAL_TABLET | Freq: Three times a day (TID) | ORAL | 2 refills | Status: DC

## 2016-01-07 DIAGNOSIS — G4733 Obstructive sleep apnea (adult) (pediatric): Secondary | ICD-10-CM | POA: Diagnosis not present

## 2016-01-18 ENCOUNTER — Encounter: Payer: Self-pay | Admitting: Internal Medicine

## 2016-01-26 ENCOUNTER — Encounter (HOSPITAL_COMMUNITY): Payer: Self-pay

## 2016-02-05 ENCOUNTER — Encounter: Payer: Self-pay | Admitting: Internal Medicine

## 2016-02-05 ENCOUNTER — Ambulatory Visit (INDEPENDENT_AMBULATORY_CARE_PROVIDER_SITE_OTHER): Payer: BLUE CROSS/BLUE SHIELD | Admitting: Internal Medicine

## 2016-02-05 VITALS — BP 126/69 | HR 83 | Temp 97.8°F | Ht 67.5 in | Wt 209.4 lb

## 2016-02-05 DIAGNOSIS — K5090 Crohn's disease, unspecified, without complications: Secondary | ICD-10-CM
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
## 2016-02-05 NOTE — Patient Instructions (Signed)
Schedule surveillance TCS - 17 year history of ile-colonic Crohn's disease  Propofol  Split prep  Further recommendations to follow

## 2016-02-10 ENCOUNTER — Other Ambulatory Visit: Payer: Self-pay

## 2016-02-10 DIAGNOSIS — K50919 Crohn's disease, unspecified, with unspecified complications: Secondary | ICD-10-CM

## 2016-02-23 ENCOUNTER — Telehealth: Payer: Self-pay

## 2016-02-23 NOTE — Telephone Encounter (Signed)
Tried to call pt to inform of pre-op appt 03/02/16 at 9:00 am. No answer and unable to leave voicemail. Letter mailed.

## 2016-03-01 NOTE — Patient Instructions (Signed)
[name]  03/01/2016     @PREFPERIOPPHARMACY @   Your procedure is scheduled on  03/10/2016   Report to [facility] at  645  A.M.  Call this number if you have problems the morning of surgery:  [phone]   Remember:  Do not eat food or drink liquids after midnight.  Take these medicines the morning of surgery with A SIP OF WATER  Wellbutrin, zyrtec, prilosec. Take your inhaler before you come and bring your rescue inhaler with you.   Do not wear jewelry, make-up or nail polish.  Do not wear lotions, powders, or perfumes, or deoderant.  Do not shave 48 hours prior to surgery.  Men may shave face and neck.  Do not bring valuables to the hospital.  [facility] is not responsible for any belongings or valuables.  Contacts, dentures or bridgework may not be worn into surgery.  Leave your suitcase in the car.  After surgery it may be brought to your room.  For patients admitted to the hospital, discharge time will be determined by your treatment team.  Patients discharged the day of surgery will not be allowed to drive home.   Name and phone number of your driver:   family Special instructions:  Follow the diet and prep instructions given to you by Dr Roseanne Kaufman office.  Please read over the following fact sheets that you were given. Anesthesia Post-op Instructions and Care and Recovery After Surgery       Colonoscopy, Adult A colonoscopy is an exam to look at the large intestine. It is done to check for problems, such as:  Lumps (tumors).  Growths (polyps).  Swelling (inflammation).  Bleeding. What happens before the procedure? Eating and drinking Follow instructions from your doctor about eating and drinking. These instructions may include:  A few days before the procedure - follow a low-fiber diet.  Avoid nuts.  Avoid seeds.  Avoid dried fruit.  Avoid raw fruits.  Avoid vegetables.  1-3 days before the procedure - follow a clear liquid  diet. Avoid liquids that have red or purple dye. Drink only clear liquids, such as:  Clear broth or bouillon.  Black coffee or tea.  Clear juice.  Clear soft drinks or sports drinks.  [place] desert.  Popsicles.  On the day of the procedure - do not eat or drink anything during the 2 hours before the procedure. Bowel prep If you were prescribed an oral bowel prep:  Take it as told by your doctor. Starting the day before your procedure, you will need to drink a lot of liquid. The liquid will cause you to poop (have bowel movements) until your poop is almost clear or light green.  If your skin or butt gets irritated from diarrhea, you may:  Wipe the area with wipes that have medicine in them, such as adult wet wipes with aloe and vitamin E.  Put something on your skin that soothes the area, such as petroleum jelly.  If you throw up (vomit) while drinking the bowel prep, take a break for up to 60 minutes. Then begin the bowel prep again. If you keep throwing up and you cannot take the bowel prep without throwing up, call your doctor. General instructions  Ask your doctor about changing or stopping your normal medicines. This is important if you take diabetes medicines or blood thinners.  Plan to have someone take you home from the hospital or clinic.  What happens during the procedure?  An IV tube may be put into one of your veins.  You will be given medicine to help you relax (sedative).  To reduce your risk of infection:  Your doctors will wash their hands.  Your anal area will be washed with soap.  You will be asked to lie on your side with your knees bent.  Your doctor will get a long, thin, flexible tube ready. The tube will have a camera and a light on the end.  The tube will be put into your anus.  The tube will be gently put into your large intestine.  Air will be delivered into your large intestine to keep it open. You may feel some pressure or  cramping.  The camera will be used to take photos.  A small tissue sample may be removed from your body to be looked at under a microscope (biopsy). If any possible problems are found, the tissue will be sent to a lab for testing.  If small growths are found, your doctor may remove them and have them checked for cancer.  The tube that was put into your anus will be slowly removed. The procedure may vary among doctors and hospitals. What happens after the procedure?  Your doctor will check on you often until the medicines you were given have worn off.  Do not drive for 24 hours after the procedure.  You may have a small amount of blood in your poop.  You may pass gas.  You may have mild cramps or bloating in your belly (abdomen).  It is up to you to get the results of your procedure. Ask your doctor, or the department performing the procedure, when your results will be ready. This information is not intended to replace advice given to you by your health care provider. Make sure you discuss any questions you have with your health care provider. Document Released: 03/26/2010 Document Revised: 10/29/2015 Document Reviewed: 05/05/2015 Elsevier Interactive Patient Education  2017 [employer]  Colonoscopy, Adult, Care After This sheet gives you information about how to care for yourself after your procedure. Your doctor may also give you more specific instructions. If you have problems or questions, call your doctor. Follow these instructions at home: General instructions  For the first 24 hours after the procedure:  Do not drive or use machinery.  Do not sign important documents.  Do not drink alcohol.  Do your daily activities more slowly than normal.  Eat foods that are soft and easy to digest.  Rest often.  Take over-the-counter or prescription medicines only as told by your doctor.  It is up to you to get the results of your procedure. Ask your doctor, or the  department performing the procedure, when your results will be ready. To help cramping and bloating:  Try walking around.  Put heat on your belly (abdomen) as told by your doctor. Use a heat source that your doctor recommends, such as a moist heat pack or a heating pad.  Put a towel between your skin and the heat source.  Leave the heat on for 20-30 minutes.  Remove the heat if your skin turns bright red. This is especially important if you cannot feel pain, heat, or cold. You can get burned. Eating and drinking  Drink enough fluid to keep your pee (urine) clear or pale yellow.  Return to your normal diet as told by your doctor. Avoid heavy or fried foods that are hard to  digest.  Avoid drinking alcohol for as long as told by your doctor. Contact a doctor if:  You have blood in your poop (stool) 2-3 days after the procedure. Get help right away if:  You have more than a small amount of blood in your poop.  You see large clumps of tissue (blood clots) in your poop.  Your belly is swollen.  You feel sick to your stomach (nauseous).  You throw up (vomit).  You have a fever.  You have belly pain that gets worse, and medicine does not help your pain. This information is not intended to replace advice given to you by your health care provider. Make sure you discuss any questions you have with your health care provider. Document Released: 03/26/2010 Document Revised: 11/16/2015 Document Reviewed: 11/16/2015 Elsevier Interactive Patient Education  2017 [place] Anesthesia is a term that refers to techniques, procedures, and medicines that help a person stay safe and comfortable during a medical procedure. Monitored anesthesia care, or sedation, is one type of anesthesia. Your anesthesia specialist may recommend sedation if you will be having a procedure that does not require you to be unconscious, such as:  Cataract surgery.  A dental  procedure.  A biopsy.  A colonoscopy. During the procedure, you may receive a medicine to help you relax (sedative). There are three levels of sedation:  Mild sedation. At this level, you may feel awake and relaxed. You will be able to follow directions.  Moderate sedation. At this level, you will be sleepy. You may not remember the procedure.  Deep sedation. At this level, you will be asleep. You will not remember the procedure. The more medicine you are given, the deeper your level of sedation will be. Depending on how you respond to the procedure, the anesthesia specialist may change your level of sedation or the type of anesthesia to fit your needs. An anesthesia specialist will monitor you closely during the procedure. Let your health care provider know about:  Any allergies you have.  All medicines you are taking, including vitamins, herbs, eye drops, creams, and over-the-counter medicines.  Any use of steroids (by mouth or as a cream).  Any problems you or family members have had with sedatives and anesthetic medicines.  Any blood disorders you have.  Any surgeries you have had.  Any medical conditions you have, such as sleep apnea.  Whether you are pregnant or may be pregnant.  Any use of cigarettes, alcohol, or street drugs. What are the risks? Generally, this is a safe procedure. However, problems may occur, including:  Getting too much medicine (oversedation).  Nausea.  Allergic reaction to medicines.  Trouble breathing. If this happens, a breathing tube may be used to help with breathing. It will be removed when you are awake and breathing on your own.  Heart trouble.  Lung trouble. Before the procedure Staying hydrated  Follow instructions from your health care provider about hydration, which may include:  Up to 2 hours before the procedure - you may continue to drink clear liquids, such as water, clear fruit juice, black coffee, and plain tea. Eating  and drinking restrictions  Follow instructions from your health care provider about eating and drinking, which may include:  8 hours before the procedure - stop eating heavy meals or foods such as meat, fried foods, or fatty foods.  6 hours before the procedure - stop eating light meals or foods, such as toast or cereal.  6 hours  before the procedure - stop drinking milk or drinks that contain milk.  2 hours before the procedure - stop drinking clear liquids. Medicines  Ask your health care provider about:  Changing or stopping your regular medicines. This is especially important if you are taking diabetes medicines or blood thinners.  Taking medicines such as aspirin and ibuprofen. These medicines can thin your blood. Do not take these medicines before your procedure if your health care provider instructs you not to. Tests and exams  You will have a physical exam.  You may have blood tests done to show:  How well your kidneys and liver are working.  How well your blood can clot.  General instructions  Plan to have someone take you home from the hospital or clinic.  If you will be going home right after the procedure, plan to have someone with you for 24 hours. What happens during the procedure?  Your blood pressure, heart rate, breathing, level of pain and overall condition will be monitored.  An IV tube will be inserted into one of your veins.  Your anesthesia specialist will give you medicines as needed to keep you comfortable during the procedure. This may mean changing the level of sedation.  The procedure will be performed. After the procedure  Your blood pressure, heart rate, breathing rate, and blood oxygen level will be monitored until the medicines you were given have worn off.  Do not drive for 24 hours if you received a sedative.  You may:  Feel sleepy, clumsy, or nauseous.  Feel forgetful about what happened after the procedure.  Have a sore throat if  you had a breathing tube during the procedure.  Vomit. This information is not intended to replace advice given to you by your health care provider. Make sure you discuss any questions you have with your health care provider. Document Released: 11/17/2004 Document Revised: 07/31/2015 Document Reviewed: 06/14/2015 Elsevier Interactive Patient Education  2017 [place], Care After These instructions provide you with information about caring for yourself after your procedure. Your health care provider may also give you more specific instructions. Your treatment has been planned according to current medical practices, but problems sometimes occur. Call your health care provider if you have any problems or questions after your procedure. What can I expect after the procedure? After your procedure, it is common to:  Feel sleepy for several hours.  Feel clumsy and have poor balance for several hours.  Feel forgetful about what happened after the procedure.  Have poor judgment for several hours.  Feel nauseous or vomit.  Have a sore throat if you had a breathing tube during the procedure. Follow these instructions at home: For at least 24 hours after the procedure:   Do not:  Participate in activities in which you could fall or become injured.  Drive.  Use heavy machinery.  Drink alcohol.  Take sleeping pills or medicines that cause drowsiness.  Make important decisions or sign legal documents.  Take care of children on your own.  Rest. Eating and drinking  Follow the diet that is recommended by your health care provider.  If you vomit, drink water, juice, or soup when you can drink without vomiting.  Make sure you have little or no nausea before eating solid foods. General instructions  Have a responsible adult stay with you until you are awake and alert.  Take over-the-counter and prescription medicines only as told by your health care  provider.  If you smoke, do not smoke without supervision.  Keep all follow-up visits as told by your health care provider. This is important. Contact a health care provider if:  You keep feeling nauseous or you keep vomiting.  You feel light-headed.  You develop a rash.  You have a fever. Get help right away if:  You have trouble breathing. This information is not intended to replace advice given to you by your health care provider. Make sure you discuss any questions you have with your health care provider. Document Released: 06/14/2015 Document Revised: 10/14/2015 Document Reviewed: 06/14/2015 Elsevier Interactive Patient Education  2017 [employer].

## 2016-03-02 ENCOUNTER — Encounter: Payer: Self-pay | Admitting: Nurse Practitioner

## 2016-03-02 ENCOUNTER — Other Ambulatory Visit: Payer: Self-pay

## 2016-03-02 ENCOUNTER — Ambulatory Visit (INDEPENDENT_AMBULATORY_CARE_PROVIDER_SITE_OTHER): Payer: Self-pay | Admitting: Nurse Practitioner

## 2016-03-02 ENCOUNTER — Encounter (HOSPITAL_COMMUNITY): Payer: Self-pay

## 2016-03-02 ENCOUNTER — Encounter (HOSPITAL_COMMUNITY)
Admission: RE | Admit: 2016-03-02 | Discharge: 2016-03-02 | Disposition: A | Discharge status: Home / Self Care | Payer: BLUE CROSS/BLUE SHIELD | Source: Ambulatory Visit | Attending: Internal Medicine | Admitting: Internal Medicine

## 2016-03-02 VITALS — BP 126/79 | HR 80 | Temp 97.8°F | Ht 67.5 in | Wt 211.6 lb

## 2016-03-02 DIAGNOSIS — K5090 Crohn's disease, unspecified, without complications: Secondary | ICD-10-CM | POA: Insufficient documentation

## 2016-03-02 DIAGNOSIS — K219 Gastro-esophageal reflux disease without esophagitis: Secondary | ICD-10-CM | POA: Insufficient documentation

## 2016-03-02 DIAGNOSIS — Z01812 Encounter for preprocedural laboratory examination: Secondary | ICD-10-CM | POA: Diagnosis not present

## 2016-03-02 DIAGNOSIS — K50019 Crohn's disease of small intestine with unspecified complications: Secondary | ICD-10-CM

## 2016-03-02 HISTORY — DX: Sleep apnea, unspecified: G47.30

## 2016-03-02 LAB — BASIC METABOLIC PANEL
ANION GAP: 5 (ref 5–15)
BUN: 6 mg/dL (ref 6–20)
CALCIUM: 8.4 mg/dL — AB (ref 8.9–10.3)
CHLORIDE: 106 mmol/L (ref 101–111)
CO2: 24 mmol/L (ref 22–32)
Creatinine, Ser: 0.68 mg/dL (ref 0.44–1.00)
GFR calc non Af Amer: >60 mL/min (ref >60)
Glucose, Bld: 94 mg/dL (ref 65–99)
Potassium: 4.2 mmol/L (ref 3.5–5.1)
SODIUM: 135 mmol/L (ref 135–145)

## 2016-03-02 LAB — CBC WITH DIFFERENTIAL/PLATELET
BASOS PCT: 0 %
Basophils Absolute: 0 10*3/uL (ref 0.0–0.1)
Eosinophils Absolute: 0.4 10*3/uL (ref 0.0–0.7)
Eosinophils Relative: 4 %
HEMATOCRIT: 34.2 % — AB (ref 36.0–46.0)
HEMOGLOBIN: 11.4 g/dL — AB (ref 12.0–15.0)
Lymphocytes Relative: 49 %
Lymphs Abs: 5.1 10*3/uL — ABNORMAL HIGH (ref 0.7–4.0)
MCH: 28.9 pg (ref 26.0–34.0)
MCHC: 33.3 g/dL (ref 30.0–36.0)
MCV: 86.8 fL (ref 78.0–100.0)
Monocytes Absolute: 0.6 10*3/uL (ref 0.1–1.0)
Monocytes Relative: 5 %
NEUTROS ABS: 4.3 10*3/uL (ref 1.7–7.7)
NEUTROS PCT: 42 %
Platelets: 341 10*3/uL (ref 150–400)
RBC: 3.94 MIL/uL (ref 3.87–5.11)
RDW: 15.7 % — AB (ref 11.5–15.5)
WBC: 10.4 10*3/uL (ref 4.0–10.5)

## 2016-03-02 LAB — HCG, SERUM, QUALITATIVE: PREG SERUM: NEGATIVE

## 2016-03-02 MED ORDER — PEG 3350-KCL-NA BICARB-NACL 420 G PO SOLR: 4000.0000 mL | ORAL | 0 refills | Status: DC
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
## 2016-03-02 NOTE — Assessment & Plan Note (Signed)
No changes since her last office visit. Still doing relatively well. On Humira, no abdominal pain, diarrhea, rectal bleeding. Energy is intermittently up-and-down, no fever or chills, unintentional weight loss. We will proceed with colonoscopy as previously scheduled.

## 2016-03-02 NOTE — Progress Notes (Signed)
cc'd to pcp 

## 2016-03-02 NOTE — Patient Instructions (Signed)
1. We will finalize scheduling of your procedure. 2. Return for follow-up based on postprocedure recommendations.

## 2016-03-09 ENCOUNTER — Encounter: Payer: Self-pay | Admitting: Gastroenterology

## 2016-03-10 ENCOUNTER — Encounter (HOSPITAL_COMMUNITY)
Admission: RE | Disposition: A | Discharge status: Home / Self Care | Payer: Self-pay | Source: Ambulatory Visit | Attending: Internal Medicine

## 2016-03-10 ENCOUNTER — Ambulatory Visit (HOSPITAL_COMMUNITY): Payer: BLUE CROSS/BLUE SHIELD | Admitting: Anesthesiology

## 2016-03-10 ENCOUNTER — Ambulatory Visit (HOSPITAL_COMMUNITY)
Admission: RE | Admit: 2016-03-10 | Discharge: 2016-03-10 | Disposition: A | Discharge status: Home / Self Care | Payer: BLUE CROSS/BLUE SHIELD | Source: Ambulatory Visit | Attending: Internal Medicine | Admitting: Internal Medicine

## 2016-03-10 ENCOUNTER — Encounter (HOSPITAL_COMMUNITY): Payer: Self-pay | Admitting: *Deleted

## 2016-03-10 DIAGNOSIS — F319 Bipolar disorder, unspecified: Secondary | ICD-10-CM | POA: Diagnosis not present

## 2016-03-10 DIAGNOSIS — Z91013 Allergy to seafood: Secondary | ICD-10-CM | POA: Diagnosis not present

## 2016-03-10 DIAGNOSIS — D519 Vitamin B12 deficiency anemia, unspecified: Secondary | ICD-10-CM | POA: Insufficient documentation

## 2016-03-10 DIAGNOSIS — E876 Hypokalemia: Secondary | ICD-10-CM | POA: Insufficient documentation

## 2016-03-10 DIAGNOSIS — Z87891 Personal history of nicotine dependence: Secondary | ICD-10-CM | POA: Diagnosis not present

## 2016-03-10 DIAGNOSIS — J45909 Unspecified asthma, uncomplicated: Secondary | ICD-10-CM | POA: Insufficient documentation

## 2016-03-10 DIAGNOSIS — Z820 Family history of epilepsy and other diseases of the nervous system: Secondary | ICD-10-CM | POA: Insufficient documentation

## 2016-03-10 DIAGNOSIS — Z1211 Encounter for screening for malignant neoplasm of colon: Secondary | ICD-10-CM | POA: Diagnosis not present

## 2016-03-10 DIAGNOSIS — J309 Allergic rhinitis, unspecified: Secondary | ICD-10-CM | POA: Diagnosis not present

## 2016-03-10 DIAGNOSIS — Z1212 Encounter for screening for malignant neoplasm of rectum: Secondary | ICD-10-CM | POA: Diagnosis not present

## 2016-03-10 DIAGNOSIS — D649 Anemia, unspecified: Secondary | ICD-10-CM | POA: Insufficient documentation

## 2016-03-10 DIAGNOSIS — Z79899 Other long term (current) drug therapy: Secondary | ICD-10-CM | POA: Insufficient documentation

## 2016-03-10 DIAGNOSIS — Z9049 Acquired absence of other specified parts of digestive tract: Secondary | ICD-10-CM | POA: Diagnosis not present

## 2016-03-10 DIAGNOSIS — K50919 Crohn's disease, unspecified, with unspecified complications: Secondary | ICD-10-CM

## 2016-03-10 DIAGNOSIS — K5000 Crohn's disease of small intestine without complications: Secondary | ICD-10-CM | POA: Insufficient documentation

## 2016-03-10 DIAGNOSIS — Z825 Family history of asthma and other chronic lower respiratory diseases: Secondary | ICD-10-CM | POA: Insufficient documentation

## 2016-03-10 DIAGNOSIS — G4730 Sleep apnea, unspecified: Secondary | ICD-10-CM | POA: Insufficient documentation

## 2016-03-10 DIAGNOSIS — K219 Gastro-esophageal reflux disease without esophagitis: Secondary | ICD-10-CM | POA: Insufficient documentation

## 2016-03-10 DIAGNOSIS — F419 Anxiety disorder, unspecified: Secondary | ICD-10-CM | POA: Insufficient documentation

## 2016-03-10 DIAGNOSIS — Z91010 Allergy to peanuts: Secondary | ICD-10-CM | POA: Insufficient documentation

## 2016-03-10 DIAGNOSIS — M1990 Unspecified osteoarthritis, unspecified site: Secondary | ICD-10-CM | POA: Diagnosis not present

## 2016-03-10 DIAGNOSIS — Z888 Allergy status to other drugs, medicaments and biological substances status: Secondary | ICD-10-CM | POA: Diagnosis not present

## 2016-03-10 DIAGNOSIS — M545 Low back pain: Secondary | ICD-10-CM | POA: Diagnosis not present

## 2016-03-10 DIAGNOSIS — Z818 Family history of other mental and behavioral disorders: Secondary | ICD-10-CM | POA: Diagnosis not present

## 2016-03-10 DIAGNOSIS — Z8489 Family history of other specified conditions: Secondary | ICD-10-CM | POA: Insufficient documentation

## 2016-03-10 DIAGNOSIS — Z885 Allergy status to narcotic agent status: Secondary | ICD-10-CM | POA: Diagnosis not present

## 2016-03-10 DIAGNOSIS — Z8711 Personal history of peptic ulcer disease: Secondary | ICD-10-CM | POA: Insufficient documentation

## 2016-03-10 HISTORY — PX: COLONOSCOPY WITH PROPOFOL: SHX5780

## 2016-03-10 SURGERY — COLONOSCOPY WITH PROPOFOL
Anesthesia: Monitor Anesthesia Care

## 2016-03-10 MED ORDER — LIDOCAINE HCL (CARDIAC) 10 MG/ML IV SOLN
INTRAVENOUS | Status: DC | PRN
  Administered 2016-03-10: 50 mg via INTRAVENOUS

## 2016-03-10 MED ORDER — PROPOFOL 500 MG/50ML IV EMUL
INTRAVENOUS | Status: DC | PRN
  Administered 2016-03-10: 100 ug/kg/min via INTRAVENOUS

## 2016-03-10 MED ORDER — PROPOFOL 10 MG/ML IV BOLUS
INTRAVENOUS | Status: AC
  Filled 2016-03-10: qty 40

## 2016-03-10 MED ORDER — PROPOFOL 10 MG/ML IV BOLUS
INTRAVENOUS | Status: DC | PRN
  Administered 2016-03-10 (×3): 20 mg via INTRAVENOUS

## 2016-03-10 MED ORDER — FENTANYL CITRATE (PF) 100 MCG/2ML IJ SOLN
INTRAMUSCULAR | Status: AC
  Filled 2016-03-10: qty 2

## 2016-03-10 MED ORDER — LACTATED RINGERS IV SOLN
INTRAVENOUS | Status: DC
  Administered 2016-03-10: 08:00:00 via INTRAVENOUS

## 2016-03-10 MED ORDER — CHLORHEXIDINE GLUCONATE CLOTH 2 % EX PADS: 6.0000 | MEDICATED_PAD | Freq: Once | CUTANEOUS | Status: DC

## 2016-03-10 MED ORDER — LIDOCAINE HCL (PF) 1 % IJ SOLN
INTRAMUSCULAR | Status: AC
Start: 2016-03-10 — End: 2016-03-10
  Filled 2016-03-10: qty 5

## 2016-03-10 MED ORDER — MIDAZOLAM HCL 2 MG/2ML IJ SOLN
1.0000 mg | INTRAMUSCULAR | Status: DC | PRN
  Administered 2016-03-10 (×2): 2 mg via INTRAVENOUS
  Filled 2016-03-10: qty 2

## 2016-03-10 MED ORDER — MIDAZOLAM HCL 2 MG/2ML IJ SOLN
INTRAMUSCULAR | Status: AC
  Filled 2016-03-10: qty 2

## 2016-03-10 MED ORDER — FENTANYL CITRATE (PF) 100 MCG/2ML IJ SOLN
25.0000 ug | INTRAMUSCULAR | Status: AC | PRN
  Administered 2016-03-10 (×2): 25 ug via INTRAVENOUS

## 2016-03-10 NOTE — Anesthesia Postprocedure Evaluation (Signed)
Anesthesia Post Note  Patient: [name]  Procedure(s) Performed: Procedure(s) (LRB): COLONOSCOPY WITH PROPOFOL (N/A)  Patient location during evaluation: PACU Anesthesia Type: MAC Level of consciousness: awake Pain management: pain level controlled Vital Signs Assessment: post-procedure vital signs reviewed and stable Cardiovascular status: blood pressure returned to baseline and stable Anesthetic complications: no     Last Vitals:  Vitals:   03/10/16 0805 03/10/16 0810  BP: 117/70 113/68  Pulse:    Resp: 20 17  Temp:      Last Pain:  Vitals:   03/10/16 0720  TempSrc: Oral                 [name]

## 2016-03-10 NOTE — Anesthesia Preprocedure Evaluation (Signed)
Anesthesia Evaluation  Patient identified by MRN, date of birth, ID band Patient awake    Reviewed: Allergy & Precautions, NPO status , Patient's Chart, lab work & pertinent test results  Airway Mallampati: II  TM Distance: >3 FB Neck ROM: Full    Dental  (+) Teeth Intact   Pulmonary asthma , sleep apnea , former smoker,    breath sounds clear to auscultation       Cardiovascular negative cardio ROS   Rhythm:Regular Rate:Normal     Neuro/Psych PSYCHIATRIC DISORDERS Anxiety Depression Bipolar Disorder    GI/Hepatic PUD, GERD  ,  Endo/Other    Renal/GU      Musculoskeletal  (+) Arthritis  (chronic LBP),   Abdominal   Peds  Hematology  (+) anemia ,   Anesthesia Other Findings   Reproductive/Obstetrics                             Anesthesia Physical Anesthesia Plan  ASA: III  Anesthesia Plan: MAC   Post-op Pain Management:    Induction: Intravenous  Airway Management Planned: Simple Face Mask  Additional Equipment:   Intra-op Plan:   Post-operative Plan:   Informed Consent: I have reviewed the patients History and Physical, chart, labs and discussed the procedure including the risks, benefits and alternatives for the proposed anesthesia with the patient or authorized representative who has indicated his/her understanding and acceptance.     Plan Discussed with:   Anesthesia Plan Comments:         Anesthesia Quick Evaluation

## 2016-03-10 NOTE — Transfer of Care (Signed)
Immediate Anesthesia Transfer of Care Note  Patient: [name]  Procedure(s) Performed: Procedure(s) with comments: COLONOSCOPY WITH PROPOFOL (N/A) - 815  Patient Location: PACU  Anesthesia Type:MAC  Level of Consciousness: awake and patient cooperative  Airway & Oxygen Therapy: Patient Spontanous Breathing and non-rebreather face mask  Post-op Assessment: Report given to RN and Post -op Vital signs reviewed and stable  Post vital signs: Reviewed  Last Vitals:  Vitals:   03/10/16 0805 03/10/16 0810  BP: 117/70 113/68  Pulse:    Resp: 20 17  Temp:      Last Pain:  Vitals:   03/10/16 0720  TempSrc: Oral      Patients Stated Pain Goal: 7 (48/88/91 6945)  Complications: No apparent anesthesia complications

## 2016-03-10 NOTE — Discharge Instructions (Addendum)
Office visit in 6 months. They will call you.  Repeat colonoscopy in one year  Continue present medications. Colonoscopy Discharge Instructions  Read the instructions outlined below and refer to this sheet in the next few weeks. These discharge instructions provide you with general information on caring for yourself after you leave the hospital. Your doctor may also give you specific instructions. While your treatment has been planned according to the most current medical practices available, unavoidable complications occasionally occur. If you have any problems or questions after discharge, call Dr. Gala Romney at [phone]. ACTIVITY  You may resume your regular activity, but move at a slower pace for the next 24 hours.   Take frequent rest periods for the next 24 hours.   Walking will help get rid of the air and reduce the bloated feeling in your belly (abdomen).   No driving for 24 hours (because of the medicine (anesthesia) used during the test).    Do not sign any important legal documents or operate any machinery for 24 hours (because of the anesthesia used during the test).  NUTRITION  Drink plenty of fluids.   You may resume your normal diet as instructed by your doctor.   Begin with a light meal and progress to your normal diet. Heavy or fried foods are harder to digest and may make you feel sick to your stomach (nauseated).   Avoid alcoholic beverages for 24 hours or as instructed.  MEDICATIONS  You may resume your normal medications unless your doctor tells you otherwise.  WHAT YOU CAN EXPECT TODAY  Some feelings of bloating in the abdomen.   Passage of more gas than usual.   Spotting of blood in your stool or on the toilet paper.  IF YOU HAD POLYPS REMOVED DURING THE COLONOSCOPY:  No aspirin products for 7 days or as instructed.   No alcohol for 7 days or as instructed.   Eat a soft diet for the next 24 hours.  FINDING OUT THE RESULTS OF YOUR TEST Not all test  results are available during your visit. If your test results are not back during the visit, make an appointment with your caregiver to find out the results. Do not assume everything is normal if you have not heard from your caregiver or the medical facility. It is important for you to follow up on all of your test results.  SEEK IMMEDIATE MEDICAL ATTENTION IF:  You have more than a spotting of blood in your stool.   Your belly is swollen (abdominal distention).   You are nauseated or vomiting.   You have a temperature over 101.   You have abdominal pain or discomfort that is severe or gets worse throughout the day.    PATIENT INSTRUCTIONS POST-ANESTHESIA  IMMEDIATELY FOLLOWING SURGERY:  Do not drive or operate machinery for the first twenty four hours after surgery.  Do not make any important decisions for twenty four hours after surgery or while taking narcotic pain medications or sedatives.  If you develop intractable nausea and vomiting or a severe headache please notify your doctor immediately.  FOLLOW-UP:  Please make an appointment with your surgeon as instructed. You do not need to follow up with anesthesia unless specifically instructed to do so.  WOUND CARE INSTRUCTIONS (if applicable):  Keep a dry clean dressing on the anesthesia/puncture wound site if there is drainage.  Once the wound has quit draining you may leave it open to air.  Generally you should leave the bandage intact for  twenty four hours unless there is drainage.  If the epidural site drains for more than 36-48 hours please call the anesthesia department.  QUESTIONS?:  Please feel free to call your physician or the hospital operator if you have any questions, and they will be happy to assist you.

## 2016-03-10 NOTE — Anesthesia Procedure Notes (Signed)
Procedure Name: MAC Date/Time: 03/10/2016 8:13 AM Performed by: Vista Deck Pre-anesthesia Checklist: Patient identified, Emergency Drugs available, Suction available, Timeout performed and Patient being monitored Patient Re-evaluated:Patient Re-evaluated prior to inductionOxygen Delivery Method: Non-rebreather mask

## 2016-03-10 NOTE — Op Note (Signed)
[facility] Patient Name: [name] Procedure Date: 03/10/2016 7:47 AM MRN: [id number] Date of Birth: 08/31/1970 Attending MD: Norvel Richards , MD CSN: [id number] Age: 46 Admit Type: Outpatient Procedure:                Colonoscopy Indications:              High risk colon cancer surveillance: Crohn's                            disease small intestine Providers:                Norvel Richards, MD, Lurline Del, RN, Purcell Nails.                            [place], Technician, Aram Candela Referring MD:              Medicines:                Propofol per Anesthesia Complications:            No immediate complications. Estimated Blood Loss:     Estimated blood loss: none. Procedure:                Pre-Anesthesia Assessment:                           - Prior to the procedure, a History and Physical                            was performed, and patient medications and                            allergies were reviewed. The patient's tolerance of                            previous anesthesia was also reviewed. The risks                            and benefits of the procedure and the sedation                            options and risks were discussed with the patient.                            All questions were answered, and informed consent                            was obtained. Prior Anticoagulants: The patient has                            taken no previous anticoagulant or antiplatelet                            agents. ASA Grade Assessment: II - A patient with  mild systemic disease. After reviewing the risks                            and benefits, the patient was deemed in                            satisfactory condition to undergo the procedure.                           After obtaining informed consent, the colonoscope                            was passed under direct vision. Throughout the                            procedure, the  patient's blood pressure, pulse, and                            oxygen saturations were monitored continuously. The                            Colonoscope was introduced through the and advanced                            to the the ileocolonic anastomosis. The rectum was                            photographed. The quality of the bowel preparation                            was inadequate. Scope In: 8:23:10 AM Scope Out: 8:34:08 AM Scope Withdrawal Time: 0 hours 5 minutes 12 seconds  Total Procedure Duration: 0 hours 10 minutes 58 seconds  Findings:      The perianal and digital rectal examinations were normal. Preparation       inadequate. Landmarks not seen. Surgical changes right colon.      The exam was otherwise without abnormality on direct and retroflexion       views. Impression:               - Inadequate preparation precluded complete                            examination..                           - No specimens collected. Moderate Sedation:      Moderate (conscious) sedation was personally administered by an       anesthesia professional. The following parameters were monitored: oxygen       saturation, heart rate, blood pressure, respiratory rate, EKG, adequacy       of pulmonary ventilation, and response to care. Total physician       intraservice time was 18 minutes. Recommendation:           - Patient has a contact number available for  emergencies. The signs and symptoms of potential                            delayed complications were discussed with the                            patient. Return to normal activities tomorrow.                            Written discharge instructions were provided to the                            patient.                           - Resume previous diet.                           - Continue present medications.                           - Repeat colonoscopy in 1 year for screening                             purposes.                           - Return to GI office in 6 months. Procedure Code(s):        --- Professional ---                           [phone], Colonoscopy, flexible; diagnostic, including                            collection of specimen(s) by brushing or washing,                            when performed (separate procedure) Diagnosis Code(s):        --- Professional ---                           K50.00, Crohn's disease of small intestine without                            complications CPT copyright 2016 [employer]. All rights reserved. The codes documented in this report are preliminary and upon coder review may  be revised to meet current compliance requirements. Cristopher Estimable. Rourk, MD Norvel Richards, MD 03/10/2016 8:47:12 AM This report has been signed electronically. Number of Addenda: 0
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
## 2016-03-10 NOTE — Interval H&P Note (Signed)
History and Physical Interval Note:  03/10/2016 7:58 AM  [name]  has presented today for surgery, with the diagnosis of HISTORY OF ILE-COLONIC CROHN DISEASE  The various methods of treatment have been discussed with the patient and family. After consideration of risks, benefits and other options for treatment, the patient has consented to  Procedure(s) with comments: COLONOSCOPY WITH PROPOFOL (N/A) - 815 as a surgical intervention .  The patient's history has been reviewed, patient examined, no change in status, stable for surgery.  I have reviewed the patient's chart and labs.  Questions were answered to the patient's satisfaction.     [name]  No change. Colonoscopy per plan.  The risks, benefits, limitations, alternatives and imponderables have been reviewed with the patient. Questions have been answered. All parties are agreeable.

## 2016-03-14 ENCOUNTER — Encounter (HOSPITAL_COMMUNITY): Payer: Self-pay | Admitting: Internal Medicine

## 2016-03-22 ENCOUNTER — Encounter (HOSPITAL_COMMUNITY): Payer: Self-pay

## 2016-03-29 ENCOUNTER — Other Ambulatory Visit: Payer: Self-pay | Admitting: Nurse Practitioner

## 2016-03-31 ENCOUNTER — Ambulatory Visit (HOSPITAL_COMMUNITY): Payer: Self-pay | Admitting: Psychiatry

## 2016-04-05 ENCOUNTER — Encounter: Payer: Self-pay | Admitting: Family Medicine

## 2016-04-05 NOTE — Telephone Encounter (Signed)
Spoke with patient.   Area is cyst that she noticed 24 hours ago under right axilla.  No drainage noted at this time.  Sore to touch.    Patient scheduled with Dr. Meda Coffee for 2/2

## 2016-04-06 ENCOUNTER — Encounter (HOSPITAL_COMMUNITY): Payer: BLUE CROSS/BLUE SHIELD | Attending: Hematology & Oncology

## 2016-04-06 DIAGNOSIS — D72829 Elevated white blood cell count, unspecified: Secondary | ICD-10-CM | POA: Insufficient documentation

## 2016-04-06 DIAGNOSIS — D509 Iron deficiency anemia, unspecified: Secondary | ICD-10-CM | POA: Diagnosis not present

## 2016-04-06 LAB — CBC WITH DIFFERENTIAL/PLATELET
BASOS PCT: 0 %
Basophils Absolute: 0 10*3/uL (ref 0.0–0.1)
EOS ABS: 0.4 10*3/uL (ref 0.0–0.7)
Eosinophils Relative: 3 %
HEMATOCRIT: 33.5 % — AB (ref 36.0–46.0)
HEMOGLOBIN: 11.4 g/dL — AB (ref 12.0–15.0)
LYMPHS ABS: 5.1 10*3/uL — AB (ref 0.7–4.0)
Lymphocytes Relative: 43 %
MCH: 28.9 pg (ref 26.0–34.0)
MCHC: 34 g/dL (ref 30.0–36.0)
MCV: 85 fL (ref 78.0–100.0)
Monocytes Absolute: 0.6 10*3/uL (ref 0.1–1.0)
Monocytes Relative: 5 %
NEUTROS ABS: 5.9 10*3/uL (ref 1.7–7.7)
NEUTROS PCT: 49 %
Platelets: 369 10*3/uL (ref 150–400)
RBC: 3.94 MIL/uL (ref 3.87–5.11)
RDW: 15.4 % (ref 11.5–15.5)
WBC: 12.1 10*3/uL — AB (ref 4.0–10.5)

## 2016-04-06 LAB — FERRITIN: FERRITIN: 111 ng/mL (ref 11–307)

## 2016-04-06 LAB — IRON AND TIBC
IRON: 49 ug/dL (ref 28–170)
Saturation Ratios: 18 % (ref 10.4–31.8)
TIBC: 280 ug/dL (ref 250–450)
UIBC: 231 ug/dL

## 2016-04-07 ENCOUNTER — Other Ambulatory Visit (HOSPITAL_COMMUNITY): Payer: Self-pay

## 2016-04-08 ENCOUNTER — Ambulatory Visit (INDEPENDENT_AMBULATORY_CARE_PROVIDER_SITE_OTHER): Payer: BLUE CROSS/BLUE SHIELD | Admitting: Family Medicine

## 2016-04-08 ENCOUNTER — Encounter: Payer: Self-pay | Admitting: Family Medicine

## 2016-04-08 VITALS — BP 120/76 | HR 68 | Temp 97.8°F | Resp 18 | Ht 67.5 in | Wt 208.0 lb

## 2016-04-08 DIAGNOSIS — L02419 Cutaneous abscess of limb, unspecified: Secondary | ICD-10-CM

## 2016-04-08 MED ORDER — SULFAMETHOXAZOLE-TRIMETHOPRIM 800-160 MG PO TABS: 1.0000 | ORAL_TABLET | Freq: Two times a day (BID) | ORAL | 0 refills | Status: DC

## 2016-04-08 MED ORDER — IBUPROFEN 800 MG PO TABS: 800.0000 mg | ORAL_TABLET | Freq: Three times a day (TID) | ORAL | 0 refills | Status: DC | PRN

## 2016-04-08 NOTE — Patient Instructions (Signed)
May change dressing Try to leave gauze wick in the opening  Take the antibiotic as instructed Ibuprofen as needed pain  Return Monday

## 2016-04-08 NOTE — Progress Notes (Signed)
Here for abscess Right axilla for several days, getting bigger Painful, no drainage No fever or chills  Medical history, medications, allergies reviewed ROS negative except as above  Patient here for I&D Had a similar abscess in L armpit years ago.  BP 120/76 (BP Location: Left Arm, Patient Position: Sitting, Cuff Size: Normal)   Pulse 68   Temp 97.8 F (36.6 C) (Oral)   Resp 18   Ht 5' 7.5" (1.715 m)   Wt 208 lb (94.3 kg)   LMP 03/27/2016 (Exact Date)   SpO2 99%   BMI 32.10 kg/m   Right axilla with palpable mass 5 x 6 cm across, tender, indurated. Fluctuant center  CONSENT  Area cleansed with betadine Top of mass infiltrated with 2 cc of 1 % lidocaine Abscess punctured with 11 blade scalpel Large amount green malodorous drainage Forceps to break up loculations Wick placed Dressing applied  Wound care discussed Rx: Septra DS BID x 7 d Ibuprofen 800 TID #30  Patient Instructions  May change dressing Try to leave gauze wick in the opening  Take the antibiotic as instructed Ibuprofen as needed pain  Return Monday

## 2016-04-11 ENCOUNTER — Ambulatory Visit (INDEPENDENT_AMBULATORY_CARE_PROVIDER_SITE_OTHER): Payer: BLUE CROSS/BLUE SHIELD | Admitting: Psychiatry

## 2016-04-11 ENCOUNTER — Encounter (HOSPITAL_COMMUNITY): Payer: Self-pay | Admitting: Psychiatry

## 2016-04-11 ENCOUNTER — Encounter: Payer: Self-pay | Admitting: Family Medicine

## 2016-04-11 ENCOUNTER — Ambulatory Visit (INDEPENDENT_AMBULATORY_CARE_PROVIDER_SITE_OTHER): Payer: BLUE CROSS/BLUE SHIELD | Admitting: Family Medicine

## 2016-04-11 VITALS — BP 118/68 | HR 72 | Temp 97.8°F | Resp 16 | Ht 67.5 in | Wt 210.1 lb

## 2016-04-11 VITALS — BP 106/64 | HR 74 | Ht 67.5 in | Wt 210.0 lb

## 2016-04-11 DIAGNOSIS — F321 Major depressive disorder, single episode, moderate: Secondary | ICD-10-CM

## 2016-04-11 DIAGNOSIS — R69 Illness, unspecified: Secondary | ICD-10-CM | POA: Diagnosis not present

## 2016-04-11 DIAGNOSIS — Z79899 Other long term (current) drug therapy: Secondary | ICD-10-CM

## 2016-04-11 DIAGNOSIS — L02419 Cutaneous abscess of limb, unspecified: Secondary | ICD-10-CM

## 2016-04-11 MED ORDER — BUPROPION HCL ER (XL) 150 MG PO TB24: 150.0000 mg | ORAL_TABLET | Freq: Two times a day (BID) | ORAL | 3 refills | Status: DC

## 2016-04-11 MED ORDER — TRAZODONE HCL 100 MG PO TABS: 100.0000 mg | ORAL_TABLET | Freq: Every day | ORAL | 3 refills | Status: DC

## 2016-04-11 NOTE — Patient Instructions (Addendum)
Finish antibiotics Warm compresses Call for problems

## 2016-04-11 NOTE — Progress Notes (Signed)
Chief Complaint  Patient presents with  . Wound Check   I&D of abscess was performed on Friday. Patient has done wound care for the weekend. The wick fell out this morning. She is taking her antibiotic without difficulty. Her pain has improved. No fever or chills or complications.  Patient Active Problem List   Diagnosis Date Noted  . Need for prophylactic vaccination and inoculation against influenza 11/17/2015  . Crohn's disease of ileum with complication ([place]) 40/76/8088  . Annual physical exam 10/28/2014  . Fever blister 02/21/2014  . Iron deficiency anemia 08/28/2013  . Obesity (BMI 30.0-34.9) 04/04/2012  . Bipolar disorder ([place]) 04/03/2012  . Hx of nicotine dependence 11/14/2011  . Medicare annual wellness visit, subsequent 11/14/2011  . High risk for colon cancer 03/15/2011  . Leukocytosis 10/06/2010  . ECHOCARDIOGRAM, ABNORMAL 01/11/2008  . ANEMIA, B12 DEFICIENCY 12/29/2005  . ANXIETY 12/29/2005  . Depression, controlled 12/29/2005  . Allergic rhinitis 12/29/2005  . GERD 12/29/2005  . PEPTIC ULCER DISEASE 12/29/2005    Outpatient Encounter Prescriptions as of 04/11/2016  Medication Sig  . acetaminophen (TYLENOL) 500 MG tablet Take 1,000 mg by mouth every 6 (six) hours as needed for mild pain.  Marland Kitchen acyclovir (ZOVIRAX) 400 MG tablet Take 400 mg by mouth as needed (outbreaks).   Marland Kitchen buPROPion (WELLBUTRIN XL) 150 MG 24 hr tablet Take 1 tablet (150 mg total) by mouth 2 (two) times daily.  . calcium-vitamin D (OSCAL WITH D) 500-200 MG-UNIT tablet Take 1 tablet by mouth daily.  . cetirizine (ZYRTEC) 10 MG tablet Take 10 mg by mouth daily as needed for allergies.   . chlorpheniramine (CHLOR-TRIMETON) 4 MG tablet Take 4 mg by mouth 2 (two) times daily as needed for allergies.  . Cyanocobalamin (B-12) 1000 MCG CAPS Take 1 capsule by mouth daily.    . diphenhydrAMINE (BENADRYL) 25 mg capsule Take 25 mg by mouth daily as needed. For allergies   . fluticasone (FLONASE) 50 MCG/ACT  nasal spray SHAKE LIQUID AND USE 2 SPRAYS IN EACH NOSTRIL EVERY DAY  . HUMIRA PEN 40 MG/0.8ML PNKT INJECT THE CONTENTS OF 1 PEN (40 MG) UNDER THE SKIN EVERY 14 DAYS  . ibuprofen (ADVIL,MOTRIN) 800 MG tablet Take 1 tablet (800 mg total) by mouth every 8 (eight) hours as needed.  . iron polysaccharides (NIFEREX) 150 MG capsule Take 1 capsule (150 mg total) by mouth daily.  . Liniments (SALONPAS ARTHRITIS PAIN RELIEF) PADS Apply 1 each topically every 8 (eight) hours as needed (pain). pain   . Multiple Vitamin (MULTIVITAMIN WITH MINERALS) TABS tablet Take 1 tablet by mouth daily.  . Omega-3 Fatty Acids (FISH OIL) 1200 MG CAPS Take 1,200 mg by mouth daily.   Marland Kitchen omeprazole (PRILOSEC) 20 MG capsule TAKE ONE (1) CAPSULE BY MOUTH TWICE A DAY BEFORE A MEAL  . polyethylene glycol-electrolytes (TRILYTE) 420 g solution Take 4,000 mLs by mouth as directed.  . potassium chloride SA (K-DUR,KLOR-CON) 20 MEQ tablet TAKE 1 TABLET(20 MEQ) BY MOUTH TWICE DAILY  . PROAIR HFA 108 (90 Base) MCG/ACT inhaler INHALE 2 PUFFS BY MOUTH EVERY 6 HOURS AS NEEDED FOR WHEEZING  . Probiotic Product (PROBIOTIC PO) Take 1 tablet by mouth daily.  Marland Kitchen PROCTOZONE-HC 2.5 % rectal cream USE RECTALLY TWICE DAILY FOR 7 DAYS  . sulfamethoxazole-trimethoprim (BACTRIM DS,SEPTRA DS) 800-160 MG tablet Take 1 tablet by mouth 2 (two) times daily.  . traZODone (DESYREL) 100 MG tablet Take 1 tablet (100 mg total) by mouth at bedtime.  . [DISCONTINUED]  buPROPion (WELLBUTRIN XL) 150 MG 24 hr tablet Take 1 tablet (150 mg total) by mouth 2 (two) times daily.  . [DISCONTINUED] traZODone (DESYREL) 100 MG tablet Take 1 tablet (100 mg total) by mouth at bedtime.   No facility-administered encounter medications on file as of 04/11/2016.     Allergies  Allergen Reactions  . Feraheme [Ferumoxytol] Anaphylaxis  . Oxycodone-Acetaminophen Itching  . Oxycodone-Acetaminophen Itching  . Peanut-Containing Drug Products     Pecans and walnuts also  . Shellfish  Allergy   . Tramadol Hcl Itching    Review of Systems  Constitutional: Negative for activity change, appetite change, chills and fever.  All other systems reviewed and are negative.  BP 118/68 (BP Location: Right Arm, Patient Position: Sitting, Cuff Size: Normal)   Pulse 72   Temp 97.8 F (36.6 C) (Oral)   Resp 16   Ht 5' 7.5" (1.715 m)   Wt 210 lb 1.9 oz (95.3 kg)   LMP 03/27/2016 (Exact Date)   SpO2 99%   BMI 32.42 kg/m   Physical Exam  Constitutional: She appears well-developed and well-nourished. No distress.  Skin:  Right axilla is examined. Induration has reduced to 3 x 4 cm. Tenderness is improved. No erythema skin. 1 cm opening still present, probed to 2 cm depth with no purulence. Scant serous drainage.    ASSESSMENT/PLAN:  1. Abscess of axilla Improving.   Patient Instructions  Finish antibiotics Warm compresses Call for problems   Raylene Everts, MD

## 2016-04-11 NOTE — Progress Notes (Signed)
Patient ID: [name], female   DOB: 1970/07/10, 46 y.o.   MRN: [id number] Patient ID: KIMERLY ROWAND, female   DOB: Apr 18, 1970, 46 y.o.   MRN: [id number] Patient ID: AIVY AKTER, female   DOB: [birth year], 46 y.o.   MRN: [id number] Patient ID: YUMIKO ALKINS, female   DOB: 06/05/1970, 46 y.o.   MRN: [id number] Patient ID: MARISHA RENIER, female   DOB: [birth year], 46 y.o.   MRN: [id number] Patient ID: TEDDI BADALAMENTI, female   DOB: 1970/08/01, 46 y.o.   MRN: [id number] Patient ID: BEA DUREN, female   DOB: 09-23-1970, 46 y.o.   MRN: [id number] Patient ID: SEMAYA VIDA, female   DOB: Jul 10, 1970, 46 y.o.   MRN: [id number] Patient ID: KINGSTON GUILES, female   DOB: 17-Dec-1970, 46 y.o.   MRN: [id number] Patient ID: KELISHA DALL, female   DOB: June 19, 1970, 46 y.o.   MRN: [id number]  [place] [id number] Progress Note  SHANEEKA SCARBORO [id number] 46 y.o.  04/11/2016 2:51 PM  Chief Complaint: "I've been doing well  History of Present Illness:  This patient is a 46 year old black female who is married and lives with her 2 children and husband in [place]. She is on disability for Crohn's disease.  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 She got severely constipated with a high fever . Once it was determined what was wrong she's had an up-and-down course with her Crohn's. Currently is under good control because she has figured out which foods make it worse. The patient got depressed after developing Crohn's and was hospitalized twice at behavioral health hospital in the past. this was complicated by the fact that she was abusing marijuana.  The patient returns after 4 months. 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 She is enjoying these very much. By the end of next spring she will earned an associates degree in business. She states that it helps her to keep her mind busy so she doesn't ruminate about other things. For the most  part she sleeping well and her mood is good and she is focusing well. She's had occasional flareups of her Crohn's disease but nothing overly serious. She does not have any new symptoms to report and states that her medications are still working very well for her depression and anxiety    Suicidal Ideation: No Plan Formed: No Patient has means to carry out plan: No  Homicidal Ideation: No Plan Formed: No Patient has means to carry out plan: No  Review of Systems: Psychiatric: Agitation: No Hallucination: No Depressed Mood: No Insomnia: Yes Hypersomnia: No Altered Concentration: No Feels Worthless: No Grandiose Ideas: No Belief In Special Powers: No New/Increased Substance Abuse: No Compulsions: No  Neurologic: Headache: Yes Seizure: No Paresthesias: No  Social History: Patient lives with her husband and children.  She has one son and 2 daughter.  She has a high school education.  She likes reading listing music walking and fishing.  Medical history Patient has multiple medical issues.  She has asthma, Crohn's disease, acid reflux, headaches, anemia, sleep apnea and arthritis.  Her primary care physician is Dr. Moshe Cipro.  Her neurologist is Dr Wylie Hail and her gastroenterologist is Dr. Quincy Sheehan.   Alcohol and substance use history. Patient admitted using marijuana but claims to be sober since 2012. Outpatient Encounter Prescriptions as of 04/11/2016  Medication Sig Dispense Refill  . acetaminophen (TYLENOL) 500 MG tablet Take 1,000  mg by mouth every 6 (six) hours as needed for mild pain.    Marland Kitchen acyclovir (ZOVIRAX) 400 MG tablet Take 400 mg by mouth as needed (outbreaks).   2  . buPROPion (WELLBUTRIN XL) 150 MG 24 hr tablet Take 1 tablet (150 mg total) by mouth 2 (two) times daily. 180 tablet 3  . calcium-vitamin D (OSCAL WITH D) 500-200 MG-UNIT tablet Take 1 tablet by mouth daily.    . cetirizine (ZYRTEC) 10 MG tablet Take 10 mg by mouth daily as needed for allergies.     .  Cyanocobalamin (B-12) 1000 MCG CAPS Take 1 capsule by mouth daily.      . diphenhydrAMINE (BENADRYL) 25 mg capsule Take 25 mg by mouth daily as needed. For allergies     . fluticasone (FLONASE) 50 MCG/ACT nasal spray SHAKE LIQUID AND USE 2 SPRAYS IN EACH NOSTRIL EVERY DAY 16 g 2  . HUMIRA PEN 40 MG/0.8ML PNKT INJECT THE CONTENTS OF 1 PEN (40 MG) UNDER THE SKIN EVERY 14 DAYS 2 each 3  . ibuprofen (ADVIL,MOTRIN) 800 MG tablet Take 1 tablet (800 mg total) by mouth every 8 (eight) hours as needed. 30 tablet 0  . iron polysaccharides (NIFEREX) 150 MG capsule Take 1 capsule (150 mg total) by mouth daily. 30 capsule 11  . Liniments (SALONPAS ARTHRITIS PAIN RELIEF) PADS Apply 1 each topically every 8 (eight) hours as needed (pain). pain     . Multiple Vitamin (MULTIVITAMIN WITH MINERALS) TABS tablet Take 1 tablet by mouth daily.    . Omega-3 Fatty Acids (FISH OIL) 1200 MG CAPS Take 1,200 mg by mouth daily.     Marland Kitchen omeprazole (PRILOSEC) 20 MG capsule TAKE ONE (1) CAPSULE BY MOUTH TWICE A DAY BEFORE A MEAL 60 capsule 3  . potassium chloride SA (K-DUR,KLOR-CON) 20 MEQ tablet TAKE 1 TABLET(20 MEQ) BY MOUTH TWICE DAILY 60 tablet 1  . PROAIR HFA 108 (90 Base) MCG/ACT inhaler INHALE 2 PUFFS BY MOUTH EVERY 6 HOURS AS NEEDED FOR WHEEZING 8.5 g 0  . Probiotic Product (PROBIOTIC PO) Take 1 tablet by mouth daily.    Marland Kitchen sulfamethoxazole-trimethoprim (BACTRIM DS,SEPTRA DS) 800-160 MG tablet Take 1 tablet by mouth 2 (two) times daily. 14 tablet 0  . traZODone (DESYREL) 100 MG tablet Take 1 tablet (100 mg total) by mouth at bedtime. 90 tablet 3  . [DISCONTINUED] buPROPion (WELLBUTRIN XL) 150 MG 24 hr tablet Take 1 tablet (150 mg total) by mouth 2 (two) times daily. 60 tablet 3  . [DISCONTINUED] traZODone (DESYREL) 100 MG tablet Take 1 tablet (100 mg total) by mouth at bedtime. 30 tablet 3  . chlorpheniramine (CHLOR-TRIMETON) 4 MG tablet Take 4 mg by mouth 2 (two) times daily as needed for allergies.    . polyethylene  glycol-electrolytes (TRILYTE) 420 g solution Take 4,000 mLs by mouth as directed. (Patient not taking: Reported on 04/11/2016) 4000 mL 0  . PROCTOZONE-HC 2.5 % rectal cream USE RECTALLY TWICE DAILY FOR 7 DAYS (Patient not taking: Reported on 04/11/2016) 30 g 1   No facility-administered encounter medications on file as of 04/11/2016.     Past Psychiatric History/Hospitalization(s): Patient has multiple psychiatric hospitalizations in the past.  Her last psychiatric hospitalization in 2005 but she also admitted hospitalization in 2008 which we do not have details.  She has history of taking overdose on her medication .  She has history of depression and she was also diagnosed with bipolar disorder.  In the past she has taken Seroquel, Risperdal,  Celexa, Lexapro, and Xanax.  She has seen psychologist in the past.  Anxiety: No Bipolar Disorder: Yes Depression: Yes Mania: Yes Psychosis: No Schizophrenia: No Personality Disorder: No Hospitalization for psychiatric illness: Yes History of Electroconvulsive Shock Therapy: No Prior Suicide Attempts: No  Physical Exam: Constitutional:  BP 106/64 (BP Location: Right Arm, Patient Position: Sitting, Cuff Size: Normal)   Pulse 74   Ht 5' 7.5" (1.715 m)   Wt 210 lb (95.3 kg)   LMP 03/27/2016 (Exact Date)   BMI 32.41 kg/m   General Appearance: alert, oriented, no acute distress and well nourished  Musculoskeletal: Strength & Muscle Tone: within normal limits Gait & Station: normal Patient leans: N/A  Psychiatric: Speech (describe rate, volume, coherence, spontaneity, and abnormalities if any):  clear and coherent normal tone and volume.    Thought Process (describe rate, content, abstract reasoning, and computation):logical and goal-directed.    Associations: Coherent and Intact  Thoughts: normal  Mental Status: Orientation: oriented to person, place and time/date Mood & Affect: Mood is good and affect is bright Attention Span &  Concentration:  fair   Medical Decision Making (Choose Three): Established Problem, Stable/Improving (1), Review of Psycho-Social Stressors (1), Review or order clinical lab tests (1), Review and summation of old records (2), Review of Last Therapy Session (1) and Review of Medication Regimen & Side Effects (2)  Assessment: Axis I: Maj. depressive disorder, recurrent  Axis II: deferred  Axis III:  Patient Active Problem List   Diagnosis Date Noted  . Need for prophylactic vaccination and inoculation against influenza 11/17/2015  . Crohn's disease of ileum with complication ([place]) 10/93/2355  . Annual physical exam 10/28/2014  . Fever blister 02/21/2014  . Iron deficiency anemia 08/28/2013  . Obesity (BMI 30.0-34.9) 04/04/2012  . Bipolar disorder ([place]) 04/03/2012  . Hx of nicotine dependence 11/14/2011  . Medicare annual wellness visit, subsequent 11/14/2011  . High risk for colon cancer 03/15/2011  . Leukocytosis 10/06/2010  . ECHOCARDIOGRAM, ABNORMAL 01/11/2008  . ANEMIA, B12 DEFICIENCY 12/29/2005  . ANXIETY 12/29/2005  . Depression, controlled 12/29/2005  . Allergic rhinitis 12/29/2005  . GERD 12/29/2005  . PEPTIC ULCER DISEASE 12/29/2005    Axis IV: mild to moderate  Axis V:  55-60   Plan:. Patient will continue Wellbutrin XL 150 mg twice a day for depression andtrazodone to 100 mg daily at bedtime to help with sleep.  I recommend to call us back if she is any question of  worsening of the symptom.  Otherwise I will see her in 4 months Time spent 15 minutes.  More than 50% of the time spent and psychoeducation, counseling and coordination of care.    Levonne Spiller, MD 04/11/2016

## 2016-04-19 ENCOUNTER — Other Ambulatory Visit: Payer: Self-pay | Admitting: Family Medicine

## 2016-04-26 ENCOUNTER — Ambulatory Visit (INDEPENDENT_AMBULATORY_CARE_PROVIDER_SITE_OTHER): Payer: BLUE CROSS/BLUE SHIELD | Admitting: Family Medicine

## 2016-04-26 ENCOUNTER — Encounter: Payer: Self-pay | Admitting: Family Medicine

## 2016-04-26 VITALS — BP 112/70 | HR 80 | Temp 97.5°F | Resp 16 | Ht 67.5 in | Wt 210.1 lb

## 2016-04-26 DIAGNOSIS — R600 Localized edema: Secondary | ICD-10-CM | POA: Diagnosis not present

## 2016-04-26 LAB — COMPLETE METABOLIC PANEL WITH GFR
ALT: 10 U/L (ref 6–29)
AST: 12 U/L (ref 10–35)
Albumin: 3.3 g/dL — ABNORMAL LOW (ref 3.6–5.1)
Alkaline Phosphatase: 61 U/L (ref 33–115)
BILIRUBIN TOTAL: 0.2 mg/dL (ref 0.2–1.2)
BUN: 7 mg/dL (ref 7–25)
CHLORIDE: 103 mmol/L (ref 98–110)
CO2: 26 mmol/L (ref 20–31)
CREATININE: 0.9 mg/dL (ref 0.50–1.10)
Calcium: 8.9 mg/dL (ref 8.6–10.2)
GFR, Est African American: 89 mL/min (ref >=60)
GFR, Est Non African American: 77 mL/min (ref >=60)
GLUCOSE: 76 mg/dL (ref 65–99)
Potassium: 4.8 mmol/L (ref 3.5–5.3)
Sodium: 137 mmol/L (ref 135–146)
TOTAL PROTEIN: 7.2 g/dL (ref 6.1–8.1)

## 2016-04-26 MED ORDER — FUROSEMIDE 20 MG PO TABS: 20.0000 mg | ORAL_TABLET | Freq: Every day | ORAL | 0 refills | Status: DC

## 2016-04-26 NOTE — Progress Notes (Signed)
Chief Complaint  Patient presents with  . Leg Swelling    x 2 weeks   New onset edema L>R Not uncomfortable Worse at end of the day No change in diet or salt excess No new medicines  No history of heart disease or HTN No history of liver or kidney disease No known varicose veins or vascular problem No change in weight    Patient Active Problem List   Diagnosis Date Noted  . Need for prophylactic vaccination and inoculation against influenza 11/17/2015  . Crohn's disease of ileum with complication ([place]) 97/04/6376  . Annual physical exam 10/28/2014  . Iron deficiency anemia 08/28/2013  . Obesity (BMI 30.0-34.9) 04/04/2012  . Bipolar disorder ([place]) 04/03/2012  . Hx of nicotine dependence 11/14/2011  . Medicare annual wellness visit, subsequent 11/14/2011  . High risk for colon cancer 03/15/2011  . Leukocytosis 10/06/2010  . ECHOCARDIOGRAM, ABNORMAL 01/11/2008  . ANEMIA, B12 DEFICIENCY 12/29/2005  . ANXIETY 12/29/2005  . Depression, controlled 12/29/2005  . Allergic rhinitis 12/29/2005  . GERD 12/29/2005  . PEPTIC ULCER DISEASE 12/29/2005    Outpatient Encounter Prescriptions as of 04/26/2016  Medication Sig  . acetaminophen (TYLENOL) 500 MG tablet Take 1,000 mg by mouth every 6 (six) hours as needed for mild pain.  Marland Kitchen acyclovir (ZOVIRAX) 400 MG tablet TAKE 1 TABLET(400 MG) BY MOUTH THREE TIMES DAILY  . buPROPion (WELLBUTRIN XL) 150 MG 24 hr tablet Take 1 tablet (150 mg total) by mouth 2 (two) times daily.  . calcium-vitamin D (OSCAL WITH D) 500-200 MG-UNIT tablet Take 1 tablet by mouth daily.  . cetirizine (ZYRTEC) 10 MG tablet Take 10 mg by mouth daily as needed for allergies.   . chlorpheniramine (CHLOR-TRIMETON) 4 MG tablet Take 4 mg by mouth 2 (two) times daily as needed for allergies.  . Cyanocobalamin (B-12) 1000 MCG CAPS Take 1 capsule by mouth daily.    . diphenhydrAMINE (BENADRYL) 25 mg capsule Take 25 mg by mouth daily as needed. For allergies   .  fluticasone (FLONASE) 50 MCG/ACT nasal spray SHAKE LIQUID AND USE 2 SPRAYS IN EACH NOSTRIL EVERY DAY  . HUMIRA PEN 40 MG/0.8ML PNKT INJECT THE CONTENTS OF 1 PEN (40 MG) UNDER THE SKIN EVERY 14 DAYS  . ibuprofen (ADVIL,MOTRIN) 800 MG tablet Take 1 tablet (800 mg total) by mouth every 8 (eight) hours as needed.  . iron polysaccharides (NIFEREX) 150 MG capsule Take 1 capsule (150 mg total) by mouth daily.  . Liniments (SALONPAS ARTHRITIS PAIN RELIEF) PADS Apply 1 each topically every 8 (eight) hours as needed (pain). pain   . Multiple Vitamin (MULTIVITAMIN WITH MINERALS) TABS tablet Take 1 tablet by mouth daily.  . Omega-3 Fatty Acids (FISH OIL) 1200 MG CAPS Take 1,200 mg by mouth daily.   Marland Kitchen omeprazole (PRILOSEC) 20 MG capsule TAKE ONE (1) CAPSULE BY MOUTH TWICE A DAY BEFORE A MEAL  . potassium chloride SA (K-DUR,KLOR-CON) 20 MEQ tablet TAKE 1 TABLET(20 MEQ) BY MOUTH TWICE DAILY  . PROAIR HFA 108 (90 Base) MCG/ACT inhaler INHALE 2 PUFFS BY MOUTH EVERY 6 HOURS AS NEEDED FOR WHEEZING  . Probiotic Product (PROBIOTIC PO) Take 1 tablet by mouth daily.  Marland Kitchen PROCTOZONE-HC 2.5 % rectal cream USE RECTALLY TWICE DAILY FOR 7 DAYS  . traZODone (DESYREL) 100 MG tablet Take 1 tablet (100 mg total) by mouth at bedtime.  . furosemide (LASIX) 20 MG tablet Take 1 tablet (20 mg total) by mouth daily. If needed swelling   No facility-administered  encounter medications on file as of 04/26/2016.     Allergies  Allergen Reactions  . Feraheme [Ferumoxytol] Anaphylaxis  . Oxycodone-Acetaminophen Itching  . Oxycodone-Acetaminophen Itching  . Peanut-Containing Drug Products     Pecans and walnuts also  . Shellfish Allergy   . Tramadol Hcl Itching   Review of Systems  Constitutional: Negative for activity change, appetite change and unexpected weight change.  HENT: Negative for congestion, postnasal drip and rhinorrhea.   Eyes: Negative for photophobia and visual disturbance.  Respiratory: Negative for cough and  shortness of breath.   Cardiovascular: Positive for leg swelling. Negative for chest pain and palpitations.  Gastrointestinal: Negative for constipation and diarrhea.  Endocrine: Negative for cold intolerance and heat intolerance.  Genitourinary: Negative for difficulty urinating and menstrual problem.  Musculoskeletal: Negative for arthralgias and back pain.  Skin: Positive for wound.       Abscess healed  Neurological: Negative for dizziness and headaches.  Hematological: Negative for adenopathy. Does not bruise/bleed easily.  Psychiatric/Behavioral: Negative for sleep disturbance. The patient is not nervous/anxious.    BP 112/70 (BP Location: Right Arm, Patient Position: Sitting, Cuff Size: Normal)   Pulse 80   Temp 97.5 F (36.4 C) (Oral)   Resp 16   Ht 5' 7.5" (1.715 m)   Wt 210 lb 1.9 oz (95.3 kg)   LMP 03/13/2016 (Exact Date)   SpO2 99%   BMI 32.42 kg/m   Physical Exam  Constitutional: She is oriented to person, place, and time. She appears well-developed and well-nourished.  HENT:  Head: Normocephalic and atraumatic.  Right Ear: External ear normal.  Left Ear: External ear normal.  Mouth/Throat: Oropharynx is clear and moist.  Eyes: Conjunctivae are normal. Pupils are equal, round, and reactive to light.  Neck: Normal range of motion. Neck supple. No thyromegaly present.  Cardiovascular: Normal rate, regular rhythm and normal heart sounds.   Pulmonary/Chest: Effort normal and breath sounds normal. No respiratory distress. She has no rales.  Abdominal: Soft. Bowel sounds are normal.  Musculoskeletal: Normal range of motion. She exhibits edema.  Trace pitting edema of ankles.  NO calf tenderness.  No varicosities noted  Lymphadenopathy:    She has no cervical adenopathy.  Neurological: She is alert and oriented to person, place, and time.  Gait normal  Skin: Skin is warm and dry.  Psychiatric: She has a normal mood and affect. Her behavior is normal. Thought content  normal.  Nursing note and vitals reviewed.   ASSESSMENT/PLAN:  1. Pedal edema  - CBC - COMPLETE METABOLIC PANEL WITH GFR - Urinalysis, Routine w reflex microscopic - TSH  Patient Instructions  Limit salt in diet Take the fluid pill if you are uncomfortable with edema If you take the furosemide - eat potassium foods like Orange juice or bananas I have ordered blood tests I will send you a letter with your test results.  If there is anything of concern, we will call right away. Call if not better in a few days Pay attention to whether this might be pre menstrual     Raylene Everts, MD

## 2016-04-26 NOTE — Patient Instructions (Addendum)
Limit salt in diet Take the fluid pill if you are uncomfortable with edema If you take the furosemide - eat potassium foods like Orange juice or bananas I have ordered blood tests I will send you a letter with your test results.  If there is anything of concern, we will call right away. Call if not better in a few days Pay attention to whether this might be pre menstrual

## 2016-04-27 LAB — URINALYSIS, ROUTINE W REFLEX MICROSCOPIC
BILIRUBIN URINE: NEGATIVE
GLUCOSE, UA: NEGATIVE
Hgb urine dipstick: NEGATIVE
KETONES UR: NEGATIVE
Nitrite: NEGATIVE
PH: 6.5 (ref 5.0–8.0)
Protein, ur: NEGATIVE
SPECIFIC GRAVITY, URINE: 1.01 (ref 1.001–1.035)

## 2016-04-27 LAB — URINALYSIS, MICROSCOPIC ONLY
Bacteria, UA: NONE SEEN [HPF]
CASTS: NONE SEEN [LPF]
Crystals: NONE SEEN [HPF]
RBC / HPF: NONE SEEN RBC/HPF (ref <=2)
Yeast: NONE SEEN [HPF]

## 2016-04-27 LAB — CBC
HCT: 35.8 % (ref 35.0–45.0)
Hemoglobin: 11.5 g/dL — ABNORMAL LOW (ref 11.7–15.5)
MCH: 28.1 pg (ref 27.0–33.0)
MCHC: 32.1 g/dL (ref 32.0–36.0)
MCV: 87.5 fL (ref 80.0–100.0)
MPV: 9.1 fL (ref 7.5–12.5)
PLATELETS: 394 10*3/uL (ref 140–400)
RBC: 4.09 MIL/uL (ref 3.80–5.10)
RDW: 15.1 % — AB (ref 11.0–15.0)
WBC: 16.8 10*3/uL — AB (ref 3.8–10.8)

## 2016-04-27 LAB — TSH: TSH: 1.92 mIU/L

## 2016-04-28 ENCOUNTER — Encounter: Payer: Self-pay | Admitting: Family Medicine

## 2016-05-09 DIAGNOSIS — L700 Acne vulgaris: Secondary | ICD-10-CM | POA: Diagnosis not present

## 2016-05-17 ENCOUNTER — Encounter: Payer: Self-pay | Admitting: Family Medicine

## 2016-05-17 ENCOUNTER — Encounter (HOSPITAL_COMMUNITY): Payer: Self-pay

## 2016-05-17 ENCOUNTER — Ambulatory Visit (INDEPENDENT_AMBULATORY_CARE_PROVIDER_SITE_OTHER): Payer: BLUE CROSS/BLUE SHIELD | Admitting: Family Medicine

## 2016-05-17 VITALS — BP 112/72 | HR 84 | Resp 15 | Ht 68.0 in | Wt 206.0 lb

## 2016-05-17 DIAGNOSIS — E669 Obesity, unspecified: Secondary | ICD-10-CM | POA: Diagnosis not present

## 2016-05-17 DIAGNOSIS — Z23 Encounter for immunization: Secondary | ICD-10-CM

## 2016-05-17 DIAGNOSIS — F411 Generalized anxiety disorder: Secondary | ICD-10-CM | POA: Diagnosis not present

## 2016-05-17 DIAGNOSIS — D509 Iron deficiency anemia, unspecified: Secondary | ICD-10-CM | POA: Diagnosis not present

## 2016-05-17 DIAGNOSIS — D72829 Elevated white blood cell count, unspecified: Secondary | ICD-10-CM | POA: Diagnosis not present

## 2016-05-17 DIAGNOSIS — J302 Other seasonal allergic rhinitis: Secondary | ICD-10-CM

## 2016-05-17 MED ORDER — MONTELUKAST SODIUM 10 MG PO TABS: 10.0000 mg | ORAL_TABLET | Freq: Every day | ORAL | 5 refills | Status: DC

## 2016-05-17 MED ORDER — BENZONATATE 100 MG PO CAPS: 100.0000 mg | ORAL_CAPSULE | Freq: Two times a day (BID) | ORAL | 0 refills | Status: DC | PRN

## 2016-05-17 MED ORDER — AZELASTINE HCL 0.1 % NA SOLN: 2.0000 | Freq: Two times a day (BID) | NASAL | 12 refills | Status: DC

## 2016-05-17 NOTE — Patient Instructions (Addendum)
Annual wellness with [name] or  WEd afternoon appt, between 1 and 3 pm preferred  Annual physical exam with MD Sept 14 or after, call if you need me sooner  Pneumonia vaccine today, (23)  You are coughing and having nasal drainage from uncontrolled allergies please commit to two different sprays daily, saline nasal flushes and once daily singulair, also zyrtec   Please get fasting lipid, CBC  And cmp 5 days before physical exam  You WILL  [place] just please ask for help  Thank you  for choosing [facility]. We consider it a privelige to serve you.  Delivering excellent health care in a caring and  compassionate way is our goal.  Partnering with you,  so that together we can achieve this goal is our strategy.

## 2016-05-18 DIAGNOSIS — Z23 Encounter for immunization: Secondary | ICD-10-CM | POA: Insufficient documentation

## 2016-05-18 NOTE — Assessment & Plan Note (Signed)
Improved. Patient re-educated about  the importance of commitment to a  minimum of 150 minutes of exercise per week.  The importance of healthy food choices with portion control discussed. Encouraged to start a food diary, count calories and to consider  joining a support group. Sample diet sheets offered. Goals set by the patient for the next several months.   Weight /BMI 05/17/2016 04/26/2016 04/11/2016  WEIGHT 206 lb 210 lb 1.9 oz 210 lb 1.9 oz  HEIGHT 5' 8"  5' 7.5" 5' 7.5"  BMI 31.32 kg/m2 32.42 kg/m2 32.42 kg/m2  Some encounter information is confidential and restricted. Go to Review Flowsheets activity to see all data.

## 2016-05-18 NOTE — Assessment & Plan Note (Signed)
After obtaining informed consent, the vaccine is  administered by LPN.  

## 2016-05-18 NOTE — Assessment & Plan Note (Signed)
Increased symptoms due to stress at school, pt encouraged to review classes with teacher and to get the necessary help

## 2016-05-18 NOTE — Progress Notes (Signed)
   [name]     MRN: [id number]      DOB: 12/04/1970   HPI [name] is here for follow up and re-evaluation of chronic medical conditions, medication management and review of any available recent lab and radiology data.  Preventive health is updated, specifically  Cancer screening and Immunization.   Questions or concerns regarding consultations or procedures which the PT has had in the interim are  Addressed.[place] followed by psychiatry and doing well, although she reports feeling overwhelmed with school. States that she was exposed to flu and infection last month and had intermittent fever , chills and "was sick" and actually stopped humara for a short time as a result. The PT denies any adverse reactions to current medications since the last visit.  2 week h/o facial pressure and clear runny nose, also sneezing and watery eyes, no current fever, body aches or sick feeling. Not using flonase daily  ROS C/o chest congestion, productive cough clear sputum, denies wheezing Denies chest pains, palpitations and leg swelling Denies abdominal pain, nausea, vomiting,diarrhea or constipation.   Denies dysuria, frequency, hesitancy or incontinence. Denies joint pain, swelling and limitation in mobility. Denies headaches, seizures, numbness, or tingling. Denies depression,  C/o increased stress and anxiety or insomnia. Denies skin break down or rash.   PE  BP 112/72   Pulse 84   Resp 15   Ht 5' 8"  (1.727 m)   Wt 206 lb (93.4 kg)   SpO2 99%   BMI 31.32 kg/m   Patient alert and oriented and in no cardiopulmonary distress.  HEENT: No facial asymmetry, EOMI,   oropharynx pink and moist.  Neck supple no JVD, no mass.Maxillary tenderness, erythema and edema of nasal mucosa, TM clear, no cervical adenopathy Chest: Clear to auscultation bilaterally.  CVS: S1, S2 no murmurs, no S3.Regular rate.  ABD: Soft non tender.   Ext: No edema  MS: Adequate ROM spine, shoulders, hips and  knees.  Skin: Intact, no ulcerations or rash noted.  Psych: Good eye contact, normal affect. Memory intact not anxious or depressed appearing.  CNS: CN 2-12 intact, power,  normal throughout.no focal deficits noted.   Assessment & Plan  Allergic rhinitis Uncontrolled , commit to daily medications as prescribed for symptom control, Astelin, Flonase, Singulair and zyrtec also saline flushes  Anxiety state Increased symptoms due to stress at school, pt encouraged to review classes with teacher and to get the necessary help  Obesity (BMI 30.0-34.9) Improved. Patient re-educated about  the importance of commitment to a  minimum of 150 minutes of exercise per week.  The importance of healthy food choices with portion control discussed. Encouraged to start a food diary, count calories and to consider  joining a support group. Sample diet sheets offered. Goals set by the patient for the next several months.   Weight /BMI 05/17/2016 04/26/2016 04/11/2016  WEIGHT 206 lb 210 lb 1.9 oz 210 lb 1.9 oz  HEIGHT 5' 8"  5' 7.5" 5' 7.5"  BMI 31.32 kg/m2 32.42 kg/m2 32.42 kg/m2  Some encounter information is confidential and restricted. Go to Review Flowsheets activity to see all data.      Need for 23-polyvalent pneumococcal polysaccharide vaccine After obtaining informed consent, the vaccine is  administered by LPN.

## 2016-05-18 NOTE — Assessment & Plan Note (Signed)
Uncontrolled , commit to daily medications as prescribed for symptom control, Astelin, Flonase, Singulair and zyrtec also saline flushes

## 2016-05-19 ENCOUNTER — Other Ambulatory Visit: Payer: Self-pay | Admitting: Family Medicine

## 2016-05-25 ENCOUNTER — Encounter: Payer: Self-pay | Admitting: Family Medicine

## 2016-05-26 ENCOUNTER — Telehealth: Payer: Self-pay | Admitting: Family Medicine

## 2016-05-26 DIAGNOSIS — R252 Cramp and spasm: Secondary | ICD-10-CM

## 2016-05-26 NOTE — Telephone Encounter (Signed)
pls order magnesium level and cK level , I have sent her a message to get blood draw for cramps

## 2016-05-27 DIAGNOSIS — R252 Cramp and spasm: Secondary | ICD-10-CM | POA: Insufficient documentation

## 2016-05-27 NOTE — Telephone Encounter (Signed)
Labs ordered.

## 2016-05-30 DIAGNOSIS — R252 Cramp and spasm: Secondary | ICD-10-CM | POA: Diagnosis not present

## 2016-05-31 ENCOUNTER — Encounter: Payer: Self-pay | Admitting: Family Medicine

## 2016-05-31 LAB — MAGNESIUM: MAGNESIUM: 1.6 mg/dL (ref 1.5–2.5)

## 2016-05-31 LAB — CK: Total CK: 63 U/L (ref 7–177)

## 2016-06-14 ENCOUNTER — Other Ambulatory Visit: Payer: Self-pay | Admitting: Gastroenterology

## 2016-06-14 ENCOUNTER — Other Ambulatory Visit (HOSPITAL_COMMUNITY): Payer: Self-pay

## 2016-06-14 DIAGNOSIS — D509 Iron deficiency anemia, unspecified: Secondary | ICD-10-CM

## 2016-06-14 MED ORDER — POLYSACCHARIDE IRON COMPLEX 150 MG PO CAPS: 150.0000 mg | ORAL_CAPSULE | Freq: Every day | ORAL | 11 refills | Status: DC

## 2016-06-14 NOTE — Telephone Encounter (Signed)
Received refill request from patients pharmacy for oral iron. Reviewed with PA-C, chart checked and refilled.

## 2016-06-15 ENCOUNTER — Encounter (HOSPITAL_COMMUNITY): Payer: Self-pay | Admitting: Oncology

## 2016-07-12 ENCOUNTER — Other Ambulatory Visit: Payer: Self-pay | Admitting: Nurse Practitioner

## 2016-08-09 ENCOUNTER — Ambulatory Visit (INDEPENDENT_AMBULATORY_CARE_PROVIDER_SITE_OTHER): Payer: BLUE CROSS/BLUE SHIELD | Admitting: Psychiatry

## 2016-08-09 ENCOUNTER — Encounter (HOSPITAL_COMMUNITY): Payer: Self-pay | Admitting: Psychiatry

## 2016-08-09 VITALS — BP 97/61 | HR 67 | Ht 68.0 in | Wt 212.0 lb

## 2016-08-09 DIAGNOSIS — R69 Illness, unspecified: Secondary | ICD-10-CM | POA: Diagnosis not present

## 2016-08-09 DIAGNOSIS — F321 Major depressive disorder, single episode, moderate: Secondary | ICD-10-CM | POA: Diagnosis not present

## 2016-08-09 MED ORDER — BUPROPION HCL ER (XL) 150 MG PO TB24: 150.0000 mg | ORAL_TABLET | Freq: Two times a day (BID) | ORAL | 3 refills | Status: DC

## 2016-08-09 MED ORDER — TRAZODONE HCL 100 MG PO TABS: 100.0000 mg | ORAL_TABLET | Freq: Every day | ORAL | 3 refills | Status: DC

## 2016-08-09 NOTE — Progress Notes (Signed)
Patient ID: [name], female   DOB: Nov 25, 1970, 46 y.o.   MRN: [id number] Patient ID: [name], female   DOB: [birth year], 46 y.o.   MRN: [id number] Patient ID: [name], female   DOB: 04-Jul-1970, 46 y.o.   MRN: [id number] Patient ID: [name], female   DOB: 1970/11/05, 46 y.o.   MRN: [id number] Patient ID: [name], female   DOB: 01/21/1971, 46 y.o.   MRN: [id number] Patient ID: [name], female   DOB: 05-Sep-1970, 46 y.o.   MRN: [id number] Patient ID: [name], female   DOB: 1970-10-05, 46 y.o.   MRN: [id number] Patient ID: [name], female   DOB: September 06, 1970, 46 y.o.   MRN: [id number] Patient ID: [name], female   DOB: 10-Nov-1970, 46 y.o.   MRN: [id number] Patient ID: [name], female   DOB: 1970-07-31, 46 y.o.   MRN: [id number]  [place] [id number] Progress Note  TEVA BRONKEMA [id number] 46 y.o.  08/09/2016 1:43 PM  Chief Complaint: "I've been doing ok"  History of Present Illness:  This patient is a 46 year old black female who is married and lives with her 2 children and husband in [place]. She is on disability for Crohn's disease.  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 She got severely constipated with a high fever . Once it was determined what was wrong she's had an up-and-down course with her Crohn's. Currently is under good control because she has figured out which foods make it worse. The patient got depressed after developing Crohn's and was hospitalized twice at behavioral health hospital in the past. this was complicated by the fact that she was abusing marijuana.  The patient returns after 4 months. 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 She does report that she's been more tearful and weepy lately. She notices that her Wellbutrin had been changed last month to a different generic and she doesn't think it's working as well. She is going to call the  pharmacy about changing it back but if this doesn't work we will need to change her to brand name only. She still functioning quite well and denies being suicidal so she will let me know if she wants to change it. Her Crohn's disease is "up and down" and it often makes her feel fatigued and joint pain but she "just tries to keep going." She is sleeping well    Suicidal Ideation: No Plan Formed: No Patient has means to carry out plan: No  Homicidal Ideation: No Plan Formed: No Patient has means to carry out plan: No  Review of Systems: Psychiatric: Agitation: No Hallucination: No Depressed Mood: No Insomnia:no Hypersomnia: No Altered Concentration: No Feels Worthless: No Grandiose Ideas: No Belief In Special Powers: No New/Increased Substance Abuse: No Compulsions: No  Neurologic: Headache: Yes Seizure: No Paresthesias: No  Social History: Patient lives with her husband and children.  She has one son and 2 daughter.  She has a high school education.  She likes reading listing music walking and fishing.  Medical history Patient has multiple medical issues.  She has asthma, Crohn's disease, acid reflux, headaches, anemia, sleep apnea and arthritis.  Her primary care physician is Dr. Moshe Cipro.  Her neurologist is Dr Wylie Hail and her gastroenterologist is Dr. Quincy Sheehan.   Alcohol and substance use history. Patient admitted using marijuana but claims to be sober since 2012. Outpatient Encounter Prescriptions as  of 08/09/2016  Medication Sig  . acyclovir (ZOVIRAX) 400 MG tablet TAKE 1 TABLET(400 MG) BY MOUTH THREE TIMES DAILY (Patient taking differently: TAKE 1 TABLET(400 MG) BY MOUTH THREE TIMES DAILY PRN)  . acetaminophen (TYLENOL) 500 MG tablet Take 1,000 mg by mouth every 6 (six) hours as needed for mild pain.  Marland Kitchen azelastine (ASTELIN) 0.1 % nasal spray Place 2 sprays into both nostrils 2 (two) times daily. Use in each nostril as directed  . benzonatate (TESSALON) 100 MG capsule Take 1  capsule (100 mg total) by mouth 2 (two) times daily as needed for cough.  Marland Kitchen buPROPion (WELLBUTRIN XL) 150 MG 24 hr tablet Take 1 tablet (150 mg total) by mouth 2 (two) times daily.  . calcium-vitamin D (OSCAL WITH D) 500-200 MG-UNIT tablet Take 1 tablet by mouth daily.  . cetirizine (ZYRTEC) 10 MG tablet Take 10 mg by mouth daily as needed for allergies.   . chlorpheniramine (CHLOR-TRIMETON) 4 MG tablet Take 4 mg by mouth 2 (two) times daily as needed for allergies.  . Cyanocobalamin (B-12) 1000 MCG CAPS Take 1 capsule by mouth daily.    . diphenhydrAMINE (BENADRYL) 25 mg capsule Take 25 mg by mouth daily as needed. For allergies   . fluticasone (FLONASE) 50 MCG/ACT nasal spray SHAKE LIQUID AND USE 2 SPRAYS IN EACH NOSTRIL EVERY DAY  . furosemide (LASIX) 20 MG tablet TAKE 1 TABLET(20 MG) BY MOUTH DAILY AS NEEDED FOR SWELLING  . HUMIRA PEN 40 MG/0.8ML PNKT INJECT THE CONTENTS OF 1 PEN (40 MG) UNDER THE SKIN EVERY 14 DAYS  . ibuprofen (ADVIL,MOTRIN) 800 MG tablet Take 1 tablet (800 mg total) by mouth every 8 (eight) hours as needed.  . iron polysaccharides (NIFEREX) 150 MG capsule Take 1 capsule (150 mg total) by mouth daily.  . Liniments (SALONPAS ARTHRITIS PAIN RELIEF) PADS Apply 1 each topically every 8 (eight) hours as needed (pain). pain   . montelukast (SINGULAIR) 10 MG tablet Take 1 tablet (10 mg total) by mouth at bedtime.  . Multiple Vitamin (MULTIVITAMIN WITH MINERALS) TABS tablet Take 1 tablet by mouth daily.  . Omega-3 Fatty Acids (FISH OIL) 1200 MG CAPS Take 1,200 mg by mouth daily.   Marland Kitchen omeprazole (PRILOSEC) 20 MG capsule TAKE ONE (1) CAPSULE BY MOUTH TWICE A DAY BEFORE A MEAL  . omeprazole (PRILOSEC) 20 MG capsule TAKE 1 CAPSULE BY MOUTH TWICE DAILY BEFORE A MEAL  . potassium chloride SA (K-DUR,KLOR-CON) 20 MEQ tablet TAKE 1 TABLET(20 MEQ) BY MOUTH TWICE DAILY  . PROAIR HFA 108 (90 Base) MCG/ACT inhaler INHALE 2 PUFFS BY MOUTH EVERY 6 HOURS AS NEEDED FOR WHEEZING  . Probiotic Product  (PROBIOTIC PO) Take 1 tablet by mouth daily.  Marland Kitchen PROCTOZONE-HC 2.5 % rectal cream USE RECTALLY TWICE DAILY FOR 7 DAYS (Patient taking differently: USE RECTALLY TWICE DAILY FOR 7 DAYS PRN)  . traZODone (DESYREL) 100 MG tablet Take 1 tablet (100 mg total) by mouth at bedtime.  . [DISCONTINUED] buPROPion (WELLBUTRIN XL) 150 MG 24 hr tablet Take 1 tablet (150 mg total) by mouth 2 (two) times daily.  . [DISCONTINUED] traZODone (DESYREL) 100 MG tablet Take 1 tablet (100 mg total) by mouth at bedtime.   No facility-administered encounter medications on file as of 08/09/2016.     Past Psychiatric History/Hospitalization(s): Patient has multiple psychiatric hospitalizations in the past.  Her last psychiatric hospitalization in 2005 but she also admitted hospitalization in 2008 which we do not have details.  She has history of taking  overdose on her medication .  She has history of depression and she was also diagnosed with bipolar disorder.  In the past she has taken Seroquel, Risperdal, Celexa, Lexapro, and Xanax.  She has seen psychologist in the past.  Anxiety: No Bipolar Disorder: Yes Depression: Yes Mania: Yes Psychosis: No Schizophrenia: No Personality Disorder: No Hospitalization for psychiatric illness: Yes History of Electroconvulsive Shock Therapy: No Prior Suicide Attempts: No  Physical Exam: Constitutional:  BP 97/61   Pulse 67   Ht 5' 8"  (1.727 m)   Wt 212 lb (96.2 kg)   SpO2 95%   BMI 32.23 kg/m   General Appearance: alert, oriented, no acute distress and well nourished  Musculoskeletal: Strength & Muscle Tone: within normal limits Gait & Station: normal Patient leans: N/A  Psychiatric: Speech (describe rate, volume, coherence, spontaneity, and abnormalities if any):  clear and coherent normal tone and volume.    Thought Process (describe rate, content, abstract reasoning, and computation):logical and goal-directed.    Associations: Coherent and Intact  Thoughts:  normal  Mental Status: Orientation: oriented to person, place and time/date Mood & Affect: Mood is good and affect is bright Attention Span & Concentration:  fair   Medical Decision Making (Choose Three): Established Problem, Stable/Improving (1), Review of Psycho-Social Stressors (1), Review or order clinical lab tests (1), Review and summation of old records (2), Review of Last Therapy Session (1) and Review of Medication Regimen & Side Effects (2)  Assessment: Axis I: Maj. depressive disorder, recurrent  Axis II: deferred  Axis III:  Patient Active Problem List   Diagnosis Date Noted  . Muscle cramps 05/27/2016  . Need for 23-polyvalent pneumococcal polysaccharide vaccine 05/18/2016  . Crohn's disease of ileum with complication ([place]) 03/09/7251  . Iron deficiency anemia 08/28/2013  . Obesity (BMI 30.0-34.9) 04/04/2012  . Bipolar disorder ([place]) 04/03/2012  . Hx of nicotine dependence 11/14/2011  . High risk for colon cancer 03/15/2011  . Leukocytosis 10/06/2010  . ECHOCARDIOGRAM, ABNORMAL 01/11/2008  . ANEMIA, B12 DEFICIENCY 12/29/2005  . Anxiety state 12/29/2005  . Depression, controlled 12/29/2005  . Allergic rhinitis 12/29/2005  . GERD 12/29/2005  . PEPTIC ULCER DISEASE 12/29/2005    Axis IV: mild to moderate  Axis V:  55-60   Plan:. Patient will continue Wellbutrin XL 150 mg twice a day for depression andtrazodone to 100 mg daily at bedtime to help with sleep.  I recommend to call us back if she is any question of  worsening of the symptom.  Otherwise I will see her in 4 months Time spent 15 minutes.  More than 50% of the time spent and psychoeducation, counseling and coordination of care.    Levonne Spiller, MD 08/09/2016

## 2016-08-11 ENCOUNTER — Other Ambulatory Visit: Payer: Self-pay | Admitting: Family Medicine

## 2016-08-17 ENCOUNTER — Other Ambulatory Visit: Payer: Self-pay | Admitting: Family Medicine

## 2016-08-22 ENCOUNTER — Other Ambulatory Visit: Payer: Self-pay | Admitting: Family Medicine

## 2016-09-08 ENCOUNTER — Encounter: Payer: Self-pay | Admitting: Internal Medicine

## 2016-09-09 ENCOUNTER — Other Ambulatory Visit: Payer: Self-pay | Admitting: Family Medicine

## 2016-09-09 DIAGNOSIS — Z1231 Encounter for screening mammogram for malignant neoplasm of breast: Secondary | ICD-10-CM

## 2016-09-16 ENCOUNTER — Other Ambulatory Visit: Payer: Self-pay | Admitting: Family Medicine

## 2016-09-21 ENCOUNTER — Ambulatory Visit (HOSPITAL_COMMUNITY): Payer: Self-pay

## 2016-09-22 ENCOUNTER — Ambulatory Visit (HOSPITAL_COMMUNITY): Payer: Self-pay

## 2016-09-23 ENCOUNTER — Ambulatory Visit (HOSPITAL_COMMUNITY)
Admission: RE | Admit: 2016-09-23 | Discharge: 2016-09-23 | Disposition: A | Discharge status: Home / Self Care | Payer: BLUE CROSS/BLUE SHIELD | Source: Ambulatory Visit | Attending: Family Medicine | Admitting: Family Medicine

## 2016-09-23 DIAGNOSIS — Z1231 Encounter for screening mammogram for malignant neoplasm of breast: Secondary | ICD-10-CM | POA: Diagnosis not present

## 2016-10-05 ENCOUNTER — Other Ambulatory Visit (HOSPITAL_COMMUNITY): Payer: Self-pay | Admitting: *Deleted

## 2016-10-05 DIAGNOSIS — D508 Other iron deficiency anemias: Secondary | ICD-10-CM

## 2016-10-05 DIAGNOSIS — D72829 Elevated white blood cell count, unspecified: Secondary | ICD-10-CM

## 2016-10-06 ENCOUNTER — Ambulatory Visit (HOSPITAL_COMMUNITY): Payer: Self-pay | Admitting: Oncology

## 2016-10-06 ENCOUNTER — Ambulatory Visit (HOSPITAL_COMMUNITY): Payer: Self-pay

## 2016-10-06 ENCOUNTER — Other Ambulatory Visit (HOSPITAL_COMMUNITY): Payer: Self-pay

## 2016-10-06 ENCOUNTER — Encounter (HOSPITAL_COMMUNITY): Payer: BLUE CROSS/BLUE SHIELD | Attending: Oncology

## 2016-10-06 DIAGNOSIS — D509 Iron deficiency anemia, unspecified: Secondary | ICD-10-CM | POA: Insufficient documentation

## 2016-10-06 DIAGNOSIS — G4730 Sleep apnea, unspecified: Secondary | ICD-10-CM | POA: Insufficient documentation

## 2016-10-06 DIAGNOSIS — K5090 Crohn's disease, unspecified, without complications: Secondary | ICD-10-CM | POA: Diagnosis not present

## 2016-10-06 DIAGNOSIS — K219 Gastro-esophageal reflux disease without esophagitis: Secondary | ICD-10-CM | POA: Insufficient documentation

## 2016-10-06 DIAGNOSIS — Z87891 Personal history of nicotine dependence: Secondary | ICD-10-CM | POA: Diagnosis not present

## 2016-10-06 DIAGNOSIS — R5383 Other fatigue: Secondary | ICD-10-CM | POA: Insufficient documentation

## 2016-10-06 DIAGNOSIS — M2550 Pain in unspecified joint: Secondary | ICD-10-CM | POA: Diagnosis not present

## 2016-10-06 DIAGNOSIS — D72829 Elevated white blood cell count, unspecified: Secondary | ICD-10-CM

## 2016-10-06 DIAGNOSIS — Z8711 Personal history of peptic ulcer disease: Secondary | ICD-10-CM | POA: Diagnosis not present

## 2016-10-06 DIAGNOSIS — G8929 Other chronic pain: Secondary | ICD-10-CM | POA: Insufficient documentation

## 2016-10-06 DIAGNOSIS — D519 Vitamin B12 deficiency anemia, unspecified: Secondary | ICD-10-CM | POA: Insufficient documentation

## 2016-10-06 DIAGNOSIS — F319 Bipolar disorder, unspecified: Secondary | ICD-10-CM | POA: Insufficient documentation

## 2016-10-06 DIAGNOSIS — E876 Hypokalemia: Secondary | ICD-10-CM | POA: Insufficient documentation

## 2016-10-06 DIAGNOSIS — D508 Other iron deficiency anemias: Secondary | ICD-10-CM

## 2016-10-06 DIAGNOSIS — F419 Anxiety disorder, unspecified: Secondary | ICD-10-CM | POA: Diagnosis not present

## 2016-10-06 DIAGNOSIS — J45909 Unspecified asthma, uncomplicated: Secondary | ICD-10-CM | POA: Insufficient documentation

## 2016-10-06 LAB — CBC WITH DIFFERENTIAL/PLATELET
BASOS ABS: 0.1 10*3/uL (ref 0.0–0.1)
BASOS PCT: 1 %
EOS ABS: 0.3 10*3/uL (ref 0.0–0.7)
Eosinophils Relative: 2 %
HCT: 34.8 % — ABNORMAL LOW (ref 36.0–46.0)
Hemoglobin: 11.7 g/dL — ABNORMAL LOW (ref 12.0–15.0)
Lymphocytes Relative: 51 %
Lymphs Abs: 6.4 10*3/uL — ABNORMAL HIGH (ref 0.7–4.0)
MCH: 28 pg (ref 26.0–34.0)
MCHC: 33.6 g/dL (ref 30.0–36.0)
MCV: 83.3 fL (ref 78.0–100.0)
MONO ABS: 0.6 10*3/uL (ref 0.1–1.0)
Monocytes Relative: 5 %
NEUTROS PCT: 41 %
Neutro Abs: 5.2 10*3/uL (ref 1.7–7.7)
PLATELETS: 397 10*3/uL (ref 150–400)
RBC: 4.18 MIL/uL (ref 3.87–5.11)
RDW: 15.6 % — ABNORMAL HIGH (ref 11.5–15.5)
WBC: 12.6 10*3/uL — AB (ref 4.0–10.5)

## 2016-10-06 LAB — IRON AND TIBC
Iron: 59 ug/dL (ref 28–170)
Saturation Ratios: 20 % (ref 10.4–31.8)
TIBC: 297 ug/dL (ref 250–450)
UIBC: 238 ug/dL

## 2016-10-06 LAB — FERRITIN: Ferritin: 96 ng/mL (ref 11–307)

## 2016-10-14 ENCOUNTER — Other Ambulatory Visit: Payer: Self-pay | Admitting: Family Medicine

## 2016-10-19 ENCOUNTER — Ambulatory Visit: Payer: Self-pay

## 2016-10-28 ENCOUNTER — Encounter (HOSPITAL_BASED_OUTPATIENT_CLINIC_OR_DEPARTMENT_OTHER): Payer: BLUE CROSS/BLUE SHIELD | Admitting: Oncology

## 2016-10-28 ENCOUNTER — Encounter (HOSPITAL_COMMUNITY): Payer: Self-pay

## 2016-10-28 VITALS — BP 126/71 | HR 74 | Temp 98.6°F | Resp 16 | Wt 204.5 lb

## 2016-10-28 DIAGNOSIS — D509 Iron deficiency anemia, unspecified: Secondary | ICD-10-CM

## 2016-10-28 DIAGNOSIS — K5010 Crohn's disease of large intestine without complications: Secondary | ICD-10-CM

## 2016-10-28 DIAGNOSIS — D508 Other iron deficiency anemias: Secondary | ICD-10-CM
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
## 2016-10-28 NOTE — Patient Instructions (Signed)
[facility] at [facility] Discharge Instructions  RECOMMENDATIONS MADE BY THE CONSULTANT AND ANY TEST RESULTS WILL BE SENT TO YOUR REFERRING PHYSICIAN.  You saw Dr. Zhou today.  Thank you for choosing [facility] at [facility] to provide your oncology and hematology care.  To afford each patient quality time with our provider, please arrive at least 15 minutes before your scheduled appointment time.    If you have a lab appointment with the Cancer Center please come in thru the  Main Entrance and check in at the main information desk  You need to re-schedule your appointment should you arrive 10 or more minutes late.  We strive to give you quality time with our providers, and arriving late affects you and other patients whose appointments are after yours.  Also, if you no show three or more times for appointments you may be dismissed from the clinic at the providers discretion.     Again, thank you for choosing [facility].  Our hope is that these requests will decrease the amount of time that you wait before being seen by our physicians.       _____________________________________________________________  Should you have questions after your visit to [facility], please contact our office at [phone] between the hours of 8:30 a.m. and 4:30 p.m.  Voicemails left after 4:30 p.m. will not be returned until the following business day.  For prescription refill requests, have your pharmacy contact our office.       Resources For Cancer Patients and their Caregivers ? American Cancer Society: Can assist with transportation, wigs, general needs, runs Look Good Feel Better.        1-888-227-6333 ? Cancer Care: Provides financial assistance, online support groups, medication/co-pay assistance.  1-800-813-HOPE (4673) ? [facility] Assists [place] cancer patients and their families through  emotional , educational and financial support.  336-427-4357 ? [place] DSS Where to apply for food stamps, Medicaid and utility assistance. 336-342-1394 ? [facility]: Transportation to medical appointments. 336-347-2287 ? Social Security Administration: May apply for disability if have a Stage IV cancer. 336-342-7796 1-800-772-1213 ? [place] Aging, Disability and Transit Services: Assists with nutrition, care and transit needs. 336-349-2343  Cancer Center Support Programs: @10RELATIVEDAYS@ > Cancer Support Group  2nd Tuesday of the month 1pm-2pm, Journey Room  > Creative Journey  3rd Tuesday of the month 1130am-1pm, Journey Room  > Look Good Feel Better  1st Wednesday of the month 10am-12 noon, Journey Room (Call American Cancer Society to register 1-800-395-5775)    

## 2016-11-17 DIAGNOSIS — D509 Iron deficiency anemia, unspecified: Secondary | ICD-10-CM | POA: Diagnosis not present

## 2016-11-17 DIAGNOSIS — D72829 Elevated white blood cell count, unspecified: Secondary | ICD-10-CM | POA: Diagnosis not present

## 2016-11-17 LAB — LIPID PANEL
CHOLESTEROL: 125 mg/dL (ref <200)
HDL: 54 mg/dL (ref >50)
LDL Cholesterol (Calc): 51 mg/dL (calc)
Non-HDL Cholesterol (Calc): 71 mg/dL (calc) (ref <130)
Total CHOL/HDL Ratio: 2.3 (calc) (ref <5.0)
Triglycerides: 117 mg/dL (ref <150)

## 2016-11-17 LAB — COMPLETE METABOLIC PANEL WITH GFR
AG Ratio: 0.9 (calc) — ABNORMAL LOW (ref 1.0–2.5)
ALBUMIN MSPROF: 3.3 g/dL — AB (ref 3.6–5.1)
ALT: 11 U/L (ref 6–29)
AST: 17 U/L (ref 10–35)
Alkaline phosphatase (APISO): 58 U/L (ref 33–115)
BUN / CREAT RATIO: 7 (calc) (ref 6–22)
BUN: 6 mg/dL — AB (ref 7–25)
CALCIUM: 8.5 mg/dL — AB (ref 8.6–10.2)
CO2: 25 mmol/L (ref 20–32)
Chloride: 105 mmol/L (ref 98–110)
Creat: 0.81 mg/dL (ref 0.50–1.10)
GFR, EST NON AFRICAN AMERICAN: 87 mL/min/{1.73_m2} (ref >=60)
GFR, Est African American: 101 mL/min/{1.73_m2} (ref >=60)
GLOBULIN: 3.6 g/dL (ref 1.9–3.7)
GLUCOSE: 83 mg/dL (ref 65–99)
Potassium: 4.2 mmol/L (ref 3.5–5.3)
SODIUM: 138 mmol/L (ref 135–146)
TOTAL PROTEIN: 6.9 g/dL (ref 6.1–8.1)
Total Bilirubin: 0.3 mg/dL (ref 0.2–1.2)

## 2016-11-17 LAB — CBC
HEMATOCRIT: 34.5 % — AB (ref 35.0–45.0)
HEMOGLOBIN: 11.3 g/dL — AB (ref 11.7–15.5)
MCH: 27.6 pg (ref 27.0–33.0)
MCHC: 32.8 g/dL (ref 32.0–36.0)
MCV: 84.4 fL (ref 80.0–100.0)
MPV: 9.7 fL (ref 7.5–12.5)
Platelets: 400 10*3/uL (ref 140–400)
RBC: 4.09 10*6/uL (ref 3.80–5.10)
RDW: 14.5 % (ref 11.0–15.0)
WBC: 10.5 10*3/uL (ref 3.8–10.8)

## 2016-11-21 ENCOUNTER — Ambulatory Visit: Payer: BLUE CROSS/BLUE SHIELD | Admitting: Gastroenterology

## 2016-11-21 ENCOUNTER — Encounter: Payer: Self-pay | Admitting: Family Medicine

## 2016-11-21 ENCOUNTER — Ambulatory Visit (INDEPENDENT_AMBULATORY_CARE_PROVIDER_SITE_OTHER): Payer: BLUE CROSS/BLUE SHIELD | Admitting: Family Medicine

## 2016-11-21 VITALS — BP 115/70 | HR 77 | Temp 98.0°F | Resp 16 | Ht 68.0 in | Wt 204.8 lb

## 2016-11-21 DIAGNOSIS — M62838 Other muscle spasm: Secondary | ICD-10-CM | POA: Diagnosis not present

## 2016-11-21 DIAGNOSIS — M542 Cervicalgia: Secondary | ICD-10-CM

## 2016-11-21 DIAGNOSIS — Z23 Encounter for immunization: Secondary | ICD-10-CM | POA: Diagnosis not present

## 2016-11-21 DIAGNOSIS — Z0000 Encounter for general adult medical examination without abnormal findings: Secondary | ICD-10-CM | POA: Diagnosis not present

## 2016-11-21 DIAGNOSIS — Z1211 Encounter for screening for malignant neoplasm of colon: Secondary | ICD-10-CM

## 2016-11-21 MED ORDER — CYCLOBENZAPRINE HCL 10 MG PO TABS: 10.0000 mg | ORAL_TABLET | Freq: Every day | ORAL | 0 refills | Status: DC

## 2016-11-21 NOTE — Assessment & Plan Note (Signed)

## 2016-11-21 NOTE — Assessment & Plan Note (Signed)
2 month h/o left neck pain radiaiting to left arm rated at a 10, with spasm refer to pT

## 2016-11-21 NOTE — Patient Instructions (Signed)
F/u in 6 month, call if you need me vb nefoore  Flu vaccine today  Flexeril for bedtime use and you are referred to PT twice weekly for 4 weeks for left neck pain and s-pasm, you may als use Es tylenol twice daily for pain  It is important that you exercise regularly at least 30 minutes 5 times a week. If you develop chest pain, have severe difficulty breathing, or feel very tired, stop exercising immediately and seek medical attention   Please work on good  health habits so that your health will improve. 1. Commitment to daily physical activity for 30 to 60  minutes, if you are able to do this.  2. Commitment to wise food choices. Aim for half of your  food intake to be vegetable and fruit, one quarter starchy foods, and one quarter protein. Try to eat on a regular schedule  3 meals per day, snacking between meals should be limited to vegetables or fruits or small portions of nuts. 64 ounces of water per day is generally recommended, unless you have specific health conditions, like heart failure or kidney failure where you will need to limit fluid intake.  3. Commitment to sufficient and a  good quality of physical and mental rest daily, generally between 6 to 8 hours per day.  WITH PERSISTANCE AND PERSEVERANCE, THE IMPOSSIBLE , BECOMES THE NORM!  Thank you  for choosing [facility]. We consider it a privelige to serve you.  Delivering excellent health care in a caring and  compassionate way is our goal.  Partnering with you,  so that together we can achieve this goal is our strategy.

## 2016-11-23 NOTE — Progress Notes (Signed)
    [name]     MRN: [id number]      DOB: 1970/03/09  HPI: Patient is in for annual physical exam. Recent labs, iare reviewed. Immunization is reviewed , and  Updated C/o left neck pain and spasm , this awakens her, requests help   PE: BP 115/70 (BP Location: Right Arm, Patient Position: Sitting, Cuff Size: Normal)   Pulse 77   Temp 98 F (36.7 C) (Other (Comment))   Resp 16   Ht 5' 8"  (1.727 m)   Wt 204 lb 12 oz (92.9 kg)   LMP 11/17/2016   SpO2 98%   BMI 31.13 kg/m   Pleasant  female, alert and oriented x 3, in no cardio-pulmonary distress. Afebrile. HEENT No facial trauma or asymetry. Sinuses non tender.  Extra occullar muscles intact,  External ears normal, tympanic membranes clear. Oropharynx moist, no exudate. Neck: decreased ROM with palpable left trapezius spasm, no adenopathy,JVD or thyromegaly.No bruits.  Chest: Clear to ascultation bilaterally.No crackles or wheezes. Non tender to palpation  Breast: No asymetry,no masses or lumps. No tenderness. No nipple discharge or inversion. No axillary or supraclavicular adenopathy  Cardiovascular system; Heart sounds normal,  S1 and  S2 ,no S3.  No murmur, or thrill. Apical beat not displaced Peripheral pulses normal.  Abdomen: Soft, non tender, no organomegaly or masses. No bruits. Bowel sounds normal. No guarding, tenderness or rebound.  Rectal:  Normal sphincter tone. No rectal mass. Guaiac negative stool.  GU: Not examined   Musculoskeletal exam: Full ROM of spine, hips , shoulders and knees. No deformity ,swelling or crepitus noted. No muscle wasting or atrophy.   Neurologic: Cranial nerves 2 to 12 intact. Power, tone ,sensation and reflexes normal throughout. No disturbance in gait. No tremor.  Skin: Intact, no ulceration, erythema , scaling or rash noted. Pigmentation normal throughout  Psych; Normal mood and affect. Judgement and concentration normal   Assessment & Plan:    Annual physical exam Annual exam as documented. Counseling done  re healthy lifestyle involving commitment to 150 minutes exercise per week, heart healthy diet, and attaining healthy weight.The importance of adequate sleep also discussed. Regular seat belt use and home safety, is also discussed. Changes in health habits are decided on by the patient with goals and time frames  set for achieving them. Immunization and cancer screening needs are specifically addressed at this visit.   Neck pain on left side 2 month h/o left neck pain radiaiting to left arm rated at a 10, with spasm refer to pT

## 2016-11-25 ENCOUNTER — Other Ambulatory Visit: Payer: Self-pay | Admitting: Family Medicine

## 2016-11-25 LAB — HEMOCCULT GUIAC POC 1CARD (OFFICE): Fecal Occult Blood, POC: NEGATIVE

## 2016-11-25 NOTE — Telephone Encounter (Signed)
Seen [phone]

## 2016-11-29 ENCOUNTER — Other Ambulatory Visit: Payer: Self-pay

## 2016-12-04 MED ORDER — ADALIMUMAB 40 MG/0.8ML ~~LOC~~ AJKT: 40.0000 mg | AUTO-INJECTOR | SUBCUTANEOUS | 3 refills | Status: DC

## 2016-12-05 ENCOUNTER — Ambulatory Visit (HOSPITAL_COMMUNITY): Payer: BLUE CROSS/BLUE SHIELD | Admitting: Psychiatry

## 2016-12-05 ENCOUNTER — Telehealth (HOSPITAL_COMMUNITY): Payer: Self-pay | Admitting: *Deleted

## 2016-12-05 NOTE — Telephone Encounter (Signed)
left voice message, provider out of office.

## 2016-12-08 ENCOUNTER — Ambulatory Visit (HOSPITAL_COMMUNITY): Payer: BLUE CROSS/BLUE SHIELD | Attending: Family Medicine | Admitting: Physical Therapy

## 2016-12-08 DIAGNOSIS — M5412 Radiculopathy, cervical region: Secondary | ICD-10-CM | POA: Diagnosis not present

## 2016-12-08 NOTE — Patient Instructions (Addendum)
Scapular Retraction (Standing)    With arms at sides, pinch shoulder blades down and together. Repeat __10__ times per set. Do __1__ sets per session. Do _3___ sessions per day. 1 http://orth.exer.us/944   Copyright  VHI. All rights reserved.  Strengthening: Lateral Bend - Isometric (in Neutral)    Using light pressure from palm of hand, press into right temple. Resist bending head sideways. Hold _5___ seconds. Repeat _5-10___ times per set. Do _1___ sets per session. Do __2__ sessions per day. Repeat to the left  http://orth.exer.us/302   Copyright  VHI. All rights reserved.    Sit very tall. Now press your heels into the floor as you try and raise the crown of your head to the ceiling. Hold for 5-10 seconds and relax.
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
## 2016-12-15 ENCOUNTER — Ambulatory Visit (HOSPITAL_COMMUNITY): Payer: BLUE CROSS/BLUE SHIELD

## 2016-12-20 ENCOUNTER — Ambulatory Visit (HOSPITAL_COMMUNITY): Payer: BLUE CROSS/BLUE SHIELD | Admitting: Physical Therapy

## 2016-12-20 DIAGNOSIS — M5412 Radiculopathy, cervical region: Secondary | ICD-10-CM
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
## 2016-12-20 NOTE — Patient Instructions (Addendum)
Flexibility: Neck Stretch    Grasp left arm above wrist and pull down across body while gently tilting head same direction. Hold _15___ seconds. Relax. Repeat _3___ times per set. Do _2___ sets per session. Do ___2_ sessions per day. 3 http://orth.exer.us/346   Copyright  VHI. All rights reserved.  Strengthening: Shoulder Shrug (Phase 1)    Shrug shoulders up backward now relax Repeat __10__ times per set. Do __10__ sets per session. Do __2__ sessions per day.  http://orth.exer.us/336   Copyright  VHI. All rights reserved.  Scapular Retraction: Abduction (Standing)    With arms elevated and elbows bent to 90, pinch shoulder blades together and press arms back. Repeat ___10_ times per set. Do ___1_ sets per session. Do __2__ sessions per day.  http://orth.exer.us/950   Copyright  VHI. All rights reserved.

## 2016-12-22 ENCOUNTER — Ambulatory Visit (HOSPITAL_COMMUNITY): Payer: BLUE CROSS/BLUE SHIELD | Admitting: Physical Therapy

## 2016-12-22 DIAGNOSIS — M5412 Radiculopathy, cervical region: Secondary | ICD-10-CM
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
## 2016-12-26 ENCOUNTER — Telehealth (HOSPITAL_COMMUNITY): Payer: Self-pay | Admitting: Physical Therapy

## 2016-12-26 NOTE — Telephone Encounter (Signed)
She has to pick up her daughter from [facility] tomorrow

## 2016-12-27 ENCOUNTER — Ambulatory Visit (HOSPITAL_COMMUNITY): Payer: BLUE CROSS/BLUE SHIELD | Admitting: Physical Therapy

## 2016-12-28 ENCOUNTER — Ambulatory Visit (INDEPENDENT_AMBULATORY_CARE_PROVIDER_SITE_OTHER): Payer: BLUE CROSS/BLUE SHIELD | Admitting: Gastroenterology

## 2016-12-28 ENCOUNTER — Other Ambulatory Visit: Payer: Self-pay

## 2016-12-28 ENCOUNTER — Telehealth: Payer: Self-pay

## 2016-12-28 ENCOUNTER — Encounter: Payer: Self-pay | Admitting: Gastroenterology

## 2016-12-28 VITALS — BP 110/71 | HR 89 | Temp 98.8°F | Ht 68.0 in | Wt 204.4 lb

## 2016-12-28 DIAGNOSIS — Z9189 Other specified personal risk factors, not elsewhere classified: Secondary | ICD-10-CM

## 2016-12-28 DIAGNOSIS — K50019 Crohn's disease of small intestine with unspecified complications: Secondary | ICD-10-CM

## 2016-12-28 DIAGNOSIS — K5080 Crohn's disease of both small and large intestine without complications: Secondary | ICD-10-CM | POA: Insufficient documentation

## 2016-12-28 DIAGNOSIS — Z1211 Encounter for screening for malignant neoplasm of colon: Secondary | ICD-10-CM

## 2016-12-28 MED ORDER — OMEPRAZOLE 20 MG PO CPDR: DELAYED_RELEASE_CAPSULE | ORAL | 3 refills | Status: DC

## 2016-12-28 MED ORDER — PEG 3350-KCL-NA BICARB-NACL 420 G PO SOLR: 4000.0000 mL | ORAL | 0 refills | Status: DC

## 2016-12-28 NOTE — Patient Instructions (Signed)
1. Colonoscopy as scheduled.  Please see separate instructions. 2. Samples of Linzess provided today.  I would like for you to take 1 pill on an empty stomach for 3 days prior to your bowel prep.  Hopefully this will and sure you have an poor bowel prep for procedure.
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
## 2016-12-28 NOTE — Telephone Encounter (Signed)
Tried to call pt to inform of pre-op appt 02/06/17 at 10:00am, LMOVM. Letter mailed.

## 2016-12-28 NOTE — Assessment & Plan Note (Addendum)
46 year old female with history of ileocolonic Crohn's disease presenting for follow-up.  Chronically has been doing very well on Humira.  She has iron deficiency anemia followed by hematology, has received iron infusions in the past.  Attempted high risk screening colonoscopy back in January unsuccessful due to inadequate bowel prep.  She would like to go colonoscopy at this time.  Plan for 2 days clear liquid.  Optimize bowel function receiving bowel prep with use of Linzess.  Patient reports last time she took try lightly but did not use bisacodyl but use stool softener instead.  Educated patient on bowel preparation.  Plan for colonoscopy in the near future. I have discussed the risks, alternatives, benefits with regards to but not limited to the risk of reaction to medication, bleeding, infection, perforation and the patient is agreeable to proceed. Written consent to be obtained.  Continue Humira. Continue omeprazole for GERD.

## 2016-12-29 ENCOUNTER — Ambulatory Visit (HOSPITAL_COMMUNITY): Payer: BLUE CROSS/BLUE SHIELD | Admitting: Physical Therapy

## 2016-12-29 DIAGNOSIS — M5412 Radiculopathy, cervical region: Secondary | ICD-10-CM | POA: Diagnosis not present
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
## 2017-01-03 ENCOUNTER — Ambulatory Visit (HOSPITAL_COMMUNITY): Payer: BLUE CROSS/BLUE SHIELD | Admitting: Physical Therapy

## 2017-01-03 DIAGNOSIS — M5412 Radiculopathy, cervical region: Secondary | ICD-10-CM
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
## 2017-01-05 ENCOUNTER — Encounter (HOSPITAL_COMMUNITY): Payer: Self-pay

## 2017-01-05 ENCOUNTER — Ambulatory Visit (HOSPITAL_COMMUNITY): Payer: BLUE CROSS/BLUE SHIELD | Attending: Family Medicine

## 2017-01-05 DIAGNOSIS — M542 Cervicalgia: Secondary | ICD-10-CM | POA: Insufficient documentation

## 2017-01-05 DIAGNOSIS — M5412 Radiculopathy, cervical region: Secondary | ICD-10-CM | POA: Diagnosis not present

## 2017-01-05 DIAGNOSIS — M62838 Other muscle spasm: Secondary | ICD-10-CM | POA: Insufficient documentation
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
## 2017-01-05 NOTE — Patient Instructions (Signed)
  ELASTIC BAND SCAPULAR RETRACTIONS WITH MINI SHOULDER EXTENSIONS  While holding an elastic band with both arms in front of you with your elbows straight, squeeze your shoulder blades together as you pull the band back. Be sure your shoulders do not raise up.    Progress to blue band   PNF D2 Extension  Start with arm down and across your body, elbow bent, thumb at opposite hip. Bring arm up and out, rotating arm so thumb is pointed towards the ceiling. Return to start.   Use green band to start.   PNF D2 Flexion  Start with arm up and out to the side, palm facing away from you (thumb pointed towards the ceiling). Bring arm down and across your body, elbow bent, rotating arms as you go so thumb ends at opposite hip. Return to start.  Use green band to start.     Perform 1-2x/day, 2-3 sets of 10-15 reps.

## 2017-01-10 ENCOUNTER — Other Ambulatory Visit: Payer: Self-pay | Admitting: Family Medicine

## 2017-01-10 ENCOUNTER — Ambulatory Visit (HOSPITAL_COMMUNITY): Payer: BLUE CROSS/BLUE SHIELD

## 2017-01-10 DIAGNOSIS — Z7951 Long term (current) use of inhaled steroids: Secondary | ICD-10-CM | POA: Diagnosis not present

## 2017-01-10 DIAGNOSIS — R69 Illness, unspecified: Secondary | ICD-10-CM | POA: Diagnosis not present

## 2017-01-10 DIAGNOSIS — M542 Cervicalgia: Secondary | ICD-10-CM | POA: Diagnosis not present

## 2017-01-10 DIAGNOSIS — Z0000 Encounter for general adult medical examination without abnormal findings: Secondary | ICD-10-CM | POA: Diagnosis not present

## 2017-01-10 DIAGNOSIS — K5010 Crohn's disease of large intestine without complications: Secondary | ICD-10-CM | POA: Diagnosis not present

## 2017-01-10 DIAGNOSIS — K219 Gastro-esophageal reflux disease without esophagitis: Secondary | ICD-10-CM | POA: Diagnosis not present

## 2017-01-10 DIAGNOSIS — M5412 Radiculopathy, cervical region: Secondary | ICD-10-CM | POA: Diagnosis not present

## 2017-01-10 DIAGNOSIS — H18413 Arcus senilis, bilateral: Secondary | ICD-10-CM | POA: Diagnosis not present

## 2017-01-10 DIAGNOSIS — J4530 Mild persistent asthma, uncomplicated: Secondary | ICD-10-CM | POA: Diagnosis not present

## 2017-01-10 DIAGNOSIS — Z87891 Personal history of nicotine dependence: Secondary | ICD-10-CM | POA: Diagnosis not present

## 2017-01-10 DIAGNOSIS — H02402 Unspecified ptosis of left eyelid: Secondary | ICD-10-CM | POA: Diagnosis not present

## 2017-01-10 DIAGNOSIS — M62838 Other muscle spasm: Secondary | ICD-10-CM | POA: Diagnosis not present
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
## 2017-01-11 ENCOUNTER — Other Ambulatory Visit: Payer: Self-pay | Admitting: Family Medicine

## 2017-01-12 ENCOUNTER — Ambulatory Visit (HOSPITAL_COMMUNITY): Payer: BLUE CROSS/BLUE SHIELD | Admitting: Physical Therapy

## 2017-01-12 ENCOUNTER — Telehealth (HOSPITAL_COMMUNITY): Payer: Self-pay | Admitting: Family Medicine

## 2017-01-12 NOTE — Telephone Encounter (Signed)
01/12/17  she left a message to cx saying she couldn't make it today but does want to reschedule

## 2017-01-16 ENCOUNTER — Other Ambulatory Visit: Payer: Self-pay | Admitting: Family Medicine

## 2017-01-18 ENCOUNTER — Ambulatory Visit (HOSPITAL_COMMUNITY): Payer: BLUE CROSS/BLUE SHIELD

## 2017-01-18 ENCOUNTER — Telehealth (HOSPITAL_COMMUNITY): Payer: Self-pay | Admitting: Family Medicine

## 2017-01-18 NOTE — Telephone Encounter (Signed)
01/18/17  Pt cx but no reason was given

## 2017-01-20 ENCOUNTER — Telehealth (HOSPITAL_COMMUNITY): Payer: Self-pay | Admitting: Family Medicine

## 2017-01-20 ENCOUNTER — Other Ambulatory Visit: Payer: Self-pay | Admitting: Family Medicine

## 2017-01-20 ENCOUNTER — Ambulatory Visit (HOSPITAL_COMMUNITY): Payer: BLUE CROSS/BLUE SHIELD

## 2017-01-20 NOTE — Telephone Encounter (Signed)
01/20/17  pt left a message to cx said she couldn't make today

## 2017-01-23 ENCOUNTER — Encounter (HOSPITAL_COMMUNITY): Payer: Self-pay

## 2017-01-23 ENCOUNTER — Ambulatory Visit (HOSPITAL_COMMUNITY): Payer: BLUE CROSS/BLUE SHIELD

## 2017-01-23 DIAGNOSIS — M5412 Radiculopathy, cervical region: Secondary | ICD-10-CM

## 2017-01-23 DIAGNOSIS — M542 Cervicalgia: Secondary | ICD-10-CM | POA: Diagnosis not present

## 2017-01-23 DIAGNOSIS — M62838 Other muscle spasm: Secondary | ICD-10-CM
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
## 2017-01-24 ENCOUNTER — Other Ambulatory Visit: Payer: Self-pay | Admitting: Family Medicine

## 2017-01-25 ENCOUNTER — Ambulatory Visit (HOSPITAL_COMMUNITY): Payer: BLUE CROSS/BLUE SHIELD

## 2017-01-25 ENCOUNTER — Telehealth (HOSPITAL_COMMUNITY): Payer: Self-pay

## 2017-01-25 NOTE — Telephone Encounter (Signed)
Pt has to pick her child up from school and can not get here on time.

## 2017-01-25 NOTE — Addendum Note (Signed)
Addended by: Geralyn Corwin on: 01/25/2017 08:01 AM   Modules accepted: Orders

## 2017-01-31 ENCOUNTER — Telehealth (HOSPITAL_COMMUNITY): Payer: Self-pay

## 2017-01-31 ENCOUNTER — Ambulatory Visit (HOSPITAL_COMMUNITY): Payer: BLUE CROSS/BLUE SHIELD

## 2017-01-31 NOTE — Telephone Encounter (Signed)
No show #1; Called re missed appointment, no answer and mailbox full so unable to leave message.   Geraldine Solar PT, DPT

## 2017-01-31 NOTE — Telephone Encounter (Signed)
Pt returned phone call and stated she forgot about her appointment at 2:30. PT reminded her of her next scheduled visit.   Geraldine Solar PT, DPT

## 2017-02-02 ENCOUNTER — Ambulatory Visit (HOSPITAL_COMMUNITY): Payer: BLUE CROSS/BLUE SHIELD

## 2017-02-02 ENCOUNTER — Encounter (HOSPITAL_COMMUNITY): Payer: Self-pay

## 2017-02-02 DIAGNOSIS — M62838 Other muscle spasm: Secondary | ICD-10-CM | POA: Diagnosis not present

## 2017-02-02 DIAGNOSIS — M542 Cervicalgia: Secondary | ICD-10-CM | POA: Diagnosis not present

## 2017-02-02 DIAGNOSIS — M5412 Radiculopathy, cervical region: Secondary | ICD-10-CM

## 2017-02-02 NOTE — Patient Instructions (Signed)
[name]  02/02/2017     @PREFPERIOPPHARMACY @   Your procedure is scheduled on  02/13/2017 .  Report to [facility] at  830  A.M.  Call this number if you have problems the morning of surgery:  [phone]   Remember:  Do not eat food or drink liquids after midnight.  Take these medicines the morning of surgery with A SIP OF WATER  Wellbutrin, zyrtec, flexaril, prilosec. Use your inhaler before you come and bring your rescue inhaler with you.   Do not wear jewelry, make-up or nail polish.  Do not wear lotions, powders, or perfumes, or deoderant.  Do not shave 48 hours prior to surgery.  Men may shave face and neck.  Do not bring valuables to the hospital.  [facility] is not responsible for any belongings or valuables.  Contacts, dentures or bridgework may not be worn into surgery.  Leave your suitcase in the car.  After surgery it may be brought to your room.  For patients admitted to the hospital, discharge time will be determined by your treatment team.  Patients discharged the day of surgery will not be allowed to drive home.   Name and phone number of your driver:   family Special instructions:  Follow the diet and prep instructions given to you by Dr Roseanne Kaufman office  Please read over the following fact sheets that you were given. Anesthesia Post-op Instructions and Care and Recovery After Surgery       Colonoscopy, Adult A colonoscopy is an exam to look at the entire large intestine. During the exam, a lubricated, bendable tube is inserted into the anus and then passed into the rectum, colon, and other parts of the large intestine. A colonoscopy is often done as a part of normal colorectal screening or in response to certain symptoms, such as anemia, persistent diarrhea, abdominal pain, and blood in the stool. The exam can help screen for and diagnose medical problems, including:  Tumors.  Polyps.  Inflammation.  Areas of bleeding.  Tell  a health care provider about:  Any allergies you have.  All medicines you are taking, including vitamins, herbs, eye drops, creams, and over-the-counter medicines.  Any problems you or family members have had with anesthetic medicines.  Any blood disorders you have.  Any surgeries you have had.  Any medical conditions you have.  Any problems you have had passing stool. What are the risks? Generally, this is a safe procedure. However, problems may occur, including:  Bleeding.  A tear in the intestine.  A reaction to medicines given during the exam.  Infection (rare).  What happens before the procedure? Eating and drinking restrictions Follow instructions from your health care provider about eating and drinking, which may include:  A few days before the procedure - follow a low-fiber diet. Avoid nuts, seeds, dried fruit, raw fruits, and vegetables.  1-3 days before the procedure - follow a clear liquid diet. Drink only clear liquids, such as clear broth or bouillon, black coffee or tea, clear juice, clear soft drinks or sports drinks, gelatin dessert, and popsicles. Avoid any liquids that contain red or purple dye.  On the day of the procedure - do not eat or drink anything during the 2 hours before the procedure, or within the time period that your health care provider recommends.  Bowel prep If you were prescribed an oral bowel prep to clean out your colon:  Take  it as told by your health care provider. Starting the day before your procedure, you will need to drink a large amount of medicated liquid. The liquid will cause you to have multiple loose stools until your stool is almost clear or light green.  If your skin or anus gets irritated from diarrhea, you may use these to relieve the irritation: ? Medicated wipes, such as adult wet wipes with aloe and vitamin E. ? A skin soothing-product like petroleum jelly.  If you vomit while drinking the bowel prep, take a break  for up to 60 minutes and then begin the bowel prep again. If vomiting continues and you cannot take the bowel prep without vomiting, call your health care provider.  General instructions  Ask your health care provider about changing or stopping your regular medicines. This is especially important if you are taking diabetes medicines or blood thinners.  Plan to have someone take you home from the hospital or clinic. What happens during the procedure?  An IV tube may be inserted into one of your veins.  You will be given medicine to help you relax (sedative).  To reduce your risk of infection: ? Your health care team will wash or sanitize their hands. ? Your anal area will be washed with soap.  You will be asked to lie on your side with your knees bent.  Your health care provider will lubricate a long, thin, flexible tube. The tube will have a camera and a light on the end.  The tube will be inserted into your anus.  The tube will be gently eased through your rectum and colon.  Air will be delivered into your colon to keep it open. You may feel some pressure or cramping.  The camera will be used to take images during the procedure.  A small tissue sample may be removed from your body to be examined under a microscope (biopsy). If any potential problems are found, the tissue will be sent to a lab for testing.  If small polyps are found, your health care provider may remove them and have them checked for cancer cells.  The tube that was inserted into your anus will be slowly removed. The procedure may vary among health care providers and hospitals. What happens after the procedure?  Your blood pressure, heart rate, breathing rate, and blood oxygen level will be monitored until the medicines you were given have worn off.  Do not drive for 24 hours after the exam.  You may have a small amount of blood in your stool.  You may pass gas and have mild abdominal cramping or bloating  due to the air that was used to inflate your colon during the exam.  It is up to you to get the results of your procedure. Ask your health care provider, or the department performing the procedure, when your results will be ready. This information is not intended to replace advice given to you by your health care provider. Make sure you discuss any questions you have with your health care provider. Document Released: 02/19/2000 Document Revised: 12/23/2015 Document Reviewed: 05/05/2015 Elsevier Interactive Patient Education  2018 [employer].  Colonoscopy, Adult, Care After This sheet gives you information about how to care for yourself after your procedure. Your health care provider may also give you more specific instructions. If you have problems or questions, contact your health care provider. What can I expect after the procedure? After the procedure, it is common to have:  A  small amount of blood in your stool for 24 hours after the procedure.  Some gas.  Mild abdominal cramping or bloating.  Follow these instructions at home: General instructions   For the first 24 hours after the procedure: ? Do not drive or use machinery. ? Do not sign important documents. ? Do not drink alcohol. ? Do your regular daily activities at a slower pace than normal. ? Eat soft, easy-to-digest foods. ? Rest often.  Take over-the-counter or prescription medicines only as told by your health care provider.  It is up to you to get the results of your procedure. Ask your health care provider, or the department performing the procedure, when your results will be ready. Relieving cramping and bloating  Try walking around when you have cramps or feel bloated.  Apply heat to your abdomen as told by your health care provider. Use a heat source that your health care provider recommends, such as a moist heat pack or a heating pad. ? Place a towel between your skin and the heat source. ? Leave the heat  on for 20-30 minutes. ? Remove the heat if your skin turns bright red. This is especially important if you are unable to feel pain, heat, or cold. You may have a greater risk of getting burned. Eating and drinking  Drink enough fluid to keep your urine clear or pale yellow.  Resume your normal diet as instructed by your health care provider. Avoid heavy or fried foods that are hard to digest.  Avoid drinking alcohol for as long as instructed by your health care provider. Contact a health care provider if:  You have blood in your stool 2-3 days after the procedure. Get help right away if:  You have more than a small spotting of blood in your stool.  You pass large blood clots in your stool.  Your abdomen is swollen.  You have nausea or vomiting.  You have a fever.  You have increasing abdominal pain that is not relieved with medicine. This information is not intended to replace advice given to you by your health care provider. Make sure you discuss any questions you have with your health care provider. Document Released: 10/06/2003 Document Revised: 11/16/2015 Document Reviewed: 05/05/2015 Elsevier Interactive Patient Education  2018 [place] Anesthesia, Adult General anesthesia is the use of medicines to make a person "go to sleep" (be unconscious) for a medical procedure. General anesthesia is often recommended when a procedure:  Is long.  Requires you to be still or in an unusual position.  Is major and can cause you to lose blood.  Is impossible to do without general anesthesia.  The medicines used for general anesthesia are called general anesthetics. In addition to making you sleep, the medicines:  Prevent pain.  Control your blood pressure.  Relax your muscles.  Tell a health care provider about:  Any allergies you have.  All medicines you are taking, including vitamins, herbs, eye drops, creams, and over-the-counter medicines.  Any problems  you or family members have had with anesthetic medicines.  Types of anesthetics you have had in the past.  Any bleeding disorders you have.  Any surgeries you have had.  Any medical conditions you have.  Any history of heart or lung conditions, such as heart failure, sleep apnea, or chronic obstructive pulmonary disease (COPD).  Whether you are pregnant or may be pregnant.  Whether you use tobacco, alcohol, marijuana, or street drugs.  Any history of [employer]  service.  Any history of depression or anxiety. What are the risks? Generally, this is a safe procedure. However, problems may occur, including:  Allergic reaction to anesthetics.  Lung and heart problems.  Inhaling food or liquids from your stomach into your lungs (aspiration).  Injury to nerves.  Waking up during your procedure and being unable to move (rare).  Extreme agitation or a state of mental confusion (delirium) when you wake up from the anesthetic.  Air in the bloodstream, which can lead to stroke.  These problems are more likely to develop if you are having a major surgery or if you have an advanced medical condition. You can prevent some of these complications by answering all of your health care provider's questions thoroughly and by following all pre-procedure instructions. General anesthesia can cause side effects, including:  Nausea or vomiting  A sore throat from the breathing tube.  Feeling cold or shivery.  Feeling tired, washed out, or achy.  Sleepiness or drowsiness.  Confusion or agitation.  What happens before the procedure? Staying hydrated Follow instructions from your health care provider about hydration, which may include:  Up to 2 hours before the procedure - you may continue to drink clear liquids, such as water, clear fruit juice, black coffee, and plain tea.  Eating and drinking restrictions Follow instructions from your health care provider about eating and drinking,  which may include:  8 hours before the procedure - stop eating heavy meals or foods such as meat, fried foods, or fatty foods.  6 hours before the procedure - stop eating light meals or foods, such as toast or cereal.  6 hours before the procedure - stop drinking milk or drinks that contain milk.  2 hours before the procedure - stop drinking clear liquids.  Medicines  Ask your health care provider about: ? Changing or stopping your regular medicines. This is especially important if you are taking diabetes medicines or blood thinners. ? Taking medicines such as aspirin and ibuprofen. These medicines can thin your blood. Do not take these medicines before your procedure if your health care provider instructs you not to. ? Taking new dietary supplements or medicines. Do not take these during the week before your procedure unless your health care provider approves them.  If you are told to take a medicine or to continue taking a medicine on the day of the procedure, take the medicine with sips of water. General instructions   Ask if you will be going home the same day, the following day, or after a longer hospital stay. ? Plan to have someone take you home. ? Plan to have someone stay with you for the first 24 hours after you leave the hospital or clinic.  For 3-6 weeks before the procedure, try not to use any tobacco products, such as cigarettes, chewing tobacco, and e-cigarettes.  You may brush your teeth on the morning of the procedure, but make sure to spit out the toothpaste. What happens during the procedure?  You will be given anesthetics through a mask and through an IV tube in one of your veins.  You may receive medicine to help you relax (sedative).  As soon as you are asleep, a breathing tube may be used to help you breathe.  An anesthesia specialist will stay with you throughout the procedure. He or she will help keep you comfortable and safe by continuing to give you  medicines and adjusting the amount of medicine that you get. He or she  will also watch your blood pressure, pulse, and oxygen levels to make sure that the anesthetics do not cause any problems.  If a breathing tube was used to help you breathe, it will be removed before you wake up. The procedure may vary among health care providers and hospitals. What happens after the procedure?  You will wake up, often slowly, after the procedure is complete, usually in a recovery area.  Your blood pressure, heart rate, breathing rate, and blood oxygen level will be monitored until the medicines you were given have worn off.  You may be given medicine to help you calm down if you feel anxious or agitated.  If you will be going home the same day, your health care provider may check to make sure you can stand, drink, and urinate.  Your health care providers will treat your pain and side effects before you go home.  Do not drive for 24 hours if you received a sedative.  You may: ? Feel nauseous and vomit. ? Have a sore throat. ? Have mental slowness. ? Feel cold or shivery. ? Feel sleepy. ? Feel tired. ? Feel sore or achy, even in parts of your body where you did not have surgery. This information is not intended to replace advice given to you by your health care provider. Make sure you discuss any questions you have with your health care provider. Document Released: 05/31/2007 Document Revised: 08/04/2015 Document Reviewed: 02/05/2015 Elsevier Interactive Patient Education  2018 [place] Anesthesia, Adult, Care After These instructions provide you with information about caring for yourself after your procedure. Your health care provider may also give you more specific instructions. Your treatment has been planned according to current medical practices, but problems sometimes occur. Call your health care provider if you have any problems or questions after your procedure. What can I  expect after the procedure? After the procedure, it is common to have:  Vomiting.  A sore throat.  Mental slowness.  It is common to feel:  Nauseous.  Cold or shivery.  Sleepy.  Tired.  Sore or achy, even in parts of your body where you did not have surgery.  Follow these instructions at home: For at least 24 hours after the procedure:  Do not: ? Participate in activities where you could fall or become injured. ? Drive. ? Use heavy machinery. ? Drink alcohol. ? Take sleeping pills or medicines that cause drowsiness. ? Make important decisions or sign legal documents. ? Take care of children on your own.  Rest. Eating and drinking  If you vomit, drink water, juice, or soup when you can drink without vomiting.  Drink enough fluid to keep your urine clear or pale yellow.  Make sure you have little or no nausea before eating solid foods.  Follow the diet recommended by your health care provider. General instructions  Have a responsible adult stay with you until you are awake and alert.  Return to your normal activities as told by your health care provider. Ask your health care provider what activities are safe for you.  Take over-the-counter and prescription medicines only as told by your health care provider.  If you smoke, do not smoke without supervision.  Keep all follow-up visits as told by your health care provider. This is important. Contact a health care provider if:  You continue to have nausea or vomiting at home, and medicines are not helpful.  You cannot drink fluids or start eating again.  You cannot  urinate after 8-12 hours.  You develop a skin rash.  You have fever.  You have increasing redness at the site of your procedure. Get help right away if:  You have difficulty breathing.  You have chest pain.  You have unexpected bleeding.  You feel that you are having a life-threatening or urgent problem. This information is not intended  to replace advice given to you by your health care provider. Make sure you discuss any questions you have with your health care provider. Document Released: 05/30/2000 Document Revised: 07/27/2015 Document Reviewed: 02/05/2015 Elsevier Interactive Patient Education  [employer].
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
## 2017-02-03 ENCOUNTER — Other Ambulatory Visit: Payer: Self-pay | Admitting: Family Medicine

## 2017-02-06 ENCOUNTER — Encounter (HOSPITAL_COMMUNITY)
Admission: RE | Admit: 2017-02-06 | Discharge: 2017-02-06 | Disposition: A | Discharge status: Home / Self Care | Payer: BLUE CROSS/BLUE SHIELD | Source: Ambulatory Visit | Attending: Internal Medicine | Admitting: Internal Medicine

## 2017-02-06 ENCOUNTER — Other Ambulatory Visit: Payer: Self-pay

## 2017-02-06 ENCOUNTER — Encounter (HOSPITAL_COMMUNITY): Payer: Self-pay

## 2017-02-06 ENCOUNTER — Other Ambulatory Visit (HOSPITAL_COMMUNITY): Payer: Self-pay

## 2017-02-06 DIAGNOSIS — Z01818 Encounter for other preprocedural examination: Secondary | ICD-10-CM | POA: Diagnosis not present

## 2017-02-06 LAB — BASIC METABOLIC PANEL
ANION GAP: 8 (ref 5–15)
BUN: 9 mg/dL (ref 6–20)
CALCIUM: 8.5 mg/dL — AB (ref 8.9–10.3)
CO2: 25 mmol/L (ref 22–32)
CREATININE: 0.73 mg/dL (ref 0.44–1.00)
Chloride: 104 mmol/L (ref 101–111)
Glucose, Bld: 127 mg/dL — ABNORMAL HIGH (ref 65–99)
Potassium: 3.7 mmol/L (ref 3.5–5.1)
SODIUM: 137 mmol/L (ref 135–145)

## 2017-02-06 LAB — CBC
HCT: 34.7 % — ABNORMAL LOW (ref 36.0–46.0)
Hemoglobin: 11 g/dL — ABNORMAL LOW (ref 12.0–15.0)
MCH: 27.6 pg (ref 26.0–34.0)
MCHC: 31.7 g/dL (ref 30.0–36.0)
MCV: 87.2 fL (ref 78.0–100.0)
PLATELETS: 389 10*3/uL (ref 150–400)
RBC: 3.98 MIL/uL (ref 3.87–5.11)
RDW: 16.4 % — AB (ref 11.5–15.5)
WBC: 11.2 10*3/uL — AB (ref 4.0–10.5)

## 2017-02-06 LAB — HCG, SERUM, QUALITATIVE: Preg, Serum: NEGATIVE

## 2017-02-06 NOTE — Telephone Encounter (Signed)
Seen [phone]

## 2017-02-07 ENCOUNTER — Ambulatory Visit (HOSPITAL_COMMUNITY): Payer: BLUE CROSS/BLUE SHIELD | Attending: Family Medicine | Admitting: Physical Therapy

## 2017-02-07 DIAGNOSIS — M62838 Other muscle spasm: Secondary | ICD-10-CM | POA: Diagnosis not present

## 2017-02-07 DIAGNOSIS — M542 Cervicalgia: Secondary | ICD-10-CM | POA: Diagnosis not present

## 2017-02-07 DIAGNOSIS — M5412 Radiculopathy, cervical region: Secondary | ICD-10-CM | POA: Diagnosis not present
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
## 2017-02-09 ENCOUNTER — Encounter (HOSPITAL_COMMUNITY): Payer: Self-pay

## 2017-02-10 ENCOUNTER — Encounter (HOSPITAL_COMMUNITY): Payer: Self-pay

## 2017-02-10 ENCOUNTER — Ambulatory Visit (HOSPITAL_COMMUNITY): Payer: BLUE CROSS/BLUE SHIELD

## 2017-02-10 ENCOUNTER — Telehealth: Payer: Self-pay | Admitting: *Deleted

## 2017-02-10 DIAGNOSIS — M5412 Radiculopathy, cervical region: Secondary | ICD-10-CM

## 2017-02-10 DIAGNOSIS — M542 Cervicalgia: Secondary | ICD-10-CM | POA: Diagnosis not present

## 2017-02-10 DIAGNOSIS — M62838 Other muscle spasm: Secondary | ICD-10-CM | POA: Diagnosis not present

## 2017-02-10 NOTE — Telephone Encounter (Signed)
Patient called in to cancel TCS W/ propofol on 02/13/17 w/ RMR. I have called Hoyle Sauer and left a message informing her. Patient r/s'd to 03/23/17 at 12:15pm. New instructions have been mailed to patient.
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
## 2017-02-10 NOTE — Patient Instructions (Signed)

## 2017-02-15 ENCOUNTER — Encounter: Payer: Self-pay | Admitting: *Deleted

## 2017-02-15 NOTE — Telephone Encounter (Signed)
Called spoke with pt and is aware pre-op appt is scheduled for 03/20/17 at 9:00am. Letter mailed to patient.

## 2017-02-17 ENCOUNTER — Ambulatory Visit (HOSPITAL_COMMUNITY): Payer: BLUE CROSS/BLUE SHIELD

## 2017-02-17 DIAGNOSIS — M542 Cervicalgia: Secondary | ICD-10-CM | POA: Diagnosis not present

## 2017-02-17 DIAGNOSIS — M62838 Other muscle spasm: Secondary | ICD-10-CM

## 2017-02-17 DIAGNOSIS — M5412 Radiculopathy, cervical region: Secondary | ICD-10-CM | POA: Diagnosis not present
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
## 2017-02-20 ENCOUNTER — Other Ambulatory Visit: Payer: Self-pay | Admitting: Family Medicine

## 2017-02-25 ENCOUNTER — Other Ambulatory Visit: Payer: Self-pay | Admitting: Family Medicine

## 2017-03-01 ENCOUNTER — Other Ambulatory Visit: Payer: Self-pay | Admitting: Family Medicine

## 2017-03-13 ENCOUNTER — Other Ambulatory Visit: Payer: Self-pay | Admitting: Family Medicine

## 2017-03-16 NOTE — Patient Instructions (Signed)
[name]  03/16/2017     @PREFPERIOPPHARMACY @   Your procedure is scheduled on 03/23/2017   Report to [facility] at  1015   A.M.  Call this number if you have problems the morning of surgery:  [phone]   Remember:  Do not eat food or drink liquids after midnight.  Take these medicines the morning of surgery with A SIP OF WATER  Zivirax, wellbutrin, zyrtec, flexaril, singulair, prilosec. Use your inhaler before you come.   Do not wear jewelry, make-up or nail polish.  Do not wear lotions, powders, or perfumes, or deodorant.  Do not shave 48 hours prior to surgery.  Men may shave face and neck.  Do not bring valuables to the hospital.  [facility] is not responsible for any belongings or valuables.  Contacts, dentures or bridgework may not be worn into surgery.  Leave your suitcase in the car.  After surgery it may be brought to your room.  For patients admitted to the hospital, discharge time will be determined by your treatment team.  Patients discharged the day of surgery will not be allowed to drive home.   Name and phone number of your driver:   family Special instructions:  Follow the diet and prep instructions given to you by Dr Roseanne Kaufman office.  Please read over the following fact sheets that you were given. Anesthesia Post-op Instructions and Care and Recovery After Surgery       Colonoscopy, Adult A colonoscopy is an exam to look at the large intestine. It is done to check for problems, such as:  Lumps (tumors).  Growths (polyps).  Swelling (inflammation).  Bleeding.  What happens before the procedure? Eating and drinking Follow instructions from your doctor about eating and drinking. These instructions may include:  A few days before the procedure - follow a low-fiber diet. ? Avoid nuts. ? Avoid seeds. ? Avoid dried fruit. ? Avoid raw fruits. ? Avoid vegetables.  1-3 days before the procedure - follow a clear liquid diet.  Avoid liquids that have red or purple dye. Drink only clear liquids, such as: ? Clear broth or bouillon. ? Black coffee or tea. ? Clear juice. ? Clear soft drinks or sports drinks. ? Gelatin dessert. ? Popsicles.  On the day of the procedure - do not eat or drink anything during the 2 hours before the procedure.  Bowel prep If you were prescribed an oral bowel prep:  Take it as told by your doctor. Starting the day before your procedure, you will need to drink a lot of liquid. The liquid will cause you to poop (have bowel movements) until your poop is almost clear or light green.  If your skin or butt gets irritated from diarrhea, you may: ? Wipe the area with wipes that have medicine in them, such as adult wet wipes with aloe and vitamin E. ? Put something on your skin that soothes the area, such as petroleum jelly.  If you throw up (vomit) while drinking the bowel prep, take a break for up to 60 minutes. Then begin the bowel prep again. If you keep throwing up and you cannot take the bowel prep without throwing up, call your doctor.  General instructions  Ask your doctor about changing or stopping your normal medicines. This is important if you take diabetes medicines or blood thinners.  Plan to have someone take you home from the hospital or clinic. What  happens during the procedure?  An IV tube may be put into one of your veins.  You will be given medicine to help you relax (sedative).  To reduce your risk of infection: ? Your doctors will wash their hands. ? Your anal area will be washed with soap.  You will be asked to lie on your side with your knees bent.  Your doctor will get a long, thin, flexible tube ready. The tube will have a camera and a light on the end.  The tube will be put into your anus.  The tube will be gently put into your large intestine.  Air will be delivered into your large intestine to keep it open. You may feel some pressure or cramping.  The  camera will be used to take photos.  A small tissue sample may be removed from your body to be looked at under a microscope (biopsy). If any possible problems are found, the tissue will be sent to a lab for testing.  If small growths are found, your doctor may remove them and have them checked for cancer.  The tube that was put into your anus will be slowly removed. The procedure may vary among doctors and hospitals. What happens after the procedure?  Your doctor will check on you often until the medicines you were given have worn off.  Do not drive for 24 hours after the procedure.  You may have a small amount of blood in your poop.  You may pass gas.  You may have mild cramps or bloating in your belly (abdomen).  It is up to you to get the results of your procedure. Ask your doctor, or the department performing the procedure, when your results will be ready. This information is not intended to replace advice given to you by your health care provider. Make sure you discuss any questions you have with your health care provider. Document Released: 03/26/2010 Document Revised: 12/23/2015 Document Reviewed: 05/05/2015 Elsevier Interactive Patient Education  2017 [employer]  Colonoscopy, Adult, Care After This sheet gives you information about how to care for yourself after your procedure. Your health care provider may also give you more specific instructions. If you have problems or questions, contact your health care provider. What can I expect after the procedure? After the procedure, it is common to have:  A small amount of blood in your stool for 24 hours after the procedure.  Some gas.  Mild abdominal cramping or bloating.  Follow these instructions at home: General instructions   For the first 24 hours after the procedure: ? Do not drive or use machinery. ? Do not sign important documents. ? Do not drink alcohol. ? Do your regular daily activities at a slower pace  than normal. ? Eat soft, easy-to-digest foods. ? Rest often.  Take over-the-counter or prescription medicines only as told by your health care provider.  It is up to you to get the results of your procedure. Ask your health care provider, or the department performing the procedure, when your results will be ready. Relieving cramping and bloating  Try walking around when you have cramps or feel bloated.  Apply heat to your abdomen as told by your health care provider. Use a heat source that your health care provider recommends, such as a moist heat pack or a heating pad. ? Place a towel between your skin and the heat source. ? Leave the heat on for 20-30 minutes. ? Remove the heat if your skin  turns bright red. This is especially important if you are unable to feel pain, heat, or cold. You may have a greater risk of getting burned. Eating and drinking  Drink enough fluid to keep your urine clear or pale yellow.  Resume your normal diet as instructed by your health care provider. Avoid heavy or fried foods that are hard to digest.  Avoid drinking alcohol for as long as instructed by your health care provider. Contact a health care provider if:  You have blood in your stool 2-3 days after the procedure. Get help right away if:  You have more than a small spotting of blood in your stool.  You pass large blood clots in your stool.  Your abdomen is swollen.  You have nausea or vomiting.  You have a fever.  You have increasing abdominal pain that is not relieved with medicine. This information is not intended to replace advice given to you by your health care provider. Make sure you discuss any questions you have with your health care provider. Document Released: 10/06/2003 Document Revised: 11/16/2015 Document Reviewed: 05/05/2015 Elsevier Interactive Patient Education  2018 [place] Anesthesia is a term that refers to techniques, procedures, and  medicines that help a person stay safe and comfortable during a medical procedure. Monitored anesthesia care, or sedation, is one type of anesthesia. Your anesthesia specialist may recommend sedation if you will be having a procedure that does not require you to be unconscious, such as:  Cataract surgery.  A dental procedure.  A biopsy.  A colonoscopy.  During the procedure, you may receive a medicine to help you relax (sedative). There are three levels of sedation:  Mild sedation. At this level, you may feel awake and relaxed. You will be able to follow directions.  Moderate sedation. At this level, you will be sleepy. You may not remember the procedure.  Deep sedation. At this level, you will be asleep. You will not remember the procedure.  The more medicine you are given, the deeper your level of sedation will be. Depending on how you respond to the procedure, the anesthesia specialist may change your level of sedation or the type of anesthesia to fit your needs. An anesthesia specialist will monitor you closely during the procedure. Let your health care provider know about:  Any allergies you have.  All medicines you are taking, including vitamins, herbs, eye drops, creams, and over-the-counter medicines.  Any use of steroids (by mouth or as a cream).  Any problems you or family members have had with sedatives and anesthetic medicines.  Any blood disorders you have.  Any surgeries you have had.  Any medical conditions you have, such as sleep apnea.  Whether you are pregnant or may be pregnant.  Any use of cigarettes, alcohol, or street drugs. What are the risks? Generally, this is a safe procedure. However, problems may occur, including:  Getting too much medicine (oversedation).  Nausea.  Allergic reaction to medicines.  Trouble breathing. If this happens, a breathing tube may be used to help with breathing. It will be removed when you are awake and breathing on  your own.  Heart trouble.  Lung trouble.  Before the procedure Staying hydrated Follow instructions from your health care provider about hydration, which may include:  Up to 2 hours before the procedure - you may continue to drink clear liquids, such as water, clear fruit juice, black coffee, and plain tea.  Eating and drinking restrictions Follow instructions  from your health care provider about eating and drinking, which may include:  8 hours before the procedure - stop eating heavy meals or foods such as meat, fried foods, or fatty foods.  6 hours before the procedure - stop eating light meals or foods, such as toast or cereal.  6 hours before the procedure - stop drinking milk or drinks that contain milk.  2 hours before the procedure - stop drinking clear liquids.  Medicines Ask your health care provider about:  Changing or stopping your regular medicines. This is especially important if you are taking diabetes medicines or blood thinners.  Taking medicines such as aspirin and ibuprofen. These medicines can thin your blood. Do not take these medicines before your procedure if your health care provider instructs you not to.  Tests and exams  You will have a physical exam.  You may have blood tests done to show: ? How well your kidneys and liver are working. ? How well your blood can clot.  General instructions  Plan to have someone take you home from the hospital or clinic.  If you will be going home right after the procedure, plan to have someone with you for 24 hours.  What happens during the procedure?  Your blood pressure, heart rate, breathing, level of pain and overall condition will be monitored.  An IV tube will be inserted into one of your veins.  Your anesthesia specialist will give you medicines as needed to keep you comfortable during the procedure. This may mean changing the level of sedation.  The procedure will be performed. After the  procedure  Your blood pressure, heart rate, breathing rate, and blood oxygen level will be monitored until the medicines you were given have worn off.  Do not drive for 24 hours if you received a sedative.  You may: ? Feel sleepy, clumsy, or nauseous. ? Feel forgetful about what happened after the procedure. ? Have a sore throat if you had a breathing tube during the procedure. ? Vomit. This information is not intended to replace advice given to you by your health care provider. Make sure you discuss any questions you have with your health care provider. Document Released: 11/17/2004 Document Revised: 07/31/2015 Document Reviewed: 06/14/2015 Elsevier Interactive Patient Education  2018 [place], Care After These instructions provide you with information about caring for yourself after your procedure. Your health care provider may also give you more specific instructions. Your treatment has been planned according to current medical practices, but problems sometimes occur. Call your health care provider if you have any problems or questions after your procedure. What can I expect after the procedure? After your procedure, it is common to:  Feel sleepy for several hours.  Feel clumsy and have poor balance for several hours.  Feel forgetful about what happened after the procedure.  Have poor judgment for several hours.  Feel nauseous or vomit.  Have a sore throat if you had a breathing tube during the procedure.  Follow these instructions at home: For at least 24 hours after the procedure:   Do not: ? Participate in activities in which you could fall or become injured. ? Drive. ? Use heavy machinery. ? Drink alcohol. ? Take sleeping pills or medicines that cause drowsiness. ? Make important decisions or sign legal documents. ? Take care of children on your own.  Rest. Eating and drinking  Follow the diet that is recommended by your health  care provider.  If  you vomit, drink water, juice, or soup when you can drink without vomiting.  Make sure you have little or no nausea before eating solid foods. General instructions  Have a responsible adult stay with you until you are awake and alert.  Take over-the-counter and prescription medicines only as told by your health care provider.  If you smoke, do not smoke without supervision.  Keep all follow-up visits as told by your health care provider. This is important. Contact a health care provider if:  You keep feeling nauseous or you keep vomiting.  You feel light-headed.  You develop a rash.  You have a fever. Get help right away if:  You have trouble breathing. This information is not intended to replace advice given to you by your health care provider. Make sure you discuss any questions you have with your health care provider. Document Released: 06/14/2015 Document Revised: 10/14/2015 Document Reviewed: 06/14/2015 Elsevier Interactive Patient Education  [employer].

## 2017-03-20 ENCOUNTER — Encounter (HOSPITAL_COMMUNITY)
Admission: RE | Admit: 2017-03-20 | Discharge: 2017-03-20 | Disposition: A | Discharge status: Home / Self Care | Payer: BLUE CROSS/BLUE SHIELD | Source: Ambulatory Visit | Attending: Internal Medicine | Admitting: Internal Medicine

## 2017-03-21 ENCOUNTER — Encounter (HOSPITAL_COMMUNITY): Payer: Self-pay

## 2017-03-21 ENCOUNTER — Other Ambulatory Visit: Payer: Self-pay

## 2017-03-21 ENCOUNTER — Encounter (HOSPITAL_COMMUNITY)
Admission: RE | Admit: 2017-03-21 | Discharge: 2017-03-21 | Disposition: A | Discharge status: Home / Self Care | Payer: BLUE CROSS/BLUE SHIELD | Source: Ambulatory Visit | Attending: Internal Medicine | Admitting: Internal Medicine

## 2017-03-21 DIAGNOSIS — K5080 Crohn's disease of both small and large intestine without complications: Secondary | ICD-10-CM | POA: Diagnosis not present

## 2017-03-21 DIAGNOSIS — Z01818 Encounter for other preprocedural examination: Secondary | ICD-10-CM | POA: Diagnosis not present

## 2017-03-21 DIAGNOSIS — K219 Gastro-esophageal reflux disease without esophagitis: Secondary | ICD-10-CM | POA: Diagnosis not present

## 2017-03-21 DIAGNOSIS — Z79899 Other long term (current) drug therapy: Secondary | ICD-10-CM | POA: Diagnosis not present

## 2017-03-21 DIAGNOSIS — K635 Polyp of colon: Secondary | ICD-10-CM | POA: Diagnosis not present

## 2017-03-21 DIAGNOSIS — Z9989 Dependence on other enabling machines and devices: Secondary | ICD-10-CM | POA: Diagnosis not present

## 2017-03-21 DIAGNOSIS — J45909 Unspecified asthma, uncomplicated: Secondary | ICD-10-CM | POA: Diagnosis not present

## 2017-03-21 DIAGNOSIS — Z888 Allergy status to other drugs, medicaments and biological substances status: Secondary | ICD-10-CM | POA: Diagnosis not present

## 2017-03-21 DIAGNOSIS — F319 Bipolar disorder, unspecified: Secondary | ICD-10-CM | POA: Diagnosis not present

## 2017-03-21 DIAGNOSIS — Z91010 Allergy to peanuts: Secondary | ICD-10-CM | POA: Diagnosis not present

## 2017-03-21 DIAGNOSIS — K633 Ulcer of intestine: Secondary | ICD-10-CM | POA: Diagnosis not present

## 2017-03-21 DIAGNOSIS — G4730 Sleep apnea, unspecified: Secondary | ICD-10-CM | POA: Diagnosis not present

## 2017-03-21 DIAGNOSIS — Z8711 Personal history of peptic ulcer disease: Secondary | ICD-10-CM | POA: Diagnosis not present

## 2017-03-21 DIAGNOSIS — F419 Anxiety disorder, unspecified: Secondary | ICD-10-CM | POA: Diagnosis not present

## 2017-03-21 DIAGNOSIS — Z9049 Acquired absence of other specified parts of digestive tract: Secondary | ICD-10-CM | POA: Diagnosis not present

## 2017-03-21 DIAGNOSIS — Z8719 Personal history of other diseases of the digestive system: Secondary | ICD-10-CM | POA: Diagnosis not present

## 2017-03-21 DIAGNOSIS — Z87891 Personal history of nicotine dependence: Secondary | ICD-10-CM | POA: Diagnosis not present

## 2017-03-21 DIAGNOSIS — D519 Vitamin B12 deficiency anemia, unspecified: Secondary | ICD-10-CM | POA: Diagnosis not present

## 2017-03-21 DIAGNOSIS — Z1211 Encounter for screening for malignant neoplasm of colon: Secondary | ICD-10-CM | POA: Diagnosis not present

## 2017-03-21 DIAGNOSIS — Z91013 Allergy to seafood: Secondary | ICD-10-CM | POA: Diagnosis not present

## 2017-03-21 DIAGNOSIS — Z885 Allergy status to narcotic agent status: Secondary | ICD-10-CM | POA: Diagnosis not present

## 2017-03-21 DIAGNOSIS — M1990 Unspecified osteoarthritis, unspecified site: Secondary | ICD-10-CM | POA: Diagnosis not present

## 2017-03-21 LAB — PREGNANCY, URINE: Preg Test, Ur: NEGATIVE

## 2017-03-23 ENCOUNTER — Other Ambulatory Visit: Payer: Self-pay

## 2017-03-23 ENCOUNTER — Encounter (HOSPITAL_COMMUNITY): Payer: Self-pay | Admitting: *Deleted

## 2017-03-23 ENCOUNTER — Ambulatory Visit (HOSPITAL_COMMUNITY)
Admission: RE | Admit: 2017-03-23 | Discharge: 2017-03-23 | Disposition: A | Discharge status: Home / Self Care | Payer: BLUE CROSS/BLUE SHIELD | Source: Ambulatory Visit | Attending: Internal Medicine | Admitting: Internal Medicine

## 2017-03-23 ENCOUNTER — Encounter (HOSPITAL_COMMUNITY)
Admission: RE | Disposition: A | Discharge status: Home / Self Care | Payer: Self-pay | Source: Ambulatory Visit | Attending: Internal Medicine

## 2017-03-23 ENCOUNTER — Ambulatory Visit (HOSPITAL_COMMUNITY): Payer: BLUE CROSS/BLUE SHIELD | Admitting: Anesthesiology

## 2017-03-23 DIAGNOSIS — K5090 Crohn's disease, unspecified, without complications: Secondary | ICD-10-CM | POA: Diagnosis not present

## 2017-03-23 DIAGNOSIS — G4730 Sleep apnea, unspecified: Secondary | ICD-10-CM | POA: Insufficient documentation

## 2017-03-23 DIAGNOSIS — F319 Bipolar disorder, unspecified: Secondary | ICD-10-CM | POA: Diagnosis not present

## 2017-03-23 DIAGNOSIS — Z91010 Allergy to peanuts: Secondary | ICD-10-CM | POA: Insufficient documentation

## 2017-03-23 DIAGNOSIS — Z9989 Dependence on other enabling machines and devices: Secondary | ICD-10-CM | POA: Diagnosis not present

## 2017-03-23 DIAGNOSIS — J45909 Unspecified asthma, uncomplicated: Secondary | ICD-10-CM | POA: Diagnosis not present

## 2017-03-23 DIAGNOSIS — D519 Vitamin B12 deficiency anemia, unspecified: Secondary | ICD-10-CM | POA: Diagnosis not present

## 2017-03-23 DIAGNOSIS — M1990 Unspecified osteoarthritis, unspecified site: Secondary | ICD-10-CM | POA: Diagnosis not present

## 2017-03-23 DIAGNOSIS — Z8719 Personal history of other diseases of the digestive system: Secondary | ICD-10-CM | POA: Insufficient documentation

## 2017-03-23 DIAGNOSIS — K6389 Other specified diseases of intestine: Secondary | ICD-10-CM | POA: Diagnosis not present

## 2017-03-23 DIAGNOSIS — Z79899 Other long term (current) drug therapy: Secondary | ICD-10-CM | POA: Insufficient documentation

## 2017-03-23 DIAGNOSIS — K5080 Crohn's disease of both small and large intestine without complications: Secondary | ICD-10-CM | POA: Diagnosis not present

## 2017-03-23 DIAGNOSIS — Z888 Allergy status to other drugs, medicaments and biological substances status: Secondary | ICD-10-CM | POA: Diagnosis not present

## 2017-03-23 DIAGNOSIS — Z8711 Personal history of peptic ulcer disease: Secondary | ICD-10-CM | POA: Insufficient documentation

## 2017-03-23 DIAGNOSIS — Z1211 Encounter for screening for malignant neoplasm of colon: Secondary | ICD-10-CM | POA: Insufficient documentation

## 2017-03-23 DIAGNOSIS — Z9049 Acquired absence of other specified parts of digestive tract: Secondary | ICD-10-CM | POA: Insufficient documentation

## 2017-03-23 DIAGNOSIS — Z87891 Personal history of nicotine dependence: Secondary | ICD-10-CM | POA: Insufficient documentation

## 2017-03-23 DIAGNOSIS — K219 Gastro-esophageal reflux disease without esophagitis: Secondary | ICD-10-CM | POA: Diagnosis not present

## 2017-03-23 DIAGNOSIS — K633 Ulcer of intestine: Secondary | ICD-10-CM | POA: Insufficient documentation

## 2017-03-23 DIAGNOSIS — F419 Anxiety disorder, unspecified: Secondary | ICD-10-CM | POA: Insufficient documentation

## 2017-03-23 DIAGNOSIS — Z91013 Allergy to seafood: Secondary | ICD-10-CM | POA: Insufficient documentation

## 2017-03-23 DIAGNOSIS — Z01818 Encounter for other preprocedural examination: Secondary | ICD-10-CM | POA: Insufficient documentation

## 2017-03-23 DIAGNOSIS — K50019 Crohn's disease of small intestine with unspecified complications: Secondary | ICD-10-CM

## 2017-03-23 DIAGNOSIS — K635 Polyp of colon: Secondary | ICD-10-CM | POA: Insufficient documentation

## 2017-03-23 DIAGNOSIS — Z885 Allergy status to narcotic agent status: Secondary | ICD-10-CM | POA: Insufficient documentation

## 2017-03-23 HISTORY — PX: COLONOSCOPY WITH PROPOFOL: SHX5780

## 2017-03-23 HISTORY — PX: BIOPSY: SHX5522

## 2017-03-23 HISTORY — PX: POLYPECTOMY: SHX5525

## 2017-03-23 SURGERY — COLONOSCOPY WITH PROPOFOL
Anesthesia: Monitor Anesthesia Care

## 2017-03-23 MED ORDER — CHLORHEXIDINE GLUCONATE CLOTH 2 % EX PADS: 6.0000 | MEDICATED_PAD | Freq: Once | CUTANEOUS | Status: DC

## 2017-03-23 MED ORDER — PROPOFOL 500 MG/50ML IV EMUL
INTRAVENOUS | Status: DC | PRN
  Administered 2017-03-23: 11:00:00 via INTRAVENOUS
  Administered 2017-03-23: 150 ug/kg/min via INTRAVENOUS

## 2017-03-23 MED ORDER — FENTANYL CITRATE (PF) 100 MCG/2ML IJ SOLN
INTRAMUSCULAR | Status: AC
  Filled 2017-03-23: qty 2

## 2017-03-23 MED ORDER — LACTATED RINGERS IV SOLN
INTRAVENOUS | Status: DC
  Administered 2017-03-23: 11:00:00 via INTRAVENOUS

## 2017-03-23 MED ORDER — MIDAZOLAM HCL 2 MG/2ML IJ SOLN
INTRAMUSCULAR | Status: AC
  Filled 2017-03-23: qty 2

## 2017-03-23 MED ORDER — FENTANYL CITRATE (PF) 100 MCG/2ML IJ SOLN
25.0000 ug | Freq: Once | INTRAMUSCULAR | Status: AC
  Administered 2017-03-23: 25 ug via INTRAVENOUS

## 2017-03-23 MED ORDER — PROPOFOL 10 MG/ML IV BOLUS
INTRAVENOUS | Status: AC
  Filled 2017-03-23: qty 40

## 2017-03-23 MED ORDER — MIDAZOLAM HCL 2 MG/2ML IJ SOLN
1.0000 mg | INTRAMUSCULAR | Status: AC
  Administered 2017-03-23: 2 mg via INTRAVENOUS

## 2017-03-23 MED ORDER — LACTATED RINGERS IV SOLN: INTRAVENOUS | Status: DC | PRN

## 2017-03-23 MED ORDER — SODIUM CHLORIDE 0.9 % IV SOLN: INTRAVENOUS | Status: DC | PRN

## 2017-03-23 NOTE — Anesthesia Postprocedure Evaluation (Signed)
Anesthesia Post Note  Patient: [name]  Procedure(s) Performed: COLONOSCOPY WITH PROPOFOL (N/A ) POLYPECTOMY BIOPSY  Patient location during evaluation: PACU Anesthesia Type: MAC Level of consciousness: awake and alert, oriented and patient cooperative Pain management: pain level controlled Vital Signs Assessment: post-procedure vital signs reviewed and stable Respiratory status: spontaneous breathing and respiratory function stable Cardiovascular status: stable Postop Assessment: no apparent nausea or vomiting Anesthetic complications: no     Last Vitals:  Vitals:   03/23/17 0943  BP: (P) 113/78  Pulse: (P) 93  Resp: (P) 14  Temp: (P) 37.2 C  SpO2: 96%    Last Pain:  Vitals:   03/23/17 0943  TempSrc: (P) Oral                 ADAMS, AMY A

## 2017-03-23 NOTE — Discharge Instructions (Signed)
°  Continue present medical regimen  Further recommendations to follow pending review of pathology report  Office visit with [place] in 6 months Colonoscopy Discharge Instructions  Read the instructions outlined below and refer to this sheet in the next few weeks. These discharge instructions provide you with general information on caring for yourself after you leave the hospital. Your doctor may also give you specific instructions. While your treatment has been planned according to the most current medical practices available, unavoidable complications occasionally occur. If you have any problems or questions after discharge, call Dr. Gala Romney at [phone]. ACTIVITY  You may resume your regular activity, but move at a slower pace for the next 24 hours.   Take frequent rest periods for the next 24 hours.   Walking will help get rid of the air and reduce the bloated feeling in your belly (abdomen).   No driving for 24 hours (because of the medicine (anesthesia) used during the test).    Do not sign any important legal documents or operate any machinery for 24 hours (because of the anesthesia used during the test).  NUTRITION  Drink plenty of fluids.   You may resume your normal diet as instructed by your doctor.   Begin with a light meal and progress to your normal diet. Heavy or fried foods are harder to digest and may make you feel sick to your stomach (nauseated).   Avoid alcoholic beverages for 24 hours or as instructed.  MEDICATIONS  You may resume your normal medications unless your doctor tells you otherwise.  WHAT YOU CAN EXPECT TODAY  Some feelings of bloating in the abdomen.   Passage of more gas than usual.   Spotting of blood in your stool or on the toilet paper.  IF YOU HAD POLYPS REMOVED DURING THE COLONOSCOPY:  No aspirin products for 7 days or as instructed.   No alcohol for 7 days or as instructed.   Eat a soft diet for the next 24 hours.  FINDING OUT THE RESULTS  OF YOUR TEST Not all test results are available during your visit. If your test results are not back during the visit, make an appointment with your caregiver to find out the results. Do not assume everything is normal if you have not heard from your caregiver or the medical facility. It is important for you to follow up on all of your test results.  SEEK IMMEDIATE MEDICAL ATTENTION IF:  You have more than a spotting of blood in your stool.   Your belly is swollen (abdominal distention).   You are nauseated or vomiting.   You have a temperature over 101.   You have abdominal pain or discomfort that is severe or gets worse throughout the day.

## 2017-03-23 NOTE — Anesthesia Procedure Notes (Signed)
Procedure Name: MAC Performed by: Adams, Amy A, CRNA Pre-anesthesia Checklist: Patient identified, Emergency Drugs available, Suction available, Patient being monitored and Timeout performed Oxygen Delivery Method: Simple face mask       

## 2017-03-23 NOTE — Transfer of Care (Signed)
Immediate Anesthesia Transfer of Care Note  Patient: [name]  Procedure(s) Performed: COLONOSCOPY WITH PROPOFOL (N/A ) POLYPECTOMY BIOPSY  Patient Location: PACU  Anesthesia Type:MAC  Level of Consciousness: awake, alert , oriented and patient cooperative  Airway & Oxygen Therapy: Patient Spontanous Breathing and Patient connected to nasal cannula oxygen  Post-op Assessment: Report given to RN and Post -op Vital signs reviewed and stable  Post vital signs: Reviewed and stable  Last Vitals:  Vitals:   03/23/17 0943  BP: (P) 113/78  Pulse: (P) 93  Resp: (P) 14  Temp: (P) 37.2 C  SpO2: 96%    Last Pain:  Vitals:   03/23/17 0943  TempSrc: (P) Oral         Complications: No apparent anesthesia complications
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
## 2017-03-23 NOTE — Anesthesia Preprocedure Evaluation (Signed)
Anesthesia Evaluation  Patient identified by MRN, date of birth, ID band Patient awake    Reviewed: Allergy & Precautions, NPO status , Patient's Chart, lab work & pertinent test results  Airway Mallampati: II  TM Distance: >3 FB Neck ROM: Full    Dental  (+) Teeth Intact   Pulmonary asthma , sleep apnea , former smoker,    breath sounds clear to auscultation       Cardiovascular negative cardio ROS   Rhythm:Regular Rate:Normal     Neuro/Psych PSYCHIATRIC DISORDERS Anxiety Depression Bipolar Disorder    GI/Hepatic PUD, GERD  ,  Endo/Other    Renal/GU      Musculoskeletal  (+) Arthritis  (chronic LBP),   Abdominal   Peds  Hematology  (+) anemia ,   Anesthesia Other Findings   Reproductive/Obstetrics                             Anesthesia Physical Anesthesia Plan  ASA: III  Anesthesia Plan: MAC   Post-op Pain Management:    Induction: Intravenous  PONV Risk Score and Plan:   Airway Management Planned: Simple Face Mask  Additional Equipment:   Intra-op Plan:   Post-operative Plan:   Informed Consent: I have reviewed the patients History and Physical, chart, labs and discussed the procedure including the risks, benefits and alternatives for the proposed anesthesia with the patient or authorized representative who has indicated his/her understanding and acceptance.     Plan Discussed with:   Anesthesia Plan Comments:         Anesthesia Quick Evaluation

## 2017-03-23 NOTE — Op Note (Addendum)
[facility] Patient Name: [name] Procedure Date: 03/23/2017 10:23 AM MRN: [id number] Date of Birth: 1971-01-06 Attending MD: Norvel Richards , MD CSN: [id number] Age: 47 Admit Type: Outpatient Procedure:                Colonoscopy Indications:              High risk colon cancer surveillance: Crohn's small                            and large intestine Providers:                Norvel Richards, MD, Otis Peak B. Sharon Seller, RN,                            Nelma Rothman, Technician, Randa Spike, Technician Referring MD:              Medicines:                Propofol per Anesthesia Complications:            No immediate complications. Estimated Blood Loss:     Estimated blood loss was minimal. Procedure:                Pre-Anesthesia Assessment:                           - Prior to the procedure, a History and Physical                            was performed, and patient medications and                            allergies were reviewed. The patient's tolerance of                            previous anesthesia was also reviewed. The risks                            and benefits of the procedure and the sedation                            options and risks were discussed with the patient.                            All questions were answered, and informed consent                            was obtained. Prior Anticoagulants: The patient has                            taken no previous anticoagulant or antiplatelet                            agents. ASA Grade Assessment: II - A patient with  mild systemic disease. After reviewing the risks                            and benefits, the patient was deemed in                            satisfactory condition to undergo the procedure.                           After obtaining informed consent, the colonoscope                            was passed under direct vision. Throughout the          procedure, the patient's blood pressure, pulse, and                            oxygen saturations were monitored continuously. The                            EC-[id number] ([id number]) scope was introduced through                            the and advanced to the the ileocolonic                            anastomosis. The colonoscopy was performed without                            difficulty. The patient tolerated the procedure                            well. The quality of the bowel preparation was                            adequate. The rectum was photographed. Scope In: 10:55:05 AM Scope Out: 11:14:13 AM Scope Withdrawal Time: 0 hours 10 minutes 59 seconds  Total Procedure Duration: 0 hours 19 minutes 8 seconds  Findings:      The perianal and digital rectal examinations were normal. status post       right hemicolectomy. Anastomosis identified. Unable to intubate the       distal neoterminal ileum. Small erosion at the anastomosis. A single 3       mm polyp at anastomosis. Both lesions biopsied. The remainder of       residual colonic mucosa appeared normal Impression:               status post right hemicolectomy.Single erosion                            polyp at the anastomosis - status post biopsy                           - Moderate Sedation:      Moderate (conscious) sedation was personally administered by an       anesthesia professional. The following parameters were  monitored: oxygen       saturation, heart rate, blood pressure, respiratory rate, EKG, adequacy       of pulmonary ventilation, and response to care. Total physician       intraservice time was 25 minutes. Recommendation:           - Patient has a contact number available for                            emergencies. The signs and symptoms of potential                            delayed complications were discussed with the                            patient. Return to normal activities tomorrow.                             Written discharge instructions were provided to the                            patient.                           - Advance diet as tolerated.                           - Continue present medications.                           - Repeat colonoscopy after studies are complete for                            surveillance based on pathology results.                           - Return to GI clinic in 6 months. Procedure Code(s):        --- Professional ---                           [phone], Colonoscopy, flexible; diagnostic, including                            collection of specimen(s) by brushing or washing,                            when performed (separate procedure) Diagnosis Code(s):        --- Professional ---                           K50.80, Crohn's disease of both small and large                            intestine without complications CPT copyright 2016 [employer]. All rights reserved. The codes documented in this report are preliminary and upon coder review may  be revised to meet current compliance requirements.  Cristopher Estimable. Alyanna Stoermer, MD Norvel Richards, MD 03/23/2017 11:27:17 AM This report has been signed electronically. Number of Addenda: 0

## 2017-03-24 ENCOUNTER — Other Ambulatory Visit: Payer: Self-pay | Admitting: Family Medicine

## 2017-03-27 ENCOUNTER — Encounter (HOSPITAL_COMMUNITY): Payer: Self-pay | Admitting: Internal Medicine

## 2017-03-31 ENCOUNTER — Encounter: Payer: Self-pay | Admitting: Internal Medicine

## 2017-04-03 ENCOUNTER — Encounter: Payer: Self-pay | Admitting: Gastroenterology

## 2017-04-19 ENCOUNTER — Other Ambulatory Visit: Payer: Self-pay | Admitting: Family Medicine

## 2017-04-20 ENCOUNTER — Other Ambulatory Visit: Payer: Self-pay | Admitting: Family Medicine

## 2017-04-24 ENCOUNTER — Other Ambulatory Visit: Payer: Self-pay | Admitting: Family Medicine

## 2017-05-08 ENCOUNTER — Other Ambulatory Visit: Payer: Self-pay | Admitting: Family Medicine

## 2017-05-11 ENCOUNTER — Encounter (HOSPITAL_COMMUNITY): Payer: Self-pay

## 2017-05-11 ENCOUNTER — Emergency Department (HOSPITAL_COMMUNITY): Payer: BLUE CROSS/BLUE SHIELD

## 2017-05-11 ENCOUNTER — Other Ambulatory Visit: Payer: Self-pay

## 2017-05-11 ENCOUNTER — Inpatient Hospital Stay (HOSPITAL_COMMUNITY)
Admission: EM | Admit: 2017-05-11 | Discharge: 2017-05-15 | DRG: 386 | Disposition: A | Discharge status: Home / Self Care | Payer: BLUE CROSS/BLUE SHIELD | Attending: Internal Medicine | Admitting: Internal Medicine

## 2017-05-11 DIAGNOSIS — Z86010 Personal history of colonic polyps: Secondary | ICD-10-CM | POA: Diagnosis not present

## 2017-05-11 DIAGNOSIS — Z9049 Acquired absence of other specified parts of digestive tract: Secondary | ICD-10-CM | POA: Diagnosis not present

## 2017-05-11 DIAGNOSIS — Z8711 Personal history of peptic ulcer disease: Secondary | ICD-10-CM | POA: Diagnosis not present

## 2017-05-11 DIAGNOSIS — K50012 Crohn's disease of small intestine with intestinal obstruction: Secondary | ICD-10-CM | POA: Diagnosis not present

## 2017-05-11 DIAGNOSIS — Z9989 Dependence on other enabling machines and devices: Secondary | ICD-10-CM | POA: Diagnosis not present

## 2017-05-11 DIAGNOSIS — Z91013 Allergy to seafood: Secondary | ICD-10-CM

## 2017-05-11 DIAGNOSIS — R110 Nausea: Secondary | ICD-10-CM | POA: Diagnosis not present

## 2017-05-11 DIAGNOSIS — Z888 Allergy status to other drugs, medicaments and biological substances status: Secondary | ICD-10-CM

## 2017-05-11 DIAGNOSIS — E861 Hypovolemia: Secondary | ICD-10-CM | POA: Diagnosis present

## 2017-05-11 DIAGNOSIS — E876 Hypokalemia: Secondary | ICD-10-CM | POA: Diagnosis not present

## 2017-05-11 DIAGNOSIS — R000 Tachycardia, unspecified: Secondary | ICD-10-CM | POA: Diagnosis not present

## 2017-05-11 DIAGNOSIS — Z91018 Allergy to other foods: Secondary | ICD-10-CM

## 2017-05-11 DIAGNOSIS — D473 Essential (hemorrhagic) thrombocythemia: Secondary | ICD-10-CM | POA: Diagnosis present

## 2017-05-11 DIAGNOSIS — K50812 Crohn's disease of both small and large intestine with intestinal obstruction: Principal | ICD-10-CM | POA: Diagnosis present

## 2017-05-11 DIAGNOSIS — K50019 Crohn's disease of small intestine with unspecified complications: Secondary | ICD-10-CM | POA: Diagnosis present

## 2017-05-11 DIAGNOSIS — Z825 Family history of asthma and other chronic lower respiratory diseases: Secondary | ICD-10-CM

## 2017-05-11 DIAGNOSIS — Z6832 Body mass index (BMI) 32.0-32.9, adult: Secondary | ICD-10-CM

## 2017-05-11 DIAGNOSIS — D75839 Thrombocytosis, unspecified: Secondary | ICD-10-CM | POA: Insufficient documentation

## 2017-05-11 DIAGNOSIS — F329 Major depressive disorder, single episode, unspecified: Secondary | ICD-10-CM | POA: Diagnosis not present

## 2017-05-11 DIAGNOSIS — E669 Obesity, unspecified: Secondary | ICD-10-CM | POA: Diagnosis present

## 2017-05-11 DIAGNOSIS — D519 Vitamin B12 deficiency anemia, unspecified: Secondary | ICD-10-CM | POA: Diagnosis present

## 2017-05-11 DIAGNOSIS — D72829 Elevated white blood cell count, unspecified: Secondary | ICD-10-CM | POA: Diagnosis not present

## 2017-05-11 DIAGNOSIS — F324 Major depressive disorder, single episode, in partial remission: Secondary | ICD-10-CM | POA: Diagnosis present

## 2017-05-11 DIAGNOSIS — T380X5A Adverse effect of glucocorticoids and synthetic analogues, initial encounter: Secondary | ICD-10-CM | POA: Diagnosis present

## 2017-05-11 DIAGNOSIS — D638 Anemia in other chronic diseases classified elsewhere: Secondary | ICD-10-CM | POA: Diagnosis present

## 2017-05-11 DIAGNOSIS — J45909 Unspecified asthma, uncomplicated: Secondary | ICD-10-CM | POA: Diagnosis not present

## 2017-05-11 DIAGNOSIS — F418 Other specified anxiety disorders: Secondary | ICD-10-CM | POA: Diagnosis present

## 2017-05-11 DIAGNOSIS — F319 Bipolar disorder, unspecified: Secondary | ICD-10-CM | POA: Diagnosis present

## 2017-05-11 DIAGNOSIS — N281 Cyst of kidney, acquired: Secondary | ICD-10-CM | POA: Diagnosis not present

## 2017-05-11 DIAGNOSIS — D72828 Other elevated white blood cell count: Secondary | ICD-10-CM | POA: Diagnosis not present

## 2017-05-11 DIAGNOSIS — G4730 Sleep apnea, unspecified: Secondary | ICD-10-CM | POA: Diagnosis not present

## 2017-05-11 DIAGNOSIS — K219 Gastro-esophageal reflux disease without esophagitis: Secondary | ICD-10-CM | POA: Diagnosis not present

## 2017-05-11 DIAGNOSIS — Z79899 Other long term (current) drug therapy: Secondary | ICD-10-CM

## 2017-05-11 DIAGNOSIS — K56600 Partial intestinal obstruction, unspecified as to cause: Secondary | ICD-10-CM | POA: Diagnosis not present

## 2017-05-11 DIAGNOSIS — K50912 Crohn's disease, unspecified, with intestinal obstruction: Secondary | ICD-10-CM | POA: Insufficient documentation

## 2017-05-11 DIAGNOSIS — Z87891 Personal history of nicotine dependence: Secondary | ICD-10-CM

## 2017-05-11 DIAGNOSIS — Z885 Allergy status to narcotic agent status: Secondary | ICD-10-CM

## 2017-05-11 DIAGNOSIS — D508 Other iron deficiency anemias: Secondary | ICD-10-CM | POA: Diagnosis not present

## 2017-05-11 DIAGNOSIS — Z818 Family history of other mental and behavioral disorders: Secondary | ICD-10-CM

## 2017-05-11 DIAGNOSIS — F411 Generalized anxiety disorder: Secondary | ICD-10-CM | POA: Diagnosis present

## 2017-05-11 DIAGNOSIS — E860 Dehydration: Secondary | ICD-10-CM | POA: Diagnosis not present

## 2017-05-11 DIAGNOSIS — E871 Hypo-osmolality and hyponatremia: Secondary | ICD-10-CM | POA: Diagnosis not present

## 2017-05-11 DIAGNOSIS — Z91010 Allergy to peanuts: Secondary | ICD-10-CM

## 2017-05-11 DIAGNOSIS — R1033 Periumbilical pain: Secondary | ICD-10-CM

## 2017-05-11 DIAGNOSIS — Z9851 Tubal ligation status: Secondary | ICD-10-CM

## 2017-05-11 DIAGNOSIS — R69 Illness, unspecified: Secondary | ICD-10-CM | POA: Diagnosis not present

## 2017-05-11 LAB — COMPREHENSIVE METABOLIC PANEL
ALK PHOS: 72 U/L (ref 38–126)
ALT: 16 U/L (ref 14–54)
AST: 22 U/L (ref 15–41)
Albumin: 3.5 g/dL (ref 3.5–5.0)
Anion gap: 11 (ref 5–15)
BILIRUBIN TOTAL: 0.5 mg/dL (ref 0.3–1.2)
BUN: 7 mg/dL (ref 6–20)
CHLORIDE: 96 mmol/L — AB (ref 101–111)
CO2: 23 mmol/L (ref 22–32)
CREATININE: 0.74 mg/dL (ref 0.44–1.00)
Calcium: 9.2 mg/dL (ref 8.9–10.3)
GFR calc Af Amer: >60 mL/min (ref >60)
GFR calc non Af Amer: >60 mL/min (ref >60)
GLUCOSE: 118 mg/dL — AB (ref 65–99)
POTASSIUM: 4 mmol/L (ref 3.5–5.1)
Sodium: 130 mmol/L — ABNORMAL LOW (ref 135–145)
Total Protein: 8.5 g/dL — ABNORMAL HIGH (ref 6.5–8.1)

## 2017-05-11 LAB — CBC WITH DIFFERENTIAL/PLATELET
Basophils Absolute: 0 10*3/uL (ref 0.0–0.1)
Basophils Relative: 0 %
Eosinophils Absolute: 0 10*3/uL (ref 0.0–0.7)
Eosinophils Relative: 0 %
HEMATOCRIT: 38.5 % (ref 36.0–46.0)
HEMOGLOBIN: 12.5 g/dL (ref 12.0–15.0)
Lymphocytes Relative: 23 %
Lymphs Abs: 3.8 10*3/uL (ref 0.7–4.0)
MCH: 27.8 pg (ref 26.0–34.0)
MCHC: 32.5 g/dL (ref 30.0–36.0)
MCV: 85.7 fL (ref 78.0–100.0)
MONO ABS: 1 10*3/uL (ref 0.1–1.0)
MONOS PCT: 6 %
NEUTROS ABS: 11.8 10*3/uL — AB (ref 1.7–7.7)
NEUTROS PCT: 71 %
Platelets: 427 10*3/uL — ABNORMAL HIGH (ref 150–400)
RBC: 4.49 MIL/uL (ref 3.87–5.11)
RDW: 15.4 % (ref 11.5–15.5)
WBC: 16.6 10*3/uL — ABNORMAL HIGH (ref 4.0–10.5)

## 2017-05-11 LAB — URINALYSIS, ROUTINE W REFLEX MICROSCOPIC
BILIRUBIN URINE: NEGATIVE
Glucose, UA: NEGATIVE mg/dL
HGB URINE DIPSTICK: NEGATIVE
Ketones, ur: 5 mg/dL — AB
Leukocytes, UA: NEGATIVE
Nitrite: NEGATIVE
PH: 5 (ref 5.0–8.0)
Protein, ur: NEGATIVE mg/dL
Specific Gravity, Urine: 1.013 (ref 1.005–1.030)

## 2017-05-11 LAB — PREGNANCY, URINE: PREG TEST UR: NEGATIVE

## 2017-05-11 LAB — LIPASE, BLOOD: LIPASE: 30 U/L (ref 11–51)

## 2017-05-11 MED ORDER — ACETAMINOPHEN 650 MG RE SUPP: 650.0000 mg | Freq: Four times a day (QID) | RECTAL | Status: DC | PRN

## 2017-05-11 MED ORDER — ACETAMINOPHEN 325 MG PO TABS: 650.0000 mg | ORAL_TABLET | Freq: Four times a day (QID) | ORAL | Status: DC | PRN

## 2017-05-11 MED ORDER — SALONPAS ARTHRITIS PAIN RELIEF EX PADS: 1.0000 | MEDICATED_PAD | Freq: Three times a day (TID) | CUTANEOUS | Status: DC | PRN

## 2017-05-11 MED ORDER — MORPHINE SULFATE (PF) 4 MG/ML IV SOLN
4.0000 mg | Freq: Once | INTRAVENOUS | Status: AC
  Administered 2017-05-11: 4 mg via INTRAVENOUS
  Filled 2017-05-11: qty 1

## 2017-05-11 MED ORDER — MORPHINE SULFATE (PF) 2 MG/ML IV SOLN
2.0000 mg | INTRAVENOUS | Status: DC | PRN
  Administered 2017-05-11 (×2): 4 mg via INTRAVENOUS
  Administered 2017-05-12 (×2): 2 mg via INTRAVENOUS
  Administered 2017-05-12: 4 mg via INTRAVENOUS
  Administered 2017-05-12 – 2017-05-15 (×13): 2 mg via INTRAVENOUS
  Filled 2017-05-11 (×4): qty 1
  Filled 2017-05-11: qty 2
  Filled 2017-05-11 (×5): qty 1
  Filled 2017-05-11: qty 2
  Filled 2017-05-11 (×2): qty 1
  Filled 2017-05-11: qty 2
  Filled 2017-05-11: qty 1
  Filled 2017-05-11: qty 2
  Filled 2017-05-11 (×2): qty 1

## 2017-05-11 MED ORDER — ENOXAPARIN SODIUM 40 MG/0.4ML ~~LOC~~ SOLN
40.0000 mg | SUBCUTANEOUS | Status: DC
  Administered 2017-05-11 – 2017-05-14 (×4): 40 mg via SUBCUTANEOUS
  Filled 2017-05-11 (×4): qty 0.4

## 2017-05-11 MED ORDER — SODIUM CHLORIDE 0.9 % IV SOLN
INTRAVENOUS | Status: AC
  Administered 2017-05-11 – 2017-05-12 (×3): via INTRAVENOUS

## 2017-05-11 MED ORDER — PROMETHAZINE HCL 25 MG/ML IJ SOLN
12.5000 mg | Freq: Four times a day (QID) | INTRAMUSCULAR | Status: DC | PRN
  Administered 2017-05-14: 25 mg via INTRAVENOUS
  Filled 2017-05-11: qty 1

## 2017-05-11 MED ORDER — FLUTICASONE PROPIONATE 50 MCG/ACT NA SUSP
2.0000 | Freq: Every day | NASAL | Status: DC
  Administered 2017-05-12 – 2017-05-15 (×3): 2 via NASAL
  Filled 2017-05-11 (×2): qty 16

## 2017-05-11 MED ORDER — LORAZEPAM 2 MG/ML IJ SOLN: 0.5000 mg | INTRAMUSCULAR | Status: DC | PRN

## 2017-05-11 MED ORDER — FAMOTIDINE IN NACL 20-0.9 MG/50ML-% IV SOLN
20.0000 mg | Freq: Two times a day (BID) | INTRAVENOUS | Status: DC
  Administered 2017-05-11 – 2017-05-14 (×6): 20 mg via INTRAVENOUS
  Filled 2017-05-11 (×6): qty 50

## 2017-05-11 MED ORDER — IOPAMIDOL (ISOVUE-300) INJECTION 61%
100.0000 mL | Freq: Once | INTRAVENOUS | Status: AC | PRN
  Administered 2017-05-11: 100 mL via INTRAVENOUS

## 2017-05-11 MED ORDER — ONDANSETRON HCL 4 MG/2ML IJ SOLN
4.0000 mg | Freq: Once | INTRAMUSCULAR | Status: AC
  Administered 2017-05-11: 4 mg via INTRAVENOUS
  Filled 2017-05-11: qty 2

## 2017-05-11 MED ORDER — SODIUM CHLORIDE 0.9 % IV SOLN
25.0000 mg | INTRAVENOUS | Status: DC | PRN
  Filled 2017-05-11: qty 0.5

## 2017-05-11 MED ORDER — ONDANSETRON HCL 4 MG PO TABS
4.0000 mg | ORAL_TABLET | Freq: Four times a day (QID) | ORAL | Status: DC | PRN
  Administered 2017-05-14: 4 mg via ORAL

## 2017-05-11 MED ORDER — SODIUM CHLORIDE 0.9 % IV BOLUS (SEPSIS)
1000.0000 mL | Freq: Once | INTRAVENOUS | Status: AC
  Administered 2017-05-11: 1000 mL via INTRAVENOUS

## 2017-05-11 MED ORDER — ONDANSETRON HCL 4 MG/2ML IJ SOLN
4.0000 mg | Freq: Four times a day (QID) | INTRAMUSCULAR | Status: DC | PRN
  Administered 2017-05-11 – 2017-05-14 (×4): 4 mg via INTRAVENOUS
  Filled 2017-05-11 (×5): qty 2
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
## 2017-05-11 NOTE — ED Triage Notes (Signed)
Pt reports  Burning pain behind umbilicus since last night.  Denies n/v/d

## 2017-05-11 NOTE — ED Notes (Signed)
Pt states her abdominal pain is returning.  Rates pain an 8/10.  Pt almost done with contrast.  Orders received from Dr. Lacinda Axon.

## 2017-05-11 NOTE — ED Notes (Signed)
Pt starting to drink contrast at this time.  Verbalizes relief of pain and nausea.
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
## 2017-05-12 DIAGNOSIS — K50912 Crohn's disease, unspecified, with intestinal obstruction: Secondary | ICD-10-CM

## 2017-05-12 DIAGNOSIS — K56600 Partial intestinal obstruction, unspecified as to cause: Secondary | ICD-10-CM

## 2017-05-12 DIAGNOSIS — R110 Nausea: Secondary | ICD-10-CM

## 2017-05-12 DIAGNOSIS — R1033 Periumbilical pain: Secondary | ICD-10-CM

## 2017-05-12 DIAGNOSIS — K50019 Crohn's disease of small intestine with unspecified complications: Secondary | ICD-10-CM

## 2017-05-12 LAB — GLUCOSE, CAPILLARY: GLUCOSE-CAPILLARY: 73 mg/dL (ref 65–99)

## 2017-05-12 LAB — CBC WITH DIFFERENTIAL/PLATELET
Basophils Absolute: 0 10*3/uL (ref 0.0–0.1)
Basophils Relative: 0 %
EOS ABS: 0.2 10*3/uL (ref 0.0–0.7)
Eosinophils Relative: 2 %
HCT: 38.1 % (ref 36.0–46.0)
HEMOGLOBIN: 12.1 g/dL (ref 12.0–15.0)
LYMPHS ABS: 4.7 10*3/uL — AB (ref 0.7–4.0)
Lymphocytes Relative: 55 %
MCH: 27.8 pg (ref 26.0–34.0)
MCHC: 31.8 g/dL (ref 30.0–36.0)
MCV: 87.4 fL (ref 78.0–100.0)
MONOS PCT: 13 %
Monocytes Absolute: 1.1 10*3/uL — ABNORMAL HIGH (ref 0.1–1.0)
NEUTROS PCT: 30 %
Neutro Abs: 2.6 10*3/uL (ref 1.7–7.7)
Platelets: 366 10*3/uL (ref 150–400)
RBC: 4.36 MIL/uL (ref 3.87–5.11)
RDW: 16.1 % — ABNORMAL HIGH (ref 11.5–15.5)
WBC: 8.5 10*3/uL (ref 4.0–10.5)

## 2017-05-12 LAB — MAGNESIUM: Magnesium: 1.8 mg/dL (ref 1.7–2.4)

## 2017-05-12 MED ORDER — PREDNISONE 20 MG PO TABS
40.0000 mg | ORAL_TABLET | Freq: Every day | ORAL | Status: DC
  Administered 2017-05-12 – 2017-05-15 (×3): 40 mg via ORAL
  Filled 2017-05-12 (×4): qty 2

## 2017-05-12 MED ORDER — MUSCLE RUB 10-15 % EX CREA
TOPICAL_CREAM | Freq: Three times a day (TID) | CUTANEOUS | Status: DC | PRN
  Filled 2017-05-12: qty 85

## 2017-05-12 NOTE — Progress Notes (Signed)
PROGRESS NOTE    [name]  NID:[id number] DOB: 15-Mar-1970 DOA: 05/11/2017 PCP: Fayrene Helper, MD     Brief Narrative:  47 year old woman admitted from home on 3/7.  She has a history of Crohn's disease and is status post right hemicolectomy who has been doing well on Humira.  In January 2019 had a surveillance colonoscopy with a single ulcer.  She began having abdominal symptoms about 2 nights ago with pain, nausea and diarrhea.  On CT scan was found to have diffuse wall thickening of the distal small bowel with adjacent inflammation compatible with active Crohn's disease/inflammation with an associated partial small bowel obstruction or ileus.  Admission was requested.   Assessment & Plan:   Principal Problem:   Partial small bowel obstruction (HCC) Active Problems:   Anxiety state   Depression, controlled   Crohn's disease of ileum with complication (HCC)   Periumbilical abdominal pain   Nausea without vomiting   Crohn's disease with acute flareup -Seems a little improved today, will defer to GI whether steroids are needed.  Currently not receiving steroid therapy. -Remains n.p.o. -If okay with GI can consider diet advancement this evening. -CT also consistent with a partial small bowel obstruction, follow clinically for now.  Depression with anxiety -Mood stable, resume of region and trazodone when she is tolerating oral intake.  DVT prophylaxis: Lovenox Code Status: Full code Family Communication: Patient only Disposition Plan: Pending improvement in GI symptoms  Consultants:   Gastroenterology  Surgery  Procedures:   None  Antimicrobials:  Anti-infectives (From admission, onward)   None       Subjective: Still with mild abdominal pain although improved from admission, nauseous  Objective: Vitals:   05/11/17 1703 05/11/17 1935 05/11/17 2003 05/12/17 0449  BP: 132/73  (!) 116/58 (!) 104/50  Pulse: (!) 107  (!) 104 95  Resp: 16     Temp:  (!) 97.5 F (36.4 C)  98.7 F (37.1 C) 98.4 F (36.9 C)  TempSrc: Oral  Oral Oral  SpO2: 97% 100% 100% 99%  Weight: 96.9 kg (213 lb 10 oz)     Height:        Intake/Output Summary (Last 24 hours) at 05/12/2017 1602 Last data filed at 05/12/2017 1500 Gross per 24 hour  Intake 1374.58 ml  Output -  Net 1374.58 ml   Filed Weights   05/11/17 0748 05/11/17 1703  Weight: 93.9 kg (207 lb) 96.9 kg (213 lb 10 oz)    Examination:  General exam: Alert, awake, oriented x 3 Respiratory system: Clear to auscultation. Respiratory effort normal. Cardiovascular system:RRR. No murmurs, rubs, gallops. Gastrointestinal system: Abdomen is nondistended, soft and nontender. No organomegaly or masses felt. Normal bowel sounds heard. Central nervous system: Alert and oriented. No focal neurological deficits. Extremities: No C/C/E, +pedal pulses Skin: No rashes, lesions or ulcers Psychiatry: Judgement and insight appear normal. Mood & affect appropriate.     Data Reviewed: I have personally reviewed following labs and imaging studies  CBC: Recent Labs  Lab 05/11/17 0829 05/12/17 0740  WBC 16.6* 8.5  NEUTROABS 11.8* 2.6  HGB 12.5 12.1  HCT 38.5 38.1  MCV 85.7 87.4  PLT 427* 144   Basic Metabolic Panel: Recent Labs  Lab 05/11/17 0829 05/12/17 0740  NA 130*  --   K 4.0  --   CL 96*  --   CO2 23  --   GLUCOSE 118*  --   BUN 7  --   CREATININE 0.74  --  CALCIUM 9.2  --   MG  --  1.8   GFR: Estimated Creatinine Clearance: 105.8 mL/min (by C-G formula based on SCr of 0.74 mg/dL). Liver Function Tests: Recent Labs  Lab 05/11/17 0829  AST 22  ALT 16  ALKPHOS 72  BILITOT 0.5  PROT 8.5*  ALBUMIN 3.5   Recent Labs  Lab 05/11/17 0829  LIPASE 30   No results for input(s): AMMONIA in the last 168 hours. Coagulation Profile: No results for input(s): INR, PROTIME in the last 168 hours. Cardiac Enzymes: No results for input(s): CKTOTAL, CKMB, CKMBINDEX, TROPONINI in the last  168 hours. BNP (last 3 results) No results for input(s): PROBNP in the last 8760 hours. HbA1C: No results for input(s): HGBA1C in the last 72 hours. CBG: Recent Labs  Lab 05/12/17 0800  GLUCAP 73   Lipid Profile: No results for input(s): CHOL, HDL, LDLCALC, TRIG, CHOLHDL, LDLDIRECT in the last 72 hours. Thyroid Function Tests: No results for input(s): TSH, T4TOTAL, FREET4, T3FREE, THYROIDAB in the last 72 hours. Anemia Panel: No results for input(s): VITAMINB12, FOLATE, FERRITIN, TIBC, IRON, RETICCTPCT in the last 72 hours. Urine analysis:    Component Value Date/Time   COLORURINE YELLOW 05/11/2017 0750   APPEARANCEUR HAZY (A) 05/11/2017 0750   LABSPEC 1.013 05/11/2017 0750   PHURINE 5.0 05/11/2017 0750   GLUCOSEU NEGATIVE 05/11/2017 0750   HGBUR NEGATIVE 05/11/2017 0750   HGBUR negative 08/10/2009 1013   BILIRUBINUR NEGATIVE 05/11/2017 0750   KETONESUR 5 (A) 05/11/2017 0750   PROTEINUR NEGATIVE 05/11/2017 0750   UROBILINOGEN 0.2 10/17/2010 1249   NITRITE NEGATIVE 05/11/2017 0750   LEUKOCYTESUR NEGATIVE 05/11/2017 0750   Sepsis Labs: @LABRCNTIP (procalcitonin:4,lacticidven:4)  )No results found for this or any previous visit (from the past 240 hour(s)).       Radiology Studies: Ct Abdomen Pelvis W Contrast  Result Date: 05/11/2017 CLINICAL DATA:  47 year old female with acute abdominal and pelvic pain. History of Crohn's disease. EXAM: CT ABDOMEN AND PELVIS WITH CONTRAST TECHNIQUE: Multidetector CT imaging of the abdomen and pelvis was performed using the standard protocol following bolus administration of intravenous contrast. CONTRAST:  13m ISOVUE-300 IOPAMIDOL (ISOVUE-300) INJECTION 61% COMPARISON:  10/17/2010 and prior radiographs FINDINGS: Lower chest: Minimal bibasilar atelectasis noted. Hepatobiliary: The liver is unchanged. No suspicious focal hepatic lesions identified. The patient is status post cholecystectomy. Mild fullness of the intrahepatic biliary system  noted. The CBD is normal caliber. Pancreas: Unremarkable Spleen: Unremarkable Adrenals/Urinary Tract: The kidneys, adrenal glands and bladder are unremarkable except for a RIGHT renal cyst. Stomach/Bowel: There is diffuse wall thickening of the distal small bowel just proximal to the colon measuring 8 cm in length, with adjacent inflammation. There is dilatation of the small bowel proximal to this area compatible with partial small bowel obstruction or ileus. Bowel surgical changes are again noted. Vascular/Lymphatic: No significant vascular findings are present. No enlarged abdominal or pelvic lymph nodes. Reproductive: Uterus and bilateral adnexa are unremarkable. Other: No ascites, abscess or pneumoperitoneum. Musculoskeletal: No acute bony abnormality or suspicious bony lesion. IMPRESSION: 1. Diffuse wall thickening of the distal small bowel with adjacent inflammation (just proximal to the colon and measuring 8 cm in length), compatible with active Crohn's disease/inflammation. Associated partial small bowel obstruction or ileus. No abscess or pneumoperitoneum. Electronically Signed   By: JMargarette CanadaM.D.   On: 05/11/2017 13:46        Scheduled Meds: . enoxaparin (LOVENOX) injection  40 mg Subcutaneous Q24H  . fluticasone  2 spray Each Nare  Daily   Continuous Infusions: . diphenhydrAMINE    . famotidine (PEPCID) IV Stopped (05/12/17 0914)     LOS: 1 day    Time spent: 25 minutes. Greater than 50% of this time was spent in direct contact with the patient coordinating care.     Lelon Frohlich, MD [facility] Pager [phone]  If 7PM-7AM, please contact night-coverage [email or web address] Password [email or web address] 05/12/2017, 4:02 PM
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
## 2017-05-13 DIAGNOSIS — K50019 Crohn's disease of small intestine with unspecified complications: Secondary | ICD-10-CM

## 2017-05-13 DIAGNOSIS — K56600 Partial intestinal obstruction, unspecified as to cause: Secondary | ICD-10-CM

## 2017-05-13 DIAGNOSIS — R110 Nausea: Secondary | ICD-10-CM

## 2017-05-13 LAB — BASIC METABOLIC PANEL
ANION GAP: 12 (ref 5–15)
BUN: 8 mg/dL (ref 6–20)
CALCIUM: 8.1 mg/dL — AB (ref 8.9–10.3)
CHLORIDE: 104 mmol/L (ref 101–111)
CO2: 21 mmol/L — AB (ref 22–32)
CREATININE: 0.61 mg/dL (ref 0.44–1.00)
GFR calc non Af Amer: >60 mL/min (ref >60)
Glucose, Bld: 80 mg/dL (ref 65–99)
Potassium: 3.4 mmol/L — ABNORMAL LOW (ref 3.5–5.1)
SODIUM: 137 mmol/L (ref 135–145)

## 2017-05-13 LAB — CBC
HCT: 36.1 % (ref 36.0–46.0)
Hemoglobin: 11.5 g/dL — ABNORMAL LOW (ref 12.0–15.0)
MCH: 27.6 pg (ref 26.0–34.0)
MCHC: 31.9 g/dL (ref 30.0–36.0)
MCV: 86.6 fL (ref 78.0–100.0)
PLATELETS: 403 10*3/uL — AB (ref 150–400)
RBC: 4.17 MIL/uL (ref 3.87–5.11)
RDW: 15.8 % — ABNORMAL HIGH (ref 11.5–15.5)
WBC: 7 10*3/uL (ref 4.0–10.5)

## 2017-05-13 LAB — HIV ANTIBODY (ROUTINE TESTING W REFLEX): HIV Screen 4th Generation wRfx: NONREACTIVE

## 2017-05-13 LAB — C-REACTIVE PROTEIN: CRP: 6.6 mg/dL — AB (ref <1.0)

## 2017-05-13 LAB — GLUCOSE, CAPILLARY
GLUCOSE-CAPILLARY: 66 mg/dL (ref 65–99)
Glucose-Capillary: 90 mg/dL (ref 65–99)

## 2017-05-13 NOTE — Progress Notes (Signed)
  Subjective:  Patient feels much better.  She has mild discomfort in right lower quadrant.  She had a BM and she is passing flatus.  She is not having any difficulty with clear liquids.  She does not take OTC NSAIDs.  She denies any significant change in her lifestyle or stress.  Objective: Blood pressure 114/66, pulse 95, temperature 98.3 F (36.8 C), temperature source Oral, resp. rate 16, height 5' 8"  (1.727 m), weight 213 lb 10 oz (96.9 kg), last menstrual period 04/19/2017, SpO2 100 %. Patient is alert and in no acute distress. Abdomen is full.  Bowel sounds are normal.  On palpation abdomen is soft with mild tenderness in RLQ.  No organomegaly or masses. No LE edema or clubbing noted.  Labs/studies Results:  Recent Labs    06-01-2017 0829 05/12/17 0740 05/13/17 0657  WBC 16.6* 8.5 7.0  HGB 12.5 12.1 11.5*  HCT 38.5 38.1 36.1  PLT 427* 366 403*    BMET  Recent Labs    2017/06/01 0829 05/13/17 0657  NA 130* 137  K 4.0 3.4*  CL 96* 104  CO2 23 21*  GLUCOSE 118* 80  BUN 7 8  CREATININE 0.74 0.61  CALCIUM 9.2 8.1*    LFT  Recent Labs    01-Jun-2017 0829  PROT 8.5*  ALBUMIN 3.5  AST 22  ALT 16  ALKPHOS 72  BILITOT 0.5    CT images from current study of 2 days ago reviewed and compared with prior study of 2012.  Assessment:  #1.  Small bowel obstruction secondary to flareup of small bowel Crohn's disease appears to be secondary to stricture proximal to anastomosis.  It appears this segment was not seen on recent colonoscopy of March 23, 2017.  It remains to be seen if she needs higher dose of Humira or needs to be switched to another agent. Patient tolerating clear liquids and diet has been advanced to full liquids by Dr. Arnoldo Morale. Patient has been transitioned to p.o. steroids.  She is on prednisone 40 mg daily.  Recommendations:  CRP. Will check adalimumab drug and antibody levels to determine whether she needs to be switched to another medication or needs  higher dose.

## 2017-05-13 NOTE — Progress Notes (Signed)
PROGRESS NOTE    [name]  BXU:[id number] DOB: March 07, 1971 DOA: 05/11/2017 PCP: Fayrene Helper, MD     Brief Narrative:  47 year old woman admitted from home on 3/7.  She has a history of Crohn's disease and is status post right hemicolectomy who has been doing well on Humira.  In January 2019 had a surveillance colonoscopy with a single ulcer.  She began having abdominal symptoms about 2 nights ago with pain, nausea and diarrhea.  On CT scan was found to have diffuse wall thickening of the distal small bowel with adjacent inflammation compatible with active Crohn's disease/inflammation with an associated partial small bowel obstruction or ileus.  Admission was requested.  As of 3/9 she is tolerating clear liquids, has decreased abdominal pain, has had a bowel movement and is passing flatus.   Assessment & Plan:   Principal Problem:   Partial small bowel obstruction (HCC) Active Problems:   Anxiety state   Depression, controlled   Crohn's disease of ileum with complication (HCC)   Periumbilical abdominal pain   Nausea without vomiting   Crohn's disease with acute flareup -Is improved today, diet has been advanced to full liquids. -Has been started on prednisone per GI recommendations. -Remains n.p.o. -CT also consistent with a partial small bowel obstruction, that seems to have resolved  Depression with anxiety -Mood stable, resume of region and trazodone when she is tolerating oral intake.  DVT prophylaxis: Lovenox Code Status: Full code Family Communication: Patient only Disposition Plan: Pending improvement in GI symptoms, anticipate 24-48 hours  Consultants:   Gastroenterology  Surgery  Procedures:   None  Antimicrobials:  Anti-infectives (From admission, onward)   None       Subjective: Pain is improved, has not had nausea, tolerating clear liquid diet  Objective: Vitals:   05/12/17 1300 05/12/17 2100 05/13/17 0451 05/13/17 1411  BP: (!)  119/53 (!) 106/55 114/66 (!) 111/48  Pulse: 92 80 95 85  Resp:  16  18  Temp: 98.2 F (36.8 C) 97.9 F (36.6 C) 98.3 F (36.8 C) 98.1 F (36.7 C)  TempSrc: Oral Oral Oral   SpO2: 100% 98% 100% 100%  Weight:   96.9 kg (213 lb 10 oz)   Height:        Intake/Output Summary (Last 24 hours) at 05/13/2017 1520 Last data filed at 05/13/2017 1300 Gross per 24 hour  Intake 890 ml  Output -  Net 890 ml   Filed Weights   05/11/17 0748 05/11/17 1703 05/13/17 0451  Weight: 93.9 kg (207 lb) 96.9 kg (213 lb 10 oz) 96.9 kg (213 lb 10 oz)    Examination:  General exam: Alert, awake, oriented x 3 Respiratory system: Clear to auscultation. Respiratory effort normal. Cardiovascular system:RRR. No murmurs, rubs, gallops. Gastrointestinal system: Abdomen is nondistended, soft and nontender. No organomegaly or masses felt. Normal bowel sounds heard. Central nervous system: Alert and oriented. No focal neurological deficits. Extremities: No C/C/E, +pedal pulses Skin: No rashes, lesions or ulcers Psychiatry: Judgement and insight appear normal. Mood & affect appropriate.      Data Reviewed: I have personally reviewed following labs and imaging studies  CBC: Recent Labs  Lab 05/11/17 0829 05/12/17 0740 05/13/17 0657  WBC 16.6* 8.5 7.0  NEUTROABS 11.8* 2.6  --   HGB 12.5 12.1 11.5*  HCT 38.5 38.1 36.1  MCV 85.7 87.4 86.6  PLT 427* 366 191*   Basic Metabolic Panel: Recent Labs  Lab 05/11/17 0829 05/12/17 0740 05/13/17 0657  NA  130*  --  137  K 4.0  --  3.4*  CL 96*  --  104  CO2 23  --  21*  GLUCOSE 118*  --  80  BUN 7  --  8  CREATININE 0.74  --  0.61  CALCIUM 9.2  --  8.1*  MG  --  1.8  --    GFR: Estimated Creatinine Clearance: 105.8 mL/min (by C-G formula based on SCr of 0.61 mg/dL). Liver Function Tests: Recent Labs  Lab 05/11/17 0829  AST 22  ALT 16  ALKPHOS 72  BILITOT 0.5  PROT 8.5*  ALBUMIN 3.5   Recent Labs  Lab 05/11/17 0829  LIPASE 30   No results  for input(s): AMMONIA in the last 168 hours. Coagulation Profile: No results for input(s): INR, PROTIME in the last 168 hours. Cardiac Enzymes: No results for input(s): CKTOTAL, CKMB, CKMBINDEX, TROPONINI in the last 168 hours. BNP (last 3 results) No results for input(s): PROBNP in the last 8760 hours. HbA1C: No results for input(s): HGBA1C in the last 72 hours. CBG: Recent Labs  Lab 05/12/17 0800 05/13/17 0753 05/13/17 1115  GLUCAP 73 66 90   Lipid Profile: No results for input(s): CHOL, HDL, LDLCALC, TRIG, CHOLHDL, LDLDIRECT in the last 72 hours. Thyroid Function Tests: No results for input(s): TSH, T4TOTAL, FREET4, T3FREE, THYROIDAB in the last 72 hours. Anemia Panel: No results for input(s): VITAMINB12, FOLATE, FERRITIN, TIBC, IRON, RETICCTPCT in the last 72 hours. Urine analysis:    Component Value Date/Time   COLORURINE YELLOW 05/11/2017 0750   APPEARANCEUR HAZY (A) 05/11/2017 0750   LABSPEC 1.013 05/11/2017 0750   PHURINE 5.0 05/11/2017 0750   GLUCOSEU NEGATIVE 05/11/2017 0750   HGBUR NEGATIVE 05/11/2017 0750   HGBUR negative 08/10/2009 1013   BILIRUBINUR NEGATIVE 05/11/2017 0750   KETONESUR 5 (A) 05/11/2017 0750   PROTEINUR NEGATIVE 05/11/2017 0750   UROBILINOGEN 0.2 10/17/2010 1249   NITRITE NEGATIVE 05/11/2017 0750   LEUKOCYTESUR NEGATIVE 05/11/2017 0750   Sepsis Labs: @LABRCNTIP (procalcitonin:4,lacticidven:4)  )No results found for this or any previous visit (from the past 240 hour(s)).       Radiology Studies: No results found.      Scheduled Meds: . enoxaparin (LOVENOX) injection  40 mg Subcutaneous Q24H  . fluticasone  2 spray Each Nare Daily  . predniSONE  40 mg Oral Q breakfast   Continuous Infusions: . diphenhydrAMINE    . famotidine (PEPCID) IV Stopped (05/13/17 1217)     LOS: 2 days    Time spent: 25 minutes. Greater than 50% of this time was spent in direct contact with the patient coordinating care.     Lelon Frohlich, MD [facility] Pager [phone]  If 7PM-7AM, please contact night-coverage [email or web address] Password [email or web address] 05/13/2017, 3:20 PM

## 2017-05-13 NOTE — Progress Notes (Signed)
  Subjective: Patient feels better.  Minimal mid abdominal pain.  Nausea has almost resolved.  No emesis noted.  Objective: Vital signs in last 24 hours: Temp:  [97.9 F (36.6 C)-98.3 F (36.8 C)] 98.3 F (36.8 C) (03/09 0451) Pulse Rate:  [80-95] 95 (03/09 0451) Resp:  [16] 16 (03/08 2100) BP: (106-119)/(53-66) 114/66 (03/09 0451) SpO2:  [98 %-100 %] 100 % (03/09 0451) Weight:  [213 lb 10 oz (96.9 kg)] 213 lb 10 oz (96.9 kg) (03/09 0451) Last BM Date: 05/11/17  Intake/Output from previous day: 03/08 0701 - 03/09 0700 In: 50 [IV Piggyback:50] Out: -  Intake/Output this shift: No intake/output data recorded.  General appearance: alert, cooperative and no distress GI: Soft, minimal discomfort in the periumbilical region.  No rigidity is noted.  Bowel sounds are active.  Lab Results:  Recent Labs    05/12/17 0740 05/13/17 0657  WBC 8.5 7.0  HGB 12.1 11.5*  HCT 38.1 36.1  PLT 366 403*   BMET Recent Labs    05/11/17 0829 05/13/17 0657  NA 130* 137  K 4.0 3.4*  CL 96* 104  CO2 23 21*  GLUCOSE 118* 80  BUN 7 8  CREATININE 0.74 0.61  CALCIUM 9.2 8.1*   PT/INR No results for input(s): LABPROT, INR in the last 72 hours.  Studies/Results: Ct Abdomen Pelvis W Contrast  Result Date: 05/11/2017 CLINICAL DATA:  47 year old female with acute abdominal and pelvic pain. History of Crohn's disease. EXAM: CT ABDOMEN AND PELVIS WITH CONTRAST TECHNIQUE: Multidetector CT imaging of the abdomen and pelvis was performed using the standard protocol following bolus administration of intravenous contrast. CONTRAST:  110m ISOVUE-300 IOPAMIDOL (ISOVUE-300) INJECTION 61% COMPARISON:  10/17/2010 and prior radiographs FINDINGS: Lower chest: Minimal bibasilar atelectasis noted. Hepatobiliary: The liver is unchanged. No suspicious focal hepatic lesions identified. The patient is status post cholecystectomy. Mild fullness of the intrahepatic biliary system noted. The CBD is normal caliber.  Pancreas: Unremarkable Spleen: Unremarkable Adrenals/Urinary Tract: The kidneys, adrenal glands and bladder are unremarkable except for a RIGHT renal cyst. Stomach/Bowel: There is diffuse wall thickening of the distal small bowel just proximal to the colon measuring 8 cm in length, with adjacent inflammation. There is dilatation of the small bowel proximal to this area compatible with partial small bowel obstruction or ileus. Bowel surgical changes are again noted. Vascular/Lymphatic: No significant vascular findings are present. No enlarged abdominal or pelvic lymph nodes. Reproductive: Uterus and bilateral adnexa are unremarkable. Other: No ascites, abscess or pneumoperitoneum. Musculoskeletal: No acute bony abnormality or suspicious bony lesion. IMPRESSION: 1. Diffuse wall thickening of the distal small bowel with adjacent inflammation (just proximal to the colon and measuring 8 cm in length), compatible with active Crohn's disease/inflammation. Associated partial small bowel obstruction or ileus. No abscess or pneumoperitoneum. Electronically Signed   By: JMargarette CanadaM.D.   On: 05/11/2017 13:46    Anti-infectives: Anti-infectives (From admission, onward)   None      Assessment/Plan: Impression: Small bowel obstruction secondary to recurrent Crohn's disease, resolving. Plan: We will advance to full liquid diet.  No need for acute surgical intervention.  Anticipate discharge in next 24-48 hours.  LOS: 2 days    MAviva Signs3/11/2017

## 2017-05-14 ENCOUNTER — Encounter (HOSPITAL_COMMUNITY)
Admission: RE | Admit: 2017-05-14 | Discharge: 2017-05-14 | Disposition: A | Discharge status: Home / Self Care | Payer: BLUE CROSS/BLUE SHIELD | Source: Ambulatory Visit | Attending: Internal Medicine | Admitting: Internal Medicine

## 2017-05-14 LAB — GLUCOSE, CAPILLARY: Glucose-Capillary: 89 mg/dL (ref 65–99)

## 2017-05-14 MED ORDER — PREDNISONE 10 MG PO TABS: 10.0000 mg | ORAL_TABLET | Freq: Every day | ORAL | 0 refills | Status: DC

## 2017-05-14 MED ORDER — FAMOTIDINE 20 MG PO TABS
20.0000 mg | ORAL_TABLET | Freq: Two times a day (BID) | ORAL | Status: DC
  Administered 2017-05-14 – 2017-05-15 (×2): 20 mg via ORAL
  Filled 2017-05-14 (×2): qty 1

## 2017-05-14 NOTE — Progress Notes (Signed)
Patient began feeling nauseated again before dinner arrived.  MD made aware - will hold DC at this time, patient does not feel comfortable to go home ... MD OK to give zofran early.  Will continue to monitor

## 2017-05-14 NOTE — Progress Notes (Signed)
  Subjective:  Patient feels better.  She says she has mild discomfort in epigastric region.  She did not have pain nausea or vomiting with full liquids yesterday.  She has not had a bowel movement today but she is passing flatus.  Objective: Blood pressure (!) 104/56, pulse 81, temperature 98.1 F (36.7 C), temperature source Oral, resp. rate 16, height 5' 8"  (1.727 m), weight 213 lb 10 oz (96.9 kg), last menstrual period 04/19/2017, SpO2 98 %.  Patient is alert and in no acute distress. Abdomen is full.  Bowel sounds are normal.  On palpation abdomen is soft with mild midepigastric and RLQ tenderness.  No guarding.  No organomegaly or masses.   Labs/studies Results:  Recent Labs    27-May-2017 0740 05/13/17 0657  WBC 8.5 7.0  HGB 12.1 11.5*  HCT 38.1 36.1  PLT 366 403*    BMET  Recent Labs    05/13/17 0657  NA 137  K 3.4*  CL 104  CO2 21*  GLUCOSE 80  BUN 8  CREATININE 0.61  CALCIUM 8.1*     CRP is 6.6(normal up to 1)  Assessment:  #1.  Relapse of all bowel Crohn's disease with stricture proximal to anastomosis.  She also has wall thickening involving the dilated segment.  She is responding to steroid.  Leukocytosis secondary to steroids.  Elevated CRP consistent with diagnosis of flareup of her Crohn's disease.  She is tolerating full liquids.   Recommendations:  Diet can be advanced to low residue diet after patient seen by Dr. Arnoldo Morale. Adalimumab drug and antibody level drawn today. If patient is discharged today will have her follow-up at [place] within 2 weeks.

## 2017-05-14 NOTE — Discharge Summary (Signed)
Physician Discharge Summary  [name] TKT:[id number] DOB: 03/20/1970 DOA: 05/11/2017  PCP: Fayrene Helper, MD  Admit date: 05/11/2017 Discharge date: 05/14/2017  Time spent: 45 minutes  Recommendations for Outpatient Follow-up:  -To be discharged home today. -Advised to follow-up with her gastroenterologist in 2 weeks. -To complete a prolonged prednisone taper.  Discharge Diagnoses:  Principal Problem:   Partial small bowel obstruction (HCC) Active Problems:   Anxiety state   Depression, controlled   Crohn's disease of ileum with complication (HCC)   Periumbilical abdominal pain   Nausea without vomiting   Discharge Condition: Stable and improved  Filed Weights   05/11/17 0748 05/11/17 1703 05/13/17 0451  Weight: 93.9 kg (207 lb) 96.9 kg (213 lb 10 oz) 96.9 kg (213 lb 10 oz)    History of present illness:  As per Dr. Myna Hidalgo on 3/7: [name] is a 47 y.o. female with medical history significant for Crohn's disease status post right hemicolectomy managed with Humira, and depression with anxiety, now presenting to the emergency department with abdominal pain, nausea, and nonbloody diarrhea.  Patient reports that she been in her usual state of health until last night when she developed burning pain localized to the mid abdomen.  This is associated with severe nausea, loss of appetite, and nonbloody diarrhea.  She was unable to sleep last night due to the symptoms.  She had a surveillance colonoscopy in January 2019 with a polyp and erosion noted at her anastomosis, biopsies negative for dysplasia or malignancy.  She denies fevers, chills, chest pain, shortness of breath, or cough.  ED Course: Upon arrival to the ED, patient is found to be afebrile, saturating well on room air, tachycardic in the 110s, and with stable blood pressure.  Chemistry panel is notable for a sodium of 130 and CBC features a leukocytosis to 16,600 and thrombocytosis with platelets 427,000.   Urinalysis is unremarkable.  CT of the abdomen and pelvis reveals diffuse wall thickening of the distal small bowel and adjacent inflammation consistent with Crohn's disease, and associated partial SBO or ileus without abscess or free air.  Patient was treated with 2 L normal saline, IV morphine, and IV Zofran in the ED.  She remains hemodynamically stable, in no apparent respiratory distress, and will be admitted to the medical-surgical unit for IV fluid hydration, correction of electrolyte derangements, serial abdominal exams, and general supportive care for her Crohn's flare.    Hospital Course:   Crohn's disease with acute flareup -Is improved today, tolerating diet -Will discharge on prednisone taper per GI recommendations. -CT also consistent with a partial small bowel obstruction, that seems to have clinicallyresolved  Depression with anxiety -Mood stable.     Procedures:  None  Consultations:  Gastroenterology  Discharge Instructions  Discharge Instructions    Diet - low sodium heart healthy   Complete by:  As directed    Increase activity slowly   Complete by:  As directed      Allergies as of 05/14/2017      Reactions   Feraheme [ferumoxytol] Anaphylaxis   Oxycodone-acetaminophen Itching   Peanut-containing Drug Products    Pecans and walnuts also   Shellfish Allergy    Tramadol Hcl Itching      Medication List    TAKE these medications   acetaminophen 500 MG tablet Commonly known as:  TYLENOL Take 1,000 mg by mouth every 6 (six) hours as needed for mild pain.   acyclovir 400 MG tablet Commonly  known as:  ZOVIRAX TAKE 1 TABLET(400 MG) BY MOUTH THREE TIMES DAILY What changed:  See the new instructions.   Adalimumab 40 MG/0.8ML Pnkt Commonly known as:  HUMIRA PEN Inject 40 mg into the skin every 14 (fourteen) days.   azelastine 0.1 % nasal spray Commonly known as:  ASTELIN Place 2 sprays into both nostrils 2 (two) times daily. Use in each nostril  as directed   B-12 1000 MCG Caps Take 1 capsule by mouth daily.   buPROPion 150 MG 24 hr tablet Commonly known as:  WELLBUTRIN XL Take 1 tablet (150 mg total) by mouth 2 (two) times daily.   calcium-vitamin D 500-200 MG-UNIT tablet Commonly known as:  OSCAL WITH D Take 1 tablet by mouth daily.   cetirizine 10 MG tablet Commonly known as:  ZYRTEC Take 10 mg by mouth daily as needed for allergies.   cyclobenzaprine 10 MG tablet Commonly known as:  FLEXERIL TAKE 1 TABLET(10 MG) BY MOUTH AT BEDTIME   Fish Oil 1200 MG Caps Take 1,200 mg by mouth daily.   fluticasone 50 MCG/ACT nasal spray Commonly known as:  FLONASE SHAKE LIQUID AND USE 2 SPRAYS IN EACH NOSTRIL EVERY DAY   furosemide 20 MG tablet Commonly known as:  LASIX TAKE 1 TABLET(20 MG) BY MOUTH DAILY AS NEEDED FOR SWELLING   iron polysaccharides 150 MG capsule Commonly known as:  NIFEREX Take 1 capsule (150 mg total) by mouth daily.   montelukast 10 MG tablet Commonly known as:  SINGULAIR Take 1 tablet (10 mg total) by mouth at bedtime.   multivitamin with minerals Tabs tablet Take 1 tablet by mouth daily.   omeprazole 20 MG capsule Commonly known as:  PRILOSEC TAKE ONE (1) CAPSULE BY MOUTH TWICE A DAY BEFORE A MEAL   potassium chloride SA 20 MEQ tablet Commonly known as:  K-DUR,KLOR-CON TAKE 1 TABLET TWICE A DAY   predniSONE 10 MG tablet Commonly known as:  DELTASONE Take 1 tablet (10 mg total) by mouth daily with breakfast. Take 4 tablets for 1 week, 3 tablets for 1 week, 2 tablets for 1 week, 1 tablet for 1 week and then discontinue   PROAIR HFA 108 (90 Base) MCG/ACT inhaler Generic drug:  albuterol INHALE 2 PUFFS BY MOUTH EVERY 6 HOURS AS NEEDED FOR WHEEZING   PROBIOTIC PO Take 1 tablet by mouth daily.   SALONPAS ARTHRITIS PAIN RELIEF Pads Apply 1 each topically every 8 (eight) hours as needed (pain). pain   traZODone 100 MG tablet Commonly known as:  DESYREL Take 1 tablet (100 mg total) by  mouth at bedtime.      Allergies  Allergen Reactions  . Feraheme [Ferumoxytol] Anaphylaxis  . Oxycodone-Acetaminophen Itching  . Peanut-Containing Drug Products     Pecans and walnuts also  . Shellfish Allergy   . Tramadol Hcl Itching   Follow-up Information    Fayrene Helper, MD. Schedule an appointment as soon as possible for a visit in 2 week(s).   Specialty:  Family Medicine Contact information: [address] [place] [address] [phone]            The results of significant diagnostics from this hospitalization (including imaging, microbiology, ancillary and laboratory) are listed below for reference.    Significant Diagnostic Studies: Ct Abdomen Pelvis W Contrast  Result Date: 05/11/2017 CLINICAL DATA:  47 year old female with acute abdominal and pelvic pain. History of Crohn's disease. EXAM: CT ABDOMEN AND PELVIS WITH CONTRAST TECHNIQUE: Multidetector CT imaging  of the abdomen and pelvis was performed using the standard protocol following bolus administration of intravenous contrast. CONTRAST:  168m ISOVUE-300 IOPAMIDOL (ISOVUE-300) INJECTION 61% COMPARISON:  10/17/2010 and prior radiographs FINDINGS: Lower chest: Minimal bibasilar atelectasis noted. Hepatobiliary: The liver is unchanged. No suspicious focal hepatic lesions identified. The patient is status post cholecystectomy. Mild fullness of the intrahepatic biliary system noted. The CBD is normal caliber. Pancreas: Unremarkable Spleen: Unremarkable Adrenals/Urinary Tract: The kidneys, adrenal glands and bladder are unremarkable except for a RIGHT renal cyst. Stomach/Bowel: There is diffuse wall thickening of the distal small bowel just proximal to the colon measuring 8 cm in length, with adjacent inflammation. There is dilatation of the small bowel proximal to this area compatible with partial small bowel obstruction or ileus. Bowel surgical changes are again noted. Vascular/Lymphatic: No significant  vascular findings are present. No enlarged abdominal or pelvic lymph nodes. Reproductive: Uterus and bilateral adnexa are unremarkable. Other: No ascites, abscess or pneumoperitoneum. Musculoskeletal: No acute bony abnormality or suspicious bony lesion. IMPRESSION: 1. Diffuse wall thickening of the distal small bowel with adjacent inflammation (just proximal to the colon and measuring 8 cm in length), compatible with active Crohn's disease/inflammation. Associated partial small bowel obstruction or ileus. No abscess or pneumoperitoneum. Electronically Signed   By: JMargarette CanadaM.D.   On: 05/11/2017 13:46    Microbiology: No results found for this or any previous visit (from the past 240 hour(s)).   Labs: Basic Metabolic Panel: Recent Labs  Lab 05/11/17 0829 05/12/17 0740 05/13/17 0657  NA 130*  --  137  K 4.0  --  3.4*  CL 96*  --  104  CO2 23  --  21*  GLUCOSE 118*  --  80  BUN 7  --  8  CREATININE 0.74  --  0.61  CALCIUM 9.2  --  8.1*  MG  --  1.8  --    Liver Function Tests: Recent Labs  Lab 05/11/17 0829  AST 22  ALT 16  ALKPHOS 72  BILITOT 0.5  PROT 8.5*  ALBUMIN 3.5   Recent Labs  Lab 05/11/17 0829  LIPASE 30   No results for input(s): AMMONIA in the last 168 hours. CBC: Recent Labs  Lab 05/11/17 0829 05/12/17 0740 05/13/17 0657  WBC 16.6* 8.5 7.0  NEUTROABS 11.8* 2.6  --   HGB 12.5 12.1 11.5*  HCT 38.5 38.1 36.1  MCV 85.7 87.4 86.6  PLT 427* 366 403*   Cardiac Enzymes: No results for input(s): CKTOTAL, CKMB, CKMBINDEX, TROPONINI in the last 168 hours. BNP: BNP (last 3 results) No results for input(s): BNP in the last 8760 hours.  ProBNP (last 3 results) No results for input(s): PROBNP in the last 8760 hours.  CBG: Recent Labs  Lab 05/12/17 0800 05/13/17 0753 05/13/17 1115 05/14/17 0800  GLUCAP 73 66 90 89       Signed:  [facility] Pager: [phone]/12/2017, 4:50 PM

## 2017-05-14 NOTE — Progress Notes (Signed)
  Subjective: Patient denies any nausea or vomiting.  Seems to be tolerating soft diet well.  Objective: Vital signs in last 24 hours: Temp:  [97.9 F (36.6 C)-98.1 F (36.7 C)] 98.1 F (36.7 C) (03/10 0600) Pulse Rate:  [80-85] 81 (03/10 0600) Resp:  [16-18] 16 (03/10 0600) BP: (104-119)/(48-63) 104/56 (03/10 0600) SpO2:  [98 %-100 %] 98 % (03/10 0600) Last BM Date: 05/13/17  Intake/Output from previous day: 03/09 0701 - 03/10 0700 In: 890 [P.O.:840; IV Piggyback:50] Out: -  Intake/Output this shift: Total I/O In: 50 [IV Piggyback:50] Out: -   General appearance: alert, cooperative and no distress GI: soft, non-tender; bowel sounds normal; no masses,  no organomegaly  Lab Results:  Recent Labs    05/12/17 0740 05/13/17 0657  WBC 8.5 7.0  HGB 12.1 11.5*  HCT 38.1 36.1  PLT 366 403*   BMET Recent Labs    05/13/17 0657  NA 137  K 3.4*  CL 104  CO2 21*  GLUCOSE 80  BUN 8  CREATININE 0.61  CALCIUM 8.1*   PT/INR No results for input(s): LABPROT, INR in the last 72 hours.  Studies/Results: No results found.  Anti-infectives: Anti-infectives (From admission, onward)   None      Assessment/Plan: Impression: Partial small bowel obstruction secondary to Crohn's disease resolving.  No need for acute surgical intervention at this time.  Will sign off.  Patient being followed by GI.  LOS: 3 days    Aviva Signs 05/14/2017

## 2017-05-15 ENCOUNTER — Telehealth: Payer: Self-pay | Admitting: Gastroenterology

## 2017-05-15 LAB — GLUCOSE, CAPILLARY: GLUCOSE-CAPILLARY: 73 mg/dL (ref 65–99)

## 2017-05-15 NOTE — Progress Notes (Signed)
    Subjective: Nausea after eating chicken for lunch yesterday. No vomiting. Worsened over the day and evening. Tolerated grits this morning. Feels better. Soft stool but no diarrhea. Feels ready to go home.   Objective: Vital signs in last 24 hours: Temp:  [97.7 F (36.5 C)-98.2 F (36.8 C)] 97.7 F (36.5 C) (03/11 0530) Pulse Rate:  [81-92] 85 (03/11 0530) Resp:  [16] 16 (03/11 0530) BP: (106-130)/(52-61) 130/61 (03/11 0530) SpO2:  [95 %-99 %] 98 % (03/11 0530) Last BM Date: 05/14/17 General:   Alert and oriented, pleasant Head:  Normocephalic and atraumatic. Abdomen:  Bowel sounds present, soft, non-tender, non-distended. No HSM or hernias noted. No rebound or guarding. No masses appreciated  Msk:  Symmetrical without gross deformities. Normal posture. Neurologic:  Alert and  oriented x4 Psych:  Alert and cooperative. Normal mood and affect.  Intake/Output from previous day: 03/10 0701 - 03/11 0700 In: 1060 [P.O.:960; IV Piggyback:100] Out: -  Intake/Output this shift: No intake/output data recorded.  Lab Results: Recent Labs    05/13/17 0657  WBC 7.0  HGB 11.5*  HCT 36.1  PLT 403*   BMET Recent Labs    05/13/17 0657  NA 137  K 3.4*  CL 104  CO2 21*  GLUCOSE 80  BUN 8  CREATININE 0.61  CALCIUM 8.1*    Assessment: 47 year old female with history of ileocolonic Crohn's disease on Humira, presenting with flare. CT with diffuse wall thickening of distal small bowel just proximal to colon, associated partial SBO. Clinically improved since admission. Discharge yesterday postponed due to nausea, which she relates after eating chicken. She has tolerated her breakfast this morning with resolution of nausea. Appropriate for discharge home with prednisone taper. Will need discussion as outpatient for possible Humira alternative. Humira drug levels and antibody levels ordered while inpatient and pending.   Plan: Appropriate for discharge with prednisone taper Zofran  prn nausea Will arrange outpatient visit in next 2 weeks for follow-up Humira antibody levels drawn and pending   Annitta Needs, PhD, ANP-BC [facility]    LOS: 4 days    05/15/2017, 8:55 AM

## 2017-05-15 NOTE — Care Management Important Message (Signed)
Important Message  Patient Details  Name: VANDA WASKEY MRN: [id number] Date of Birth: [birth year]   Medicare Important Message Given:  Yes    Devera Englander, Chauncey Reading, RN 05/15/2017, 10:24 AM

## 2017-05-15 NOTE — Progress Notes (Signed)
Patient was kept an extra day as she developed nausea after lunch.  She is now improved and has tolerated breakfast this morning.  We will proceed with discharge.  No changes to discharge summary dictated 3/10.  Domingo Mend, MD [facility] Pager: [phone]

## 2017-05-15 NOTE — Telephone Encounter (Signed)
Please arrange outpatient hospital follow-up in 2 weeks with Randall Hiss or myself. (seen by Randall Hiss in hospital in consultation and by me as routine follow-up on day of discharge while in hospital).

## 2017-05-15 NOTE — Telephone Encounter (Signed)
PATIENT SCHEDULED AND NURSE ON FLOOR WILL LET PATIENT KNOW

## 2017-05-18 ENCOUNTER — Other Ambulatory Visit: Payer: Self-pay | Admitting: Family Medicine

## 2017-05-19 LAB — MISC LABCORP TEST (SEND OUT): Labcorp test code: 503890

## 2017-05-22 ENCOUNTER — Ambulatory Visit (INDEPENDENT_AMBULATORY_CARE_PROVIDER_SITE_OTHER): Payer: BLUE CROSS/BLUE SHIELD | Admitting: Family Medicine

## 2017-05-22 ENCOUNTER — Encounter: Payer: Self-pay | Admitting: Family Medicine

## 2017-05-22 VITALS — BP 118/80 | HR 76 | Resp 16 | Ht 68.0 in | Wt 208.0 lb

## 2017-05-22 DIAGNOSIS — R69 Illness, unspecified: Secondary | ICD-10-CM | POA: Diagnosis not present

## 2017-05-22 DIAGNOSIS — J3089 Other allergic rhinitis: Secondary | ICD-10-CM | POA: Diagnosis not present

## 2017-05-22 DIAGNOSIS — K219 Gastro-esophageal reflux disease without esophagitis: Secondary | ICD-10-CM

## 2017-05-22 DIAGNOSIS — R1033 Periumbilical pain: Secondary | ICD-10-CM | POA: Diagnosis not present

## 2017-05-22 DIAGNOSIS — K50019 Crohn's disease of small intestine with unspecified complications: Secondary | ICD-10-CM | POA: Diagnosis not present

## 2017-05-22 DIAGNOSIS — E66811 Obesity, class 1: Secondary | ICD-10-CM

## 2017-05-22 DIAGNOSIS — F329 Major depressive disorder, single episode, unspecified: Secondary | ICD-10-CM | POA: Diagnosis not present

## 2017-05-22 DIAGNOSIS — F32A Depression, unspecified: Secondary | ICD-10-CM

## 2017-05-22 DIAGNOSIS — E669 Obesity, unspecified: Secondary | ICD-10-CM

## 2017-05-22 DIAGNOSIS — D508 Other iron deficiency anemias: Secondary | ICD-10-CM

## 2017-05-22 DIAGNOSIS — F411 Generalized anxiety disorder: Secondary | ICD-10-CM | POA: Diagnosis not present

## 2017-05-22 NOTE — Patient Instructions (Addendum)
Physical exam 9/22  (With pap)or after, call if you need me sooner  Wellness with nurse past due, pls schedule asap  Keep exercise and healthy eating habits up  Do not overdo abdominal wall exercise , this iscausing spasm  CBC, fasting lipid and chem 7 in 5.5 months  Thank you  for choosing [facility]. We consider it a privelige to serve you.  Delivering excellent health care in a caring and  compassionate way is our goal.  Partnering with you,  so that together we can achieve this goal is our strategy.

## 2017-05-22 NOTE — Progress Notes (Signed)
spsa

## 2017-05-22 NOTE — Progress Notes (Signed)
   SEBA MADOLE     MRN: [id number]      DOB: [birth year]   HPI Ms. Boakye is here for follow up and re-evaluation of chronic medical conditions, medication management and review of any available recent lab and radiology data.  Preventive health is updated, specifically  Cancer screening and Immunization.   Questions or concerns regarding consultations or procedures which the PT has had in the interim are  addressed. The PT denies any adverse reactions to current medications since the last visit.   1 day h/o upper abdominal spasm duration 5 mins,eating and drinking normally, no change in BM noted , recently hospitalized for partial SBO but her cu rent symptoms ar no where near  As severe Her also to review and update healthy lifestyle plan and set goals for the upcoming year. She recently re enrolled in exercise program and has set a weight loss target  ROS Denies recent fever or chills. Denies sinus pressure, nasal congestion, ear pain or sore throat. Denies chest congestion, productive cough or wheezing. Denies chest pains, palpitations and leg swelling .   Denies dysuria, frequency, hesitancy or incontinence. Denies joint pain, swelling and limitation in mobility. Denies headaches, seizures, numbness, or tingling. Denies depression, anxiety or insomnia. Denies skin break down or rash.   PE  BP 118/80   Pulse 76   Resp 16   Ht 5' 8"  (1.727 m)   Wt 208 lb (94.3 kg)   SpO2 98%   BMI 31.63 kg/m   Patient alert and oriented and in no cardiopulmonary distress.  HEENT: No facial asymmetry, EOMI,   oropharynx pink and moist.  Neck supple no JVD, no mass.  Chest: Clear to auscultation bilaterally.  CVS: S1, S2 no murmurs, no S3.Regular rate.  ABD: Soft non tender.   Ext: No edema  MS: Adequate ROM spine, shoulders, hips and knees.  Skin: Intact, no ulcerations or rash noted.  Psych: Good eye contact, normal affect. Memory intact not anxious or depressed appearing.  CNS:  CN 2-12 intact, power,  normal throughout.no focal deficits noted.   Assessment & Plan  Depression, controlled Improved and controlled treated by psychiatry, has nearly completed her college degree program , which is wonderful  Anxiety state Controlled, no change in medication   Obesity (BMI 30.0-34.9) Improved, pt applauded on this  Patient re-educated about  the importance of commitment to a  minimum of 150 minutes of exercise per week.  The importance of healthy food choices with portion control discussed.  Goals set by the patient for the next several months.   Weight /BMI 05/22/2017 05/13/2017 05/11/2017  WEIGHT 208 lb 213 lb 10 oz -  HEIGHT 5' 8"  - 5' 8"   BMI 31.63 kg/m2 - 32.48 kg/m2  Some encounter information is confidential and restricted. Go to Review Flowsheets activity to see all data.      Periumbilical abdominal pain Few recent e[pisodes of cramps post hospitalization, exam and hisotry otherwise not suggestive of recurrent SBO  Allergic rhinitis Controlled, no change in medication   Crohn's disease of ileum with complication ([place]) managed by GI and had recent SBO now stable  GERD Controlled, no change in medication

## 2017-05-28 NOTE — Assessment & Plan Note (Signed)
Controlled, no change in medication  

## 2017-05-28 NOTE — Assessment & Plan Note (Signed)
Improved, pt applauded on this  Patient re-educated about  the importance of commitment to a  minimum of 150 minutes of exercise per week.  The importance of healthy food choices with portion control discussed.  Goals set by the patient for the next several months.   Weight /BMI 05/22/2017 05/13/2017 05/11/2017  WEIGHT 208 lb 213 lb 10 oz -  HEIGHT 5' 8"  - 5' 8"   BMI 31.63 kg/m2 - 32.48 kg/m2  Some encounter information is confidential and restricted. Go to Review Flowsheets activity to see all data.

## 2017-05-28 NOTE — Assessment & Plan Note (Signed)
Few recent e[pisodes of cramps post hospitalization, exam and hisotry otherwise not suggestive of recurrent SBO

## 2017-05-28 NOTE — Assessment & Plan Note (Signed)
Improved and controlled treated by psychiatry, has nearly completed her college degree program , which is wonderful

## 2017-05-28 NOTE — Assessment & Plan Note (Signed)
managed by GI and had recent SBO now stable

## 2017-05-31 ENCOUNTER — Ambulatory Visit (INDEPENDENT_AMBULATORY_CARE_PROVIDER_SITE_OTHER): Payer: BLUE CROSS/BLUE SHIELD | Admitting: Gastroenterology

## 2017-05-31 ENCOUNTER — Encounter: Payer: Self-pay | Admitting: Gastroenterology

## 2017-05-31 VITALS — BP 137/82 | HR 106 | Temp 98.7°F | Ht 68.0 in | Wt 213.2 lb

## 2017-05-31 DIAGNOSIS — K5080 Crohn's disease of both small and large intestine without complications: Secondary | ICD-10-CM | POA: Diagnosis not present

## 2017-05-31 MED ORDER — ADALIMUMAB 40 MG/0.8ML ~~LOC~~ AJKT: 40.0000 mg | AUTO-INJECTOR | SUBCUTANEOUS | 3 refills | Status: DC

## 2017-05-31 MED ORDER — HYDROCORTISONE 2.5 % RE CREA: 1.0000 "application " | TOPICAL_CREAM | Freq: Two times a day (BID) | RECTAL | 1 refills | Status: DC

## 2017-05-31 NOTE — Patient Instructions (Signed)
I am checking extra labs in preparation for potentially starting Imuran. I am reviewing this with Dr. Gala Romney. Another idea is to take Humira weekly.   I have sent Anusol cream to [facility] to take twice a day per rectum.  Further recommendations to follow!  It was a pleasure to see you today. I strive to create trusting relationships with patients to provide genuine, compassionate, and quality care. I value your feedback. If you receive a survey regarding your visit,  I greatly appreciate you taking time to fill this out.   Annitta Needs, PhD, ANP-BC [facility]
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
## 2017-06-01 ENCOUNTER — Encounter: Payer: Self-pay | Admitting: Gastroenterology

## 2017-06-01 ENCOUNTER — Telehealth: Payer: Self-pay | Admitting: Internal Medicine

## 2017-06-01 NOTE — Telephone Encounter (Signed)
[phone] patient called and stated that she was supposed to have a steroid called into her pharmacy and it was not done.  Please call her

## 2017-06-01 NOTE — Telephone Encounter (Signed)
LMOM for a return call to find out exactly what she is talking about.

## 2017-06-02 ENCOUNTER — Encounter: Payer: Self-pay | Admitting: Gastroenterology

## 2017-06-02 MED ORDER — HYDROCORTISONE 2.5 % RE CREA: 1.0000 "application " | TOPICAL_CREAM | Freq: Two times a day (BID) | RECTAL | 1 refills | Status: DC

## 2017-06-02 NOTE — Assessment & Plan Note (Signed)
47 year old female with history of ileocolonic Crohn's disease, chronically on Humira and had been doing well until admission for partial SBO with flare in March 2019. Colonoscopy Jan 2019 unable to intubate neoterminal ileum and disease appeared to be overall well-controlled. While inpatient, Humira antibodies and drug level checked, which showed no antibodies but low levels of drug. In this scenario could consider Humira dosing weekly or continuing biweekly dosing and possibly adding Imuran. Will need to discuss further with Dr. Gala Romney. In interim, I am going to go ahead and order TPMT enzymes to have on file. Continue prednisone taper. Further recommendations to follow shortly.

## 2017-06-05 NOTE — Progress Notes (Signed)
CC'D TO PCP °

## 2017-06-06 ENCOUNTER — Encounter: Payer: Self-pay | Admitting: Gastroenterology

## 2017-06-07 ENCOUNTER — Other Ambulatory Visit: Payer: Self-pay | Admitting: Family Medicine

## 2017-06-09 NOTE — Progress Notes (Signed)
Sent in MyChart: Bridgid: I discussed with Dr. Gala Romney. We need to change Humira to weekly dosing, if you are agreeable to this. We will need to check the labs again (the detection of Humira and any antibodies) in 3 months. It will need to be 24 hours before a dose, so we have a true "trough" of the drug level in your body. Are you agreeable to every week dosing?

## 2017-06-14 NOTE — Telephone Encounter (Signed)
Spoke with pt today waiting on a response from [facility].

## 2017-06-15 ENCOUNTER — Other Ambulatory Visit: Payer: Self-pay | Admitting: Family Medicine

## 2017-06-15 NOTE — Progress Notes (Signed)
[facility] labs that check Humira trough level and antibodies. It will need to be drawn 24 hours prior to the next dose. Where do I sent prescription for weekly Humira?

## 2017-06-20 ENCOUNTER — Other Ambulatory Visit: Payer: Self-pay | Admitting: Gastroenterology

## 2017-06-20 MED ORDER — ADALIMUMAB 40 MG/0.8ML ~~LOC~~ AJKT: 40.0000 mg | AUTO-INJECTOR | SUBCUTANEOUS | 3 refills | Status: DC

## 2017-06-20 NOTE — Progress Notes (Unsigned)
Humira weekly injections (new rx) sent to pharmacy.

## 2017-06-20 NOTE — Progress Notes (Signed)
I sent in weekly Humira to her pharmacy. 3 months after starting weekly dosing, we will need to order the [facility] labs. She is coming in July. We can give it to her then, and she will have them drawn on a Tuesday before her dose the next day.

## 2017-06-27 ENCOUNTER — Other Ambulatory Visit: Payer: Self-pay | Admitting: Family Medicine

## 2017-07-01 ENCOUNTER — Other Ambulatory Visit: Payer: Self-pay | Admitting: Family Medicine

## 2017-07-11 ENCOUNTER — Other Ambulatory Visit: Payer: Self-pay | Admitting: Family Medicine

## 2017-07-17 ENCOUNTER — Ambulatory Visit (INDEPENDENT_AMBULATORY_CARE_PROVIDER_SITE_OTHER): Payer: BLUE CROSS/BLUE SHIELD

## 2017-07-17 ENCOUNTER — Other Ambulatory Visit (HOSPITAL_COMMUNITY): Payer: Self-pay

## 2017-07-17 VITALS — BP 122/80 | HR 90 | Resp 16 | Ht 68.0 in | Wt 215.0 lb

## 2017-07-17 DIAGNOSIS — D509 Iron deficiency anemia, unspecified: Secondary | ICD-10-CM

## 2017-07-17 DIAGNOSIS — Z0000 Encounter for general adult medical examination without abnormal findings: Secondary | ICD-10-CM

## 2017-07-17 MED ORDER — POLYSACCHARIDE IRON COMPLEX 150 MG PO CAPS: 150.0000 mg | ORAL_CAPSULE | Freq: Every day | ORAL | 6 refills | Status: DC

## 2017-07-17 MED ORDER — POLYSACCHARIDE IRON COMPLEX 150 MG PO CAPS: 150.0000 mg | ORAL_CAPSULE | Freq: Every day | ORAL | 11 refills | Status: DC

## 2017-07-17 NOTE — Telephone Encounter (Signed)
Received refill request from patients pharmacy for po Iron. Reviewed with provider, chart checked and refilled.
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
## 2017-07-17 NOTE — Patient Instructions (Signed)
[name] , Thank you for taking time to come for your Medicare Wellness Visit. I appreciate your ongoing commitment to your health goals. Please review the following plan we discussed and let me know if I can assist you in the future.   Screening recommendations/referrals: Colonoscopy: complete  Mammogram: due 10/01/2017 Bone Density: not eligible  Recommended yearly ophthalmology/optometry visit for glaucoma screening and checkup Recommended yearly dental visit for hygiene and checkup  Vaccinations: Influenza vaccine: Done  Pneumococcal vaccine: done  Tdap vaccine: up to date Shingles vaccine: not eligible    Advanced directives: yes- discussed and form given   Conditions/risks identified: yes   Next appointment: to be scheduled   Preventive Care 40-64 Years, Female Preventive care refers to lifestyle choices and visits with your health care provider that can promote health and wellness. What does preventive care include?  A yearly physical exam. This is also called an annual well check.  Dental exams once or twice a year.  Routine eye exams. Ask your health care provider how often you should have your eyes checked.  Personal lifestyle choices, including:  Daily care of your teeth and gums.  Regular physical activity.  Eating a healthy diet.  Avoiding tobacco and drug use.  Limiting alcohol use.  Practicing safe sex.  Taking low-dose aspirin daily starting at age 28.  Taking vitamin and mineral supplements as recommended by your health care provider. What happens during an annual well check? The services and screenings done by your health care provider during your annual well check will depend on your age, overall health, lifestyle risk factors, and family history of disease. Counseling  Your health care provider may ask you questions about your:  Alcohol use.  Tobacco use.  Drug use.  Emotional well-being.  Home and relationship well-being.  Sexual  activity.  Eating habits.  Work and work Statistician.  Method of birth control.  Menstrual cycle.  Pregnancy history. Screening  You may have the following tests or measurements:  Height, weight, and BMI.  Blood pressure.  Lipid and cholesterol levels. These may be checked every 5 years, or more frequently if you are over 1 years old.  Skin check.  Lung cancer screening. You may have this screening every year starting at age 6 if you have a 30-pack-year history of smoking and currently smoke or have quit within the past 15 years.  Fecal occult blood test (FOBT) of the stool. You may have this test every year starting at age 34.  Flexible sigmoidoscopy or colonoscopy. You may have a sigmoidoscopy every 5 years or a colonoscopy every 10 years starting at age 59.  Hepatitis C blood test.  Hepatitis B blood test.  Sexually transmitted disease (STD) testing.  Diabetes screening. This is done by checking your blood sugar (glucose) after you have not eaten for a while (fasting). You may have this done every 1-3 years.  Mammogram. This may be done every 1-2 years. Talk to your health care provider about when you should start having regular mammograms. This may depend on whether you have a family history of breast cancer.  BRCA-related cancer screening. This may be done if you have a family history of breast, ovarian, tubal, or peritoneal cancers.  Pelvic exam and Pap test. This may be done every 3 years starting at age 75. Starting at age 4, this may be done every 5 years if you have a Pap test in combination with an HPV test.  Bone density scan. This is  done to screen for osteoporosis. You may have this scan if you are at high risk for osteoporosis. Discuss your test results, treatment options, and if necessary, the need for more tests with your health care provider. Vaccines  Your health care provider may recommend certain vaccines, such as:  Influenza vaccine. This is  recommended every year.  Tetanus, diphtheria, and acellular pertussis (Tdap, Td) vaccine. You may need a Td booster every 10 years.  Zoster vaccine. You may need this after age 18.  Pneumococcal 13-valent conjugate (PCV13) vaccine. You may need this if you have certain conditions and were not previously vaccinated.  Pneumococcal polysaccharide (PPSV23) vaccine. You may need one or two doses if you smoke cigarettes or if you have certain conditions. Talk to your health care provider about which screenings and vaccines you need and how often you need them. This information is not intended to replace advice given to you by your health care provider. Make sure you discuss any questions you have with your health care provider. Document Released: 03/20/2015 Document Revised: 11/11/2015 Document Reviewed: 12/23/2014 Elsevier Interactive Patient Education  2017 [place] Prevention in the Home Falls can cause injuries. They can happen to people of all ages. There are many things you can do to make your home safe and to help prevent falls. What can I do on the outside of my home?  Regularly fix the edges of walkways and driveways and fix any cracks.  Remove anything that might make you trip as you walk through a door, such as a raised step or threshold.  Trim any bushes or trees on the path to your home.  Use bright outdoor lighting.  Clear any walking paths of anything that might make someone trip, such as rocks or tools.  Regularly check to see if handrails are loose or broken. Make sure that both sides of any steps have handrails.  Any raised decks and porches should have guardrails on the edges.  Have any leaves, snow, or ice cleared regularly.  Use sand or salt on walking paths during winter.  Clean up any spills in your garage right away. This includes oil or grease spills. What can I do in the bathroom?  Use night lights.  Install grab bars by the toilet and in  the tub and shower. Do not use towel bars as grab bars.  Use non-skid mats or decals in the tub or shower.  If you need to sit down in the shower, use a plastic, non-slip stool.  Keep the floor dry. Clean up any water that spills on the floor as soon as it happens.  Remove soap buildup in the tub or shower regularly.  Attach bath mats securely with double-sided non-slip rug tape.  Do not have throw rugs and other things on the floor that can make you trip. What can I do in the bedroom?  Use night lights.  Make sure that you have a light by your bed that is easy to reach.  Do not use any sheets or blankets that are too big for your bed. They should not hang down onto the floor.  Have a firm chair that has side arms. You can use this for support while you get dressed.  Do not have throw rugs and other things on the floor that can make you trip. What can I do in the kitchen?  Clean up any spills right away.  Avoid walking on wet floors.  Keep items  that you use a lot in easy-to-reach places.  If you need to reach something above you, use a strong step stool that has a grab bar.  Keep electrical cords out of the way.  Do not use floor polish or wax that makes floors slippery. If you must use wax, use non-skid floor wax.  Do not have throw rugs and other things on the floor that can make you trip. What can I do with my stairs?  Do not leave any items on the stairs.  Make sure that there are handrails on both sides of the stairs and use them. Fix handrails that are broken or loose. Make sure that handrails are as long as the stairways.  Check any carpeting to make sure that it is firmly attached to the stairs. Fix any carpet that is loose or worn.  Avoid having throw rugs at the top or bottom of the stairs. If you do have throw rugs, attach them to the floor with carpet tape.  Make sure that you have a light switch at the top of the stairs and the bottom of the stairs. If  you do not have them, ask someone to add them for you. What else can I do to help prevent falls?  Wear shoes that:  Do not have high heels.  Have rubber bottoms.  Are comfortable and fit you well.  Are closed at the toe. Do not wear sandals.  If you use a stepladder:  Make sure that it is fully opened. Do not climb a closed stepladder.  Make sure that both sides of the stepladder are locked into place.  Ask someone to hold it for you, if possible.  Clearly mark and make sure that you can see:  Any grab bars or handrails.  First and last steps.  Where the edge of each step is.  Use tools that help you move around (mobility aids) if they are needed. These include:  Canes.  Walkers.  Scooters.  Crutches.  Turn on the lights when you go into a dark area. Replace any light bulbs as soon as they burn out.  Set up your furniture so you have a clear path. Avoid moving your furniture around.  If any of your floors are uneven, fix them.  If there are any pets around you, be aware of where they are.  Review your medicines with your doctor. Some medicines can make you feel dizzy. This can increase your chance of falling. Ask your doctor what other things that you can do to help prevent falls. This information is not intended to replace advice given to you by your health care provider. Make sure you discuss any questions you have with your health care provider. Document Released: 12/18/2008 Document Revised: 07/30/2015 Document Reviewed: 03/28/2014 Elsevier Interactive Patient Education  2017 [employer].

## 2017-07-18 ENCOUNTER — Other Ambulatory Visit: Payer: Self-pay | Admitting: Family Medicine

## 2017-07-18 ENCOUNTER — Telehealth: Payer: Self-pay

## 2017-07-18 NOTE — Telephone Encounter (Signed)
Called [facility] to complete a PA for Humira 21m/.8ml. Pt needed two Pa's completed, 1 for the Humira and 2 for the qty. Both PA's were approved through 06/18/17- 07/17/2020, Case number [id number] Case number [id number] LMOM, pt can contact [facility] for shipment at [phone]

## 2017-07-22 ENCOUNTER — Other Ambulatory Visit: Payer: Self-pay | Admitting: Family Medicine

## 2017-07-24 ENCOUNTER — Ambulatory Visit: Payer: Self-pay

## 2017-08-02 ENCOUNTER — Encounter: Payer: Self-pay | Admitting: Gastroenterology

## 2017-08-02 MED ORDER — ADALIMUMAB 40 MG/0.8ML ~~LOC~~ AJKT
40.0000 mg | AUTO-INJECTOR | SUBCUTANEOUS | 3 refills | Status: DC
Start: 2017-08-02 — End: 2018-03-15

## 2017-08-12 ENCOUNTER — Other Ambulatory Visit: Payer: Self-pay | Admitting: Family Medicine

## 2017-08-25 ENCOUNTER — Encounter: Payer: Self-pay | Admitting: Family Medicine

## 2017-08-31 ENCOUNTER — Other Ambulatory Visit: Payer: Self-pay | Admitting: Family Medicine

## 2017-08-31 DIAGNOSIS — Z1231 Encounter for screening mammogram for malignant neoplasm of breast: Secondary | ICD-10-CM

## 2017-09-08 ENCOUNTER — Encounter: Payer: Self-pay | Admitting: Family Medicine

## 2017-09-13 ENCOUNTER — Other Ambulatory Visit: Payer: Self-pay | Admitting: Family Medicine

## 2017-09-18 ENCOUNTER — Other Ambulatory Visit: Payer: Self-pay | Admitting: Family Medicine

## 2017-09-20 ENCOUNTER — Ambulatory Visit (INDEPENDENT_AMBULATORY_CARE_PROVIDER_SITE_OTHER): Payer: BLUE CROSS/BLUE SHIELD | Admitting: Gastroenterology

## 2017-09-20 ENCOUNTER — Encounter: Payer: Self-pay | Admitting: Gastroenterology

## 2017-09-20 VITALS — BP 139/82 | HR 94 | Temp 97.4°F | Ht 67.5 in | Wt 217.8 lb

## 2017-09-20 DIAGNOSIS — K50818 Crohn's disease of both small and large intestine with other complication: Secondary | ICD-10-CM

## 2017-09-20 NOTE — Progress Notes (Signed)
Primary Care Physician: Fayrene Helper, MD  Primary Gastroenterologist:  Garfield Cornea, MD   Chief Complaint  Patient presents with  . Crohn's Disease    abd pain yesterday    HPI: [name] is a 47 y.o. female with history of ileocolonic Crohn's disease, here for follow-up.  Last colonoscopy in Jan 2019 unable to intubate the distal neoterminal ileum, status post right hemicolectomy, a single erosion polyp at the anastomosis status post biopsy. Surgical pathology found the polyp to be benign and the ascending colon biopsy was ulcer and granulation tissues. Overall impression of well-controlled Crohn's disease.  March 2019 inpatient for partial small bowel obstruction and Crohn's flare, CT findings of diffuse wall thickening of the distal small bowel (measuring up to 8cm) just proximal to the colon.  Humira antibodies and drug level checked which showed no antibodies but low levels of drugs (2.5ug/mL).  Patient was started with weekly Humira in April.  Clinically she feels good except she ate something yesterday that has not seemed to agree with her.  She has had some diarrhea and abdominal pain.  Today she feels okay.  She denies any blood in the stool recently.  Appetite is good.  No vomiting.  Heartburn well controlled.  No concerns.  She is supposed to take her Humira today.  Current Outpatient Medications  Medication Sig Dispense Refill  . acetaminophen (TYLENOL) 500 MG tablet Take 1,000 mg by mouth every 6 (six) hours as needed for mild pain.    Marland Kitchen acyclovir (ZOVIRAX) 400 MG tablet TAKE 1 TABLET(400 MG) BY MOUTH THREE TIMES DAILY (Patient taking differently: TAKE 1 TABLET(400 MG) BY MOUTH THREE TIMES DAILY prn flare ups) 21 tablet 0  . acyclovir (ZOVIRAX) 400 MG tablet TAKE 1 TABLET(400 MG) BY MOUTH THREE TIMES DAILY (Patient taking differently: TAKE 1 TABLET(400 MG) BY MOUTH THREE TIMES DAILY. Takes as needed) 21 tablet 0  . Adalimumab (HUMIRA PEN) 40 MG/0.8ML PNKT  Inject 40 mg into the skin every 7 (seven) days. 4 each 3  . azelastine (ASTELIN) 0.1 % nasal spray Place 2 sprays into both nostrils 2 (two) times daily. Use in each nostril as directed 30 mL 12  . buPROPion (WELLBUTRIN XL) 150 MG 24 hr tablet Take 1 tablet (150 mg total) by mouth 2 (two) times daily. 180 tablet 3  . calcium-vitamin D (OSCAL WITH D) 500-200 MG-UNIT tablet Take 1 tablet by mouth daily.    . cetirizine (ZYRTEC) 10 MG tablet Take 10 mg by mouth daily as needed for allergies.     . Cyanocobalamin (B-12) 1000 MCG CAPS Take 1 capsule by mouth daily.      . cyclobenzaprine (FLEXERIL) 10 MG tablet TAKE 1 TABLET(10 MG) BY MOUTH AT BEDTIME 30 tablet 0  . fluticasone (FLONASE) 50 MCG/ACT nasal spray SHAKE LIQUID AND USE 2 SPRAYS IN EACH NOSTRIL EVERY DAY 16 g 2  . furosemide (LASIX) 20 MG tablet TAKE 1 TABLET(20 MG) BY MOUTH DAILY AS NEEDED FOR SWELLING 30 tablet 4  . hydrocortisone (ANUSOL-HC) 2.5 % rectal cream Place 1 application rectally 2 (two) times daily. 30 g 1  . iron polysaccharides (NIFEREX) 150 MG capsule Take 1 capsule (150 mg total) by mouth daily. 30 capsule 6  . Liniments (SALONPAS ARTHRITIS PAIN RELIEF) PADS Apply 1 each topically every 8 (eight) hours as needed (pain). pain     . montelukast (SINGULAIR) 10 MG tablet TAKE 1 TABLET(10 MG) BY MOUTH AT BEDTIME 30 tablet 3  .  Multiple Vitamin (MULTIVITAMIN WITH MINERALS) TABS tablet Take 1 tablet by mouth daily.    . Omega-3 Fatty Acids (FISH OIL) 1200 MG CAPS Take 1,200 mg by mouth daily.     Marland Kitchen omeprazole (PRILOSEC) 20 MG capsule TAKE ONE (1) CAPSULE BY MOUTH TWICE A DAY BEFORE A MEAL 180 capsule 3  . potassium chloride SA (K-DUR,KLOR-CON) 20 MEQ tablet TAKE 1 TABLET TWICE A DAY 180 tablet 0  . PROAIR HFA 108 (90 Base) MCG/ACT inhaler INHALE 2 PUFFS BY MOUTH EVERY 6 HOURS AS NEEDED FOR WHEEZING 8.5 g 0  . Probiotic Product (PROBIOTIC PO) Take 1 tablet by mouth daily.    . traZODone (DESYREL) 100 MG tablet Take 1 tablet (100 mg  total) by mouth at bedtime. 90 tablet 3   No current facility-administered medications for this visit.     Allergies as of 09/20/2017 - Review Complete 09/20/2017  Allergen Reaction Noted  . Feraheme [ferumoxytol] Anaphylaxis 10/06/2015  . Oxycodone-acetaminophen Itching 12/29/2005  . Peanut-containing drug products  03/22/2011  . Shellfish allergy  03/22/2011  . Tramadol hcl Itching     ROS:  General: Negative for anorexia, weight loss, fever, chills, fatigue, weakness.  Weight is up 7 pounds since January. ENT: Negative for hoarseness, difficulty swallowing , nasal congestion. CV: Negative for chest pain, angina, palpitations, dyspnea on exertion, peripheral edema.  Respiratory: Negative for dyspnea at rest, dyspnea on exertion, cough, sputum, wheezing.  GI: See history of present illness. GU:  Negative for dysuria, hematuria, urinary incontinence, urinary frequency, nocturnal urination.  Endo: Negative for unusual weight change.    Physical Examination:   BP 139/82   Pulse 94   Temp (!) 97.4 F (36.3 C) (Oral)   Ht 5' 7.5" (1.715 m)   Wt 217 lb 12.8 oz (98.8 kg)   LMP 09/15/2017 (Approximate)   BMI 33.61 kg/m   General: Well-nourished, well-developed in no acute distress.  Eyes: No icterus. Mouth: Oropharyngeal mucosa moist and pink , no lesions erythema or exudate. Lungs: Clear to auscultation bilaterally.  Heart: Regular rate and rhythm, no murmurs rubs or gallops.  Abdomen: Bowel sounds are normal, nontender, nondistended, no hepatosplenomegaly or masses, no abdominal bruits or hernia , no rebound or guarding.   Extremities: No lower extremity edema. No clubbing or deformities. Neuro: Alert and oriented x 4   Skin: Warm and dry, no jaundice.   Psych: Alert and cooperative, normal mood and affect.  Labs:  Lab Results  Component Value Date   CREATININE 0.61 05/13/2017   BUN 8 05/13/2017   NA 137 05/13/2017   K 3.4 (L) 05/13/2017   CL 104 05/13/2017   CO2 21  (L) 05/13/2017   Lab Results  Component Value Date   ALT 16 05/11/2017   AST 22 05/11/2017   ALKPHOS 72 05/11/2017   BILITOT 0.5 05/11/2017   Lab Results  Component Value Date   WBC 7.0 05/13/2017   HGB 11.5 (L) 05/13/2017   HCT 36.1 05/13/2017   MCV 86.6 05/13/2017   PLT 403 (H) 05/13/2017    Imaging Studies: No results found.

## 2017-09-20 NOTE — Assessment & Plan Note (Signed)
47 year old female with history of ileocolonic Crohn's disease, most recent complication was in March admission for partial small bowel obstruction.  Long segment segment is wall thickening of the distal small bowel just proximal to the colon.  Colonoscopy in January with minimal colon changes but unfortunately the distal neoterminal ileum could not be intubated.  Therefore the area of concern on CT in March was not seen at time of colonoscopy in January.   Due to low Humira levels her dose was adjusted in April to 40 mg q. weekly.  She is due for trough levels and antibody check.  I have recommended she get labs today prior to her dose of Humira this evening.  If she is not able to do this and she needs to wait until next week, have labs drawn less than 24 hours prior to next dose.

## 2017-09-20 NOTE — Patient Instructions (Addendum)
1. Please go to [facility] to get your labs done today. We will be in touch with results as available, it can take up to 10 business days.  2. Call or send Mychart message if you have any questions or concerns! 3. We will see you back in 6 months.

## 2017-09-21 NOTE — Progress Notes (Signed)
cc'ed to pcp °

## 2017-09-25 ENCOUNTER — Other Ambulatory Visit: Payer: Self-pay | Admitting: Family Medicine

## 2017-09-26 LAB — CBC WITH DIFFERENTIAL/PLATELET
BASOS: 1 %
Basophils Absolute: 0.1 10*3/uL (ref 0.0–0.2)
EOS (ABSOLUTE): 0.6 10*3/uL — AB (ref 0.0–0.4)
Eos: 4 %
HEMATOCRIT: 38.5 % (ref 34.0–46.6)
HEMOGLOBIN: 12.2 g/dL (ref 11.1–15.9)
IMMATURE GRANS (ABS): 0 10*3/uL (ref 0.0–0.1)
Immature Granulocytes: 0 %
Lymphocytes Absolute: 6.7 10*3/uL — ABNORMAL HIGH (ref 0.7–3.1)
Lymphs: 47 %
MCH: 27.8 pg (ref 26.6–33.0)
MCHC: 31.7 g/dL (ref 31.5–35.7)
MCV: 88 fL (ref 79–97)
MONOCYTES: 6 %
Monocytes Absolute: 0.8 10*3/uL (ref 0.1–0.9)
NEUTROS ABS: 5.8 10*3/uL (ref 1.4–7.0)
Neutrophils: 42 %
Platelets: 444 10*3/uL (ref 150–450)
RBC: 4.39 x10E6/uL (ref 3.77–5.28)
RDW: 14.8 % (ref 12.3–15.4)
WBC: 14 10*3/uL — ABNORMAL HIGH (ref 3.4–10.8)

## 2017-09-26 LAB — IRON,TIBC AND FERRITIN PANEL
FERRITIN: 165 ng/mL — AB (ref 15–150)
IRON SATURATION: 26 % (ref 15–55)
IRON: 65 ug/dL (ref 27–159)
Total Iron Binding Capacity: 253 ug/dL (ref 250–450)
UIBC: 188 ug/dL (ref 131–425)

## 2017-09-26 LAB — ADALIMUMAB+AB (SERIAL MONITOR)
Adalimumab Drug Level: 4.6 ug/mL
Anti-Adalimumab Antibody: <25 ng/mL

## 2017-09-27 DIAGNOSIS — G4733 Obstructive sleep apnea (adult) (pediatric): Secondary | ICD-10-CM | POA: Diagnosis not present

## 2017-09-28 ENCOUNTER — Other Ambulatory Visit: Payer: Self-pay | Admitting: Family Medicine

## 2017-09-28 ENCOUNTER — Ambulatory Visit (HOSPITAL_COMMUNITY)
Admission: RE | Admit: 2017-09-28 | Discharge: 2017-09-28 | Disposition: A | Discharge status: Home / Self Care | Payer: BLUE CROSS/BLUE SHIELD | Source: Ambulatory Visit | Attending: Family Medicine | Admitting: Family Medicine

## 2017-09-28 DIAGNOSIS — Z1231 Encounter for screening mammogram for malignant neoplasm of breast: Secondary | ICD-10-CM | POA: Diagnosis not present

## 2017-09-29 ENCOUNTER — Other Ambulatory Visit: Payer: Self-pay | Admitting: Family Medicine

## 2017-10-12 ENCOUNTER — Encounter: Payer: Self-pay | Admitting: Family Medicine

## 2017-10-15 ENCOUNTER — Other Ambulatory Visit: Payer: Self-pay | Admitting: Family Medicine

## 2017-10-16 ENCOUNTER — Other Ambulatory Visit: Payer: Self-pay | Admitting: Family Medicine

## 2017-10-17 ENCOUNTER — Ambulatory Visit (INDEPENDENT_AMBULATORY_CARE_PROVIDER_SITE_OTHER): Payer: BLUE CROSS/BLUE SHIELD | Admitting: Family Medicine

## 2017-10-17 ENCOUNTER — Encounter: Payer: Self-pay | Admitting: Family Medicine

## 2017-10-17 VITALS — BP 112/80 | HR 99 | Resp 16 | Ht 67.5 in | Wt 220.0 lb

## 2017-10-17 DIAGNOSIS — R7301 Impaired fasting glucose: Secondary | ICD-10-CM

## 2017-10-17 DIAGNOSIS — D508 Other iron deficiency anemias: Secondary | ICD-10-CM

## 2017-10-17 DIAGNOSIS — L03818 Cellulitis of other sites: Secondary | ICD-10-CM

## 2017-10-17 DIAGNOSIS — Z13220 Encounter for screening for lipoid disorders: Secondary | ICD-10-CM

## 2017-10-17 DIAGNOSIS — E669 Obesity, unspecified: Secondary | ICD-10-CM | POA: Diagnosis not present

## 2017-10-17 DIAGNOSIS — F32A Depression, unspecified: Secondary | ICD-10-CM

## 2017-10-17 DIAGNOSIS — F329 Major depressive disorder, single episode, unspecified: Secondary | ICD-10-CM

## 2017-10-17 MED ORDER — FLUCONAZOLE 150 MG PO TABS: 150.0000 mg | ORAL_TABLET | Freq: Once | ORAL | 0 refills | Status: AC

## 2017-10-17 MED ORDER — CEPHALEXIN 500 MG PO CAPS: 500.0000 mg | ORAL_CAPSULE | Freq: Three times a day (TID) | ORAL | 0 refills | Status: DC

## 2017-10-17 NOTE — Patient Instructions (Addendum)
Keep follow up for pap as scheduled  Please collect lab order for fasting lipid,  TSH and HBA1C and   chem 7 and EGFr ( [facility]) , draw 5 to 7 days before appointment  Weight loss goal of 2 pounds per month  5 days of antibiotics are prescribed and fluconazole , 1 tablet is prescribed for use if needed  Continue exercise commitment  Please continue  Choosing green food, fruit and vegetables and water  Thank you  for choosing [facility]. We consider it a privelige to serve you.  Delivering excellent health care in a caring and  compassionate way is our goal.  Partnering with you,  so that together we can achieve this goal is our strategy.

## 2017-10-17 NOTE — Progress Notes (Signed)
   [name]     MRN: [id number]      DOB: [birth year]   HPI [name] is here c/o sore on upper lip, wh x 6 days, states initially was a blister, and it burst on its own She had her humira the day before, this ,she has been taking for 10 years , no fever or chills ROS Denies recent fever or chills. Denies sinus pressure, nasal congestion, ear pain or sore throat. Denies chest congestion, productive cough or wheezing. Denies chest pains, palpitations and leg swelling Denies abdominal pain, nausea, vomiting,diarrhea or constipation.   Denies dysuria, frequency, hesitancy or incontinence. Denies joint pain, swelling and limitation in mobility. Denies headaches, seizures, numbness, or tingling. Denies depression, anxiety or insomnia.  PE  BP 112/80   Pulse 99   Resp 16   Ht 5' 7.5" (1.715 m)   Wt 220 lb (99.8 kg)   SpO2 95%   BMI 33.95 kg/m   Patient alert and oriented and in no cardiopulmonary distress.  HEENT: No facial asymmetry, EOMI,   oropharynx pink and moist.  Neck supple no JVD, no mass.upper lip is swollen and mildly erythematous  Chest: Clear to auscultation bilaterally.  CVS: S1, S2 no murmurs, no S3.Regular rate.  ABD: Soft non tender.   Ext: No edema  MS: Adequate ROM spine, shoulders, hips and knees.  Skin: erythema and mild swelling of upper lip, no ulceration noted  Psych: Good eye contact, normal affect. Memory intact not anxious or depressed appearing.  CNS: CN 2-12 intact, power,  normal throughout.no focal deficits noted.   Assessment & Plan  Cellulitis Mild Cellulitis of upper lip, no skin breakdown, antibiotic course prescribed for 5 days with fluconazole for se if needed I assured the patient that the inflammation was not due to the humira  Obesity (BMI 30.0-34.9) Deteriorated. Patient re-educated about  the importance of commitment to a  minimum of 150 minutes of exercise per week.  The importance of healthy food choices with portion  control discussed. Encouraged to start a food diary, count calories and to consider  joining a support group. Sample diet sheets offered. Goals set by the patient for the next several months.   Weight /BMI 10/17/2017 09/20/2017 07/17/2017  WEIGHT 220 lb 217 lb 12.8 oz 215 lb  HEIGHT 5' 7.5" 5' 7.5" 5' 8"   BMI 33.95 kg/m2 33.61 kg/m2 32.69 kg/m2  Some encounter information is confidential and restricted. Go to Review Flowsheets activity to see all data.      Iron deficiency anemia Updated lab needed at/ before next visit.   Depression, controlled Controlled, no change in medication Managed by psychiatry

## 2017-10-21 ENCOUNTER — Encounter: Payer: Self-pay | Admitting: Family Medicine

## 2017-10-21 ENCOUNTER — Other Ambulatory Visit: Payer: Self-pay | Admitting: Family Medicine

## 2017-10-21 DIAGNOSIS — L0390 Cellulitis, unspecified: Secondary | ICD-10-CM | POA: Insufficient documentation

## 2017-10-21 NOTE — Assessment & Plan Note (Signed)
Updated lab needed at/ before next visit.   

## 2017-10-21 NOTE — Assessment & Plan Note (Signed)
Deteriorated. Patient re-educated about  the importance of commitment to a  minimum of 150 minutes of exercise per week.  The importance of healthy food choices with portion control discussed. Encouraged to start a food diary, count calories and to consider  joining a support group. Sample diet sheets offered. Goals set by the patient for the next several months.   Weight /BMI 10/17/2017 09/20/2017 07/17/2017  WEIGHT 220 lb 217 lb 12.8 oz 215 lb  HEIGHT 5' 7.5" 5' 7.5" 5' 8"   BMI 33.95 kg/m2 33.61 kg/m2 32.69 kg/m2  Some encounter information is confidential and restricted. Go to Review Flowsheets activity to see all data.

## 2017-10-21 NOTE — Assessment & Plan Note (Addendum)
Mild Cellulitis of upper lip, no skin breakdown, antibiotic course prescribed for 5 days with fluconazole for se if needed I assured the patient that the inflammation was not due to the humira

## 2017-10-21 NOTE — Assessment & Plan Note (Signed)
Controlled, no change in medication Managed by psychiatry

## 2017-10-25 ENCOUNTER — Other Ambulatory Visit (HOSPITAL_COMMUNITY): Payer: Self-pay | Admitting: Psychiatry

## 2017-10-28 ENCOUNTER — Emergency Department (HOSPITAL_COMMUNITY): Payer: BLUE CROSS/BLUE SHIELD

## 2017-10-28 ENCOUNTER — Emergency Department (HOSPITAL_COMMUNITY)
Admission: EM | Admit: 2017-10-28 | Discharge: 2017-10-28 | Disposition: A | Discharge status: Home / Self Care | Payer: BLUE CROSS/BLUE SHIELD | Attending: Emergency Medicine | Admitting: Emergency Medicine

## 2017-10-28 ENCOUNTER — Encounter (HOSPITAL_COMMUNITY): Payer: Self-pay | Admitting: Emergency Medicine

## 2017-10-28 DIAGNOSIS — Z79899 Other long term (current) drug therapy: Secondary | ICD-10-CM | POA: Insufficient documentation

## 2017-10-28 DIAGNOSIS — J45909 Unspecified asthma, uncomplicated: Secondary | ICD-10-CM | POA: Insufficient documentation

## 2017-10-28 DIAGNOSIS — K5090 Crohn's disease, unspecified, without complications: Secondary | ICD-10-CM | POA: Insufficient documentation

## 2017-10-28 DIAGNOSIS — K56600 Partial intestinal obstruction, unspecified as to cause: Secondary | ICD-10-CM | POA: Diagnosis not present

## 2017-10-28 DIAGNOSIS — Z87891 Personal history of nicotine dependence: Secondary | ICD-10-CM | POA: Insufficient documentation

## 2017-10-28 DIAGNOSIS — K50918 Crohn's disease, unspecified, with other complication: Secondary | ICD-10-CM | POA: Diagnosis not present

## 2017-10-28 DIAGNOSIS — K6389 Other specified diseases of intestine: Secondary | ICD-10-CM | POA: Diagnosis not present

## 2017-10-28 DIAGNOSIS — R1033 Periumbilical pain: Secondary | ICD-10-CM | POA: Diagnosis not present

## 2017-10-28 LAB — COMPREHENSIVE METABOLIC PANEL
ALBUMIN: 3.3 g/dL — AB (ref 3.5–5.0)
ALT: 18 U/L (ref 0–44)
ANION GAP: 8 (ref 5–15)
AST: 18 U/L (ref 15–41)
Alkaline Phosphatase: 67 U/L (ref 38–126)
BUN: 6 mg/dL (ref 6–20)
CALCIUM: 8.7 mg/dL — AB (ref 8.9–10.3)
CHLORIDE: 100 mmol/L (ref 98–111)
CO2: 30 mmol/L (ref 22–32)
Creatinine, Ser: 0.74 mg/dL (ref 0.44–1.00)
GFR calc non Af Amer: >60 mL/min (ref >60)
GLUCOSE: 107 mg/dL — AB (ref 70–99)
Potassium: 3.7 mmol/L (ref 3.5–5.1)
SODIUM: 138 mmol/L (ref 135–145)
Total Bilirubin: 0.4 mg/dL (ref 0.3–1.2)
Total Protein: 7.9 g/dL (ref 6.5–8.1)

## 2017-10-28 LAB — URINALYSIS, ROUTINE W REFLEX MICROSCOPIC
Bilirubin Urine: NEGATIVE
GLUCOSE, UA: NEGATIVE mg/dL
Hgb urine dipstick: NEGATIVE
KETONES UR: NEGATIVE mg/dL
LEUKOCYTES UA: NEGATIVE
NITRITE: NEGATIVE
PH: 6 (ref 5.0–8.0)
Protein, ur: NEGATIVE mg/dL
SPECIFIC GRAVITY, URINE: 1.01 (ref 1.005–1.030)

## 2017-10-28 LAB — CBC WITH DIFFERENTIAL/PLATELET
BASOS PCT: 0 %
Basophils Absolute: 0 10*3/uL (ref 0.0–0.1)
EOS ABS: 0 10*3/uL (ref 0.0–0.7)
EOS PCT: 0 %
HCT: 38.9 % (ref 36.0–46.0)
Hemoglobin: 13 g/dL (ref 12.0–15.0)
Lymphocytes Relative: 51 %
Lymphs Abs: 5 10*3/uL — ABNORMAL HIGH (ref 0.7–4.0)
MCH: 28.3 pg (ref 26.0–34.0)
MCHC: 33.4 g/dL (ref 30.0–36.0)
MCV: 84.7 fL (ref 78.0–100.0)
Monocytes Absolute: 1 10*3/uL (ref 0.1–1.0)
Monocytes Relative: 10 %
NEUTROS PCT: 39 %
Neutro Abs: 3.8 10*3/uL (ref 1.7–7.7)
PLATELETS: 436 10*3/uL — AB (ref 150–400)
RBC: 4.59 MIL/uL (ref 3.87–5.11)
RDW: 16.2 % — ABNORMAL HIGH (ref 11.5–15.5)
WBC: 9.7 10*3/uL (ref 4.0–10.5)

## 2017-10-28 MED ORDER — IOPAMIDOL (ISOVUE-300) INJECTION 61%
100.0000 mL | Freq: Once | INTRAVENOUS | Status: AC | PRN
  Administered 2017-10-28: 100 mL via INTRAVENOUS

## 2017-10-28 MED ORDER — SODIUM CHLORIDE 0.9 % IV BOLUS
1000.0000 mL | Freq: Once | INTRAVENOUS | Status: AC
  Administered 2017-10-28: 1000 mL via INTRAVENOUS

## 2017-10-28 MED ORDER — PREDNISONE 50 MG PO TABS
60.0000 mg | ORAL_TABLET | Freq: Once | ORAL | Status: AC
  Administered 2017-10-28: 60 mg via ORAL
  Filled 2017-10-28: qty 1

## 2017-10-28 MED ORDER — HYDROMORPHONE HCL 1 MG/ML IJ SOLN
0.5000 mg | Freq: Once | INTRAMUSCULAR | Status: AC
  Administered 2017-10-28: 0.5 mg via INTRAVENOUS
  Filled 2017-10-28: qty 1

## 2017-10-28 MED ORDER — DIPHENHYDRAMINE HCL 50 MG/ML IJ SOLN
12.5000 mg | Freq: Once | INTRAMUSCULAR | Status: AC
  Administered 2017-10-28: 12.5 mg via INTRAVENOUS
  Filled 2017-10-28: qty 1

## 2017-10-28 MED ORDER — ACYCLOVIR 5 % EX OINT: 1.0000 "application " | TOPICAL_OINTMENT | CUTANEOUS | 0 refills | Status: DC

## 2017-10-28 MED ORDER — IOPAMIDOL (ISOVUE-300) INJECTION 61%
30.0000 mL | Freq: Once | INTRAVENOUS | Status: AC | PRN
  Administered 2017-10-28: 30 mL via ORAL

## 2017-10-28 MED ORDER — METOCLOPRAMIDE HCL 5 MG/ML IJ SOLN
10.0000 mg | Freq: Once | INTRAMUSCULAR | Status: AC
  Administered 2017-10-28: 10 mg via INTRAVENOUS
  Filled 2017-10-28: qty 2

## 2017-10-28 MED ORDER — PREDNISONE 20 MG PO TABS: 60.0000 mg | ORAL_TABLET | Freq: Every day | ORAL | 0 refills | Status: AC

## 2017-10-28 NOTE — ED Triage Notes (Signed)
Pt reports abd pain since yesterday.  Took Humira on Wed for Crohn's.  States diarrhea, but this is not unusual.  Denies blood in stool.

## 2017-10-28 NOTE — ED Provider Notes (Signed)
Medical screening examination/treatment/procedure(s) were conducted as a shared visit with non-physician practitioner(s) and myself.  I personally evaluated the patient during the encounter.  Clinical Impression:   Final diagnoses:  Exacerbation of Crohn's disease with other complication (HCC)  Partial small bowel obstruction ([place])    The patient is a pleasant 47 year old female who presents with some abdominal discomfort, mid abdominal pain with associated nausea yesterday and multiple episodes of watery diarrhea today.  The pain is sharp, intermittent and in the periumbilical region.  On exam she has a very soft abdomen with minimal tympanitic sounds to percussion in the mid abdomen.  She has no guarding, no pain at McBurney's point, clear heart and lung sounds, appears overall very comfortable at this point.  She has been given IV fluids and medication to help with her symptoms which have improved it.  Her x-ray shows a bowel gas pattern that could be concerning for obstruction or ileus, CT will be ordered as the patient has a prior history of abdominal surgery and may have adhesions.  Patient is agreeable to the plan.   Noemi Chapel, MD 10/29/17 [phone]
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
## 2017-10-30 ENCOUNTER — Other Ambulatory Visit: Payer: Self-pay | Admitting: Family Medicine

## 2017-11-03 ENCOUNTER — Other Ambulatory Visit: Payer: Self-pay

## 2017-11-03 ENCOUNTER — Inpatient Hospital Stay (HOSPITAL_COMMUNITY): Payer: BLUE CROSS/BLUE SHIELD | Attending: Hematology

## 2017-11-03 ENCOUNTER — Other Ambulatory Visit: Payer: Self-pay | Admitting: Family Medicine

## 2017-11-03 ENCOUNTER — Ambulatory Visit (HOSPITAL_COMMUNITY): Payer: Self-pay

## 2017-11-03 ENCOUNTER — Inpatient Hospital Stay (HOSPITAL_BASED_OUTPATIENT_CLINIC_OR_DEPARTMENT_OTHER): Payer: BLUE CROSS/BLUE SHIELD | Admitting: Hematology

## 2017-11-03 ENCOUNTER — Other Ambulatory Visit (HOSPITAL_COMMUNITY): Payer: Self-pay

## 2017-11-03 ENCOUNTER — Encounter (HOSPITAL_COMMUNITY): Payer: Self-pay | Admitting: Hematology

## 2017-11-03 VITALS — BP 125/52 | HR 91 | Temp 98.2°F | Resp 18 | Wt 225.4 lb

## 2017-11-03 DIAGNOSIS — D508 Other iron deficiency anemias: Secondary | ICD-10-CM

## 2017-11-03 DIAGNOSIS — Z87891 Personal history of nicotine dependence: Secondary | ICD-10-CM

## 2017-11-03 DIAGNOSIS — D509 Iron deficiency anemia, unspecified: Secondary | ICD-10-CM | POA: Diagnosis not present

## 2017-11-03 DIAGNOSIS — D72829 Elevated white blood cell count, unspecified: Secondary | ICD-10-CM | POA: Insufficient documentation

## 2017-11-03 LAB — COMPREHENSIVE METABOLIC PANEL
ALT: 16 U/L (ref 0–44)
AST: 19 U/L (ref 15–41)
Albumin: 3 g/dL — ABNORMAL LOW (ref 3.5–5.0)
Alkaline Phosphatase: 49 U/L (ref 38–126)
Anion gap: 8 (ref 5–15)
BUN: 18 mg/dL (ref 6–20)
CO2: 27 mmol/L (ref 22–32)
Calcium: 8.5 mg/dL — ABNORMAL LOW (ref 8.9–10.3)
Chloride: 103 mmol/L (ref 98–111)
Creatinine, Ser: 0.91 mg/dL (ref 0.44–1.00)
GFR calc Af Amer: >60 mL/min (ref >60)
GFR calc non Af Amer: >60 mL/min (ref >60)
Glucose, Bld: 115 mg/dL — ABNORMAL HIGH (ref 70–99)
Potassium: 3.8 mmol/L (ref 3.5–5.1)
Sodium: 138 mmol/L (ref 135–145)
Total Bilirubin: 0.4 mg/dL (ref 0.3–1.2)
Total Protein: 6.8 g/dL (ref 6.5–8.1)

## 2017-11-03 LAB — IRON AND TIBC
Iron: 71 ug/dL (ref 28–170)
Saturation Ratios: 22 % (ref 10.4–31.8)
TIBC: 317 ug/dL (ref 250–450)
UIBC: 246 ug/dL

## 2017-11-03 LAB — CBC WITH DIFFERENTIAL/PLATELET
Basophils Absolute: 0 10*3/uL (ref 0.0–0.1)
Basophils Relative: 0 %
Eosinophils Absolute: 0.4 10*3/uL (ref 0.0–0.7)
Eosinophils Relative: 2 %
HCT: 34.7 % — ABNORMAL LOW (ref 36.0–46.0)
Hemoglobin: 11.2 g/dL — ABNORMAL LOW (ref 12.0–15.0)
Lymphocytes Relative: 47 %
Lymphs Abs: 9.5 10*3/uL — ABNORMAL HIGH (ref 0.7–4.0)
MCH: 28 pg (ref 26.0–34.0)
MCHC: 32.3 g/dL (ref 30.0–36.0)
MCV: 86.8 fL (ref 78.0–100.0)
Monocytes Absolute: 1.6 10*3/uL — ABNORMAL HIGH (ref 0.1–1.0)
Monocytes Relative: 8 %
Neutro Abs: 8.6 10*3/uL — ABNORMAL HIGH (ref 1.7–7.7)
Neutrophils Relative %: 43 %
Platelets: 403 10*3/uL — ABNORMAL HIGH (ref 150–400)
RBC: 4 MIL/uL (ref 3.87–5.11)
RDW: 16.5 % — ABNORMAL HIGH (ref 11.5–15.5)
WBC: 20.1 10*3/uL — ABNORMAL HIGH (ref 4.0–10.5)

## 2017-11-03 LAB — FERRITIN: Ferritin: 51 ng/mL (ref 11–307)

## 2017-11-03 NOTE — Patient Instructions (Addendum)
[place] at [facility] Discharge Instructions   Follow up in 8 months with labs prior to your visit.   Thank you for choosing [place] at [facility] to provide your oncology and hematology care.  To afford each patient quality time with our provider, please arrive at least 15 minutes before your scheduled appointment time.   If you have a lab appointment with the [facility] please come in thru the  Main Entrance and check in at the main information desk  You need to re-schedule your appointment should you arrive 10 or more minutes late.  We strive to give you quality time with our providers, and arriving late affects you and other patients whose appointments are after yours.  Also, if you no show three or more times for appointments you may be dismissed from the clinic at the providers discretion.     Again, thank you for choosing [facility].  Our hope is that these requests will decrease the amount of time that you wait before being seen by our physicians.       _____________________________________________________________  Should you have questions after your visit to [facility], please contact our office at [phone] between the hours of 8:00 a.m. and 4:30 p.m.  Voicemails left after 4:00 p.m. will not be returned until the following business day.  For prescription refill requests, have your pharmacy contact our office and allow 72 hours.    Cancer Center Support Programs:   > Cancer Support Group  2nd Tuesday of the month 1pm-2pm, Journey Room
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
## 2017-11-03 NOTE — Assessment & Plan Note (Signed)
1.  Iron deficiency anemia: - She is continuing Niferex 1 tablet daily without any major problems.  Has dark stools but denies any bleeding per rectum. - I have reviewed the results of iron panel from 09/20/2017.  Ferritin was 165 and percent saturation was 26. -Today's CBC shows hemoglobin of 11.2.  Anemia likely from chronic inflammation and blood loss.  She had irregular menses this month. -Patient has a history of anaphylaxis to [facility].  She does not require any parenteral iron at this time.  If she does need it in the future, will consider Injectafer. -She will continue same dose of iron tablet daily.  We will see her back in 8 months for follow-up.  2.  Leukocytosis: - Recently had a ER visit on 10/28/2017 with abdominal pain.  CT scan of the abdomen and pelvis showed wall thickening of the distal small bowel at the anastomosis with:.  Partial obstruction of proximal small bowel was seen. -She was treated with prednisone 60 mg daily, last dose was yesterday. -Her white count today is elevated at 20.  Absolute lymphocyte count was more elevated than absolute neutrophil count.  This is likely from prednisone.  We will repeat it at next visit.

## 2017-11-07 ENCOUNTER — Other Ambulatory Visit: Payer: Self-pay | Admitting: Family Medicine

## 2017-11-14 ENCOUNTER — Other Ambulatory Visit: Payer: Self-pay | Admitting: Family Medicine

## 2017-11-27 ENCOUNTER — Encounter: Payer: Self-pay | Admitting: Family Medicine

## 2017-11-29 DIAGNOSIS — Z13220 Encounter for screening for lipoid disorders: Secondary | ICD-10-CM | POA: Diagnosis not present

## 2017-11-29 DIAGNOSIS — R7301 Impaired fasting glucose: Secondary | ICD-10-CM | POA: Diagnosis not present

## 2017-11-30 ENCOUNTER — Other Ambulatory Visit: Payer: Self-pay | Admitting: Family Medicine

## 2017-11-30 LAB — HEMOGLOBIN A1C
EAG (MMOL/L): 5.8 (calc)
Hgb A1c MFr Bld: 5.3 % of total Hgb (ref <5.7)
MEAN PLASMA GLUCOSE: 105 (calc)

## 2017-11-30 LAB — LIPID PANEL
CHOLESTEROL: 154 mg/dL (ref <200)
HDL: 59 mg/dL (ref >50)
LDL Cholesterol (Calc): 75 mg/dL (calc)
NON-HDL CHOLESTEROL (CALC): 95 mg/dL (ref <130)
Total CHOL/HDL Ratio: 2.6 (calc) (ref <5.0)
Triglycerides: 115 mg/dL (ref <150)

## 2017-11-30 LAB — TSH: TSH: 0.91 mIU/L

## 2017-11-30 LAB — BASIC METABOLIC PANEL WITH GFR
BUN: 8 mg/dL (ref 7–25)
CO2: 28 mmol/L (ref 20–32)
Calcium: 9.1 mg/dL (ref 8.6–10.2)
Chloride: 105 mmol/L (ref 98–110)
Creat: 0.78 mg/dL (ref 0.50–1.10)
GFR, EST NON AFRICAN AMERICAN: 91 mL/min/{1.73_m2} (ref >=60)
GFR, Est African American: 105 mL/min/{1.73_m2} (ref >=60)
Glucose, Bld: 85 mg/dL (ref 65–99)
Potassium: 4.5 mmol/L (ref 3.5–5.3)
Sodium: 140 mmol/L (ref 135–146)

## 2017-12-03 ENCOUNTER — Encounter: Payer: Self-pay | Admitting: Family Medicine

## 2017-12-06 ENCOUNTER — Other Ambulatory Visit: Payer: Self-pay

## 2017-12-06 MED ORDER — OMEPRAZOLE 20 MG PO CPDR: DELAYED_RELEASE_CAPSULE | ORAL | 3 refills | Status: DC

## 2017-12-07 ENCOUNTER — Encounter: Payer: Self-pay | Admitting: Family Medicine

## 2017-12-07 ENCOUNTER — Other Ambulatory Visit: Payer: Self-pay

## 2017-12-07 ENCOUNTER — Ambulatory Visit (INDEPENDENT_AMBULATORY_CARE_PROVIDER_SITE_OTHER): Payer: BLUE CROSS/BLUE SHIELD | Admitting: Family Medicine

## 2017-12-07 ENCOUNTER — Other Ambulatory Visit (HOSPITAL_COMMUNITY)
Admission: RE | Admit: 2017-12-07 | Discharge: 2017-12-07 | Disposition: A | Discharge status: Home / Self Care | Payer: BLUE CROSS/BLUE SHIELD | Source: Ambulatory Visit | Attending: Family Medicine | Admitting: Family Medicine

## 2017-12-07 VITALS — BP 116/80 | HR 92 | Resp 12 | Ht 67.5 in | Wt 219.1 lb

## 2017-12-07 DIAGNOSIS — Z0000 Encounter for general adult medical examination without abnormal findings: Secondary | ICD-10-CM | POA: Diagnosis not present

## 2017-12-07 DIAGNOSIS — Z124 Encounter for screening for malignant neoplasm of cervix: Secondary | ICD-10-CM

## 2017-12-07 DIAGNOSIS — Z23 Encounter for immunization: Secondary | ICD-10-CM | POA: Diagnosis not present

## 2017-12-07 MED ORDER — MONTELUKAST SODIUM 10 MG PO TABS: ORAL_TABLET | ORAL | 2 refills | Status: DC

## 2017-12-07 MED ORDER — FUROSEMIDE 20 MG PO TABS: ORAL_TABLET | ORAL | 2 refills | Status: DC

## 2017-12-07 NOTE — Patient Instructions (Addendum)
F/U in 4.5 months, call if you need me before  Flu vaccine today  Excellent labs, congrats  No med change, acyclovir is being refilled  Pap today  Weight loss goal of 10 pounds or more  It is important that you exercise regularly at least 30 minutes 5 times a week. If you develop chest pain, have severe difficulty breathing, or feel very tired, stop exercising immediately and seek medical attention  Thank you  for choosing [facility]. We consider it a privelige to serve you.  Delivering excellent health care in a caring and  compassionate way is our goal.  Partnering with you,  so that together we can achieve this goal is our strategy.

## 2017-12-09 ENCOUNTER — Encounter: Payer: Self-pay | Admitting: Family Medicine

## 2017-12-09 NOTE — Assessment & Plan Note (Signed)

## 2017-12-09 NOTE — Progress Notes (Signed)
    [name]     MRN: [id number]      DOB: [birth year]  HPI: Patient is in for annual physical exam. C/o right shoulder pain with limitation in movement, requests ortho eval., no associated trauma as a trigger. Immunization is reviewed , and  updated   PE: BP 116/80 (BP Location: Left Arm, Patient Position: Sitting, Cuff Size: Large)   Pulse 92   Resp 12   Ht 5' 7.5" (1.715 m)   Wt 219 lb 1.9 oz (99.4 kg)   SpO2 97% Comment: room air  BMI 33.81 kg/m   Pleasant  female, alert and oriented x 3, in no cardio-pulmonary distress. Afebrile. HEENT No facial trauma or asymetry. Sinuses non tender.  Extra occullar muscles intact, pupils equally reactive to light. External ears normal, tympanic membranes clear. Oropharynx moist, no exudate. Neck: supple, no adenopathy,JVD or thyromegaly.No bruits.  Chest: Clear to ascultation bilaterally.No crackles or wheezes. Non tender to palpation  Breast: No asymetry,no masses or lumps. No tenderness. No nipple discharge or inversion. No axillary or supraclavicular adenopathy  Cardiovascular system; Heart sounds normal,  S1 and  S2 ,no S3.  No murmur, or thrill. Apical beat not displaced Peripheral pulses normal.  Abdomen: Soft, non tender, no organomegaly or masses. No bruits. Bowel sounds normal. No guarding, tenderness or rebound.    GU: External genitalia normal female genitalia , normal female distribution of hair. No lesions. Urethral meatus normal in size, no  Prolapse, no lesions visibly  Present. Bladder non tender. Vagina pink and moist , with no visible lesions , discharge present . Adequate pelvic support no  cystocele or rectocele noted Cervix pink and appears healthy, no lesions or ulcerations noted, no discharge noted from os Uterus normal size, no adnexal masses, no cervical motion or adnexal tenderness.   Musculoskeletal exam: Full ROM of spine, hips ,  and knees.Decreased ROM rigth shoulder with tenderness  on palpation, normal ROM left shoulder No deformity ,swelling or crepitus noted. No muscle wasting or atrophy.   Neurologic: Cranial nerves 2 to 12 intact. Power, tone ,sensation and reflexes normal throughout. No disturbance in gait. No tremor.  Skin: Intact, no ulceration, erythema , scaling or rash noted. Pigmentation normal throughout  Psych; Normal mood and affect. Judgement and concentration normal   Assessment & Plan:  Annual physical exam Annual exam as documented. Counseling done  re healthy lifestyle involving commitment to 150 minutes exercise per week, heart healthy diet, and attaining healthy weight.The importance of adequate sleep also discussed. Regular seat belt use and home safety, is also discussed. Changes in health habits are decided on by the patient with goals and time frames  set for achieving them. Immunization and cancer screening needs are specifically addressed at this visit.   Need for immunization against influenza After obtaining informed consent, the vaccine is  administered by LPN.

## 2017-12-09 NOTE — Assessment & Plan Note (Signed)
After obtaining informed consent, the vaccine is  administered by LPN.  

## 2017-12-11 ENCOUNTER — Encounter: Payer: Self-pay | Admitting: Family Medicine

## 2017-12-11 LAB — CYTOLOGY - PAP
Diagnosis: NEGATIVE
HPV (WINDOPATH): NOT DETECTED

## 2017-12-12 ENCOUNTER — Other Ambulatory Visit: Payer: Self-pay | Admitting: Family Medicine

## 2017-12-17 ENCOUNTER — Other Ambulatory Visit: Payer: Self-pay | Admitting: Family Medicine

## 2017-12-23 ENCOUNTER — Other Ambulatory Visit: Payer: Self-pay | Admitting: Family Medicine

## 2017-12-30 ENCOUNTER — Other Ambulatory Visit: Payer: Self-pay | Admitting: Family Medicine

## 2018-01-06 ENCOUNTER — Other Ambulatory Visit: Payer: Self-pay | Admitting: Family Medicine

## 2018-01-11 ENCOUNTER — Other Ambulatory Visit: Payer: Self-pay | Admitting: Family Medicine

## 2018-01-12 ENCOUNTER — Telehealth: Payer: Self-pay

## 2018-01-12 ENCOUNTER — Encounter: Payer: Self-pay | Admitting: Family Medicine

## 2018-01-12 DIAGNOSIS — M25511 Pain in right shoulder: Secondary | ICD-10-CM

## 2018-01-12 NOTE — Telephone Encounter (Signed)
Called patient in regards to her referral, no referral was placed on the day of her visit (12-07-17). So order for referral was placed today, 01-12-18 to Dr. Arther Abbott for right shoulder pain

## 2018-01-12 NOTE — Telephone Encounter (Signed)
Referral to Dr. Aline Brochure sent

## 2018-01-14 ENCOUNTER — Other Ambulatory Visit: Payer: Self-pay | Admitting: Family Medicine

## 2018-01-17 ENCOUNTER — Other Ambulatory Visit: Payer: Self-pay | Admitting: Family Medicine

## 2018-01-20 ENCOUNTER — Other Ambulatory Visit: Payer: Self-pay | Admitting: Family Medicine

## 2018-02-07 ENCOUNTER — Ambulatory Visit (INDEPENDENT_AMBULATORY_CARE_PROVIDER_SITE_OTHER): Payer: BLUE CROSS/BLUE SHIELD | Admitting: Orthopaedic Surgery

## 2018-02-07 ENCOUNTER — Ambulatory Visit (INDEPENDENT_AMBULATORY_CARE_PROVIDER_SITE_OTHER): Payer: Medicare HMO

## 2018-02-07 ENCOUNTER — Encounter: Payer: Self-pay | Admitting: Orthopaedic Surgery

## 2018-02-07 VITALS — BP 136/71 | HR 85 | Ht 67.5 in | Wt 211.0 lb

## 2018-02-07 DIAGNOSIS — M25511 Pain in right shoulder: Secondary | ICD-10-CM

## 2018-02-07 MED ORDER — NAPROXEN 500 MG PO TABS: 500.0000 mg | ORAL_TABLET | Freq: Two times a day (BID) | ORAL | 5 refills | Status: DC
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
## 2018-02-13 ENCOUNTER — Other Ambulatory Visit: Payer: Self-pay | Admitting: Family Medicine

## 2018-03-05 ENCOUNTER — Telehealth: Payer: Self-pay | Admitting: Internal Medicine

## 2018-03-05 ENCOUNTER — Encounter: Payer: Self-pay | Admitting: Family Medicine

## 2018-03-05 DIAGNOSIS — K50019 Crohn's disease of small intestine with unspecified complications: Secondary | ICD-10-CM

## 2018-03-05 NOTE — Telephone Encounter (Signed)
Noted. Pharmacy was added.

## 2018-03-05 NOTE — Telephone Encounter (Signed)
Pt called to say that she received a letter that her Pharmacy is now [facility] and the number is [phone]

## 2018-03-08 ENCOUNTER — Encounter: Payer: Self-pay | Admitting: Orthopaedic Surgery

## 2018-03-08 ENCOUNTER — Ambulatory Visit (INDEPENDENT_AMBULATORY_CARE_PROVIDER_SITE_OTHER): Payer: Medicare HMO | Admitting: Orthopaedic Surgery

## 2018-03-08 VITALS — BP 116/73 | HR 84 | Ht 67.5 in | Wt 210.0 lb

## 2018-03-08 DIAGNOSIS — M25511 Pain in right shoulder: Secondary | ICD-10-CM
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
## 2018-03-14 ENCOUNTER — Other Ambulatory Visit: Payer: Self-pay

## 2018-03-14 ENCOUNTER — Encounter: Payer: Self-pay | Admitting: Family Medicine

## 2018-03-14 NOTE — Telephone Encounter (Signed)
Stacey from [facility] called to get an RX for Humira for pt. This will be there first time filing the RX for Pt. Medication can be sent electronically to them.

## 2018-03-15 ENCOUNTER — Other Ambulatory Visit: Payer: Self-pay | Admitting: Family Medicine

## 2018-03-15 MED ORDER — ADALIMUMAB 40 MG/0.8ML ~~LOC~~ AJKT: 40.0000 mg | AUTO-INJECTOR | SUBCUTANEOUS | 3 refills | Status: DC

## 2018-03-15 MED ORDER — ADALIMUMAB 40 MG/0.8ML ~~LOC~~ AJKT: 40.0000 mg | AUTO-INJECTOR | SUBCUTANEOUS | 5 refills | Status: DC

## 2018-03-15 NOTE — Addendum Note (Signed)
Addended by: Gordy Levan, Becky Berberian A on: 03/15/2018 03:25 PM   Modules accepted: Orders

## 2018-03-15 NOTE — Telephone Encounter (Signed)
Please tell the patient her Rx was sent to the new pharmacy. Please keep f/u OV scheduled for 03/23/2018 for follow-up and to check labs.

## 2018-03-16 NOTE — Telephone Encounter (Signed)
Left a detailed message on pts machine.

## 2018-03-23 ENCOUNTER — Encounter: Payer: Self-pay | Admitting: Gastroenterology

## 2018-03-23 ENCOUNTER — Ambulatory Visit: Payer: Self-pay | Admitting: Gastroenterology

## 2018-03-23 ENCOUNTER — Ambulatory Visit (INDEPENDENT_AMBULATORY_CARE_PROVIDER_SITE_OTHER): Payer: BLUE CROSS/BLUE SHIELD | Admitting: Gastroenterology

## 2018-03-23 VITALS — BP 124/72 | HR 76 | Temp 98.0°F | Ht 68.0 in | Wt 205.8 lb

## 2018-03-23 DIAGNOSIS — K5080 Crohn's disease of both small and large intestine without complications: Secondary | ICD-10-CM
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
## 2018-03-23 NOTE — Patient Instructions (Signed)
I have ordered blood work to check your Humira levels and antibodies. This will need to be done on Monday, as that is the day before your next injection. I also ordered a routine blood count and metabolic panel.  We will see you in 6 months!  I enjoyed seeing you again today! As you know, I value our relationship and want to provide genuine, compassionate, and quality care. I welcome your feedback. If you receive a survey regarding your visit,  I greatly appreciate you taking time to fill this out. See you next time!  Annitta Needs, PhD, ANP-BC [facility]

## 2018-03-26 ENCOUNTER — Telehealth: Payer: Self-pay

## 2018-03-26 NOTE — Telephone Encounter (Signed)
PA for Humira started on covermymeds. Waiting on an approval or denial.

## 2018-03-27 ENCOUNTER — Telehealth: Payer: Self-pay | Admitting: Internal Medicine

## 2018-03-27 NOTE — Telephone Encounter (Signed)
Alicia: please see patient message from today. I am copying it below. At one point, we had Humira samples here. Do we still have that?  From MyChart message: "Good morning. I haven't received my medicine due to the insurance only wants to cover only 2 doses of humira instead of 4. I recieve extra help from [employer] for the humira. I am 2 weeks behind schedule and my bowels are acting different. I don't like to be behind like this, I need some medicine to help me out until this mess is straightened out. The [employer] said you would need to contact the insurance. I need some humira to get me thru til my prescriptions are filled.   Please let me know something ASAP. I'm trying to avoid hospitalization at all cost!"   She is supposed to be taking weekly. I am not sure what is going on.

## 2018-03-27 NOTE — Telephone Encounter (Signed)
I believe the Humira Ambassador number is: [phone].  She needs to call and get registered with them because they can help guide through this process. We will help on our end however we can.

## 2018-03-27 NOTE — Telephone Encounter (Signed)
PA was submitted through [email or web address] on Friday 03/23/18. PA was cancelled when looked today. Received a letter from [facility] stating pt has no RX benefit with [employer] line of business. I've called [employer] and was asked to leave a VM for [place]. Waiting on a return call.   AB- I don't see any samples of biologic. Pt was notified that I'm working on [place]. Pt isn't happy and is yelling on the phone. Pt is aware that I can only control on my end what's needed for the PA and can't approve or deny her medication. Waiting on a return call from [place].

## 2018-03-27 NOTE — Telephone Encounter (Signed)
[phone]  Please call patient, she says she had been out of Humira a few weeks and she is starting to feel "a little crazy"

## 2018-03-27 NOTE — Telephone Encounter (Signed)
Noted. Pt will call them in the morning and call our office back after she talks with them. Waiting on a return call from pt.

## 2018-03-28 ENCOUNTER — Telehealth: Payer: Self-pay | Admitting: Internal Medicine

## 2018-03-28 DIAGNOSIS — K50019 Crohn's disease of small intestine with unspecified complications: Secondary | ICD-10-CM

## 2018-03-28 MED ORDER — ADALIMUMAB 40 MG/0.8ML ~~LOC~~ AJKT: 40.0000 mg | AUTO-INJECTOR | SUBCUTANEOUS | 1 refills | Status: DC

## 2018-03-28 NOTE — Telephone Encounter (Addendum)
I spoke with Colletta Maryland at [facility] who is the new pharmacy Dispensing optician for [place].  She gave me a grand fathered PA# [id number] that is valid from 03/07/18-09/04/18.   However, it is only for 2 Humiara Pens a month, but the patient needs 4 pens a month.   Somehow the call was disconnected and I spoke with [place] with [employer] in the speciality pharmacy at [phone] and they escalated the call to the [place] and we should hear something by the end of the business day.

## 2018-03-28 NOTE — Telephone Encounter (Signed)
Pt called wanting to speak with the nurse. I told her that I needed to take a message that the nurse was with another patient. Patient has questions about her medications. Please call her at [phone]

## 2018-03-28 NOTE — Telephone Encounter (Signed)
Colletta Maryland from [place] called- she asked me to let Rosendo Gros know that she has been working on this for the pt and has gotten an override for the pt to get 4 pens this month instead of just 2. She said this will give [place] time to get the new PA done for the Humira. She is requesting Rosendo Gros call her in the AM at [phone].  Elmo Putt, please submit new PA for Humira weekly.

## 2018-03-28 NOTE — Telephone Encounter (Signed)
I spoke with Emily Filbert with [place] at [phone].  I explained that the patient needs to have her Humira Pen 4 a month.  She stated she would need to transfer me to a pharmacist.

## 2018-03-28 NOTE — Telephone Encounter (Signed)
I tried to call the patient, no answer,lmom

## 2018-03-28 NOTE — Assessment & Plan Note (Signed)
Pleasant 48 year old female presenting for routine follow-up, with need for weekly dosing of Humira due to low Humira levels. Overall doing well. Needs updated labs to include CBC, CMP, and we will recheck Humira trough and antibodies on Jan 20th, as her next dose is due the 21st. Return in 6 months.
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
## 2018-03-28 NOTE — Telephone Encounter (Signed)
New PA submitted on Covermymeds.com for [employer] [place]. Waiting on an approval or denial.

## 2018-03-29 DIAGNOSIS — K50019 Crohn's disease of small intestine with unspecified complications: Secondary | ICD-10-CM | POA: Diagnosis not present

## 2018-03-29 DIAGNOSIS — K5080 Crohn's disease of both small and large intestine without complications: Secondary | ICD-10-CM | POA: Diagnosis not present

## 2018-03-29 NOTE — Telephone Encounter (Signed)
Spoke with pt. She is aware that she needs to go to the lab and have updated TB drawn. Called [facility] and they didn't find pt in the system. Called pt back to inquire about [facility] labs and she hasn't gone to [facility] to have them done. I told pt I can fax the orders over for TB screening to [facility] and pt said I HAVE THE ORDERS.   Spoke with AB and we will hold off on the [facility] labs for now. AB will let her know when she can have thoes done. Left a detailed message for pt. Pt needs to have TB labs done and the other blood work AB requested at her appointment last week. The only lab pt wont complete is the [facility] lab.

## 2018-03-29 NOTE — Telephone Encounter (Signed)
I left a message with Anitra for Colletta Maryland to return my call.

## 2018-03-29 NOTE — Telephone Encounter (Addendum)
I spoke with [name] and she asked me to complete a PA via there website [email or web address]  The form was completed and medical documents were attached.   I called [name] back and confirmed that they did receive the electronic PA.   She also asked me to call [name] and I spoke with [phone]) to ensure they send the patient's medication out under the current PA and her copayment should be $5.00.  They will call the patient to collect the copayment and then ship her medication which she confirmed 4 Humiara pens for one month.

## 2018-03-29 NOTE — Telephone Encounter (Signed)
I called and made the patient to make her aware of the one month approval and that we are working on future approvals and will get back to her.   I was unable to reach her, lmom

## 2018-03-29 NOTE — Telephone Encounter (Signed)
Tried calling pt again and wasn't able to reach her. Faxed orders to [facility] just in case pt decided to go there instead of  [facility] across the street.

## 2018-03-29 NOTE — Telephone Encounter (Signed)
TB evaluation in remote past prior to starting Humira. We do not have this on file here that I can see. Negative Hep B surface antigen in March 2010.  I have ordered TB quantiferon gold assay. She can have this done at the lab.  Can we also check to see if she completed the CBC, CMP, and Humira antibodies? I am unsure if she had to go to [facility] or the hospital for this. It's not showing in my results. If she hasn't done this yet, then would need to check the Humira antibodies the day before the next dose is due.

## 2018-03-29 NOTE — Progress Notes (Signed)
cc'd to pcp 

## 2018-03-29 NOTE — Telephone Encounter (Signed)
Patient made aware.

## 2018-03-29 NOTE — Telephone Encounter (Signed)
Per [place] the patinet needs an updated TB test.  Routing to Vicente Males to place the order.

## 2018-03-29 NOTE — Addendum Note (Signed)
Addended by: Annitta Needs on: 03/29/2018 09:25 AM   Modules accepted: Orders

## 2018-03-31 LAB — CBC WITH DIFFERENTIAL/PLATELET
Absolute Monocytes: 708 cells/uL (ref 200–950)
Basophils Absolute: 58 cells/uL (ref 0–200)
Basophils Relative: 0.5 %
Eosinophils Absolute: 278 cells/uL (ref 15–500)
Eosinophils Relative: 2.4 %
HCT: 37.6 % (ref 35.0–45.0)
HEMOGLOBIN: 12.3 g/dL (ref 11.7–15.5)
LYMPHS ABS: 6067 {cells}/uL — AB (ref 850–3900)
MCH: 27.4 pg (ref 27.0–33.0)
MCHC: 32.7 g/dL (ref 32.0–36.0)
MCV: 83.7 fL (ref 80.0–100.0)
MPV: 9.9 fL (ref 7.5–12.5)
Monocytes Relative: 6.1 %
NEUTROS ABS: 4489 {cells}/uL (ref 1500–7800)
Neutrophils Relative %: 38.7 %
Platelets: 484 10*3/uL — ABNORMAL HIGH (ref 140–400)
RBC: 4.49 10*6/uL (ref 3.80–5.10)
RDW: 14.4 % (ref 11.0–15.0)
Total Lymphocyte: 52.3 %
WBC: 11.6 10*3/uL — ABNORMAL HIGH (ref 3.8–10.8)

## 2018-03-31 LAB — COMPLETE METABOLIC PANEL WITH GFR
AG Ratio: 0.9 (calc) — ABNORMAL LOW (ref 1.0–2.5)
ALT: 12 U/L (ref 6–29)
AST: 14 U/L (ref 10–35)
Albumin: 3.5 g/dL — ABNORMAL LOW (ref 3.6–5.1)
Alkaline phosphatase (APISO): 65 U/L (ref 33–115)
BUN: 8 mg/dL (ref 7–25)
CO2: 26 mmol/L (ref 20–32)
Calcium: 9 mg/dL (ref 8.6–10.2)
Chloride: 104 mmol/L (ref 98–110)
Creat: 0.71 mg/dL (ref 0.50–1.10)
GFR, Est African American: 117 mL/min/{1.73_m2} (ref >=60)
GFR, Est Non African American: 101 mL/min/{1.73_m2} (ref >=60)
Globulin: 3.7 g/dL (calc) (ref 1.9–3.7)
Glucose, Bld: 77 mg/dL (ref 65–139)
Potassium: 4.4 mmol/L (ref 3.5–5.3)
Sodium: 139 mmol/L (ref 135–146)
Total Bilirubin: 0.2 mg/dL (ref 0.2–1.2)
Total Protein: 7.2 g/dL (ref 6.1–8.1)

## 2018-03-31 LAB — QUANTIFERON-TB GOLD PLUS
NIL: 1.85 IU/mL
QuantiFERON-TB Gold Plus: POSITIVE — AB
TB1-NIL: <0 IU/mL
TB2-NIL: 0.62 IU/mL

## 2018-04-02 NOTE — Telephone Encounter (Signed)
I spoke with Derian Pfost. At [phone].  She works with the patient's pharmacy benefits plan and she stated the PA was approved for 4 Humiara pens a month for one year [id number] 05/05/18-05/05/19.

## 2018-04-02 NOTE — Telephone Encounter (Signed)
I spoke with [name] with [employer] and she stated that the patient should receive her 4 Humiara pens for a one month supply for $5.00 and should ship out to the patient tomorrow.

## 2018-04-03 NOTE — Telephone Encounter (Signed)
I spoke with Minette Brine with [employer] and she stated the patient had 4 pens shipped out today, however there is a problem with the address listed with [employer].  We have verified the correct address and they will have someone reach out to [employer] to correct this error and make sure the medication is shipped out to the patient.

## 2018-04-04 NOTE — Telephone Encounter (Signed)
Quantiferon TB gold plus assay was positive. We need to have her do a chest xray, 2 view. Then, we can send to insurance. LFTs normal. Mildly elevated WBC at 11.6. Much improved from past.

## 2018-04-04 NOTE — Telephone Encounter (Signed)
I spoke with the patient and she stated she did receive her 4 Humiara pens yesterday via [employer] and she is scheduled to receive the other four via [employer] for the month of February next week.

## 2018-04-04 NOTE — Telephone Encounter (Signed)
I spoke with [name] with [facility] and she stated the 4 Humiara pens will be shipped out on 04/12/2018 and the patient was aware.

## 2018-04-05 ENCOUNTER — Encounter: Payer: Self-pay | Admitting: Orthopaedic Surgery

## 2018-04-05 ENCOUNTER — Ambulatory Visit (INDEPENDENT_AMBULATORY_CARE_PROVIDER_SITE_OTHER): Payer: BLUE CROSS/BLUE SHIELD | Admitting: Orthopaedic Surgery

## 2018-04-05 VITALS — BP 123/78 | HR 81 | Ht 68.0 in | Wt 202.0 lb

## 2018-04-05 DIAGNOSIS — M542 Cervicalgia: Secondary | ICD-10-CM | POA: Diagnosis not present

## 2018-04-05 DIAGNOSIS — M25511 Pain in right shoulder: Secondary | ICD-10-CM | POA: Diagnosis not present
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
## 2018-04-06 ENCOUNTER — Other Ambulatory Visit: Payer: Self-pay | Admitting: *Deleted

## 2018-04-06 DIAGNOSIS — R7612 Nonspecific reaction to cell mediated immunity measurement of gamma interferon antigen response without active tuberculosis: Secondary | ICD-10-CM

## 2018-04-09 ENCOUNTER — Ambulatory Visit (HOSPITAL_COMMUNITY)
Admission: RE | Admit: 2018-04-09 | Discharge: 2018-04-09 | Disposition: A | Discharge status: Home / Self Care | Payer: BLUE CROSS/BLUE SHIELD | Source: Ambulatory Visit | Attending: Gastroenterology | Admitting: Gastroenterology

## 2018-04-09 DIAGNOSIS — R7612 Nonspecific reaction to cell mediated immunity measurement of gamma interferon antigen response without active tuberculosis: Secondary | ICD-10-CM | POA: Diagnosis not present

## 2018-04-11 NOTE — Progress Notes (Signed)
TB test needs to be on file for renewal with insurance company

## 2018-04-11 NOTE — Progress Notes (Signed)
CXR is negative. Please send this and the TB assay to insurance. I believe they were requesting this? [place], [place].

## 2018-04-13 MED ORDER — OMEPRAZOLE 20 MG PO CPDR: DELAYED_RELEASE_CAPSULE | ORAL | 3 refills | Status: DC

## 2018-04-16 ENCOUNTER — Telehealth: Payer: Self-pay

## 2018-04-16 DIAGNOSIS — K219 Gastro-esophageal reflux disease without esophagitis: Secondary | ICD-10-CM

## 2018-04-16 MED ORDER — OMEPRAZOLE 20 MG PO CPDR: DELAYED_RELEASE_CAPSULE | ORAL | 3 refills | Status: DC

## 2018-04-16 NOTE — Telephone Encounter (Signed)
Omeprazole 20 mg #180 w 3 rfs was sent to [facility]. They sent a fax asking our office to send RX to pts local pharmacy since Omeprazole 20 mg isn't a specialty pharmacy. [facility] is listed in pts chart.

## 2018-04-16 NOTE — Addendum Note (Signed)
Addended by: Gordy Levan, Massiel Stipp A on: 04/16/2018 10:41 AM   Modules accepted: Orders

## 2018-04-16 NOTE — Telephone Encounter (Signed)
Rx sent to local pharmacy.  

## 2018-04-17 ENCOUNTER — Encounter: Payer: Self-pay | Admitting: Family Medicine

## 2018-04-17 ENCOUNTER — Ambulatory Visit (INDEPENDENT_AMBULATORY_CARE_PROVIDER_SITE_OTHER): Payer: BLUE CROSS/BLUE SHIELD | Admitting: Family Medicine

## 2018-04-17 VITALS — BP 110/82 | HR 81 | Resp 14 | Ht 68.0 in | Wt 205.0 lb

## 2018-04-17 DIAGNOSIS — F324 Major depressive disorder, single episode, in partial remission: Secondary | ICD-10-CM | POA: Diagnosis not present

## 2018-04-17 DIAGNOSIS — J3089 Other allergic rhinitis: Secondary | ICD-10-CM

## 2018-04-17 DIAGNOSIS — K50019 Crohn's disease of small intestine with unspecified complications: Secondary | ICD-10-CM | POA: Diagnosis not present

## 2018-04-17 DIAGNOSIS — E669 Obesity, unspecified: Secondary | ICD-10-CM | POA: Diagnosis not present

## 2018-04-17 DIAGNOSIS — R69 Illness, unspecified: Secondary | ICD-10-CM | POA: Diagnosis not present

## 2018-04-17 DIAGNOSIS — K219 Gastro-esophageal reflux disease without esophagitis: Secondary | ICD-10-CM

## 2018-04-17 NOTE — Patient Instructions (Addendum)
Physical exam October 4 or after, call if you need me sooner  Weight loss goal of 20  pounds Mindful Eating Think about what you will eat, plan ahead. Choose " clean, green, fresh or frozen" over canned, processed or packaged foods which are more sugary, salty and fatty. 70 to 75% of food eaten should be vegetables and fruit. Three meals at set times with snacks allowed between meals, but they must be fruit or vegetables. Aim to eat over a 12 hour period , example 7 am to 7 pm, and STOP after  your last meal of the day. Drink water,generally about 64 ounces per day, no other drink is as healthy. Fruit juice is best enjoyed in a healthy way, by EATING the fruit.'  Please work on sleep hygiene and follow the change  Mammogram due July 26 or after, call and schedule in April  CONGRATS on excellent weight loss through change in food choice and exercise commitment Send message for labs needed for your October visit by August please  Thank you  for choosing [facility]. We consider it a privelige to serve you.  Delivering excellent health care in a caring and  compassionate way is our goal.  Partnering with you,  so that together we can achieve this goal is our strategy.

## 2018-04-21 ENCOUNTER — Encounter: Payer: Self-pay | Admitting: Family Medicine

## 2018-04-21 NOTE — Assessment & Plan Note (Signed)
Marked improvement, 20 pound weight loss in 4 months, which is excellent, she is applauded on this and encouraged to continue same Patient re-educated about  the importance of commitment to a  minimum of 150 minutes of exercise per week as able.  The importance of healthy food choices with portion control discussed, as well as eating regularly and within a 12 hour window most days. The need to choose "clean , green" food 50 to 75% of the time is discussed, as well as to make water the primary drink and set a goal of 64 ounces water daily.  Encouraged to start a food diary,  and to consider  joining a support group. Sample diet sheets offered. Goals set by the patient for the next several months.   Weight /BMI 04/17/2018 04/05/2018 03/23/2018  WEIGHT 205 lb 202 lb 205 lb 12.8 oz  HEIGHT 5' 8"  5' 8"  5' 8"   BMI 31.17 kg/m2 30.71 kg/m2 31.29 kg/m2  Some encounter information is confidential and restricted. Go to Review Flowsheets activity to see all data.

## 2018-04-21 NOTE — Assessment & Plan Note (Signed)
Followed by GI, no flares since last visit, no hospitalizations

## 2018-04-21 NOTE — Assessment & Plan Note (Signed)
Controlled, no change in medication  

## 2018-04-21 NOTE — Progress Notes (Signed)
   [name]     MRN: [id number]      DOB: 02/18/1971   HPI [name] is here for follow up and re-evaluation of chronic medical conditions, medication management and review of any available recent lab and radiology data.  Preventive health is updated, specifically  Cancer screening and Immunization.   Questions or concerns regarding consultations or procedures which the PT has had in the interim are  addressed. The PT denies any adverse reactions to current medications since the last visit.  There are no new concerns.  There are no specific complaints  Has changed lifestyle with exercise commitment and change in folkd choice with excellent weight loss, she feels much better also  ROS Denies recent fever or chills. Denies sinus pressure, nasal congestion, ear pain or sore throat. Denies chest congestion, productive cough or wheezing. Denies chest pains, palpitations and leg swelling Denies abdominal pain, nausea, vomiting,diarrhea or constipation.   Denies dysuria, frequency, hesitancy or incontinence. Denies joint pain, swelling and limitation in mobility. Denies headaches, seizures, numbness, or tingling. Denies depression, anxiety does c/o mild  insomnia. Denies skin break down or rash.   PE  BP 110/82   Pulse 81   Resp 14   Ht 5' 8"  (1.727 m)   Wt 205 lb (93 kg)   LMP 03/23/2018   SpO2 97%   BMI 31.17 kg/m   Patient alert and oriented and in no cardiopulmonary distress.  HEENT: No facial asymmetry, EOMI,   oropharynx pink and moist.  Neck supple no JVD, no mass.  Chest: Clear to auscultation bilaterally.  CVS: S1, S2 no murmurs, no S3.Regular rate.  ABD: Soft non tender.   Ext: No edema  MS: Adequate ROM spine, shoulders, hips and knees.  Skin: Intact, no ulcerations or rash noted.  Psych: Good eye contact, normal affect. Memory intact not anxious or depressed appearing.  CNS: CN 2-12 intact, power,  normal throughout.no focal deficits  noted.   Assessment & Plan  Allergic rhinitis Controlled, no change in medication   Major depression in partial remission ([name]) Managed by Psychiatry and well controlled on current medication, score of 3, no medications change needed  Obesity (BMI 30.0-34.9) Marked improvement, 20 pound weight loss in 4 months, which is excellent, she is applauded on this and encouraged to continue same Patient re-educated about  the importance of commitment to a  minimum of 150 minutes of exercise per week as able.  The importance of healthy food choices with portion control discussed, as well as eating regularly and within a 12 hour window most days. The need to choose "clean , green" food 50 to 75% of the time is discussed, as well as to make water the primary drink and set a goal of 64 ounces water daily.  Encouraged to start a food diary,  and to consider  joining a support group. Sample diet sheets offered. Goals set by the patient for the next several months.   Weight /BMI 04/17/2018 04/05/2018 03/23/2018  WEIGHT 205 lb 202 lb 205 lb 12.8 oz  HEIGHT 5' 8"  5' 8"  5' 8"   BMI 31.17 kg/m2 30.71 kg/m2 31.29 kg/m2  Some encounter information is confidential and restricted. Go to Review Flowsheets activity to see all data.      GERD Controlled, no change in medication   Crohn's disease of ileum with complication ([name]) Followed by GI, no flares since last visit, no hospitalizations

## 2018-04-21 NOTE — Assessment & Plan Note (Signed)
Managed by Psychiatry and well controlled on current medication, score of 3, no medications change needed

## 2018-04-23 ENCOUNTER — Telehealth: Payer: Self-pay

## 2018-04-23 NOTE — Telephone Encounter (Signed)
PA for Omeprazole 20 mg was submitted through [email or web address]

## 2018-04-29 ENCOUNTER — Other Ambulatory Visit: Payer: Self-pay | Admitting: Family Medicine

## 2018-05-02 ENCOUNTER — Other Ambulatory Visit: Payer: Self-pay

## 2018-05-02 ENCOUNTER — Encounter: Payer: Self-pay | Admitting: Family Medicine

## 2018-05-02 MED ORDER — BUPROPION HCL ER (XL) 150 MG PO TB24: 150.0000 mg | ORAL_TABLET | Freq: Two times a day (BID) | ORAL | 2 refills | Status: DC

## 2018-05-02 MED ORDER — POTASSIUM CHLORIDE CRYS ER 20 MEQ PO TBCR: 20.0000 meq | EXTENDED_RELEASE_TABLET | Freq: Two times a day (BID) | ORAL | 2 refills | Status: DC

## 2018-05-07 ENCOUNTER — Telehealth: Payer: Self-pay | Admitting: Internal Medicine

## 2018-05-07 ENCOUNTER — Encounter: Payer: Self-pay | Admitting: Family Medicine

## 2018-05-07 ENCOUNTER — Telehealth: Payer: Self-pay

## 2018-05-07 DIAGNOSIS — J3089 Other allergic rhinitis: Secondary | ICD-10-CM

## 2018-05-07 MED ORDER — MONTELUKAST SODIUM 10 MG PO TABS: ORAL_TABLET | ORAL | 2 refills | Status: DC

## 2018-05-07 NOTE — Telephone Encounter (Signed)
PATIENT CALLED AND SAID SHE IS OUT OF HER PRESCRIPTIONS AND NEEDS THEM SENT TO HER NEW PHARMACY [facility] IN [place].  OUT OF ALL BUT HUMIRA  [phone]

## 2018-05-07 NOTE — Telephone Encounter (Signed)
Patient sent a MyChart message stating that she had No medication. Called patient back and went over what medication she was in need of. Potassium (refilled on 2-26), Wellbutrin (refilled on 2-26) and the singulair I refilled today for her.

## 2018-05-07 NOTE — Telephone Encounter (Signed)
Spoke with Pt. Omeprazole was sent in 04/16/18. Will call pts insurance.

## 2018-05-07 NOTE — Telephone Encounter (Signed)
Spoke with pts insurance company and they denied the PA submitted originally because it was the generic name and it was written for more than once a day. The asked if I could fax a new pa and write DAW on it. Pt is aware. Will call pt's insurance company in the morning to see if they received my fax.

## 2018-05-08 NOTE — Telephone Encounter (Signed)
Called [employer] [place], they received fax submitted yesterday 05/07/18. PA is pending.

## 2018-05-10 NOTE — Telephone Encounter (Signed)
Received a denial letter for the brand name Prilosec. States that pt is taking medication twice instead of once daily. The plan covers once daily. I'm going to send pt a goodrx coupon to see if it helps her. AB do you want to do an appeal?

## 2018-05-11 NOTE — Telephone Encounter (Signed)
If they are just wanting generic, then generic is fine. Otherwise, continue with coverage from plan and supplement with Good Rx for the additional dose. Also, did she get the Humira trough and antibodies done?

## 2018-05-14 NOTE — Telephone Encounter (Signed)
Spoke with AB and she is aware that [place] isn't covered due to it being taken twice daily. Per AB medication is also OTC and can purchased or pt can use the GoodRx coupon mailed to her.

## 2018-05-16 ENCOUNTER — Encounter: Payer: Self-pay | Admitting: Family Medicine

## 2018-05-16 ENCOUNTER — Other Ambulatory Visit: Payer: Self-pay | Admitting: Family Medicine

## 2018-05-16 MED ORDER — BUPROPION HCL ER (XL) 300 MG PO TB24: 300.0000 mg | ORAL_TABLET | Freq: Every day | ORAL | 3 refills | Status: DC

## 2018-05-24 ENCOUNTER — Other Ambulatory Visit: Payer: Self-pay

## 2018-05-30 ENCOUNTER — Other Ambulatory Visit: Payer: Self-pay | Admitting: Family Medicine

## 2018-06-14 ENCOUNTER — Encounter: Payer: Self-pay | Admitting: Family Medicine

## 2018-06-14 ENCOUNTER — Telehealth: Payer: BLUE CROSS/BLUE SHIELD | Admitting: Physician Assistant

## 2018-06-14 DIAGNOSIS — R519 Headache, unspecified: Secondary | ICD-10-CM

## 2018-06-14 DIAGNOSIS — R51 Headache: Principal | ICD-10-CM

## 2018-06-14 NOTE — Progress Notes (Signed)
Based on what you shared with me, I feel your condition warrants further evaluation and I recommend that you be seen for a face to face office visit.  It is hard to distinguish exact cause if symptoms. Could potentially be sinus headache or could be from TMJ dysfunction or a more serious cause of headache. As such you need assessment in person at Urgent Care setting.    NOTE: If you entered your credit card information for this eVisit, you will not be charged. You may see a "hold" on your card for the $35 but that hold will drop off and you will not have a charge processed.  If you are having a true medical emergency please call 911.  If you need an urgent face to face visit, [facility] has four urgent care centers for your convenience.    PLEASE NOTE: THE INSTACARE LOCATIONS AND URGENT CARE CLINICS DO NOT HAVE THE TESTING FOR CORONAVIRUS COVID19 AVAILABLE.  IF YOU FEEL YOU NEED THIS TEST YOU MUST GO TO A TRIAGE LOCATION AT [facility]   [email or web address] to reserve your spot online an avoid wait times  [facility] [address] [place], [place] [address] Modified hours of operation: Monday-Friday, 10 AM to 6 PM  Saturday & Sunday 10 AM to 4 PM *Across the street from [place] (New Address!) [address] [place], [place] [address] *Just off [address], across the road from [place] hours of operation: Monday-Friday, 10 AM to 5 PM  Closed Saturday & Sunday   The following sites will take your insurance:  . [facility] [place] a Provider at this Location  [address] [place], [place] [address] . 10 am to 8 pm Monday-Friday . 12 pm to 8 pm Saturday-Sunday   . [facility] at [place] a Provider at this Location  [place], [place], [place] [address] . 8 am to 8 pm Monday-Friday . 9 am to 6 pm Saturday . 11 am to 6 pm Sunday   . [facility] at [place] Get Driving Directions  [address] [place], [place] [address] . 8 am to 8 pm Monday-Friday . 8 am to 4 pm Saturday-Sunday   Your e-visit answers were reviewed by a board certified advanced clinical practitioner to complete your personal care plan.  Thank you for using e-Visits.

## 2018-06-16 ENCOUNTER — Telehealth: Payer: BLUE CROSS/BLUE SHIELD | Admitting: Nurse Practitioner

## 2018-06-16 DIAGNOSIS — J0100 Acute maxillary sinusitis, unspecified: Secondary | ICD-10-CM

## 2018-06-16 MED ORDER — AMOXICILLIN-POT CLAVULANATE 875-125 MG PO TABS: 1.0000 | ORAL_TABLET | Freq: Two times a day (BID) | ORAL | 0 refills | Status: DC

## 2018-06-16 NOTE — Progress Notes (Signed)
We are sorry that you are not feeling well.  Here is how we plan to help!  Based on what you have shared with me it looks like you have sinusitis.  Sinusitis is inflammation and infection in the sinus cavities of the head.  Based on your presentation I believe you most likely have Acute Bacterial Sinusitis.  This is an infection caused by bacteria and is treated with antibiotics. I have prescribed Augmentin 852m/125mg one tablet twice daily with food, for 7 days. You may use an oral decongestant such as Mucinex D or if you have glaucoma or high blood pressure use plain Mucinex. Saline nasal spray help and can safely be used as often as needed for congestion.  If you develop worsening sinus pain, fever or notice severe headache and vision changes, or if symptoms are not better after completion of antibiotic, please schedule an appointment with a health care provider.    Sinus infections are not as easily transmitted as other respiratory infection, however we still recommend that you avoid close contact with loved ones, especially the very young and elderly.  Remember to wash your hands thoroughly throughout the day as this is the number one way to prevent the spread of infection!  Home Care:  Only take medications as instructed by your medical team.  Complete the entire course of an antibiotic.  Do not take these medications with alcohol.  A steam or ultrasonic humidifier can help congestion.  You can place a towel over your head and breathe in the steam from hot water coming from a faucet.  Avoid close contacts especially the very young and the elderly.  Cover your mouth when you cough or sneeze.  Always remember to wash your hands.  Get Help Right Away If:  You develop worsening fever or sinus pain.  You develop a severe head ache or visual changes.  Your symptoms persist after you have completed your treatment plan.  Make sure you  Understand these instructions.  Will watch your  condition.  Will get help right away if you are not doing well or get worse.  Your e-visit answers were reviewed by a board certified advanced clinical practitioner to complete your personal care plan.  Depending on the condition, your plan could have included both over the counter or prescription medications.  If there is a problem please reply  once you have received a response from your provider.  Your safety is important to [place]  If you have drug allergies check your prescription carefully.    You can use MyChart to ask questions about today's visit, request a non-urgent call back, or ask for a work or school excuse for 24 hours related to this e-Visit. If it has been greater than 24 hours you will need to follow up with your provider, or enter a new e-Visit to address those concerns.  You will get an e-mail in the next two days asking about your experience.  I hope that your e-visit has been valuable and will speed your recovery. Thank you for using e-visits.   5 minutes spent reviewing and documenting in chart.

## 2018-06-19 MED ORDER — ONDANSETRON HCL 4 MG PO TABS: 4.0000 mg | ORAL_TABLET | Freq: Three times a day (TID) | ORAL | 1 refills | Status: DC | PRN

## 2018-06-19 NOTE — Telephone Encounter (Signed)
Needs appt

## 2018-06-28 ENCOUNTER — Other Ambulatory Visit: Payer: Self-pay

## 2018-06-28 ENCOUNTER — Inpatient Hospital Stay (HOSPITAL_COMMUNITY): Payer: BLUE CROSS/BLUE SHIELD | Attending: Hematology

## 2018-06-28 DIAGNOSIS — D72829 Elevated white blood cell count, unspecified: Secondary | ICD-10-CM | POA: Diagnosis not present

## 2018-06-28 DIAGNOSIS — D508 Other iron deficiency anemias: Secondary | ICD-10-CM | POA: Insufficient documentation

## 2018-06-28 DIAGNOSIS — Z79899 Other long term (current) drug therapy: Secondary | ICD-10-CM | POA: Diagnosis not present

## 2018-06-28 DIAGNOSIS — N926 Irregular menstruation, unspecified: Secondary | ICD-10-CM | POA: Diagnosis not present

## 2018-06-28 LAB — CBC WITH DIFFERENTIAL/PLATELET
Abs Immature Granulocytes: 0.01 10*3/uL (ref 0.00–0.07)
Basophils Absolute: 0.1 10*3/uL (ref 0.0–0.1)
Basophils Relative: 1 %
Eosinophils Absolute: 0.3 10*3/uL (ref 0.0–0.5)
Eosinophils Relative: 3 %
HCT: 37.8 % (ref 36.0–46.0)
Hemoglobin: 12.3 g/dL (ref 12.0–15.0)
Immature Granulocytes: 0 %
Lymphocytes Relative: 56 %
Lymphs Abs: 5.7 10*3/uL — ABNORMAL HIGH (ref 0.7–4.0)
MCH: 27.8 pg (ref 26.0–34.0)
MCHC: 32.5 g/dL (ref 30.0–36.0)
MCV: 85.3 fL (ref 80.0–100.0)
Monocytes Absolute: 0.6 10*3/uL (ref 0.1–1.0)
Monocytes Relative: 6 %
Neutro Abs: 3.5 10*3/uL (ref 1.7–7.7)
Neutrophils Relative %: 34 %
Platelets: 362 10*3/uL (ref 150–400)
RBC: 4.43 MIL/uL (ref 3.87–5.11)
RDW: 15 % (ref 11.5–15.5)
WBC: 10.2 10*3/uL (ref 4.0–10.5)
nRBC: 0 % (ref 0.0–0.2)

## 2018-06-28 LAB — COMPREHENSIVE METABOLIC PANEL
ALT: 107 U/L — ABNORMAL HIGH (ref 0–44)
AST: 36 U/L (ref 15–41)
Albumin: 3.2 g/dL — ABNORMAL LOW (ref 3.5–5.0)
Alkaline Phosphatase: 76 U/L (ref 38–126)
Anion gap: 7 (ref 5–15)
BUN: 8 mg/dL (ref 6–20)
CO2: 25 mmol/L (ref 22–32)
Calcium: 8.7 mg/dL — ABNORMAL LOW (ref 8.9–10.3)
Chloride: 106 mmol/L (ref 98–111)
Creatinine, Ser: 0.77 mg/dL (ref 0.44–1.00)
GFR calc Af Amer: >60 mL/min (ref >60)
GFR calc non Af Amer: >60 mL/min (ref >60)
Glucose, Bld: 123 mg/dL — ABNORMAL HIGH (ref 70–99)
Potassium: 3.7 mmol/L (ref 3.5–5.1)
Sodium: 138 mmol/L (ref 135–145)
Total Bilirubin: 0.3 mg/dL (ref 0.3–1.2)
Total Protein: 7.2 g/dL (ref 6.5–8.1)

## 2018-06-28 LAB — IRON AND TIBC
Iron: 63 ug/dL (ref 28–170)
Saturation Ratios: 19 % (ref 10.4–31.8)
TIBC: 338 ug/dL (ref 250–450)
UIBC: 275 ug/dL

## 2018-06-28 LAB — VITAMIN B12: Vitamin B-12: 2490 pg/mL — ABNORMAL HIGH (ref 180–914)

## 2018-06-28 LAB — FERRITIN: Ferritin: 40 ng/mL (ref 11–307)

## 2018-06-28 LAB — FOLATE: Folate: 31 ng/mL (ref >5.9)

## 2018-06-29 ENCOUNTER — Other Ambulatory Visit: Payer: Self-pay | Admitting: Family Medicine

## 2018-07-05 ENCOUNTER — Inpatient Hospital Stay (HOSPITAL_BASED_OUTPATIENT_CLINIC_OR_DEPARTMENT_OTHER): Payer: BLUE CROSS/BLUE SHIELD | Admitting: Nurse Practitioner

## 2018-07-05 ENCOUNTER — Other Ambulatory Visit: Payer: Self-pay

## 2018-07-05 DIAGNOSIS — D508 Other iron deficiency anemias: Secondary | ICD-10-CM

## 2018-07-05 DIAGNOSIS — D72829 Elevated white blood cell count, unspecified: Secondary | ICD-10-CM | POA: Diagnosis not present

## 2018-07-05 DIAGNOSIS — Z79899 Other long term (current) drug therapy: Secondary | ICD-10-CM | POA: Diagnosis not present

## 2018-07-05 NOTE — Addendum Note (Signed)
Addended by: Glennie Isle on: 07/05/2018 02:42 PM   Modules accepted: Orders

## 2018-07-05 NOTE — Progress Notes (Signed)
Virtual Visit via Telephone Note  I connected with [name] on 07/05/18 at 11:30 AM EDT by telephone and verified that I am speaking with the correct person using two identifiers.   I discussed the limitations, risks, security and privacy concerns of performing an evaluation and management service by telephone and the availability of in person appointments. I also discussed with the patient that there may be a patient responsible charge related to this service. The patient expressed understanding and agreed to proceed.   History of Present Illness: -Patient has been doing great since her last visit 8 months ago.  She has no complaints at this time she denies any bleeding per rectum or melena.  She denies any easy bruising.  She has been taking her Niferex 1 mg tablet daily without any major problems. Denies any nausea, vomiting, or diarrhea. Denies any new pains. Had not noticed any recent bleeding such as epistaxis, hematuria or hematochezia. Denies recent chest pain on exertion, shortness of breath on minimal exertion, pre-syncopal episodes, or palpitations. Denies any numbness or tingling in hands or feet. Denies any recent fevers, infections, or recent hospitalizations. Patient reports appetite at 50% and energy level at 50%.  She is eating well and maintaining her weight at this time.    Observations/Objective: -She was alert and oriented on the phone.  She was in no distress. - She was answering questions appropriately. -Physical assessment deferred due to telephone to visit.  Assessment and Plan: 1.  Iron deficiency anemia: - This is likely due from chronic inflammation and blood loss.  She also has irregular menses. - Patient last had Feraheme on 09/14/2015 where she had an anaphylactic to the [facility]. - If she needs IV iron in the future we will try Injectafer. -She takes Niferex 1 mg tablet daily with no side effects. - Labs on 06/28/2018 showed her hemoglobin at 12.3, ferritin  40, percent saturation 19. - She will continue taking the iron tablets daily she is maintaining her hemoglobin at this time so we will continue on the same path. -We will follow-up in 6 months with repeat labs . 2.  Leukocytosis: - When she was first seen her white blood counts were 20 she was on antibiotics and prednisone at that time. -We repeated her WBCs with her labs on 06/28/2018 her WBC was 10.2. - She is on antibiotics for a sinus infection. -We will repeat these labs in 6 months.  Follow Up Instructions: - Continue taking oral iron. -Follow-up in 6 months with repeat labs.   I discussed the assessment and treatment plan with the patient. The patient was provided an opportunity to ask questions and all were answered. The patient agreed with the plan and demonstrated an understanding of the instructions.   The patient was advised to call back or seek an in-person evaluation if the symptoms worsen or if the condition fails to improve as anticipated.  I provided 20 minutes of non-face-to-face time during this encounter.   Glennie Isle, NP-C

## 2018-07-05 NOTE — Assessment & Plan Note (Signed)
1.  Iron deficiency anemia: - This is likely due from chronic inflammation and blood loss.  She also has irregular menses. - Patient last had Feraheme on 09/14/2015 where she had an anaphylactic to the [facility]. - If she needs IV iron in the future we will try Injectafer. -She takes Niferex 1 mg tablet daily with no side effects. - Labs on 06/28/2018 showed her hemoglobin at 12.3, ferritin 40, percent saturation 19. - She will continue taking the iron tablets daily she is maintaining her hemoglobin at this time so we will continue on the same path. -We will follow-up in 6 months with repeat labs . 2.  Leukocytosis: - When she was first seen her white blood counts were 20 she was on antibiotics and prednisone at that time. -We repeated her WBCs with her labs on 06/28/2018 her WBC was 10.2. - She is on antibiotics for a sinus infection. -We will repeat these labs in 6 months.

## 2018-07-12 ENCOUNTER — Telehealth: Payer: Self-pay | Admitting: *Deleted

## 2018-07-12 NOTE — Telephone Encounter (Signed)
Pt called and scheduled for AWV 5-14 at 1:40

## 2018-07-18 ENCOUNTER — Encounter: Payer: Self-pay | Admitting: Family Medicine

## 2018-07-19 ENCOUNTER — Ambulatory Visit (INDEPENDENT_AMBULATORY_CARE_PROVIDER_SITE_OTHER): Payer: BLUE CROSS/BLUE SHIELD | Admitting: Family Medicine

## 2018-07-19 ENCOUNTER — Other Ambulatory Visit: Payer: Self-pay

## 2018-07-19 ENCOUNTER — Encounter: Payer: Self-pay | Admitting: Family Medicine

## 2018-07-19 VITALS — BP 120/71 | Ht 68.0 in | Wt 205.0 lb

## 2018-07-19 DIAGNOSIS — Z0000 Encounter for general adult medical examination without abnormal findings: Secondary | ICD-10-CM | POA: Diagnosis not present
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
## 2018-07-19 NOTE — Patient Instructions (Signed)
[name] , Thank you for taking time to come for your Medicare Wellness Visit. I appreciate your ongoing commitment to your health goals. Please review the following plan we discussed and let me know if I can assist you in the future.   Screening recommendations/referrals: Colonoscopy: Start at age 49 Mammogram: Next one due in 2020 Bone Density: Start at age 59 or sooner if needed Recommended yearly ophthalmology/optometry visit for glaucoma screening and checkup Recommended yearly dental visit for hygiene and checkup  Vaccinations: Influenza vaccine: Due in fall 2020 Pneumococcal vaccine: Completed-23 Tdap vaccine: Current until 2021 Shingles vaccine: Recommended for patients over the age of 2, usually insurance does not cover until you are over 70  Advanced directives: Discussed with Dr. Moshe Cipro in the office.  Information and paperwork can be provided there  Conditions/risks identified: Below is a list of things that could help you prevent any injuries or illness in the future.  Please review the information  Next appointment: October 17 of 2020 at 1020 annual physical with Dr. Moshe Cipro  Preventive Care 40-64 Years, Female Preventive care refers to lifestyle choices and visits with your health care provider that can promote health and wellness. What does preventive care include?  A yearly physical exam. This is also called an annual well check.  Dental exams once or twice a year.  Routine eye exams. Ask your health care provider how often you should have your eyes checked.  Personal lifestyle choices, including:  Daily care of your teeth and gums.  Regular physical activity.  Eating a healthy diet.  Avoiding tobacco and drug use.  Limiting alcohol use.  Practicing safe sex.  Taking low-dose aspirin daily starting at age 34.  Taking vitamin and mineral supplements as recommended by your health care provider. What happens during an annual well check? The services  and screenings done by your health care provider during your annual well check will depend on your age, overall health, lifestyle risk factors, and family history of disease. Counseling  Your health care provider may ask you questions about your:  Alcohol use.  Tobacco use.  Drug use.  Emotional well-being.  Home and relationship well-being.  Sexual activity.  Eating habits.  Work and work Statistician.  Method of birth control.  Menstrual cycle.  Pregnancy history. Screening  You may have the following tests or measurements:  Height, weight, and BMI.  Blood pressure.  Lipid and cholesterol levels. These may be checked every 5 years, or more frequently if you are over 36 years old.  Skin check.  Lung cancer screening. You may have this screening every year starting at age 85 if you have a 30-pack-year history of smoking and currently smoke or have quit within the past 15 years.  Fecal occult blood test (FOBT) of the stool. You may have this test every year starting at age 41.  Flexible sigmoidoscopy or colonoscopy. You may have a sigmoidoscopy every 5 years or a colonoscopy every 10 years starting at age 58.  Hepatitis C blood test.  Hepatitis B blood test.  Sexually transmitted disease (STD) testing.  Diabetes screening. This is done by checking your blood sugar (glucose) after you have not eaten for a while (fasting). You may have this done every 1-3 years.  Mammogram. This may be done every 1-2 years. Talk to your health care provider about when you should start having regular mammograms. This may depend on whether you have a family history of breast cancer.  BRCA-related cancer screening. This  may be done if you have a family history of breast, ovarian, tubal, or peritoneal cancers.  Pelvic exam and Pap test. This may be done every 3 years starting at age 34. Starting at age 70, this may be done every 5 years if you have a Pap test in combination with an HPV  test.  Bone density scan. This is done to screen for osteoporosis. You may have this scan if you are at high risk for osteoporosis. Discuss your test results, treatment options, and if necessary, the need for more tests with your health care provider. Vaccines  Your health care provider may recommend certain vaccines, such as:  Influenza vaccine. This is recommended every year.  Tetanus, diphtheria, and acellular pertussis (Tdap, Td) vaccine. You may need a Td booster every 10 years.  Zoster vaccine. You may need this after age 69.  Pneumococcal 13-valent conjugate (PCV13) vaccine. You may need this if you have certain conditions and were not previously vaccinated.  Pneumococcal polysaccharide (PPSV23) vaccine. You may need one or two doses if you smoke cigarettes or if you have certain conditions. Talk to your health care provider about which screenings and vaccines you need and how often you need them. This information is not intended to replace advice given to you by your health care provider. Make sure you discuss any questions you have with your health care provider. Document Released: 03/20/2015 Document Revised: 11/11/2015 Document Reviewed: 12/23/2014 Elsevier Interactive Patient Education  2017 [place] Prevention in the Home Falls can cause injuries. They can happen to people of all ages. There are many things you can do to make your home safe and to help prevent falls. What can I do on the outside of my home?  Regularly fix the edges of walkways and driveways and fix any cracks.  Remove anything that might make you trip as you walk through a door, such as a raised step or threshold.  Trim any bushes or trees on the path to your home.  Use bright outdoor lighting.  Clear any walking paths of anything that might make someone trip, such as rocks or tools.  Regularly check to see if handrails are loose or broken. Make sure that both sides of any steps have  handrails.  Any raised decks and porches should have guardrails on the edges.  Have any leaves, snow, or ice cleared regularly.  Use sand or salt on walking paths during winter.  Clean up any spills in your garage right away. This includes oil or grease spills. What can I do in the bathroom?  Use night lights.  Install grab bars by the toilet and in the tub and shower. Do not use towel bars as grab bars.  Use non-skid mats or decals in the tub or shower.  If you need to sit down in the shower, use a plastic, non-slip stool.  Keep the floor dry. Clean up any water that spills on the floor as soon as it happens.  Remove soap buildup in the tub or shower regularly.  Attach bath mats securely with double-sided non-slip rug tape.  Do not have throw rugs and other things on the floor that can make you trip. What can I do in the bedroom?  Use night lights.  Make sure that you have a light by your bed that is easy to reach.  Do not use any sheets or blankets that are too big for your bed. They should not hang down  onto the floor.  Have a firm chair that has side arms. You can use this for support while you get dressed.  Do not have throw rugs and other things on the floor that can make you trip. What can I do in the kitchen?  Clean up any spills right away.  Avoid walking on wet floors.  Keep items that you use a lot in easy-to-reach places.  If you need to reach something above you, use a strong step stool that has a grab bar.  Keep electrical cords out of the way.  Do not use floor polish or wax that makes floors slippery. If you must use wax, use non-skid floor wax.  Do not have throw rugs and other things on the floor that can make you trip. What can I do with my stairs?  Do not leave any items on the stairs.  Make sure that there are handrails on both sides of the stairs and use them. Fix handrails that are broken or loose. Make sure that handrails are as long as  the stairways.  Check any carpeting to make sure that it is firmly attached to the stairs. Fix any carpet that is loose or worn.  Avoid having throw rugs at the top or bottom of the stairs. If you do have throw rugs, attach them to the floor with carpet tape.  Make sure that you have a light switch at the top of the stairs and the bottom of the stairs. If you do not have them, ask someone to add them for you. What else can I do to help prevent falls?  Wear shoes that:  Do not have high heels.  Have rubber bottoms.  Are comfortable and fit you well.  Are closed at the toe. Do not wear sandals.  If you use a stepladder:  Make sure that it is fully opened. Do not climb a closed stepladder.  Make sure that both sides of the stepladder are locked into place.  Ask someone to hold it for you, if possible.  Clearly mark and make sure that you can see:  Any grab bars or handrails.  First and last steps.  Where the edge of each step is.  Use tools that help you move around (mobility aids) if they are needed. These include:  Canes.  Walkers.  Scooters.  Crutches.  Turn on the lights when you go into a dark area. Replace any light bulbs as soon as they burn out.  Set up your furniture so you have a clear path. Avoid moving your furniture around.  If any of your floors are uneven, fix them.  If there are any pets around you, be aware of where they are.  Review your medicines with your doctor. Some medicines can make you feel dizzy. This can increase your chance of falling. Ask your doctor what other things that you can do to help prevent falls. This information is not intended to replace advice given to you by your health care provider. Make sure you discuss any questions you have with your health care provider. Document Released: 12/18/2008 Document Revised: 07/30/2015 Document Reviewed: 03/28/2014 Elsevier Interactive Patient Education  2017 [employer].

## 2018-07-30 ENCOUNTER — Other Ambulatory Visit: Payer: Self-pay | Admitting: Family Medicine

## 2018-07-30 DIAGNOSIS — D509 Iron deficiency anemia, unspecified: Secondary | ICD-10-CM

## 2018-09-04 ENCOUNTER — Other Ambulatory Visit: Payer: Self-pay | Admitting: Family Medicine

## 2018-09-24 ENCOUNTER — Encounter: Payer: Self-pay | Admitting: Nurse Practitioner

## 2018-09-24 ENCOUNTER — Other Ambulatory Visit: Payer: Self-pay

## 2018-09-24 ENCOUNTER — Ambulatory Visit: Payer: Medicare HMO | Admitting: Nurse Practitioner

## 2018-09-24 VITALS — BP 127/74 | HR 91 | Temp 99.4°F | Ht 67.5 in | Wt 191.6 lb

## 2018-09-24 DIAGNOSIS — K5080 Crohn's disease of both small and large intestine without complications: Secondary | ICD-10-CM

## 2018-09-24 DIAGNOSIS — K219 Gastro-esophageal reflux disease without esophagitis: Secondary | ICD-10-CM
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
## 2018-09-24 NOTE — Patient Instructions (Signed)
Your health issues we discussed today were:   Crohn's disease: 1. Continue taking your current medications 2. Have your labs drawn when you can 3. Call patient assistance program ASAP regarding your change in insurance. 4. After you speak with patient assistance, follow-up with [place] with the results and anything needed from [place] to continue receiving your medication 5. Call us if you have any worsening or severe symptoms  GERD (reflux/heartburn) and nausea: 1. Continue taking your current acid blocker twice a day 2. Call us if you have any worsening or severe symptoms  Overall I recommend:  1. Continue your other current medications 2. Call us if you have any questions or concerns 3. Follow-up in 6 months.   Because of recent events of COVID-19 ("Coronavirus"), follow CDC recommendations:  1. Wash your hand frequently 2. Avoid touching your face 3. Stay away from people who are sick 4. If you have symptoms such as fever, cough, shortness of breath then call your healthcare provider for further guidance 5. If you are sick, STAY AT HOME unless otherwise directed by your healthcare provider. 6. Follow directions from state and national officials regarding staying safe   At [facility] we value your feedback. You may receive a survey about your visit today. Please share your experience as we strive to create trusting relationships with our patients to provide genuine, compassionate, quality care.  We appreciate your understanding and patience as we review any laboratory studies, imaging, and other diagnostic tests that are ordered as we care for you. Our office policy is 5 business days for review of these results, and any emergent or urgent results are addressed in a timely manner for your best interest. If you do not hear from our office in 1 week, please contact us.   We also encourage the use of MyChart, which contains your medical information for your review as well. If  you are not enrolled in this feature, an access code is on this after visit summary for your convenience. Thank you for allowing [place] to be involved in your care.  It was great to see you today!  I hope you have a great summer!!

## 2018-09-25 ENCOUNTER — Encounter: Payer: Self-pay | Admitting: Internal Medicine

## 2018-09-25 NOTE — Progress Notes (Signed)
CC'ED TO PCP 

## 2018-09-27 ENCOUNTER — Other Ambulatory Visit (HOSPITAL_COMMUNITY)
Admission: RE | Admit: 2018-09-27 | Discharge: 2018-09-27 | Disposition: A | Discharge status: Home / Self Care | Payer: Medicare HMO | Source: Ambulatory Visit | Attending: Nurse Practitioner | Admitting: Nurse Practitioner

## 2018-09-27 ENCOUNTER — Other Ambulatory Visit: Payer: Self-pay

## 2018-09-27 DIAGNOSIS — K5080 Crohn's disease of both small and large intestine without complications: Secondary | ICD-10-CM | POA: Insufficient documentation

## 2018-09-27 DIAGNOSIS — K219 Gastro-esophageal reflux disease without esophagitis: Secondary | ICD-10-CM | POA: Diagnosis present

## 2018-09-27 LAB — COMPREHENSIVE METABOLIC PANEL
ALT: 15 U/L (ref 0–44)
AST: 16 U/L (ref 15–41)
Albumin: 3.1 g/dL — ABNORMAL LOW (ref 3.5–5.0)
Alkaline Phosphatase: 60 U/L (ref 38–126)
Anion gap: 9 (ref 5–15)
BUN: 11 mg/dL (ref 6–20)
CO2: 21 mmol/L — ABNORMAL LOW (ref 22–32)
Calcium: 8.6 mg/dL — ABNORMAL LOW (ref 8.9–10.3)
Chloride: 106 mmol/L (ref 98–111)
Creatinine, Ser: 0.79 mg/dL (ref 0.44–1.00)
GFR calc Af Amer: >60 mL/min (ref >60)
GFR calc non Af Amer: >60 mL/min (ref >60)
Glucose, Bld: 119 mg/dL — ABNORMAL HIGH (ref 70–99)
Potassium: 4.4 mmol/L (ref 3.5–5.1)
Sodium: 136 mmol/L (ref 135–145)
Total Bilirubin: 0.3 mg/dL (ref 0.3–1.2)
Total Protein: 7 g/dL (ref 6.5–8.1)

## 2018-09-27 LAB — CBC WITH DIFFERENTIAL/PLATELET
Abs Immature Granulocytes: 0.03 10*3/uL (ref 0.00–0.07)
Basophils Absolute: 0.1 10*3/uL (ref 0.0–0.1)
Basophils Relative: 1 %
Eosinophils Absolute: 0.4 10*3/uL (ref 0.0–0.5)
Eosinophils Relative: 3 %
HCT: 38.9 % (ref 36.0–46.0)
Hemoglobin: 12.4 g/dL (ref 12.0–15.0)
Immature Granulocytes: 0 %
Lymphocytes Relative: 43 %
Lymphs Abs: 5.6 10*3/uL — ABNORMAL HIGH (ref 0.7–4.0)
MCH: 27.4 pg (ref 26.0–34.0)
MCHC: 31.9 g/dL (ref 30.0–36.0)
MCV: 86.1 fL (ref 80.0–100.0)
Monocytes Absolute: 0.8 10*3/uL (ref 0.1–1.0)
Monocytes Relative: 6 %
Neutro Abs: 6.1 10*3/uL (ref 1.7–7.7)
Neutrophils Relative %: 47 %
Platelets: 439 10*3/uL — ABNORMAL HIGH (ref 150–400)
RBC: 4.52 MIL/uL (ref 3.87–5.11)
RDW: 15.9 % — ABNORMAL HIGH (ref 11.5–15.5)
WBC: 13 10*3/uL — ABNORMAL HIGH (ref 4.0–10.5)
nRBC: 0 % (ref 0.0–0.2)

## 2018-10-04 ENCOUNTER — Other Ambulatory Visit: Payer: Self-pay | Admitting: Family Medicine

## 2018-10-12 ENCOUNTER — Other Ambulatory Visit: Payer: Self-pay

## 2018-10-12 ENCOUNTER — Other Ambulatory Visit: Payer: Self-pay | Admitting: Family Medicine

## 2018-10-12 DIAGNOSIS — Z8639 Personal history of other endocrine, nutritional and metabolic disease: Secondary | ICD-10-CM

## 2018-10-15 ENCOUNTER — Other Ambulatory Visit: Payer: Self-pay | Admitting: Family Medicine

## 2018-10-23 ENCOUNTER — Other Ambulatory Visit: Payer: Self-pay

## 2018-10-23 DIAGNOSIS — Z8639 Personal history of other endocrine, nutritional and metabolic disease: Secondary | ICD-10-CM

## 2018-11-05 ENCOUNTER — Other Ambulatory Visit: Payer: Self-pay | Admitting: Family Medicine

## 2018-11-20 ENCOUNTER — Telehealth: Payer: Self-pay | Admitting: Internal Medicine

## 2018-11-20 NOTE — Telephone Encounter (Signed)
[phone] please call patient about her labs, supposed to have them today and can not find her order

## 2018-11-20 NOTE — Telephone Encounter (Signed)
Left a detailed message for pt. Pt's lab orders were placed for [facility] and pt doesn't need her order in her hand if it was misplaced.

## 2018-11-22 DIAGNOSIS — Z8639 Personal history of other endocrine, nutritional and metabolic disease: Secondary | ICD-10-CM | POA: Diagnosis not present

## 2018-11-22 LAB — COMPLETE METABOLIC PANEL WITH GFR
AG Ratio: 1 (calc) (ref 1.0–2.5)
ALT: 15 U/L (ref 6–29)
AST: 16 U/L (ref 10–35)
Albumin: 3.5 g/dL — ABNORMAL LOW (ref 3.6–5.1)
Alkaline phosphatase (APISO): 66 U/L (ref 31–125)
BUN: 11 mg/dL (ref 7–25)
CO2: 29 mmol/L (ref 20–32)
Calcium: 9.1 mg/dL (ref 8.6–10.2)
Chloride: 103 mmol/L (ref 98–110)
Creat: 0.79 mg/dL (ref 0.50–1.10)
GFR, Est African American: 103 mL/min/{1.73_m2} (ref >=60)
GFR, Est Non African American: 89 mL/min/{1.73_m2} (ref >=60)
Globulin: 3.4 g/dL (calc) (ref 1.9–3.7)
Glucose, Bld: 78 mg/dL (ref 65–99)
Potassium: 4.9 mmol/L (ref 3.5–5.3)
Sodium: 139 mmol/L (ref 135–146)
Total Bilirubin: 0.3 mg/dL (ref 0.2–1.2)
Total Protein: 6.9 g/dL (ref 6.1–8.1)

## 2018-11-22 LAB — CBC WITH DIFFERENTIAL/PLATELET
Absolute Monocytes: 756 cells/uL (ref 200–950)
Basophils Absolute: 84 cells/uL (ref 0–200)
Basophils Relative: 0.7 %
Eosinophils Absolute: 600 cells/uL — ABNORMAL HIGH (ref 15–500)
Eosinophils Relative: 5 %
HCT: 37.6 % (ref 35.0–45.0)
Hemoglobin: 12.2 g/dL (ref 11.7–15.5)
Lymphs Abs: 5436 cells/uL — ABNORMAL HIGH (ref 850–3900)
MCH: 27.4 pg (ref 27.0–33.0)
MCHC: 32.4 g/dL (ref 32.0–36.0)
MCV: 84.5 fL (ref 80.0–100.0)
MPV: 10.1 fL (ref 7.5–12.5)
Monocytes Relative: 6.3 %
Neutro Abs: 5124 cells/uL (ref 1500–7800)
Neutrophils Relative %: 42.7 %
Platelets: 445 10*3/uL — ABNORMAL HIGH (ref 140–400)
RBC: 4.45 10*6/uL (ref 3.80–5.10)
RDW: 13.8 % (ref 11.0–15.0)
Total Lymphocyte: 45.3 %
WBC: 12 10*3/uL — ABNORMAL HIGH (ref 3.8–10.8)

## 2018-11-28 ENCOUNTER — Telehealth: Payer: Self-pay | Admitting: Nurse Practitioner

## 2018-11-28 NOTE — Telephone Encounter (Signed)
Please tell the patient that her lab results show she has adequate medication in her system with the weekly dosing. Also, her body has not developed any antibodies to the medication.  Call if any questions or concerns.

## 2018-11-28 NOTE — Telephone Encounter (Signed)
Pt notified of results per EG.

## 2018-12-04 ENCOUNTER — Other Ambulatory Visit: Payer: Self-pay | Admitting: Family Medicine

## 2018-12-05 ENCOUNTER — Telehealth: Payer: Self-pay | Admitting: *Deleted

## 2018-12-05 DIAGNOSIS — R7301 Impaired fasting glucose: Secondary | ICD-10-CM

## 2018-12-05 DIAGNOSIS — Z13220 Encounter for screening for lipoid disorders: Secondary | ICD-10-CM

## 2018-12-05 DIAGNOSIS — E669 Obesity, unspecified: Secondary | ICD-10-CM

## 2018-12-05 NOTE — Telephone Encounter (Signed)
Labs ordered.

## 2018-12-05 NOTE — Telephone Encounter (Signed)
[facility] this morning but the order expired. She is going first thing in the morning to have them drawn can a new order be sent

## 2018-12-06 ENCOUNTER — Encounter: Payer: Self-pay | Admitting: Family Medicine

## 2018-12-06 DIAGNOSIS — R7301 Impaired fasting glucose: Secondary | ICD-10-CM | POA: Diagnosis not present

## 2018-12-06 DIAGNOSIS — E669 Obesity, unspecified: Secondary | ICD-10-CM | POA: Diagnosis not present

## 2018-12-06 DIAGNOSIS — Z13220 Encounter for screening for lipoid disorders: Secondary | ICD-10-CM | POA: Diagnosis not present

## 2018-12-07 ENCOUNTER — Encounter: Payer: Self-pay | Admitting: Family Medicine

## 2018-12-07 LAB — LIPID PANEL
Cholesterol: 122 mg/dL (ref <200)
HDL: 44 mg/dL — ABNORMAL LOW (ref >=50)
LDL Cholesterol (Calc): 61 mg/dL (calc)
Non-HDL Cholesterol (Calc): 78 mg/dL (calc) (ref <130)
Total CHOL/HDL Ratio: 2.8 (calc) (ref <5.0)
Triglycerides: 89 mg/dL (ref <150)

## 2018-12-07 LAB — COMPLETE METABOLIC PANEL WITH GFR
AG Ratio: 0.9 (calc) — ABNORMAL LOW (ref 1.0–2.5)
ALT: 24 U/L (ref 6–29)
AST: 20 U/L (ref 10–35)
Albumin: 3.2 g/dL — ABNORMAL LOW (ref 3.6–5.1)
Alkaline phosphatase (APISO): 74 U/L (ref 31–125)
BUN: 10 mg/dL (ref 7–25)
CO2: 25 mmol/L (ref 20–32)
Calcium: 8.6 mg/dL (ref 8.6–10.2)
Chloride: 105 mmol/L (ref 98–110)
Creat: 0.82 mg/dL (ref 0.50–1.10)
GFR, Est African American: 98 mL/min/{1.73_m2} (ref >=60)
GFR, Est Non African American: 85 mL/min/{1.73_m2} (ref >=60)
Globulin: 3.5 g/dL (calc) (ref 1.9–3.7)
Glucose, Bld: 76 mg/dL (ref 65–99)
Potassium: 4.4 mmol/L (ref 3.5–5.3)
Sodium: 137 mmol/L (ref 135–146)
Total Bilirubin: 0.2 mg/dL (ref 0.2–1.2)
Total Protein: 6.7 g/dL (ref 6.1–8.1)

## 2018-12-07 LAB — HEMOGLOBIN A1C
Hgb A1c MFr Bld: 5 % of total Hgb (ref <5.7)
Mean Plasma Glucose: 97 (calc)
eAG (mmol/L): 5.4 (calc)

## 2018-12-07 LAB — TSH: TSH: 1.63 mIU/L

## 2018-12-12 ENCOUNTER — Encounter: Payer: Self-pay | Admitting: Family Medicine

## 2018-12-12 ENCOUNTER — Ambulatory Visit (INDEPENDENT_AMBULATORY_CARE_PROVIDER_SITE_OTHER): Payer: Medicare HMO | Admitting: Family Medicine

## 2018-12-12 ENCOUNTER — Other Ambulatory Visit: Payer: Self-pay

## 2018-12-12 VITALS — BP 120/70 | HR 86 | Temp 97.3°F | Resp 15 | Ht 68.0 in | Wt 186.0 lb

## 2018-12-12 DIAGNOSIS — Z0000 Encounter for general adult medical examination without abnormal findings: Secondary | ICD-10-CM | POA: Diagnosis not present

## 2018-12-12 DIAGNOSIS — Z1231 Encounter for screening mammogram for malignant neoplasm of breast: Secondary | ICD-10-CM

## 2018-12-12 NOTE — Patient Instructions (Signed)
F/U in office with MD in 6 months, call if you need me sooner  Please schedule mammogram at checkout  CONGRATS on excellent health habits which have resulted in markedly improved health  HAPPY BIRTHDAY!!!, and many, many more1  It is important that you exercise regularly at least 30 minutes 5 times a week. If you develop chest pain, have severe difficulty breathing, or feel very tired, stop exercising immediately and seek medical attention   Think about what you will eat, plan ahead. Choose " clean, green, fresh or frozen" over canned, processed or packaged foods which are more sugary, salty and fatty. 70 to 75% of food eaten should be vegetables and fruit. Three meals at set times with snacks allowed between meals, but they must be fruit or vegetables. Aim to eat over a 12 hour period , example 7 am to 7 pm, and STOP after  your last meal of the day. Drink water,generally about 64 ounces per day, no other drink is as healthy. Fruit juice is best enjoyed in a healthy way, by EATING the fruit. Thanks for choosing [facility], we consider it a privelige to serve you.

## 2018-12-12 NOTE — Assessment & Plan Note (Signed)

## 2018-12-14 ENCOUNTER — Encounter: Payer: Self-pay | Admitting: Family Medicine

## 2018-12-14 NOTE — Progress Notes (Signed)
    [name]     MRN: [id number]      DOB: 1970-04-15  HPI: Patient is in for annual physical exam. No other health concerns are expressed or addressed at the visit. Recent labs, if available  Immunization is reviewed , and  Is up to date Feels great, has changed diet , increased exercise and lost nearly 20 pounds in past 6 months PE: Pulse 86   Temp (!) 97.3 F (36.3 C) (Temporal)   Resp 15   Ht 5' 8"  (1.727 m)   Wt 186 lb (84.4 kg)   SpO2 98%   BMI 28.28 kg/m   Pleasant  female, alert and oriented x 3, in no cardio-pulmonary distress. Afebrile. HEENT No facial trauma or asymetry. Sinuses non tender.  Extra occullar muscles intact, . External ears normal,Neck: supple, no adenopathy,JVD or thyromegaly.No bruits.  Chest: Clear to ascultation bilaterally.No crackles or wheezes. Non tender to palpation  Breast: No asymetry,no masses or lumps. No tenderness. No nipple discharge or inversion. No axillary or supraclavicular adenopathy  Cardiovascular system; Heart sounds normal,  S1 and  S2 ,no S3.  No murmur, or thrill. Apical beat not displaced Peripheral pulses normal.  Abdomen: Soft, non tender, no organomegaly or masses. No bruits. Bowel sounds normal. No guarding, tenderness or rebound.   Musculoskeletal exam: Full ROM of spine, hips , shoulders and knees. No deformity ,swelling or crepitus noted. No muscle wasting or atrophy.   Neurologic: Cranial nerves 2 to 12 intact. Power, tone ,sensation and reflexes normal throughout. No disturbance in gait. No tremor.  Skin: Intact, no ulceration, erythema , scaling or rash noted. Pigmentation normal throughout  Psych; Normal mood and affect. Judgement and concentration normal   Assessment & Plan:  Annual physical exam Annual exam as documented. Counseling done  re healthy lifestyle involving commitment to 150 minutes exercise per week, heart healthy diet, and attaining healthy weight.The importance of  adequate sleep also discussed. Regular seat belt use and home safety, is also discussed. Changes in health habits are decided on by the patient with goals and time frames  set for achieving them. Immunization and cancer screening needs are specifically addressed at this visit.

## 2018-12-20 ENCOUNTER — Inpatient Hospital Stay (HOSPITAL_COMMUNITY): Admission: RE | Admit: 2018-12-20 | Payer: Medicare HMO | Source: Ambulatory Visit

## 2018-12-28 ENCOUNTER — Other Ambulatory Visit (HOSPITAL_COMMUNITY): Payer: BLUE CROSS/BLUE SHIELD

## 2018-12-31 ENCOUNTER — Inpatient Hospital Stay (HOSPITAL_COMMUNITY): Payer: Medicare HMO

## 2019-01-01 ENCOUNTER — Inpatient Hospital Stay (HOSPITAL_COMMUNITY): Payer: Medicare HMO | Attending: Nurse Practitioner

## 2019-01-01 ENCOUNTER — Other Ambulatory Visit: Payer: Self-pay

## 2019-01-01 DIAGNOSIS — D72829 Elevated white blood cell count, unspecified: Secondary | ICD-10-CM | POA: Diagnosis not present

## 2019-01-01 DIAGNOSIS — D508 Other iron deficiency anemias: Secondary | ICD-10-CM | POA: Insufficient documentation

## 2019-01-01 DIAGNOSIS — Z87891 Personal history of nicotine dependence: Secondary | ICD-10-CM | POA: Diagnosis not present

## 2019-01-01 DIAGNOSIS — N926 Irregular menstruation, unspecified: Secondary | ICD-10-CM | POA: Insufficient documentation

## 2019-01-01 DIAGNOSIS — R5383 Other fatigue: Secondary | ICD-10-CM | POA: Insufficient documentation

## 2019-01-01 DIAGNOSIS — E559 Vitamin D deficiency, unspecified: Secondary | ICD-10-CM | POA: Diagnosis not present

## 2019-01-01 LAB — COMPREHENSIVE METABOLIC PANEL
ALT: 17 U/L (ref 0–44)
AST: 16 U/L (ref 15–41)
Albumin: 3 g/dL — ABNORMAL LOW (ref 3.5–5.0)
Alkaline Phosphatase: 73 U/L (ref 38–126)
Anion gap: 7 (ref 5–15)
BUN: 14 mg/dL (ref 6–20)
CO2: 26 mmol/L (ref 22–32)
Calcium: 8.8 mg/dL — ABNORMAL LOW (ref 8.9–10.3)
Chloride: 104 mmol/L (ref 98–111)
Creatinine, Ser: 0.74 mg/dL (ref 0.44–1.00)
GFR calc Af Amer: >60 mL/min (ref >60)
GFR calc non Af Amer: >60 mL/min (ref >60)
Glucose, Bld: 84 mg/dL (ref 70–99)
Potassium: 4.6 mmol/L (ref 3.5–5.1)
Sodium: 137 mmol/L (ref 135–145)
Total Bilirubin: 0.3 mg/dL (ref 0.3–1.2)
Total Protein: 7.4 g/dL (ref 6.5–8.1)

## 2019-01-01 LAB — CBC WITH DIFFERENTIAL/PLATELET
Abs Immature Granulocytes: 0.02 10*3/uL (ref 0.00–0.07)
Basophils Absolute: 0.1 10*3/uL (ref 0.0–0.1)
Basophils Relative: 1 %
Eosinophils Absolute: 0.7 10*3/uL — ABNORMAL HIGH (ref 0.0–0.5)
Eosinophils Relative: 6 %
HCT: 38.9 % (ref 36.0–46.0)
Hemoglobin: 12.3 g/dL (ref 12.0–15.0)
Immature Granulocytes: 0 %
Lymphocytes Relative: 45 %
Lymphs Abs: 5.2 10*3/uL — ABNORMAL HIGH (ref 0.7–4.0)
MCH: 27.3 pg (ref 26.0–34.0)
MCHC: 31.6 g/dL (ref 30.0–36.0)
MCV: 86.3 fL (ref 80.0–100.0)
Monocytes Absolute: 0.8 10*3/uL (ref 0.1–1.0)
Monocytes Relative: 7 %
Neutro Abs: 4.7 10*3/uL (ref 1.7–7.7)
Neutrophils Relative %: 41 %
Platelets: 448 10*3/uL — ABNORMAL HIGH (ref 150–400)
RBC: 4.51 MIL/uL (ref 3.87–5.11)
RDW: 15.4 % (ref 11.5–15.5)
WBC: 11.5 10*3/uL — ABNORMAL HIGH (ref 4.0–10.5)
nRBC: 0 % (ref 0.0–0.2)

## 2019-01-01 LAB — FERRITIN: Ferritin: 24 ng/mL (ref 11–307)

## 2019-01-01 LAB — IRON AND TIBC
Iron: 67 ug/dL (ref 28–170)
Saturation Ratios: 19 % (ref 10.4–31.8)
TIBC: 357 ug/dL (ref 250–450)
UIBC: 290 ug/dL

## 2019-01-01 LAB — VITAMIN D 25 HYDROXY (VIT D DEFICIENCY, FRACTURES): Vit D, 25-Hydroxy: 27.22 ng/mL — ABNORMAL LOW (ref 30–100)

## 2019-01-01 LAB — VITAMIN B12: Vitamin B-12: 1169 pg/mL — ABNORMAL HIGH (ref 180–914)

## 2019-01-01 LAB — FOLATE: Folate: 22.2 ng/mL (ref >5.9)

## 2019-01-01 LAB — LACTATE DEHYDROGENASE: LDH: 108 U/L (ref 98–192)

## 2019-01-03 ENCOUNTER — Other Ambulatory Visit: Payer: Self-pay

## 2019-01-04 ENCOUNTER — Encounter (HOSPITAL_COMMUNITY): Payer: Self-pay | Admitting: Hematology

## 2019-01-04 ENCOUNTER — Inpatient Hospital Stay (HOSPITAL_BASED_OUTPATIENT_CLINIC_OR_DEPARTMENT_OTHER): Payer: Medicare HMO | Admitting: Hematology

## 2019-01-04 DIAGNOSIS — E559 Vitamin D deficiency, unspecified: Secondary | ICD-10-CM | POA: Diagnosis not present

## 2019-01-04 DIAGNOSIS — Z87891 Personal history of nicotine dependence: Secondary | ICD-10-CM | POA: Diagnosis not present

## 2019-01-04 DIAGNOSIS — D508 Other iron deficiency anemias: Secondary | ICD-10-CM | POA: Diagnosis not present

## 2019-01-04 DIAGNOSIS — D72829 Elevated white blood cell count, unspecified: Secondary | ICD-10-CM | POA: Diagnosis not present

## 2019-01-04 DIAGNOSIS — R5383 Other fatigue: Secondary | ICD-10-CM | POA: Diagnosis not present

## 2019-01-04 DIAGNOSIS — N926 Irregular menstruation, unspecified: Secondary | ICD-10-CM | POA: Diagnosis not present

## 2019-01-09 ENCOUNTER — Telehealth: Payer: Self-pay

## 2019-01-09 NOTE — Telephone Encounter (Signed)
PA form was received from [facility]. PA was completed via phone and approved through 03/06/2020. Pt was notified about approval. PA approval form will be scanned in chart once received.

## 2019-01-11 ENCOUNTER — Inpatient Hospital Stay (HOSPITAL_COMMUNITY): Payer: Medicare HMO | Attending: Nurse Practitioner

## 2019-01-11 ENCOUNTER — Other Ambulatory Visit: Payer: Self-pay

## 2019-01-11 ENCOUNTER — Encounter (HOSPITAL_COMMUNITY): Payer: Self-pay

## 2019-01-11 VITALS — BP 121/70 | HR 86 | Temp 97.7°F | Resp 18

## 2019-01-11 DIAGNOSIS — D508 Other iron deficiency anemias: Secondary | ICD-10-CM | POA: Diagnosis not present

## 2019-01-11 MED ORDER — SODIUM CHLORIDE 0.9 % IV SOLN
Freq: Once | INTRAVENOUS | Status: AC
  Administered 2019-01-11: 09:00:00 via INTRAVENOUS

## 2019-01-11 MED ORDER — FAMOTIDINE 20 MG PO TABS
20.0000 mg | ORAL_TABLET | Freq: Once | ORAL | Status: AC
  Administered 2019-01-11: 09:00:00 20 mg via ORAL
  Filled 2019-01-11: qty 1

## 2019-01-11 MED ORDER — SODIUM CHLORIDE 0.9 % IV SOLN
200.0000 mg | Freq: Once | INTRAVENOUS | Status: AC
  Administered 2019-01-11: 200 mg via INTRAVENOUS
  Filled 2019-01-11: qty 10

## 2019-01-11 MED ORDER — LORATADINE 10 MG PO TABS
10.0000 mg | ORAL_TABLET | Freq: Once | ORAL | Status: AC
  Administered 2019-01-11: 10 mg via ORAL
  Filled 2019-01-11: qty 1

## 2019-01-11 MED ORDER — ACETAMINOPHEN 325 MG PO TABS
650.0000 mg | ORAL_TABLET | Freq: Once | ORAL | Status: AC
  Administered 2019-01-11: 650 mg via ORAL
  Filled 2019-01-11: qty 2

## 2019-01-11 NOTE — Progress Notes (Signed)
01/11/19  Premedications for Venofer based on previous reaction with Feraheme:  Give Tylenol 650 mg po X 1, Claritin 10 mg po X 1, and Pepcid 20 mg po X 1 prior to Venofer.  Plan updated to reflect the above.  T.O. Reynolds Bowl NP-C/Kale Dols Ronnald Ramp, PharmD

## 2019-01-11 NOTE — Patient Instructions (Signed)
[facility]  Discharge Instructions:   _______________________________________________________________  Thank you for choosing [facility] to provide your oncology and hematology care.  To afford each patient quality time with our providers, please arrive at least 15 minutes before your scheduled appointment.  You need to re-schedule your appointment if you arrive 10 or more minutes late.  We strive to give you quality time with our providers, and arriving late affects you and other patients whose appointments are after yours.  Also, if you no show three or more times for appointments you may be dismissed from the clinic.  Again, thank you for choosing  [facility]. Our hope is that these requests will allow you access to exceptional care and in a timely manner. _______________________________________________________________  If you have questions after your visit, please contact our office at [phone] between the hours of 8:30 a.m. and 5:00 p.m. Voicemails left after 4:30 p.m. will not be returned until the following business day. _______________________________________________________________  For prescription refill requests, have your pharmacy contact our office. _______________________________________________________________  Recommendations made by the consultant and any test results will be sent to your referring physician. _______________________________________________________________ 

## 2019-01-11 NOTE — Progress Notes (Signed)
Pt presents today for Venofer infusion. VSS. Pt has no complaints of any changes since the last visit. Due to reaction to [facility] in the past. Pre-meds ordered today. MAR reviewed and updated.   Venofer infused today per MD orders. Tolerated infusion without adverse affects. Vital signs stable. No complaints at this time. Discharged from clinic ambulatory. F/U with [facility] as scheduled.

## 2019-01-17 ENCOUNTER — Other Ambulatory Visit: Payer: Self-pay

## 2019-01-17 MED ORDER — CYCLOBENZAPRINE HCL 10 MG PO TABS: ORAL_TABLET | ORAL | 0 refills | Status: DC

## 2019-01-18 ENCOUNTER — Encounter (HOSPITAL_COMMUNITY): Payer: Self-pay

## 2019-01-18 ENCOUNTER — Inpatient Hospital Stay (HOSPITAL_COMMUNITY): Payer: Medicare HMO

## 2019-01-18 ENCOUNTER — Other Ambulatory Visit: Payer: Self-pay

## 2019-01-18 VITALS — BP 118/62 | HR 71 | Temp 96.8°F | Resp 18 | Wt 184.0 lb

## 2019-01-18 DIAGNOSIS — D508 Other iron deficiency anemias: Secondary | ICD-10-CM | POA: Diagnosis not present

## 2019-01-18 MED ORDER — FAMOTIDINE 20 MG PO TABS
20.0000 mg | ORAL_TABLET | Freq: Once | ORAL | Status: AC
  Administered 2019-01-18: 08:00:00 20 mg via ORAL
  Filled 2019-01-18: qty 1

## 2019-01-18 MED ORDER — SODIUM CHLORIDE 0.9 % IV SOLN
Freq: Once | INTRAVENOUS | Status: AC
  Administered 2019-01-18: 08:00:00 via INTRAVENOUS

## 2019-01-18 MED ORDER — ACETAMINOPHEN 325 MG PO TABS
650.0000 mg | ORAL_TABLET | Freq: Once | ORAL | Status: AC
  Administered 2019-01-18: 08:00:00 650 mg via ORAL
  Filled 2019-01-18: qty 2

## 2019-01-18 MED ORDER — LORATADINE 10 MG PO TABS
10.0000 mg | ORAL_TABLET | Freq: Once | ORAL | Status: AC
  Administered 2019-01-18: 10 mg via ORAL
  Filled 2019-01-18: qty 1

## 2019-01-18 MED ORDER — SODIUM CHLORIDE 0.9 % IV SOLN
200.0000 mg | Freq: Once | INTRAVENOUS | Status: AC
  Administered 2019-01-18: 09:00:00 200 mg via INTRAVENOUS
  Filled 2019-01-18: qty 10

## 2019-01-18 NOTE — Progress Notes (Signed)
Pt presents today for Venofer infusion. MAR reviewed. Vital signs within normal limits. Pt has no complaints of any pain today. Pt denies any changes since the last visit.   Venofer given today per MD orders. Tolerated infusion without adverse affects. Vital signs stable. No complaints at this time. Discharged from clinic ambulatory. F/U with [facility] as scheduled.

## 2019-01-18 NOTE — Patient Instructions (Signed)
[facility]  Discharge Instructions:   _______________________________________________________________  Thank you for choosing [facility] to provide your oncology and hematology care.  To afford each patient quality time with our providers, please arrive at least 15 minutes before your scheduled appointment.  You need to re-schedule your appointment if you arrive 10 or more minutes late.  We strive to give you quality time with our providers, and arriving late affects you and other patients whose appointments are after yours.  Also, if you no show three or more times for appointments you may be dismissed from the clinic.  Again, thank you for choosing [facility]. Our hope is that these requests will allow you access to exceptional care and in a timely manner. _______________________________________________________________  If you have questions after your visit, please contact our office at [phone] between the hours of 8:30 a.m. and 5:00 p.m. Voicemails left after 4:30 p.m. will not be returned until the following business day. _______________________________________________________________  For prescription refill requests, have your pharmacy contact our office. _______________________________________________________________  Recommendations made by the consultant and any test results will be sent to your referring physician. _______________________________________________________________ 

## 2019-02-14 ENCOUNTER — Other Ambulatory Visit: Payer: Self-pay | Admitting: Family Medicine
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
## 2019-02-21 NOTE — Assessment & Plan Note (Signed)
1.  Iron deficiency anemia: - This is likely due from chronic inflammation and blood loss.  She also has irregular menses. - Patient last had Feraheme on 09/14/2015 where she had an anaphylactic to the [facility]. - If she needs IV iron in the future we will try Injectafer. -She takes Niferex 1 mg tablet daily with no side effects. -.  Labs today reveal a hemoglobin of 12.3, MCV 86.3 ferritin level is low normal at 24.  Due to the patient's level of fatigue.  Patient would likely benefit from IV iron.  Recommend IV Injectafer x 2 doses 1 week apart. -She will return to clinic in 4 months.  2.  Leukocytosis: - When she was first seen her white blood counts were 20 she was on antibiotics and prednisone at that time. -White blood cell count remains mildly elevated at 11.5, with absolute neutrophils elevated at 5.2, and eosinophils elevated at 0.7.  We will continue to monitor.  No interventions indicated at this time.  3.  Vitamin D deficiency: -Vitamin D level is low at 27.22.  Commend patient begin over-the-counter vitamin D supplementation.  We will continue to monitor.

## 2019-03-25 ENCOUNTER — Other Ambulatory Visit: Payer: Self-pay | Admitting: Family Medicine

## 2019-04-02 ENCOUNTER — Ambulatory Visit: Payer: Medicare HMO | Admitting: Nurse Practitioner

## 2019-04-04 ENCOUNTER — Encounter: Payer: Self-pay | Admitting: Nurse Practitioner

## 2019-04-04 ENCOUNTER — Ambulatory Visit (INDEPENDENT_AMBULATORY_CARE_PROVIDER_SITE_OTHER): Payer: Medicare HMO | Admitting: Nurse Practitioner

## 2019-04-04 ENCOUNTER — Ambulatory Visit: Payer: Medicare HMO | Admitting: Nurse Practitioner

## 2019-04-04 ENCOUNTER — Other Ambulatory Visit: Payer: Self-pay

## 2019-04-04 VITALS — BP 124/73 | HR 97 | Temp 96.2°F | Ht 67.5 in | Wt 181.2 lb

## 2019-04-04 DIAGNOSIS — K219 Gastro-esophageal reflux disease without esophagitis: Secondary | ICD-10-CM

## 2019-04-04 DIAGNOSIS — K5080 Crohn's disease of both small and large intestine without complications: Secondary | ICD-10-CM

## 2019-04-04 NOTE — Patient Instructions (Signed)
Your health issues we discussed today were:   GERD (reflux/heartburn): 1. Continue taking your current acid blocker Prilosec (omeprazole). 2. Call us if you have any worsening or severe symptoms  Crohn's disease: 1. Try to get your second set of paperwork submitted to patient assistance to try to get financial assistance in pain of your medication 2. Continue taking Humira once a week.  Is important that she take it every week, without missing a dose to prevent antibody development 3. Call us if you have any worsening symptoms such as abdominal pain, blood in your stools, loose stools, rashes, mouth sores 4. Have your labs drawn when you are able to 5. We will call you with results  Overall I recommend:  1. Continue with your other current medications 2. Return for follow-up in 6 months 3. Call us if you have any questions or concerns.   ---------------------------------------------------------------  COVID-19 Vaccine Information can be found at: [email or web address].uk For questions related to vaccine distribution or appointments, please email [email or web address] or call [phone].   ---------------------------------------------------------------   At [facility] we value your feedback. You may receive a survey about your visit today. Please share your experience as we strive to create trusting relationships with our patients to provide genuine, compassionate, quality care.  We appreciate your understanding and patience as we review any laboratory studies, imaging, and other diagnostic tests that are ordered as we care for you. Our office policy is 5 business days for review of these results, and any emergent or urgent results are addressed in a timely manner for your best interest. If you do not hear from our office in 1 week, please contact us.   We also encourage the use of MyChart, which contains your  medical information for your review as well. If you are not enrolled in this feature, an access code is on this after visit summary for your convenience. Thank you for allowing [place] to be involved in your care.  It was great to see you today!  I hope you have a great day!!
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
## 2019-04-04 NOTE — Progress Notes (Signed)
Cc'ed to pcp °

## 2019-04-04 NOTE — Assessment & Plan Note (Signed)
Currently well managed on twice daily PPI.  Recommend she continue her current medications and call us for any worsening symptoms.

## 2019-04-04 NOTE — Assessment & Plan Note (Signed)
Currently doing well.  She is on Humira once weekly.  Denies any abdominal pain, loose stools, hematochezia, rashes, sores.  Overall her symptoms appear to be very well controlled.  Her last drug and antibody levels were adequate medication level and no antibodies.  She has a second set of forms she needs to submit to patient assistance to see if she can get help with pain for medication now that her husband is lost a job.  I have encouraged her to do so.  Continue current medications, update labs including CBC and CMP.  Follow-up in 6 months.  Call for any worsening or new symptoms.

## 2019-04-25 ENCOUNTER — Other Ambulatory Visit: Payer: Self-pay | Admitting: Family Medicine

## 2019-04-25 DIAGNOSIS — D509 Iron deficiency anemia, unspecified: Secondary | ICD-10-CM

## 2019-05-09 ENCOUNTER — Other Ambulatory Visit (HOSPITAL_COMMUNITY): Payer: Self-pay | Admitting: *Deleted

## 2019-05-09 DIAGNOSIS — D508 Other iron deficiency anemias: Secondary | ICD-10-CM

## 2019-05-10 ENCOUNTER — Inpatient Hospital Stay (HOSPITAL_COMMUNITY): Payer: Medicare HMO

## 2019-05-14 ENCOUNTER — Other Ambulatory Visit: Payer: Self-pay

## 2019-05-14 ENCOUNTER — Inpatient Hospital Stay (HOSPITAL_COMMUNITY): Payer: Medicare HMO | Attending: Hematology

## 2019-05-14 DIAGNOSIS — D509 Iron deficiency anemia, unspecified: Secondary | ICD-10-CM | POA: Diagnosis present

## 2019-05-14 DIAGNOSIS — E559 Vitamin D deficiency, unspecified: Secondary | ICD-10-CM | POA: Insufficient documentation

## 2019-05-14 DIAGNOSIS — D508 Other iron deficiency anemias: Secondary | ICD-10-CM

## 2019-05-14 DIAGNOSIS — D72829 Elevated white blood cell count, unspecified: Secondary | ICD-10-CM | POA: Diagnosis not present

## 2019-05-14 DIAGNOSIS — Z79899 Other long term (current) drug therapy: Secondary | ICD-10-CM | POA: Diagnosis not present

## 2019-05-14 DIAGNOSIS — Z87891 Personal history of nicotine dependence: Secondary | ICD-10-CM | POA: Insufficient documentation

## 2019-05-14 DIAGNOSIS — N926 Irregular menstruation, unspecified: Secondary | ICD-10-CM | POA: Diagnosis not present

## 2019-05-14 LAB — CBC WITH DIFFERENTIAL/PLATELET
Abs Immature Granulocytes: 0.02 10*3/uL (ref 0.00–0.07)
Basophils Absolute: 0.1 10*3/uL (ref 0.0–0.1)
Basophils Relative: 1 %
Eosinophils Absolute: 0.5 10*3/uL (ref 0.0–0.5)
Eosinophils Relative: 5 %
HCT: 36 % (ref 36.0–46.0)
Hemoglobin: 11.4 g/dL — ABNORMAL LOW (ref 12.0–15.0)
Immature Granulocytes: 0 %
Lymphocytes Relative: 39 %
Lymphs Abs: 4.2 10*3/uL — ABNORMAL HIGH (ref 0.7–4.0)
MCH: 27.8 pg (ref 26.0–34.0)
MCHC: 31.7 g/dL (ref 30.0–36.0)
MCV: 87.8 fL (ref 80.0–100.0)
Monocytes Absolute: 0.8 10*3/uL (ref 0.1–1.0)
Monocytes Relative: 7 %
Neutro Abs: 5.2 10*3/uL (ref 1.7–7.7)
Neutrophils Relative %: 48 %
Platelets: 348 10*3/uL (ref 150–400)
RBC: 4.1 MIL/uL (ref 3.87–5.11)
RDW: 15.4 % (ref 11.5–15.5)
WBC: 10.8 10*3/uL — ABNORMAL HIGH (ref 4.0–10.5)
nRBC: 0 % (ref 0.0–0.2)

## 2019-05-14 LAB — COMPREHENSIVE METABOLIC PANEL
ALT: 15 U/L (ref 0–44)
AST: 14 U/L — ABNORMAL LOW (ref 15–41)
Albumin: 3 g/dL — ABNORMAL LOW (ref 3.5–5.0)
Alkaline Phosphatase: 59 U/L (ref 38–126)
Anion gap: 5 (ref 5–15)
BUN: 12 mg/dL (ref 6–20)
CO2: 25 mmol/L (ref 22–32)
Calcium: 8.1 mg/dL — ABNORMAL LOW (ref 8.9–10.3)
Chloride: 104 mmol/L (ref 98–111)
Creatinine, Ser: 0.74 mg/dL (ref 0.44–1.00)
GFR calc Af Amer: >60 mL/min (ref >60)
GFR calc non Af Amer: >60 mL/min (ref >60)
Glucose, Bld: 89 mg/dL (ref 70–99)
Potassium: 4 mmol/L (ref 3.5–5.1)
Sodium: 134 mmol/L — ABNORMAL LOW (ref 135–145)
Total Bilirubin: 0.3 mg/dL (ref 0.3–1.2)
Total Protein: 7.1 g/dL (ref 6.5–8.1)

## 2019-05-14 LAB — LACTATE DEHYDROGENASE: LDH: 108 U/L (ref 98–192)

## 2019-05-14 LAB — IRON AND TIBC
Iron: 79 ug/dL (ref 28–170)
Saturation Ratios: 24 % (ref 10.4–31.8)
TIBC: 325 ug/dL (ref 250–450)
UIBC: 246 ug/dL

## 2019-05-14 LAB — FOLATE: Folate: 18.2 ng/mL (ref >5.9)

## 2019-05-14 LAB — FERRITIN: Ferritin: 69 ng/mL (ref 11–307)

## 2019-05-14 LAB — VITAMIN D 25 HYDROXY (VIT D DEFICIENCY, FRACTURES): Vit D, 25-Hydroxy: 28.46 ng/mL — ABNORMAL LOW (ref 30–100)

## 2019-05-14 LAB — VITAMIN B12: Vitamin B-12: 652 pg/mL (ref 180–914)

## 2019-05-15 ENCOUNTER — Inpatient Hospital Stay (HOSPITAL_BASED_OUTPATIENT_CLINIC_OR_DEPARTMENT_OTHER): Payer: Medicare HMO | Admitting: Nurse Practitioner

## 2019-05-15 ENCOUNTER — Other Ambulatory Visit: Payer: Self-pay

## 2019-05-15 DIAGNOSIS — D72829 Elevated white blood cell count, unspecified: Secondary | ICD-10-CM

## 2019-05-15 DIAGNOSIS — E559 Vitamin D deficiency, unspecified: Secondary | ICD-10-CM

## 2019-05-15 DIAGNOSIS — D508 Other iron deficiency anemias: Secondary | ICD-10-CM

## 2019-05-15 DIAGNOSIS — Z79899 Other long term (current) drug therapy: Secondary | ICD-10-CM

## 2019-05-15 DIAGNOSIS — Z87891 Personal history of nicotine dependence: Secondary | ICD-10-CM

## 2019-05-15 MED ORDER — ERGOCALCIFEROL 1.25 MG (50000 UT) PO CAPS: 50000.0000 [IU] | ORAL_CAPSULE | ORAL | 6 refills | Status: DC

## 2019-05-15 NOTE — Patient Instructions (Signed)
[facility] at [facility] Discharge Instructions     Thank you for choosing [facility] at [facility] to provide your oncology and hematology care.  To afford each patient quality time with our provider, please arrive at least 15 minutes before your scheduled appointment time.   If you have a lab appointment with the Cancer Center please come in thru the Main Entrance and check in at the main information desk.  You need to re-schedule your appointment should you arrive 10 or more minutes late.  We strive to give you quality time with our providers, and arriving late affects you and other patients whose appointments are after yours.  Also, if you no show three or more times for appointments you may be dismissed from the clinic at the providers discretion.     Again, thank you for choosing [facility].  Our hope is that these requests will decrease the amount of time that you wait before being seen by our physicians.       _____________________________________________________________  Should you have questions after your visit to [facility], please contact our office at [phone] between the hours of 8:00 a.m. and 4:30 p.m.  Voicemails left after 4:00 p.m. will not be returned until the following business day.  For prescription refill requests, have your pharmacy contact our office and allow 72 hours.    Due to Covid, you will need to wear a mask upon entering the hospital. If you do not have a mask, a mask will be given to you at the Main Entrance upon arrival. For doctor visits, patients may have 1 support person with them. For treatment visits, patients can not have anyone with them due to social distancing guidelines and our immunocompromised population.      
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
## 2019-05-15 NOTE — Assessment & Plan Note (Addendum)
1.  Iron deficiency anemia: - This is likely due from chronic inflammation and blood loss.  She also has irregular menses. - Patient last had Feraheme on 09/14/2015 where she had an anaphylactic to the [facility]. - If she needs IV iron in the future we will try Injectafer. -She takes Niferex 1 mg tablet daily with no side effects. -Last Injectafer infusion was on 01/11/2019 and 01/18/2019.  She tolerated this better. - She will continue taking the iron tablets daily she is maintaining her hemoglobin at this time. -Labs done on 05/14/2019 showed hemoglobin 11.4, ferritin 69, percent saturation 24 -Patient reports she is taking oral iron tablets and would like to maintain on them for as long as she can. -We will follow-up in 3 months with repeat labs . 2.  Leukocytosis: - When she was first seen her white blood counts were 20 she was on antibiotics and prednisone at that time. -We repeated her WBCs with her labs on 06/28/2018 her WBC was 10.2.  She was on antibiotics for a sinus infection at this time. -Labs done on 05/14/2019 showed her WBC 10.8 -We will repeat these labs in 3 months.  3.  Vitamin D deficiency: -Vitamin D level initially was 27.22. -She began over-the-counter vitamin D supplements. -Labs done on 05/14/2019 showed her vitamin D level is 28.46. -We will call her in vitamin D 50,000 units weekly.

## 2019-05-17 ENCOUNTER — Ambulatory Visit (HOSPITAL_COMMUNITY): Payer: Medicare HMO | Admitting: Nurse Practitioner

## 2019-05-23 ENCOUNTER — Other Ambulatory Visit: Payer: Self-pay | Admitting: Family Medicine

## 2019-06-12 ENCOUNTER — Ambulatory Visit: Payer: Medicare HMO | Admitting: Family Medicine

## 2019-06-27 ENCOUNTER — Other Ambulatory Visit: Payer: Self-pay | Admitting: Family Medicine

## 2019-07-23 ENCOUNTER — Ambulatory Visit: Payer: BLUE CROSS/BLUE SHIELD | Admitting: Family Medicine

## 2019-08-12 ENCOUNTER — Other Ambulatory Visit: Payer: Self-pay

## 2019-08-12 ENCOUNTER — Telehealth (INDEPENDENT_AMBULATORY_CARE_PROVIDER_SITE_OTHER): Payer: Medicare HMO

## 2019-08-12 VITALS — BP 124/73 | Ht 67.0 in | Wt 181.0 lb

## 2019-08-12 DIAGNOSIS — Z1231 Encounter for screening mammogram for malignant neoplasm of breast: Secondary | ICD-10-CM

## 2019-08-12 DIAGNOSIS — Z0000 Encounter for general adult medical examination without abnormal findings: Secondary | ICD-10-CM

## 2019-08-12 DIAGNOSIS — Z1159 Encounter for screening for other viral diseases: Secondary | ICD-10-CM

## 2019-08-12 NOTE — Patient Instructions (Signed)
[name] , Thank you for taking time to come for your Medicare Wellness Visit. I appreciate your ongoing commitment to your health goals. Please review the following plan we discussed and let me know if I can assist you in the future.   Screening recommendations/referrals: Colonoscopy: up to date Mammogram: ordered and scheduled Bone Density: N/A Recommended yearly ophthalmology/optometry visit for glaucoma screening and checkup Recommended yearly dental visit for hygiene and checkup  Vaccinations: Influenza vaccine: up to date Pneumococcal vaccine:  Tdap vaccine: due- due to insurance has to get at pharmacy Shingles vaccine: qualifies- can get at the pharmacy if wanted      Next appointment: wellness in 1 year  Preventive Care 40-64 Years, Female Preventive care refers to lifestyle choices and visits with your health care provider that can promote health and wellness. What does preventive care include?  A yearly physical exam. This is also called an annual well check.  Dental exams once or twice a year.  Routine eye exams. Ask your health care provider how often you should have your eyes checked.  Personal lifestyle choices, including:  Daily care of your teeth and gums.  Regular physical activity.  Eating a healthy diet.  Avoiding tobacco and drug use.  Limiting alcohol use.  Practicing safe sex.  Taking low-dose aspirin daily starting at age 63.  Taking vitamin and mineral supplements as recommended by your health care provider. What happens during an annual well check? The services and screenings done by your health care provider during your annual well check will depend on your age, overall health, lifestyle risk factors, and family history of disease. Counseling  Your health care provider may ask you questions about your:  Alcohol use.  Tobacco use.  Drug use.  Emotional well-being.  Home and relationship well-being.  Sexual activity.  Eating  habits.  Work and work Statistician.  Method of birth control.  Menstrual cycle.  Pregnancy history. Screening  You may have the following tests or measurements:  Height, weight, and BMI.  Blood pressure.  Lipid and cholesterol levels. These may be checked every 5 years, or more frequently if you are over 66 years old.  Skin check.  Lung cancer screening. You may have this screening every year starting at age 89 if you have a 30-pack-year history of smoking and currently smoke or have quit within the past 15 years.  Fecal occult blood test (FOBT) of the stool. You may have this test every year starting at age 57.  Flexible sigmoidoscopy or colonoscopy. You may have a sigmoidoscopy every 5 years or a colonoscopy every 10 years starting at age 73.  Hepatitis C blood test.  Hepatitis B blood test.  Sexually transmitted disease (STD) testing.  Diabetes screening. This is done by checking your blood sugar (glucose) after you have not eaten for a while (fasting). You may have this done every 1-3 years.  Mammogram. This may be done every 1-2 years. Talk to your health care provider about when you should start having regular mammograms. This may depend on whether you have a family history of breast cancer.  BRCA-related cancer screening. This may be done if you have a family history of breast, ovarian, tubal, or peritoneal cancers.  Pelvic exam and Pap test. This may be done every 3 years starting at age 99. Starting at age 94, this may be done every 5 years if you have a Pap test in combination with an HPV test.  Bone density scan. This is  done to screen for osteoporosis. You may have this scan if you are at high risk for osteoporosis. Discuss your test results, treatment options, and if necessary, the need for more tests with your health care provider. Vaccines  Your health care provider may recommend certain vaccines, such as:  Influenza vaccine. This is recommended every  year.  Tetanus, diphtheria, and acellular pertussis (Tdap, Td) vaccine. You may need a Td booster every 10 years.  Zoster vaccine. You may need this after age 45.  Pneumococcal 13-valent conjugate (PCV13) vaccine. You may need this if you have certain conditions and were not previously vaccinated.  Pneumococcal polysaccharide (PPSV23) vaccine. You may need one or two doses if you smoke cigarettes or if you have certain conditions. Talk to your health care provider about which screenings and vaccines you need and how often you need them. This information is not intended to replace advice given to you by your health care provider. Make sure you discuss any questions you have with your health care provider. Document Released: 03/20/2015 Document Revised: 11/11/2015 Document Reviewed: 12/23/2014 Elsevier Interactive Patient Education  2017 [employer] Prevention in the Home Falls can cause injuries. They can happen to people of all ages. There are many things you can do to make your home safe and to help prevent falls. What can I do on the outside of my home?  Regularly fix the edges of walkways and driveways and fix any cracks.  Remove anything that might make you trip as you walk through a door, such as a raised step or threshold.  Trim any bushes or trees on the path to your home.  Use bright outdoor lighting.  Clear any walking paths of anything that might make someone trip, such as rocks or tools.  Regularly check to see if handrails are loose or broken. Make sure that both sides of any steps have handrails.  Any raised decks and porches should have guardrails on the edges.  Have any leaves, snow, or ice cleared regularly.  Use sand or salt on walking paths during winter.  Clean up any spills in your garage right away. This includes oil or grease spills. What can I do in the bathroom?  Use night lights.  Install grab bars by the toilet and in the tub and shower.  Do not use towel bars as grab bars.  Use non-skid mats or decals in the tub or shower.  If you need to sit down in the shower, use a plastic, non-slip stool.  Keep the floor dry. Clean up any water that spills on the floor as soon as it happens.  Remove soap buildup in the tub or shower regularly.  Attach bath mats securely with double-sided non-slip rug tape.  Do not have throw rugs and other things on the floor that can make you trip. What can I do in the bedroom?  Use night lights.  Make sure that you have a light by your bed that is easy to reach.  Do not use any sheets or blankets that are too big for your bed. They should not hang down onto the floor.  Have a firm chair that has side arms. You can use this for support while you get dressed.  Do not have throw rugs and other things on the floor that can make you trip. What can I do in the kitchen?  Clean up any spills right away.  Avoid walking on wet floors.  Keep items  that you use a lot in easy-to-reach places.  If you need to reach something above you, use a strong step stool that has a grab bar.  Keep electrical cords out of the way.  Do not use floor polish or wax that makes floors slippery. If you must use wax, use non-skid floor wax.  Do not have throw rugs and other things on the floor that can make you trip. What can I do with my stairs?  Do not leave any items on the stairs.  Make sure that there are handrails on both sides of the stairs and use them. Fix handrails that are broken or loose. Make sure that handrails are as long as the stairways.  Check any carpeting to make sure that it is firmly attached to the stairs. Fix any carpet that is loose or worn.  Avoid having throw rugs at the top or bottom of the stairs. If you do have throw rugs, attach them to the floor with carpet tape.  Make sure that you have a light switch at the top of the stairs and the bottom of the stairs. If you do not have them,  ask someone to add them for you. What else can I do to help prevent falls?  Wear shoes that:  Do not have high heels.  Have rubber bottoms.  Are comfortable and fit you well.  Are closed at the toe. Do not wear sandals.  If you use a stepladder:  Make sure that it is fully opened. Do not climb a closed stepladder.  Make sure that both sides of the stepladder are locked into place.  Ask someone to hold it for you, if possible.  Clearly mark and make sure that you can see:  Any grab bars or handrails.  First and last steps.  Where the edge of each step is.  Use tools that help you move around (mobility aids) if they are needed. These include:  Canes.  Walkers.  Scooters.  Crutches.  Turn on the lights when you go into a dark area. Replace any light bulbs as soon as they burn out.  Set up your furniture so you have a clear path. Avoid moving your furniture around.  If any of your floors are uneven, fix them.  If there are any pets around you, be aware of where they are.  Review your medicines with your doctor. Some medicines can make you feel dizzy. This can increase your chance of falling. Ask your doctor what other things that you can do to help prevent falls. This information is not intended to replace advice given to you by your health care provider. Make sure you discuss any questions you have with your health care provider. Document Released: 12/18/2008 Document Revised: 07/30/2015 Document Reviewed: 03/28/2014 Elsevier Interactive Patient Education  2017 [employer].
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
## 2019-08-15 ENCOUNTER — Inpatient Hospital Stay (HOSPITAL_COMMUNITY): Payer: Medicare HMO | Attending: Hematology

## 2019-08-15 ENCOUNTER — Other Ambulatory Visit: Payer: Self-pay

## 2019-08-15 DIAGNOSIS — E559 Vitamin D deficiency, unspecified: Secondary | ICD-10-CM | POA: Insufficient documentation

## 2019-08-15 DIAGNOSIS — D72829 Elevated white blood cell count, unspecified: Secondary | ICD-10-CM | POA: Insufficient documentation

## 2019-08-15 DIAGNOSIS — Z87891 Personal history of nicotine dependence: Secondary | ICD-10-CM | POA: Insufficient documentation

## 2019-08-15 DIAGNOSIS — D508 Other iron deficiency anemias: Secondary | ICD-10-CM

## 2019-08-15 DIAGNOSIS — Z79899 Other long term (current) drug therapy: Secondary | ICD-10-CM | POA: Insufficient documentation

## 2019-08-15 DIAGNOSIS — N926 Irregular menstruation, unspecified: Secondary | ICD-10-CM | POA: Insufficient documentation

## 2019-08-15 DIAGNOSIS — D509 Iron deficiency anemia, unspecified: Secondary | ICD-10-CM | POA: Diagnosis present

## 2019-08-15 LAB — CBC WITH DIFFERENTIAL/PLATELET
Abs Immature Granulocytes: 0.04 10*3/uL (ref 0.00–0.07)
Basophils Absolute: 0.1 10*3/uL (ref 0.0–0.1)
Basophils Relative: 1 %
Eosinophils Absolute: 0.8 10*3/uL — ABNORMAL HIGH (ref 0.0–0.5)
Eosinophils Relative: 6 %
HCT: 39.6 % (ref 36.0–46.0)
Hemoglobin: 12.5 g/dL (ref 12.0–15.0)
Immature Granulocytes: 0 %
Lymphocytes Relative: 32 %
Lymphs Abs: 4.7 10*3/uL — ABNORMAL HIGH (ref 0.7–4.0)
MCH: 27.2 pg (ref 26.0–34.0)
MCHC: 31.6 g/dL (ref 30.0–36.0)
MCV: 86.1 fL (ref 80.0–100.0)
Monocytes Absolute: 0.8 10*3/uL (ref 0.1–1.0)
Monocytes Relative: 6 %
Neutro Abs: 8.2 10*3/uL — ABNORMAL HIGH (ref 1.7–7.7)
Neutrophils Relative %: 55 %
Platelets: 450 10*3/uL — ABNORMAL HIGH (ref 150–400)
RBC: 4.6 MIL/uL (ref 3.87–5.11)
RDW: 15 % (ref 11.5–15.5)
WBC: 14.7 10*3/uL — ABNORMAL HIGH (ref 4.0–10.5)
nRBC: 0 % (ref 0.0–0.2)

## 2019-08-15 LAB — COMPREHENSIVE METABOLIC PANEL
ALT: 17 U/L (ref 0–44)
AST: 17 U/L (ref 15–41)
Albumin: 3.1 g/dL — ABNORMAL LOW (ref 3.5–5.0)
Alkaline Phosphatase: 65 U/L (ref 38–126)
Anion gap: 7 (ref 5–15)
BUN: 13 mg/dL (ref 6–20)
CO2: 25 mmol/L (ref 22–32)
Calcium: 8.5 mg/dL — ABNORMAL LOW (ref 8.9–10.3)
Chloride: 103 mmol/L (ref 98–111)
Creatinine, Ser: 0.75 mg/dL (ref 0.44–1.00)
GFR calc Af Amer: >60 mL/min (ref >60)
GFR calc non Af Amer: >60 mL/min (ref >60)
Glucose, Bld: 97 mg/dL (ref 70–99)
Potassium: 3.7 mmol/L (ref 3.5–5.1)
Sodium: 135 mmol/L (ref 135–145)
Total Bilirubin: 0.2 mg/dL — ABNORMAL LOW (ref 0.3–1.2)
Total Protein: 7.3 g/dL (ref 6.5–8.1)

## 2019-08-15 LAB — IRON AND TIBC
Iron: 67 ug/dL (ref 28–170)
Saturation Ratios: 19 % (ref 10.4–31.8)
TIBC: 359 ug/dL (ref 250–450)
UIBC: 292 ug/dL

## 2019-08-15 LAB — FOLATE: Folate: 14 ng/mL (ref >5.9)

## 2019-08-15 LAB — VITAMIN B12: Vitamin B-12: 613 pg/mL (ref 180–914)

## 2019-08-15 LAB — LACTATE DEHYDROGENASE: LDH: 111 U/L (ref 98–192)

## 2019-08-15 LAB — VITAMIN D 25 HYDROXY (VIT D DEFICIENCY, FRACTURES): Vit D, 25-Hydroxy: 38.87 ng/mL (ref 30–100)

## 2019-08-15 LAB — FERRITIN: Ferritin: 46 ng/mL (ref 11–307)

## 2019-08-21 ENCOUNTER — Inpatient Hospital Stay (HOSPITAL_BASED_OUTPATIENT_CLINIC_OR_DEPARTMENT_OTHER): Payer: Medicare HMO | Admitting: Nurse Practitioner

## 2019-08-21 ENCOUNTER — Other Ambulatory Visit: Payer: Self-pay

## 2019-08-21 ENCOUNTER — Other Ambulatory Visit (HOSPITAL_COMMUNITY): Payer: Self-pay | Admitting: Nurse Practitioner

## 2019-08-21 DIAGNOSIS — D508 Other iron deficiency anemias: Secondary | ICD-10-CM | POA: Diagnosis not present

## 2019-08-21 NOTE — Assessment & Plan Note (Signed)
1.  Iron deficiency anemia: - This is likely due from chronic inflammation and blood loss.  She also has irregular menses. - Patient last had Feraheme on 09/14/2015 where she had an anaphylactic to the [facility]. - If she needs IV iron in the future we will try Injectafer. -She takes Niferex 1 mg tablet daily with no side effects. -Last Injectafer infusion was on 01/11/2019 and 01/18/2019.  She tolerated this better. - She will continue taking the iron tablets daily she is maintaining her hemoglobin at this time. -Labs done on 08/15/2019 showed hemoglobin 12.5, ferritin 46, percent saturation 19 -Patient reports she is still taking oral iron tablets.  However she is feeling very fatigued and is getting worse lately. -We will try to infusions of the IV Venofer to help with her fatigue. -We will follow-up in 3 months with repeat labs . 2.  Leukocytosis: - When she was first seen her white blood counts were 20 she was on antibiotics and prednisone at that time. -We repeated her WBCs with her labs on 06/28/2018 her WBC was 10.2.  She was on antibiotics for a sinus infection at this time. -Labs done on 08/15/2019 showed WBC 12.5 -We will repeat these labs in 3 months.  3.  Vitamin D deficiency: -Vitamin D level initially was 27.22. -She began over-the-counter vitamin D supplements. -Labs done on 05/14/2019 showed her vitamin D level is 28.46. -We will call her in vitamin D 50,000 units weekly. -Labs done on 08/15/2019 showed her vitamin D level 38.87 -She will continue the weekly vitamin D.
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
## 2019-08-28 ENCOUNTER — Inpatient Hospital Stay (HOSPITAL_COMMUNITY): Payer: Medicare HMO

## 2019-08-28 VITALS — BP 105/67 | HR 82 | Temp 98.5°F | Resp 16

## 2019-08-28 DIAGNOSIS — E559 Vitamin D deficiency, unspecified: Secondary | ICD-10-CM | POA: Diagnosis not present

## 2019-08-28 DIAGNOSIS — Z79899 Other long term (current) drug therapy: Secondary | ICD-10-CM | POA: Diagnosis not present

## 2019-08-28 DIAGNOSIS — N926 Irregular menstruation, unspecified: Secondary | ICD-10-CM | POA: Diagnosis not present

## 2019-08-28 DIAGNOSIS — D509 Iron deficiency anemia, unspecified: Secondary | ICD-10-CM | POA: Diagnosis not present

## 2019-08-28 DIAGNOSIS — D72829 Elevated white blood cell count, unspecified: Secondary | ICD-10-CM | POA: Diagnosis not present

## 2019-08-28 DIAGNOSIS — D508 Other iron deficiency anemias: Secondary | ICD-10-CM

## 2019-08-28 DIAGNOSIS — Z87891 Personal history of nicotine dependence: Secondary | ICD-10-CM | POA: Diagnosis not present

## 2019-08-28 MED ORDER — SODIUM CHLORIDE 0.9 % IV SOLN
200.0000 mg | Freq: Once | INTRAVENOUS | Status: AC
  Administered 2019-08-28: 200 mg via INTRAVENOUS
  Filled 2019-08-28: qty 200

## 2019-08-28 MED ORDER — SODIUM CHLORIDE 0.9 % IV SOLN: Freq: Once | INTRAVENOUS | Status: AC

## 2019-08-28 MED ORDER — DIPHENHYDRAMINE HCL 50 MG/ML IJ SOLN
12.5000 mg | Freq: Once | INTRAMUSCULAR | Status: AC
  Administered 2019-08-28: 12.5 mg via INTRAVENOUS
  Filled 2019-08-28: qty 1

## 2019-08-28 MED ORDER — DIPHENHYDRAMINE HCL 50 MG/ML IJ SOLN
INTRAMUSCULAR | Status: AC
  Filled 2019-08-28: qty 1

## 2019-08-28 MED ORDER — METHYLPREDNISOLONE SODIUM SUCC 125 MG IJ SOLR
125.0000 mg | Freq: Once | INTRAMUSCULAR | Status: AC
  Administered 2019-08-28: 125 mg via INTRAVENOUS
  Filled 2019-08-28: qty 2

## 2019-08-28 NOTE — Progress Notes (Signed)
Iron infusion given per orders. Patient tolerated it well without problems. Vitals stable and discharged home from clinic ambulatory. Follow up as scheduled.  

## 2019-08-28 NOTE — Patient Instructions (Signed)
[facility] at [facility]  Discharge Instructions:   _______________________________________________________________  Thank you for choosing [facility] at [facility] to provide your oncology and hematology care.  To afford each patient quality time with our providers, please arrive at least 15 minutes before your scheduled appointment.  You need to re-schedule your appointment if you arrive 10 or more minutes late.  We strive to give you quality time with our providers, and arriving late affects you and other patients whose appointments are after yours.  Also, if you no show three or more times for appointments you may be dismissed from the clinic.  Again, thank you for choosing [facility] at [facility]. Our hope is that these requests will allow you access to exceptional care and in a timely manner. _______________________________________________________________  If you have questions after your visit, please contact our office at [phone] between the hours of 8:30 a.m. and 5:00 p.m. Voicemails left after 4:30 p.m. will not be returned until the following business day. _______________________________________________________________  For prescription refill requests, have your pharmacy contact our office. _______________________________________________________________  Recommendations made by the consultant and any test results will be sent to your referring physician. _______________________________________________________________ 

## 2019-08-30 ENCOUNTER — Inpatient Hospital Stay (HOSPITAL_COMMUNITY): Payer: Medicare HMO

## 2019-08-30 ENCOUNTER — Other Ambulatory Visit: Payer: Self-pay

## 2019-08-30 ENCOUNTER — Encounter (HOSPITAL_COMMUNITY): Payer: Self-pay

## 2019-08-30 VITALS — BP 113/70 | HR 70 | Temp 97.8°F | Resp 18

## 2019-08-30 DIAGNOSIS — D508 Other iron deficiency anemias: Secondary | ICD-10-CM

## 2019-08-30 DIAGNOSIS — D72829 Elevated white blood cell count, unspecified: Secondary | ICD-10-CM | POA: Diagnosis not present

## 2019-08-30 DIAGNOSIS — Z87891 Personal history of nicotine dependence: Secondary | ICD-10-CM | POA: Diagnosis not present

## 2019-08-30 DIAGNOSIS — Z79899 Other long term (current) drug therapy: Secondary | ICD-10-CM | POA: Diagnosis not present

## 2019-08-30 DIAGNOSIS — N926 Irregular menstruation, unspecified: Secondary | ICD-10-CM | POA: Diagnosis not present

## 2019-08-30 DIAGNOSIS — E559 Vitamin D deficiency, unspecified: Secondary | ICD-10-CM | POA: Diagnosis not present

## 2019-08-30 DIAGNOSIS — D509 Iron deficiency anemia, unspecified: Secondary | ICD-10-CM | POA: Diagnosis not present

## 2019-08-30 MED ORDER — SODIUM CHLORIDE 0.9 % IV SOLN
200.0000 mg | Freq: Once | INTRAVENOUS | Status: AC
  Administered 2019-08-30: 200 mg via INTRAVENOUS
  Filled 2019-08-30: qty 200

## 2019-08-30 MED ORDER — METHYLPREDNISOLONE SODIUM SUCC 125 MG IJ SOLR
125.0000 mg | Freq: Once | INTRAMUSCULAR | Status: AC
  Administered 2019-08-30: 125 mg via INTRAVENOUS
  Filled 2019-08-30: qty 2

## 2019-08-30 MED ORDER — SODIUM CHLORIDE 0.9 % IV SOLN: Freq: Once | INTRAVENOUS | Status: AC

## 2019-08-30 MED ORDER — DIPHENHYDRAMINE HCL 50 MG/ML IJ SOLN
12.5000 mg | Freq: Once | INTRAMUSCULAR | Status: AC
  Administered 2019-08-30: 12.5 mg via INTRAVENOUS
  Filled 2019-08-30: qty 1

## 2019-08-30 NOTE — Progress Notes (Signed)
Patient presents today for Venofer infusion. Vital signs are stable. Patient has no complaints of pain or changes since her last visit. MAR reviewed and updated.   Treatment given today per MD orders. Tolerated infusion without adverse affects. Vital signs stable. No complaints at this time. Discharged from clinic ambulatory. F/U with [facility] as scheduled.

## 2019-08-30 NOTE — Patient Instructions (Signed)
[facility] at [facility]  Discharge Instructions:   _______________________________________________________________  Thank you for choosing [facility] at [facility] to provide your oncology and hematology care.  To afford each patient quality time with our providers, please arrive at least 15 minutes before your scheduled appointment.  You need to re-schedule your appointment if you arrive 10 or more minutes late.  We strive to give you quality time with our providers, and arriving late affects you and other patients whose appointments are after yours.  Also, if you no show three or more times for appointments you may be dismissed from the clinic.  Again, thank you for choosing [name] at [facility]. Our hope is that these requests will allow you access to exceptional care and in a timely manner. _______________________________________________________________  If you have questions after your visit, please contact our office at [phone] between the hours of 8:30 a.m. and 5:00 p.m. Voicemails left after 4:30 p.m. will not be returned until the following business day. _______________________________________________________________  For prescription refill requests, have your pharmacy contact our office. _______________________________________________________________  Recommendations made by the consultant and any test results will be sent to your referring physician. _______________________________________________________________ 

## 2019-09-04 ENCOUNTER — Inpatient Hospital Stay (HOSPITAL_COMMUNITY): Payer: Medicare HMO

## 2019-09-04 ENCOUNTER — Encounter (HOSPITAL_COMMUNITY): Payer: Self-pay

## 2019-09-04 VITALS — BP 105/70 | HR 77 | Temp 97.5°F | Resp 18

## 2019-09-04 DIAGNOSIS — D72829 Elevated white blood cell count, unspecified: Secondary | ICD-10-CM | POA: Diagnosis not present

## 2019-09-04 DIAGNOSIS — Z87891 Personal history of nicotine dependence: Secondary | ICD-10-CM | POA: Diagnosis not present

## 2019-09-04 DIAGNOSIS — N926 Irregular menstruation, unspecified: Secondary | ICD-10-CM | POA: Diagnosis not present

## 2019-09-04 DIAGNOSIS — D508 Other iron deficiency anemias: Secondary | ICD-10-CM

## 2019-09-04 DIAGNOSIS — D509 Iron deficiency anemia, unspecified: Secondary | ICD-10-CM | POA: Diagnosis not present

## 2019-09-04 DIAGNOSIS — Z79899 Other long term (current) drug therapy: Secondary | ICD-10-CM | POA: Diagnosis not present

## 2019-09-04 DIAGNOSIS — E559 Vitamin D deficiency, unspecified: Secondary | ICD-10-CM | POA: Diagnosis not present

## 2019-09-04 MED ORDER — SODIUM CHLORIDE 0.9 % IV SOLN: Freq: Once | INTRAVENOUS | Status: AC

## 2019-09-04 MED ORDER — METHYLPREDNISOLONE SODIUM SUCC 125 MG IJ SOLR
125.0000 mg | Freq: Once | INTRAMUSCULAR | Status: AC
  Administered 2019-09-04: 125 mg via INTRAVENOUS
  Filled 2019-09-04: qty 2

## 2019-09-04 MED ORDER — DIPHENHYDRAMINE HCL 50 MG/ML IJ SOLN
12.5000 mg | Freq: Once | INTRAMUSCULAR | Status: AC
  Administered 2019-09-04: 12.5 mg via INTRAVENOUS
  Filled 2019-09-04: qty 1

## 2019-09-04 MED ORDER — SODIUM CHLORIDE 0.9 % IV SOLN
200.0000 mg | Freq: Once | INTRAVENOUS | Status: AC
  Administered 2019-09-04: 200 mg via INTRAVENOUS
  Filled 2019-09-04: qty 200

## 2019-09-04 NOTE — Progress Notes (Signed)
Iron infusion given per orders. Patient tolerated it well without problems. Vitals stable and discharged home from clinic ambulatory. Follow up as scheduled.  

## 2019-09-06 ENCOUNTER — Inpatient Hospital Stay (HOSPITAL_COMMUNITY): Payer: Medicare HMO | Attending: Nurse Practitioner

## 2019-09-06 ENCOUNTER — Encounter (HOSPITAL_COMMUNITY): Payer: Self-pay

## 2019-09-06 VITALS — BP 106/60 | HR 73 | Temp 97.5°F | Resp 18

## 2019-09-06 DIAGNOSIS — Z836 Family history of other diseases of the respiratory system: Secondary | ICD-10-CM | POA: Diagnosis not present

## 2019-09-06 DIAGNOSIS — Z79899 Other long term (current) drug therapy: Secondary | ICD-10-CM | POA: Insufficient documentation

## 2019-09-06 DIAGNOSIS — D508 Other iron deficiency anemias: Secondary | ICD-10-CM

## 2019-09-06 DIAGNOSIS — K219 Gastro-esophageal reflux disease without esophagitis: Secondary | ICD-10-CM | POA: Insufficient documentation

## 2019-09-06 DIAGNOSIS — D72829 Elevated white blood cell count, unspecified: Secondary | ICD-10-CM | POA: Diagnosis not present

## 2019-09-06 DIAGNOSIS — Z814 Family history of other substance abuse and dependence: Secondary | ICD-10-CM | POA: Insufficient documentation

## 2019-09-06 DIAGNOSIS — Z818 Family history of other mental and behavioral disorders: Secondary | ICD-10-CM | POA: Insufficient documentation

## 2019-09-06 DIAGNOSIS — D509 Iron deficiency anemia, unspecified: Secondary | ICD-10-CM | POA: Insufficient documentation

## 2019-09-06 DIAGNOSIS — K5080 Crohn's disease of both small and large intestine without complications: Secondary | ICD-10-CM | POA: Insufficient documentation

## 2019-09-06 DIAGNOSIS — Z87891 Personal history of nicotine dependence: Secondary | ICD-10-CM | POA: Diagnosis not present

## 2019-09-06 DIAGNOSIS — E559 Vitamin D deficiency, unspecified: Secondary | ICD-10-CM | POA: Insufficient documentation

## 2019-09-06 DIAGNOSIS — R69 Illness, unspecified: Secondary | ICD-10-CM | POA: Diagnosis not present

## 2019-09-06 MED ORDER — SODIUM CHLORIDE 0.9 % IV SOLN
200.0000 mg | Freq: Once | INTRAVENOUS | Status: AC
  Administered 2019-09-06: 200 mg via INTRAVENOUS
  Filled 2019-09-06: qty 200

## 2019-09-06 MED ORDER — SODIUM CHLORIDE 0.9 % IV SOLN: Freq: Once | INTRAVENOUS | Status: AC

## 2019-09-06 MED ORDER — METHYLPREDNISOLONE SODIUM SUCC 125 MG IJ SOLR
125.0000 mg | Freq: Once | INTRAMUSCULAR | Status: AC
  Administered 2019-09-06: 125 mg via INTRAVENOUS
  Filled 2019-09-06: qty 2

## 2019-09-06 MED ORDER — DIPHENHYDRAMINE HCL 50 MG/ML IJ SOLN
12.5000 mg | Freq: Once | INTRAMUSCULAR | Status: AC
  Administered 2019-09-06: 12.5 mg via INTRAVENOUS
  Filled 2019-09-06: qty 1

## 2019-09-06 NOTE — Progress Notes (Signed)
Patient presents today for Feraheme infusion. Vital signs are stable. Patient has no complaints of any changes since her last visit. Patient denies pain today. MAR reviewed and updated. Feraheme given today per MD orders. Tolerated infusion without adverse affects. Vital signs stable. No complaints at this time. Discharged from clinic ambulatory. F/U with [facility] as scheduled.

## 2019-09-06 NOTE — Patient Instructions (Signed)
[facility] at [facility]  Discharge Instructions:   _______________________________________________________________  Thank you for choosing [facility] at [facility] to provide your oncology and hematology care.  To afford each patient quality time with our providers, please arrive at least 15 minutes before your scheduled appointment.  You need to re-schedule your appointment if you arrive 10 or more minutes late.  We strive to give you quality time with our providers, and arriving late affects you and other patients whose appointments are after yours.  Also, if you no show three or more times for appointments you may be dismissed from the clinic.  Again, thank you for choosing [facility] at [facility]. Our hope is that these requests will allow you access to exceptional care and in a timely manner. _______________________________________________________________  If you have questions after your visit, please contact our office at [phone] between the hours of 8:30 a.m. and 5:00 p.m. Voicemails left after 4:30 p.m. will not be returned until the following business day. _______________________________________________________________  For prescription refill requests, have your pharmacy contact our office. _______________________________________________________________  Recommendations made by the consultant and any test results will be sent to your referring physician. _______________________________________________________________ 

## 2019-09-11 ENCOUNTER — Inpatient Hospital Stay (HOSPITAL_COMMUNITY): Payer: Medicare HMO

## 2019-09-11 ENCOUNTER — Ambulatory Visit (HOSPITAL_COMMUNITY)
Admission: RE | Admit: 2019-09-11 | Discharge: 2019-09-11 | Disposition: A | Discharge status: Home / Self Care | Payer: Medicare HMO | Source: Ambulatory Visit | Attending: Family Medicine | Admitting: Family Medicine

## 2019-09-11 ENCOUNTER — Encounter (HOSPITAL_COMMUNITY): Payer: Self-pay

## 2019-09-11 ENCOUNTER — Other Ambulatory Visit: Payer: Self-pay

## 2019-09-11 VITALS — BP 114/67 | HR 87 | Temp 97.2°F | Resp 18

## 2019-09-11 DIAGNOSIS — Z1231 Encounter for screening mammogram for malignant neoplasm of breast: Secondary | ICD-10-CM | POA: Insufficient documentation

## 2019-09-11 DIAGNOSIS — K219 Gastro-esophageal reflux disease without esophagitis: Secondary | ICD-10-CM | POA: Diagnosis not present

## 2019-09-11 DIAGNOSIS — D508 Other iron deficiency anemias: Secondary | ICD-10-CM

## 2019-09-11 DIAGNOSIS — Z79899 Other long term (current) drug therapy: Secondary | ICD-10-CM | POA: Diagnosis not present

## 2019-09-11 DIAGNOSIS — D509 Iron deficiency anemia, unspecified: Secondary | ICD-10-CM | POA: Diagnosis not present

## 2019-09-11 DIAGNOSIS — D72829 Elevated white blood cell count, unspecified: Secondary | ICD-10-CM | POA: Diagnosis not present

## 2019-09-11 DIAGNOSIS — E559 Vitamin D deficiency, unspecified: Secondary | ICD-10-CM | POA: Diagnosis not present

## 2019-09-11 DIAGNOSIS — Z836 Family history of other diseases of the respiratory system: Secondary | ICD-10-CM | POA: Diagnosis not present

## 2019-09-11 DIAGNOSIS — Z87891 Personal history of nicotine dependence: Secondary | ICD-10-CM | POA: Diagnosis not present

## 2019-09-11 DIAGNOSIS — K5080 Crohn's disease of both small and large intestine without complications: Secondary | ICD-10-CM | POA: Diagnosis not present

## 2019-09-11 DIAGNOSIS — R69 Illness, unspecified: Secondary | ICD-10-CM | POA: Diagnosis not present

## 2019-09-11 MED ORDER — SODIUM CHLORIDE 0.9 % IV SOLN
200.0000 mg | Freq: Once | INTRAVENOUS | Status: AC
  Administered 2019-09-11: 200 mg via INTRAVENOUS
  Filled 2019-09-11: qty 200

## 2019-09-11 MED ORDER — METHYLPREDNISOLONE SODIUM SUCC 125 MG IJ SOLR
125.0000 mg | Freq: Once | INTRAMUSCULAR | Status: AC
  Administered 2019-09-11: 125 mg via INTRAVENOUS

## 2019-09-11 MED ORDER — DIPHENHYDRAMINE HCL 50 MG/ML IJ SOLN
12.5000 mg | Freq: Once | INTRAMUSCULAR | Status: AC
  Administered 2019-09-11: 12.5 mg via INTRAVENOUS

## 2019-09-11 MED ORDER — METHYLPREDNISOLONE SODIUM SUCC 125 MG IJ SOLR
INTRAMUSCULAR | Status: AC
  Filled 2019-09-11: qty 2

## 2019-09-11 MED ORDER — DIPHENHYDRAMINE HCL 50 MG/ML IJ SOLN
INTRAMUSCULAR | Status: AC
  Filled 2019-09-11: qty 1

## 2019-09-11 MED ORDER — SODIUM CHLORIDE 0.9 % IV SOLN: Freq: Once | INTRAVENOUS | Status: AC

## 2019-09-11 NOTE — Patient Instructions (Signed)
[facility] at [facility]  Discharge Instructions:   _______________________________________________________________  Thank you for choosing [facility] at [facility] to provide your oncology and hematology care.  To afford each patient quality time with our providers, please arrive at least 15 minutes before your scheduled appointment.  You need to re-schedule your appointment if you arrive 10 or more minutes late.  We strive to give you quality time with our providers, and arriving late affects you and other patients whose appointments are after yours.  Also, if you no show three or more times for appointments you may be dismissed from the clinic.  Again, thank you for choosing [facility] at [facility]. Our hope is that these requests will allow you access to exceptional care and in a timely manner. _______________________________________________________________  If you have questions after your visit, please contact our office at [phone] between the hours of 8:30 a.m. and 5:00 p.m. Voicemails left after 4:30 p.m. will not be returned until the following business day. _______________________________________________________________  For prescription refill requests, have your pharmacy contact our office. _______________________________________________________________  Recommendations made by the consultant and any test results will be sent to your referring physician. _______________________________________________________________ 

## 2019-09-11 NOTE — Progress Notes (Signed)
Patient presents today for Feraheme infusion. Vital signs stable. Patient has no complaints of any pain today. Patient denies any changes since her last visit.   Feraheme given today per MD orders. Tolerated infusion without adverse affects. Vital signs stable. No complaints at this time. Discharged from clinic ambulatory. F/U with [facility] as scheduled.

## 2019-09-13 ENCOUNTER — Other Ambulatory Visit: Payer: Self-pay | Admitting: Family Medicine

## 2019-09-16 ENCOUNTER — Other Ambulatory Visit (HOSPITAL_COMMUNITY): Payer: Self-pay | Admitting: Family Medicine

## 2019-09-16 DIAGNOSIS — R928 Other abnormal and inconclusive findings on diagnostic imaging of breast: Secondary | ICD-10-CM

## 2019-09-16 MED ORDER — CYCLOBENZAPRINE HCL 10 MG PO TABS: ORAL_TABLET | ORAL | 5 refills | Status: DC

## 2019-09-19 ENCOUNTER — Encounter (HOSPITAL_COMMUNITY): Payer: Self-pay

## 2019-09-19 ENCOUNTER — Ambulatory Visit (HOSPITAL_COMMUNITY)
Admission: RE | Admit: 2019-09-19 | Discharge: 2019-09-19 | Disposition: A | Discharge status: Home / Self Care | Payer: Medicare HMO | Source: Ambulatory Visit | Attending: Family Medicine | Admitting: Family Medicine

## 2019-09-19 ENCOUNTER — Other Ambulatory Visit: Payer: Self-pay

## 2019-09-19 DIAGNOSIS — R922 Inconclusive mammogram: Secondary | ICD-10-CM | POA: Diagnosis not present

## 2019-09-19 DIAGNOSIS — R928 Other abnormal and inconclusive findings on diagnostic imaging of breast: Secondary | ICD-10-CM | POA: Diagnosis not present

## 2019-09-19 DIAGNOSIS — N6489 Other specified disorders of breast: Secondary | ICD-10-CM | POA: Diagnosis not present

## 2019-10-01 ENCOUNTER — Encounter: Payer: Self-pay | Admitting: Nurse Practitioner

## 2019-10-01 ENCOUNTER — Ambulatory Visit: Payer: Medicare HMO | Admitting: Nurse Practitioner

## 2019-10-01 ENCOUNTER — Other Ambulatory Visit: Payer: Self-pay

## 2019-10-01 VITALS — BP 127/77 | HR 81 | Temp 98.2°F | Ht 67.0 in | Wt 183.8 lb

## 2019-10-01 DIAGNOSIS — K5080 Crohn's disease of both small and large intestine without complications: Secondary | ICD-10-CM | POA: Diagnosis not present

## 2019-10-01 DIAGNOSIS — K219 Gastro-esophageal reflux disease without esophagitis: Secondary | ICD-10-CM | POA: Diagnosis not present

## 2019-10-01 NOTE — Assessment & Plan Note (Signed)
History of ileocolonic Crohn's disease status post right hemicolectomy with recent colonoscopy and intubation of the neoterminal ileum.  Overall disease felt to be well managed.  Shortly thereafter in March 2021 she had a flare with stricturing and inflammation.  Her Humira drug level was found to be low at trough level.  She was increased to Humira once weekly.  She did get accepted for patient assistance and has been taking this medication as recommended.  She is not had any further flares since.  Recommend she continue her current medications, let us know of any flare symptoms.  Labs are up-to-date (June 2021).  No need for repeat labs at this time.  Follow-up in 6 months for routine care.
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
## 2019-10-01 NOTE — Patient Instructions (Signed)
Your health issues we discussed today were:   Crohn's disease: 1. I am glad you are doing well! 2. Continue to take Humira once a week 3. Let us know if you have any difficulties obtaining your medication refills 4. Let us now if you have any signs of a flare such as worsening abdominal pain, diarrhea, bloody stools, decreased appetite, worsening energy level. 5. Your labs are up-to-date so we do not need to do these today  GERD (reflux/heartburn): 1. I am glad you are doing well on your medication! 2. Continue to take Prilosec 20 mg twice daily. 3. Avoid triggers that tend to cause flares of your GERD symptoms 4. Let us know if you have any worsening or recurrent symptoms  Overall I recommend:  1. Continue your other current medications 2. Return for follow-up in 6 months 3. Call us if you have any questions or concerns   ---------------------------------------------------------------  I am glad you have gotten your COVID-19 vaccination!  Even though you are fully vaccinated you should continue to follow CDC and state/local guidelines.  ---------------------------------------------------------------   At [facility] we value your feedback. You may receive a survey about your visit today. Please share your experience as we strive to create trusting relationships with our patients to provide genuine, compassionate, quality care.  We appreciate your understanding and patience as we review any laboratory studies, imaging, and other diagnostic tests that are ordered as we care for you. Our office policy is 5 business days for review of these results, and any emergent or urgent results are addressed in a timely manner for your best interest. If you do not hear from our office in 1 week, please contact us.   We also encourage the use of MyChart, which contains your medical information for your review as well. If you are not enrolled in this feature, an access code is on this  after visit summary for your convenience. Thank you for allowing [place] to be involved in your care.  It was great to see you today!  I hope you have a great Summer!!

## 2019-10-01 NOTE — Progress Notes (Signed)
CC'ED TO PCP 

## 2019-10-01 NOTE — Assessment & Plan Note (Signed)
Symptoms doing well overall.  She did have a quite a significant flare a while back when she went out and had cookout (greasy food).  She learned she cannot eat out with her GERD symptoms as she typically has flares only when she eats out.  Recommend she continue to avoid triggers, continue Pepcid 20 mg twice daily.  Overall her GERD symptoms are well managed on her PPI.  Follow-up in 6 months.  Call for any worsening or severe symptoms.

## 2019-10-23 ENCOUNTER — Other Ambulatory Visit: Payer: Self-pay | Admitting: Gastroenterology

## 2019-10-23 NOTE — Telephone Encounter (Signed)
Does patient need Zofran refilled? Looks like she was doing well when she saw Randall Hiss in July 2021. Denied N/V. I suspect prior Rx was for more of an acute event.

## 2019-10-23 NOTE — Telephone Encounter (Signed)
Lmom, waiting on a return call.  

## 2019-11-13 ENCOUNTER — Other Ambulatory Visit: Payer: Self-pay | Admitting: Family Medicine

## 2019-12-02 ENCOUNTER — Encounter: Payer: Self-pay | Admitting: Family Medicine

## 2019-12-03 ENCOUNTER — Telehealth: Payer: Self-pay

## 2019-12-03 DIAGNOSIS — Z13220 Encounter for screening for lipoid disorders: Secondary | ICD-10-CM

## 2019-12-03 DIAGNOSIS — E669 Obesity, unspecified: Secondary | ICD-10-CM

## 2019-12-03 NOTE — Telephone Encounter (Signed)
[facility] sent [facility]

## 2019-12-10 ENCOUNTER — Other Ambulatory Visit: Payer: Self-pay

## 2019-12-10 ENCOUNTER — Emergency Department (HOSPITAL_COMMUNITY)
Admission: EM | Admit: 2019-12-10 | Discharge: 2019-12-10 | Disposition: A | Discharge status: Home / Self Care | Payer: Medicare HMO | Attending: Emergency Medicine | Admitting: Emergency Medicine

## 2019-12-10 ENCOUNTER — Encounter (HOSPITAL_COMMUNITY): Payer: Self-pay

## 2019-12-10 ENCOUNTER — Emergency Department (HOSPITAL_COMMUNITY): Payer: Medicare HMO

## 2019-12-10 ENCOUNTER — Other Ambulatory Visit (HOSPITAL_COMMUNITY): Payer: Self-pay

## 2019-12-10 DIAGNOSIS — M25512 Pain in left shoulder: Secondary | ICD-10-CM | POA: Insufficient documentation

## 2019-12-10 DIAGNOSIS — D508 Other iron deficiency anemias: Secondary | ICD-10-CM

## 2019-12-10 DIAGNOSIS — Z87891 Personal history of nicotine dependence: Secondary | ICD-10-CM | POA: Insufficient documentation

## 2019-12-10 DIAGNOSIS — Z7951 Long term (current) use of inhaled steroids: Secondary | ICD-10-CM | POA: Diagnosis not present

## 2019-12-10 DIAGNOSIS — Z91010 Allergy to peanuts: Secondary | ICD-10-CM | POA: Diagnosis not present

## 2019-12-10 DIAGNOSIS — J45909 Unspecified asthma, uncomplicated: Secondary | ICD-10-CM | POA: Insufficient documentation

## 2019-12-10 DIAGNOSIS — Y92410 Unspecified street and highway as the place of occurrence of the external cause: Secondary | ICD-10-CM | POA: Diagnosis not present

## 2019-12-10 DIAGNOSIS — Z041 Encounter for examination and observation following transport accident: Secondary | ICD-10-CM | POA: Diagnosis not present
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
## 2019-12-10 NOTE — Discharge Instructions (Signed)
As discussed, your evaluation today has been largely reassuring.  But, it is important that you monitor your condition carefully, and do not hesitate to return to the ED if you develop new, or concerning changes in your condition. ? ?Otherwise, please follow-up with your physician for appropriate ongoing care. ? ?

## 2019-12-11 ENCOUNTER — Inpatient Hospital Stay (HOSPITAL_COMMUNITY): Payer: Medicare HMO | Attending: Nurse Practitioner

## 2019-12-11 ENCOUNTER — Other Ambulatory Visit: Payer: Self-pay

## 2019-12-11 DIAGNOSIS — K219 Gastro-esophageal reflux disease without esophagitis: Secondary | ICD-10-CM | POA: Diagnosis not present

## 2019-12-11 DIAGNOSIS — Z87891 Personal history of nicotine dependence: Secondary | ICD-10-CM | POA: Diagnosis not present

## 2019-12-11 DIAGNOSIS — E559 Vitamin D deficiency, unspecified: Secondary | ICD-10-CM | POA: Diagnosis not present

## 2019-12-11 DIAGNOSIS — Z836 Family history of other diseases of the respiratory system: Secondary | ICD-10-CM | POA: Insufficient documentation

## 2019-12-11 DIAGNOSIS — Z814 Family history of other substance abuse and dependence: Secondary | ICD-10-CM | POA: Insufficient documentation

## 2019-12-11 DIAGNOSIS — D72829 Elevated white blood cell count, unspecified: Secondary | ICD-10-CM | POA: Diagnosis not present

## 2019-12-11 DIAGNOSIS — D509 Iron deficiency anemia, unspecified: Secondary | ICD-10-CM | POA: Insufficient documentation

## 2019-12-11 DIAGNOSIS — R69 Illness, unspecified: Secondary | ICD-10-CM | POA: Diagnosis not present

## 2019-12-11 DIAGNOSIS — K5080 Crohn's disease of both small and large intestine without complications: Secondary | ICD-10-CM | POA: Insufficient documentation

## 2019-12-11 DIAGNOSIS — Z79899 Other long term (current) drug therapy: Secondary | ICD-10-CM | POA: Diagnosis not present

## 2019-12-11 DIAGNOSIS — D508 Other iron deficiency anemias: Secondary | ICD-10-CM

## 2019-12-11 DIAGNOSIS — Z818 Family history of other mental and behavioral disorders: Secondary | ICD-10-CM | POA: Insufficient documentation

## 2019-12-11 LAB — IRON AND TIBC
Iron: 97 ug/dL (ref 28–170)
Saturation Ratios: 32 % — ABNORMAL HIGH (ref 10.4–31.8)
TIBC: 304 ug/dL (ref 250–450)
UIBC: 207 ug/dL

## 2019-12-11 LAB — COMPREHENSIVE METABOLIC PANEL
ALT: 14 U/L (ref 0–44)
AST: 15 U/L (ref 15–41)
Albumin: 3.3 g/dL — ABNORMAL LOW (ref 3.5–5.0)
Alkaline Phosphatase: 64 U/L (ref 38–126)
Anion gap: 6 (ref 5–15)
BUN: 9 mg/dL (ref 6–20)
CO2: 27 mmol/L (ref 22–32)
Calcium: 8.7 mg/dL — ABNORMAL LOW (ref 8.9–10.3)
Chloride: 104 mmol/L (ref 98–111)
Creatinine, Ser: 0.66 mg/dL (ref 0.44–1.00)
GFR calc non Af Amer: >60 mL/min (ref >60)
Glucose, Bld: 74 mg/dL (ref 70–99)
Potassium: 4.3 mmol/L (ref 3.5–5.1)
Sodium: 137 mmol/L (ref 135–145)
Total Bilirubin: 0.5 mg/dL (ref 0.3–1.2)
Total Protein: 7.6 g/dL (ref 6.5–8.1)

## 2019-12-11 LAB — CBC WITH DIFFERENTIAL/PLATELET
Abs Immature Granulocytes: 0.02 10*3/uL (ref 0.00–0.07)
Basophils Absolute: 0.1 10*3/uL (ref 0.0–0.1)
Basophils Relative: 1 %
Eosinophils Absolute: 0.6 10*3/uL — ABNORMAL HIGH (ref 0.0–0.5)
Eosinophils Relative: 6 %
HCT: 40.5 % (ref 36.0–46.0)
Hemoglobin: 12.8 g/dL (ref 12.0–15.0)
Immature Granulocytes: 0 %
Lymphocytes Relative: 34 %
Lymphs Abs: 3.5 10*3/uL (ref 0.7–4.0)
MCH: 27.5 pg (ref 26.0–34.0)
MCHC: 31.6 g/dL (ref 30.0–36.0)
MCV: 86.9 fL (ref 80.0–100.0)
Monocytes Absolute: 0.6 10*3/uL (ref 0.1–1.0)
Monocytes Relative: 6 %
Neutro Abs: 5.4 10*3/uL (ref 1.7–7.7)
Neutrophils Relative %: 53 %
Platelets: 403 10*3/uL — ABNORMAL HIGH (ref 150–400)
RBC: 4.66 MIL/uL (ref 3.87–5.11)
RDW: 15.3 % (ref 11.5–15.5)
WBC: 10.2 10*3/uL (ref 4.0–10.5)
nRBC: 0 % (ref 0.0–0.2)

## 2019-12-11 LAB — LACTATE DEHYDROGENASE: LDH: 123 U/L (ref 98–192)

## 2019-12-11 LAB — FOLATE: Folate: 22.1 ng/mL (ref >5.9)

## 2019-12-11 LAB — FERRITIN: Ferritin: 193 ng/mL (ref 11–307)

## 2019-12-11 LAB — VITAMIN D 25 HYDROXY (VIT D DEFICIENCY, FRACTURES): Vit D, 25-Hydroxy: 40.28 ng/mL (ref 30–100)

## 2019-12-11 LAB — VITAMIN B12: Vitamin B-12: 1031 pg/mL — ABNORMAL HIGH (ref 180–914)

## 2019-12-12 ENCOUNTER — Other Ambulatory Visit (HOSPITAL_COMMUNITY): Payer: Medicare HMO

## 2019-12-16 ENCOUNTER — Ambulatory Visit (INDEPENDENT_AMBULATORY_CARE_PROVIDER_SITE_OTHER): Payer: Medicare HMO | Admitting: Family Medicine

## 2019-12-16 ENCOUNTER — Other Ambulatory Visit: Payer: Self-pay

## 2019-12-16 ENCOUNTER — Encounter: Payer: Self-pay | Admitting: Family Medicine

## 2019-12-16 VITALS — BP 110/70 | HR 90 | Resp 20 | Ht 67.5 in | Wt 175.4 lb

## 2019-12-16 DIAGNOSIS — Z23 Encounter for immunization: Secondary | ICD-10-CM | POA: Diagnosis not present

## 2019-12-16 DIAGNOSIS — Z0000 Encounter for general adult medical examination without abnormal findings: Secondary | ICD-10-CM | POA: Diagnosis not present

## 2019-12-16 NOTE — Patient Instructions (Addendum)
F/U in office with MD in 6 months, call if you need me before  Flu vaccine today  Vision screen today   Check with pharmacy re TdAP cost and get this if affordable as due, otherwise if you get a cut call in for [place] to administer   CONGRATS on excellent weight loss, continue healthy food choice , and increase exercise to 150 minutes  TSH and hepatitis C screen will be added to recent labs   Stop additonal Vit B 12, and increase calcium to one twice daily. bEST for the next 50!!! Think about what you will eat, plan ahead. Choose " clean, green, fresh or frozen" over canned, processed or packaged foods which are more sugary, salty and fatty. 70 to 75% of food eaten should be vegetables and fruit. Three meals at set times with snacks allowed between meals, but they must be fruit or vegetables. Aim to eat over a 12 hour period , example 7 am to 7 pm, and STOP after  your last meal of the day. Drink water,generally about 64 ounces per day, no other drink is as healthy. Fruit juice is best enjoyed in a healthy way, by EATING the fruit. It is important that you exercise regularly at least 30 minutes 5 times a week. If you develop chest pain, have severe difficulty breathing, or feel very tired, stop exercising immediately and seek medical attention  Thanks for choosing [facility], we consider it a privelige to serve you.

## 2019-12-17 ENCOUNTER — Encounter: Payer: Self-pay | Admitting: Family Medicine

## 2019-12-17 NOTE — Assessment & Plan Note (Signed)

## 2019-12-17 NOTE — Progress Notes (Signed)
    [name]     MRN: [id number]      DOB: [birth year]  HPI: Patient is in for annual physical exam. No other health concerns are expressed or addressed at the visit.Feels very well with  No concerns. Has committed to healthy diet and regular exercise with steady weight loss and overall improvement in health Recent labs, if available Immunization is reviewed , and  updated if needed.   PE: BP 110/70   Pulse 90   Resp 20   Ht 5' 7.5" (1.715 m)   Wt 175 lb 6.4 oz (79.6 kg)   LMP 12/01/2019   SpO2 96%   BMI 27.07 kg/m   Pleasant  female, alert and oriented x 3, in no cardio-pulmonary distress. Afebrile. HEENT No facial trauma or asymetry. Sinuses non tender.  Extra occullar muscles intact.. External ears normal, . Neck: supple, no adenopathy,JVD or thyromegaly.No bruits.  Chest: Clear to ascultation bilaterally.No crackles or wheezes. Non tender to palpation  Breast: Not examined, mammogram normal in 09/2019 Cardiovascular system; Heart sounds normal,  S1 and  S2 ,no S3.  No murmur, or thrill. Apical beat not displaced Peripheral pulses normal.  Abdomen: Soft, non tender,   GU: Asymptomatic, not examined   Musculoskeletal exam: Full ROM of spine, hips , shoulders and knees. No deformity ,swelling or crepitus noted. No muscle wasting or atrophy.   Neurologic: Cranial nerves 2 to 12 intact. Power, tone ,sensation and reflexes normal throughout. No disturbance in gait. No tremor.  Skin: Intact, no ulceration, erythema , scaling or rash noted. Pigmentation normal throughout  Psych; Normal mood and affect. Judgement and concentration normal   Assessment & Plan:  Annual physical exam Annual exam as documented. Counseling done  re healthy lifestyle involving commitment to 150 minutes exercise per week, heart healthy diet, and attaining healthy weight.The importance of adequate sleep also discussed. Regular seat belt use and home safety, is also  discussed. Changes in health habits are decided on by the patient with goals and time frames  set for achieving them. Immunization and cancer screening needs are specifically addressed at this visit.

## 2019-12-19 ENCOUNTER — Inpatient Hospital Stay (HOSPITAL_BASED_OUTPATIENT_CLINIC_OR_DEPARTMENT_OTHER): Payer: Medicare HMO | Admitting: Hematology

## 2019-12-19 ENCOUNTER — Other Ambulatory Visit: Payer: Self-pay

## 2019-12-19 DIAGNOSIS — D508 Other iron deficiency anemias: Secondary | ICD-10-CM | POA: Diagnosis not present

## 2019-12-19 NOTE — Progress Notes (Signed)
Virtual Visit via Telephone Note  I connected with [name] on 12/19/19 at  4:15 PM EDT by telephone and verified that I am speaking with the correct person using two identifiers.   I discussed the limitations, risks, security and privacy concerns of performing an evaluation and management service by telephone and the availability of in person appointments. I also discussed with the patient that there may be a patient responsible charge related to this service. The patient expressed understanding and agreed to proceed.   History of Present Illness: She is followed in our clinic for iron deficiency anemia and leukocytosis.  She also has vitamin D deficiency.   Observations/Objective: She denies any bleeding per rectum or melena.  She is taking Niferex 1 tablet daily.  She is having menses with moderate amount of bleeding, 4 out of every 28 days.  She felt that her energy levels have improved quite well after Venofer infusion.  Assessment and Plan:  1.  Iron deficiency anemia: -Last Venofer was on 09/11/2019. -Reviewed labs from 12/11/2019.  Hemoglobin is 12.8.  Ferritin is 193 and percent saturation is 32.  R51 and folic acid was normal. -She has menstrual blood loss contributing to her anemia. -No parenteral iron therapy needed at this time.  RTC 4 months with repeat labs.  2.  Vitamin D deficiency: -Vitamin D level is 40.  Continue vitamin D 50,000 units weekly.  3.  Leukocytosis: -Her white count normalized to 10.2.  Differential was normal with slight elevation of absolute eosinophil count.   Follow Up Instructions:   RTC 4 months with labs prior. I discussed the assessment and treatment plan with the patient. The patient was provided an opportunity to ask questions and all were answered. The patient agreed with the plan and demonstrated an understanding of the instructions.   The patient was advised to call back or seek an in-person evaluation if the symptoms worsen or if the  condition fails to improve as anticipated.  I provided 11 minutes of non-face-to-face time during this encounter.   Derek Jack, MD

## 2020-03-01 ENCOUNTER — Other Ambulatory Visit: Payer: Self-pay | Admitting: Family Medicine

## 2020-04-02 ENCOUNTER — Other Ambulatory Visit: Payer: Self-pay

## 2020-04-02 ENCOUNTER — Encounter: Payer: Self-pay | Admitting: Nurse Practitioner

## 2020-04-02 ENCOUNTER — Ambulatory Visit (INDEPENDENT_AMBULATORY_CARE_PROVIDER_SITE_OTHER): Payer: Medicare HMO | Admitting: Nurse Practitioner

## 2020-04-02 VITALS — BP 127/79 | HR 74 | Temp 96.9°F | Ht 67.0 in | Wt 183.8 lb

## 2020-04-02 DIAGNOSIS — K50019 Crohn's disease of small intestine with unspecified complications: Secondary | ICD-10-CM | POA: Diagnosis not present

## 2020-04-02 DIAGNOSIS — K219 Gastro-esophageal reflux disease without esophagitis: Secondary | ICD-10-CM | POA: Diagnosis not present

## 2020-04-02 NOTE — Patient Instructions (Signed)
Your health issues we discussed today were:   Crohn's disease: 1. I am glad you are doing better! 2. Continue to take Humira once a week 3. Have your labs completed when you are able to 4. Call us if you have any worsening symptoms or concerning symptoms  GERD (reflux/heartburn): 1. Continue to avoid foods that trigger your symptoms 2. Continue taking Prilosec 20 mg once daily as it seems to be working well for you 3. Let us know if any worsening or severe symptoms  Overall I recommend:  1. Continue your other current medications 2. Return for follow-up in 6 months 3. Call should any questions or concerns   ---------------------------------------------------------------  I am glad you have gotten your COVID-19 vaccination!  Even though you are fully vaccinated you should continue to follow CDC and state/local guidelines.  ---------------------------------------------------------------   At [facility] we value your feedback. You may receive a survey about your visit today. Please share your experience as we strive to create trusting relationships with our patients to provide genuine, compassionate, quality care.  We appreciate your understanding and patience as we review any laboratory studies, imaging, and other diagnostic tests that are ordered as we care for you. Our office policy is 5 business days for review of these results, and any emergent or urgent results are addressed in a timely manner for your best interest. If you do not hear from our office in 1 week, please contact us.   We also encourage the use of MyChart, which contains your medical information for your review as well. If you are not enrolled in this feature, an access code is on this after visit summary for your convenience. Thank you for allowing [place] to be involved in your care.  It was great to see you today!  I hope you have a safe and warm winter!!
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
## 2020-04-20 ENCOUNTER — Other Ambulatory Visit: Payer: Self-pay

## 2020-04-20 ENCOUNTER — Inpatient Hospital Stay (HOSPITAL_COMMUNITY): Payer: Medicare HMO | Attending: Hematology

## 2020-04-20 DIAGNOSIS — Z79899 Other long term (current) drug therapy: Secondary | ICD-10-CM | POA: Diagnosis not present

## 2020-04-20 DIAGNOSIS — Z814 Family history of other substance abuse and dependence: Secondary | ICD-10-CM | POA: Diagnosis not present

## 2020-04-20 DIAGNOSIS — D508 Other iron deficiency anemias: Secondary | ICD-10-CM

## 2020-04-20 DIAGNOSIS — Z836 Family history of other diseases of the respiratory system: Secondary | ICD-10-CM | POA: Insufficient documentation

## 2020-04-20 DIAGNOSIS — D72829 Elevated white blood cell count, unspecified: Secondary | ICD-10-CM | POA: Insufficient documentation

## 2020-04-20 DIAGNOSIS — D509 Iron deficiency anemia, unspecified: Secondary | ICD-10-CM | POA: Insufficient documentation

## 2020-04-20 DIAGNOSIS — Z818 Family history of other mental and behavioral disorders: Secondary | ICD-10-CM | POA: Diagnosis not present

## 2020-04-20 DIAGNOSIS — Z87891 Personal history of nicotine dependence: Secondary | ICD-10-CM | POA: Insufficient documentation

## 2020-04-20 DIAGNOSIS — R69 Illness, unspecified: Secondary | ICD-10-CM | POA: Diagnosis not present

## 2020-04-20 DIAGNOSIS — E559 Vitamin D deficiency, unspecified: Secondary | ICD-10-CM | POA: Insufficient documentation

## 2020-04-20 DIAGNOSIS — K5080 Crohn's disease of both small and large intestine without complications: Secondary | ICD-10-CM | POA: Insufficient documentation

## 2020-04-20 DIAGNOSIS — K219 Gastro-esophageal reflux disease without esophagitis: Secondary | ICD-10-CM | POA: Diagnosis not present

## 2020-04-20 LAB — CBC WITH DIFFERENTIAL/PLATELET
Abs Immature Granulocytes: 0.02 10*3/uL (ref 0.00–0.07)
Basophils Absolute: 0.1 10*3/uL (ref 0.0–0.1)
Basophils Relative: 1 %
Eosinophils Absolute: 0.4 10*3/uL (ref 0.0–0.5)
Eosinophils Relative: 5 %
HCT: 41 % (ref 36.0–46.0)
Hemoglobin: 13.1 g/dL (ref 12.0–15.0)
Immature Granulocytes: 0 %
Lymphocytes Relative: 40 %
Lymphs Abs: 3.8 10*3/uL (ref 0.7–4.0)
MCH: 27.3 pg (ref 26.0–34.0)
MCHC: 32 g/dL (ref 30.0–36.0)
MCV: 85.6 fL (ref 80.0–100.0)
Monocytes Absolute: 0.6 10*3/uL (ref 0.1–1.0)
Monocytes Relative: 7 %
Neutro Abs: 4.5 10*3/uL (ref 1.7–7.7)
Neutrophils Relative %: 47 %
Platelets: 382 10*3/uL (ref 150–400)
RBC: 4.79 MIL/uL (ref 3.87–5.11)
RDW: 14.6 % (ref 11.5–15.5)
WBC: 9.4 10*3/uL (ref 4.0–10.5)
nRBC: 0 % (ref 0.0–0.2)

## 2020-04-20 LAB — IRON AND TIBC
Iron: 89 ug/dL (ref 28–170)
Saturation Ratios: 26 % (ref 10.4–31.8)
TIBC: 338 ug/dL (ref 250–450)
UIBC: 249 ug/dL

## 2020-04-20 LAB — FERRITIN: Ferritin: 141 ng/mL (ref 11–307)

## 2020-04-20 NOTE — Progress Notes (Signed)
Virtual Visit via Telephone Note  I connected with [name] on 04/20/20 at  9:10 AM EST by telephone and verified that I am speaking with the correct person using two identifiers.   I discussed the limitations, risks, security and privacy concerns of performing an evaluation and management service by telephone and the availability of in person appointments. I also discussed with the patient that there may be a patient responsible charge related to this service. The patient expressed understanding and agreed to proceed.   History of Present Illness: She is followed in our clinic for iron deficiency anemia and leukocytosis.  She also has vitamin D deficiency.   Observations/Objective: She denies any bleeding per rectum or melena.  She is taking Niferex 1 tablet daily.  Her menses has improved recently and she denies any heavy cycles.  Her energy level continues to be improved and is stable at this time.  She is trying to add in iron rich foods in her diet.  Assessment and Plan:  1.  Iron deficiency anemia: -Last Venofer was on 09/11/2019. -Reviewed labs from 04/20/2020.  Hemoglobin is 13.1.  Ferritin is 141 and iron saturation is 26%. -Denies heavy menstrual cycles at this time. -Has been compliant with her iron supplements. -No parenteral iron therapy needed at this time.  RTC 4 months with repeat labs.  2.  Vitamin D deficiency: -Vitamin D level is 40.  Continue vitamin D 50,000 units weekly.  3.  Leukocytosis: -Her white count normalized to 9.4.  Differential was normal.  4. Heavy Menstrual cycles: -Normal flow at this time  Follow Up Instructions:  -RTC 4 months with labs (CBC, Ferritin, iron, b12, vitamin d) and assessment.   Greater than 50% was spent in counseling and coordination of care with this patient including but not limited to discussion of the relevant topics above (See A&P) including, but not limited to diagnosis and management of acute and chronic medical  conditions.   The patient was advised to call back or seek an in-person evaluation if the symptoms worsen or if the condition fails to improve as anticipated.  I provided 12 minutes of non-face-to-face time during this encounter.   Jacquelin Hawking, NP

## 2020-04-21 ENCOUNTER — Inpatient Hospital Stay (HOSPITAL_BASED_OUTPATIENT_CLINIC_OR_DEPARTMENT_OTHER): Payer: Medicare HMO | Admitting: Oncology

## 2020-04-21 ENCOUNTER — Other Ambulatory Visit: Payer: Self-pay

## 2020-04-21 DIAGNOSIS — D508 Other iron deficiency anemias: Secondary | ICD-10-CM

## 2020-04-23 ENCOUNTER — Other Ambulatory Visit (HOSPITAL_COMMUNITY): Payer: Medicare HMO

## 2020-04-30 ENCOUNTER — Ambulatory Visit (HOSPITAL_COMMUNITY): Payer: Medicare HMO | Admitting: Hematology

## 2020-05-08 ENCOUNTER — Other Ambulatory Visit: Payer: Self-pay | Admitting: Family Medicine

## 2020-05-22 ENCOUNTER — Other Ambulatory Visit: Payer: Self-pay | Admitting: Family Medicine

## 2020-05-22 DIAGNOSIS — D509 Iron deficiency anemia, unspecified: Secondary | ICD-10-CM

## 2020-06-15 ENCOUNTER — Other Ambulatory Visit: Payer: Self-pay

## 2020-06-15 ENCOUNTER — Encounter: Payer: Self-pay | Admitting: Family Medicine

## 2020-06-15 ENCOUNTER — Ambulatory Visit (INDEPENDENT_AMBULATORY_CARE_PROVIDER_SITE_OTHER): Payer: Medicare HMO | Admitting: Family Medicine

## 2020-06-15 VITALS — BP 131/79 | HR 84 | Temp 98.7°F | Ht 67.5 in | Wt 176.0 lb

## 2020-06-15 DIAGNOSIS — R21 Rash and other nonspecific skin eruption: Secondary | ICD-10-CM | POA: Diagnosis not present

## 2020-06-15 DIAGNOSIS — K50019 Crohn's disease of small intestine with unspecified complications: Secondary | ICD-10-CM

## 2020-06-15 DIAGNOSIS — Z1159 Encounter for screening for other viral diseases: Secondary | ICD-10-CM

## 2020-06-15 DIAGNOSIS — R7301 Impaired fasting glucose: Secondary | ICD-10-CM

## 2020-06-15 DIAGNOSIS — K5080 Crohn's disease of both small and large intestine without complications: Secondary | ICD-10-CM

## 2020-06-15 DIAGNOSIS — L309 Dermatitis, unspecified: Secondary | ICD-10-CM | POA: Diagnosis not present

## 2020-06-15 DIAGNOSIS — Z1231 Encounter for screening mammogram for malignant neoplasm of breast: Secondary | ICD-10-CM

## 2020-06-15 DIAGNOSIS — J3089 Other allergic rhinitis: Secondary | ICD-10-CM

## 2020-06-15 MED ORDER — BETAMETHASONE DIPROPIONATE 0.05 % EX CREA: TOPICAL_CREAM | Freq: Two times a day (BID) | CUTANEOUS | 0 refills | Status: DC

## 2020-06-15 MED ORDER — BETAMETHASONE DIPROPIONATE 0.05 % EX CREA: TOPICAL_CREAM | Freq: Two times a day (BID) | CUTANEOUS | 1 refills | Status: DC

## 2020-06-15 NOTE — Assessment & Plan Note (Signed)
No significant flare currently

## 2020-06-15 NOTE — Assessment & Plan Note (Signed)
1 month history, hyperpigmented macular rash on right forearm and arm invollving anterior elbow.'Topical potent steroid and derm referral

## 2020-06-15 NOTE — Progress Notes (Signed)
   [name]     MRN: [id number]      DOB: 14-Dec-1970   HPI Ms. Degroote is here for follow up and re-evaluation of chronic medical conditions, medication management and review of any available recent lab and radiology data.  Preventive health is updated, specifically  Cancer screening and Immunization.   Questions or concerns regarding consultations or procedures which the PT has had in the interim are  addressed. The PT denies any adverse reactions to current medications since the last visit.  Dark rash on right arm and forearm spreading over the last 4 to 6 weeks, no purulent drainage, no fever, has h/o eczema Still exercising and watching diet with good weight loss   ROS Denies recent fever or chills. Denies sinus pressure, nasal congestion, ear pain or sore throat. Denies chest congestion, productive cough or wheezing. Denies chest pains, palpitations and leg swelling Denies abdominal pain, nausea, vomiting,diarrhea or constipation.   Denies dysuria, frequency, hesitancy or incontinence. Denies joint pain, swelling and limitation in mobility. Denies headaches, seizures, numbness, or tingling. Denies depression, anxiety or insomnia.    PE  BP 131/79 (BP Location: Left Arm, Patient Position: Sitting, Cuff Size: Normal)   Pulse 84   Temp 98.7 F (37.1 C) (Oral)   Ht 5' 7.5" (1.715 m)   Wt 176 lb (79.8 kg)   LMP 06/01/2020   SpO2 98%   BMI 27.16 kg/m   Patient alert and oriented and in no cardiopulmonary distress.  HEENT: No facial asymmetry, EOMI,     Neck supple .  Chest: Clear to auscultation bilaterally.  CVS: S1, S2 no murmurs, no S3.Regular rate.  ABD: Soft non tender.   Ext: No edema  MS: Adequate ROM spine, shoulders, hips and knees.  Skin: Intact, no ulcerations or rash noted.  Psych: Good eye contact, normal affect. Memory intact not anxious or depressed appearing.  CNS: CN 2-12 intact, power,  normal throughout.no focal deficits  noted.   Assessment & Plan  Rash and nonspecific skin eruption 1 month history, hyperpigmented macular rash on right forearm and arm invollving anterior elbow.'Topical potent steroid and derm referral  Crohn's disease of both small and large intestine (HCC) Currently stable and managed by gI  Allergic rhinitis No significant flare currently

## 2020-06-15 NOTE — Patient Instructions (Addendum)
Annual exam with Pap October 12 or after, call if you need me sooner  Mammogram to be scheduled at [facility] at checkout , on Monday  Medication sent for rash  You a re referred to Dermatology the office willcall with appointment  Lab today, hep C screen, chem 7 and eGFR  It is important that you exercise regularly at least 30 minutes 5 times a week. If you develop chest pain, have severe difficulty breathing, or feel very tired, stop exercising immediately and seek medical attention   Think about what you will eat, plan ahead. Choose " clean, green, fresh or frozen" over canned, processed or packaged foods which are more sugary, salty and fatty. 70 to 75% of food eaten should be vegetables and fruit. Three meals at set times with snacks allowed between meals, but they must be fruit or vegetables. Aim to eat over a 12 hour period , example 7 am to 7 pm, and STOP after  your last meal of the day. Drink water,generally about 64 ounces per day, no other drink is as healthy. Fruit juice is best enjoyed in a healthy way, by EATING the fruit. Thanks for choosing [facility], we consider it a privelige to serve you.

## 2020-06-15 NOTE — Assessment & Plan Note (Signed)
Currently stable and managed by gI

## 2020-06-16 LAB — BASIC METABOLIC PANEL
BUN/Creatinine Ratio: 17 (ref 9–23)
BUN: 12 mg/dL (ref 6–24)
CO2: 25 mmol/L (ref 20–29)
Calcium: 9.3 mg/dL (ref 8.7–10.2)
Chloride: 102 mmol/L (ref 96–106)
Creatinine, Ser: 0.7 mg/dL (ref 0.57–1.00)
Glucose: 75 mg/dL (ref 65–99)
Potassium: 4.7 mmol/L (ref 3.5–5.2)
Sodium: 140 mmol/L (ref 134–144)
eGFR: 105 mL/min/{1.73_m2} (ref >59)

## 2020-06-16 LAB — HEPATITIS C ANTIBODY: Hep C Virus Ab: <0.1 s/co ratio (ref 0.0–0.9)

## 2020-07-29 ENCOUNTER — Ambulatory Visit: Payer: Medicare HMO | Admitting: Dermatology

## 2020-08-05 ENCOUNTER — Ambulatory Visit: Payer: Medicare HMO | Admitting: Dermatology

## 2020-08-19 ENCOUNTER — Inpatient Hospital Stay (HOSPITAL_COMMUNITY): Payer: Medicare HMO

## 2020-08-19 ENCOUNTER — Ambulatory Visit (INDEPENDENT_AMBULATORY_CARE_PROVIDER_SITE_OTHER): Payer: Medicare HMO

## 2020-08-19 ENCOUNTER — Other Ambulatory Visit: Payer: Self-pay

## 2020-08-19 DIAGNOSIS — Z0000 Encounter for general adult medical examination without abnormal findings: Secondary | ICD-10-CM
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
## 2020-08-19 NOTE — Patient Instructions (Signed)
Ms. [name] , Thank you for taking time to come for your Medicare Wellness Visit. I appreciate your ongoing commitment to your health goals. Please review the following plan we discussed and let me know if I can assist you in the future.   Screening recommendations/referrals: Colonoscopy: Up to date, next due 03/24/2027 Mammogram: Up to date, next due 09/10/2020 Bone Density: Not due until age 51 Recommended yearly ophthalmology/optometry visit for glaucoma screening and checkup Recommended yearly dental visit for hygiene and checkup  Vaccinations: Influenza vaccine: Up to date, next due fall 2022 Pneumococcal vaccine: Up to date, next due age 88 Tdap vaccine: Currently due, you may receive at your local pharmacy  Shingles vaccine: Currently due, if you would like to receive we recommend that you do so at the pharmacy as it is less expensive     Advanced directives: Advance directive discussed with you today. Even though you declined this today please call our office should you change your mind and we can give you the proper paperwork for you to fill out.   Conditions/risks identified: None   Next appointment: 08/25/2021 @ 3:40 PM with Nurse Health Advisor via telephone   Preventive Care 40-64 Years, Female Preventive care refers to lifestyle choices and visits with your health care provider that can promote health and wellness. What does preventive care include? A yearly physical exam. This is also called an annual well check. Dental exams once or twice a year. Routine eye exams. Ask your health care provider how often you should have your eyes checked. Personal lifestyle choices, including: Daily care of your teeth and gums. Regular physical activity. Eating a healthy diet. Avoiding tobacco and drug use. Limiting alcohol use. Practicing safe sex. Taking low-dose aspirin daily starting at age 26. Taking vitamin and mineral supplements as recommended by your health care  provider. What happens during an annual well check? The services and screenings done by your health care provider during your annual well check will depend on your age, overall health, lifestyle risk factors, and family history of disease. Counseling  Your health care provider may ask you questions about your: Alcohol use. Tobacco use. Drug use. Emotional well-being. Home and relationship well-being. Sexual activity. Eating habits. Work and work Statistician. Method of birth control. Menstrual cycle. Pregnancy history. Screening  You may have the following tests or measurements: Height, weight, and BMI. Blood pressure. Lipid and cholesterol levels. These may be checked every 5 years, or more frequently if you are over 51 years old. Skin check. Lung cancer screening. You may have this screening every year starting at age 41 if you have a 30-pack-year history of smoking and currently smoke or have quit within the past 15 years. Fecal occult blood test (FOBT) of the stool. You may have this test every year starting at age 72. Flexible sigmoidoscopy or colonoscopy. You may have a sigmoidoscopy every 5 years or a colonoscopy every 10 years starting at age 36. Hepatitis C blood test. Hepatitis B blood test. Sexually transmitted disease (STD) testing. Diabetes screening. This is done by checking your blood sugar (glucose) after you have not eaten for a while (fasting). You may have this done every 1-3 years. Mammogram. This may be done every 1-2 years. Talk to your health care provider about when you should start having regular mammograms. This may depend on whether you have a family history of breast cancer. BRCA-related cancer screening. This may be done if you have a family history of breast, ovarian, tubal, or  peritoneal cancers. Pelvic exam and Pap test. This may be done every 3 years starting at age 86. Starting at age 51, this may be done every 5 years if you have a Pap test in  combination with an HPV test. Bone density scan. This is done to screen for osteoporosis. You may have this scan if you are at high risk for osteoporosis. Discuss your test results, treatment options, and if necessary, the need for more tests with your health care provider. Vaccines  Your health care provider may recommend certain vaccines, such as: Influenza vaccine. This is recommended every year. Tetanus, diphtheria, and acellular pertussis (Tdap, Td) vaccine. You may need a Td booster every 10 years. Zoster vaccine. You may need this after age 99. Pneumococcal 13-valent conjugate (PCV13) vaccine. You may need this if you have certain conditions and were not previously vaccinated. Pneumococcal polysaccharide (PPSV23) vaccine. You may need one or two doses if you smoke cigarettes or if you have certain conditions. Talk to your health care provider about which screenings and vaccines you need and how often you need them. This information is not intended to replace advice given to you by your health care provider. Make sure you discuss any questions you have with your health care provider. Document Released: 03/20/2015 Document Revised: 11/11/2015 Document Reviewed: 12/23/2014 Elsevier Interactive Patient Education  2017 [place] Prevention in the Home Falls can cause injuries. They can happen to people of all ages. There are many things you can do to make your home safe and to help prevent falls. What can I do on the outside of my home? Regularly fix the edges of walkways and driveways and fix any cracks. Remove anything that might make you trip as you walk through a door, such as a raised step or threshold. Trim any bushes or trees on the path to your home. Use bright outdoor lighting. Clear any walking paths of anything that might make someone trip, such as rocks or tools. Regularly check to see if handrails are loose or broken. Make sure that both sides of any steps have  handrails. Any raised decks and porches should have guardrails on the edges. Have any leaves, snow, or ice cleared regularly. Use sand or salt on walking paths during winter. Clean up any spills in your garage right away. This includes oil or grease spills. What can I do in the bathroom? Use night lights. Install grab bars by the toilet and in the tub and shower. Do not use towel bars as grab bars. Use non-skid mats or decals in the tub or shower. If you need to sit down in the shower, use a plastic, non-slip stool. Keep the floor dry. Clean up any water that spills on the floor as soon as it happens. Remove soap buildup in the tub or shower regularly. Attach bath mats securely with double-sided non-slip rug tape. Do not have throw rugs and other things on the floor that can make you trip. What can I do in the bedroom? Use night lights. Make sure that you have a light by your bed that is easy to reach. Do not use any sheets or blankets that are too big for your bed. They should not hang down onto the floor. Have a firm chair that has side arms. You can use this for support while you get dressed. Do not have throw rugs and other things on the floor that can make you trip. What can I do in  the kitchen? Clean up any spills right away. Avoid walking on wet floors. Keep items that you use a lot in easy-to-reach places. If you need to reach something above you, use a strong step stool that has a grab bar. Keep electrical cords out of the way. Do not use floor polish or wax that makes floors slippery. If you must use wax, use non-skid floor wax. Do not have throw rugs and other things on the floor that can make you trip. What can I do with my stairs? Do not leave any items on the stairs. Make sure that there are handrails on both sides of the stairs and use them. Fix handrails that are broken or loose. Make sure that handrails are as long as the stairways. Check any carpeting to make sure  that it is firmly attached to the stairs. Fix any carpet that is loose or worn. Avoid having throw rugs at the top or bottom of the stairs. If you do have throw rugs, attach them to the floor with carpet tape. Make sure that you have a light switch at the top of the stairs and the bottom of the stairs. If you do not have them, ask someone to add them for you. What else can I do to help prevent falls? Wear shoes that: Do not have high heels. Have rubber bottoms. Are comfortable and fit you well. Are closed at the toe. Do not wear sandals. If you use a stepladder: Make sure that it is fully opened. Do not climb a closed stepladder. Make sure that both sides of the stepladder are locked into place. Ask someone to hold it for you, if possible. Clearly mark and make sure that you can see: Any grab bars or handrails. First and last steps. Where the edge of each step is. Use tools that help you move around (mobility aids) if they are needed. These include: Canes. Walkers. Scooters. Crutches. Turn on the lights when you go into a dark area. Replace any light bulbs as soon as they burn out. Set up your furniture so you have a clear path. Avoid moving your furniture around. If any of your floors are uneven, fix them. If there are any pets around you, be aware of where they are. Review your medicines with your doctor. Some medicines can make you feel dizzy. This can increase your chance of falling. Ask your doctor what other things that you can do to help prevent falls. This information is not intended to replace advice given to you by your health care provider. Make sure you discuss any questions you have with your health care provider. Document Released: 12/18/2008 Document Revised: 07/30/2015 Document Reviewed: 03/28/2014 Elsevier Interactive Patient Education  2017 [employer].

## 2020-08-20 ENCOUNTER — Telehealth (HOSPITAL_COMMUNITY): Payer: Medicare HMO | Admitting: Hematology

## 2020-08-25 ENCOUNTER — Other Ambulatory Visit: Payer: Self-pay | Admitting: Family Medicine

## 2020-08-25 MED ORDER — ACYCLOVIR 400 MG PO TABS: 400.0000 mg | ORAL_TABLET | Freq: Three times a day (TID) | ORAL | 0 refills | Status: DC

## 2020-08-28 ENCOUNTER — Other Ambulatory Visit (HOSPITAL_COMMUNITY): Payer: Self-pay

## 2020-08-28 DIAGNOSIS — D508 Other iron deficiency anemias: Secondary | ICD-10-CM

## 2020-08-28 DIAGNOSIS — D75839 Thrombocytosis, unspecified: Secondary | ICD-10-CM

## 2020-08-31 ENCOUNTER — Inpatient Hospital Stay (HOSPITAL_COMMUNITY): Payer: Medicare HMO | Attending: Hematology

## 2020-08-31 ENCOUNTER — Other Ambulatory Visit: Payer: Self-pay

## 2020-08-31 DIAGNOSIS — K219 Gastro-esophageal reflux disease without esophagitis: Secondary | ICD-10-CM | POA: Diagnosis not present

## 2020-08-31 DIAGNOSIS — Z818 Family history of other mental and behavioral disorders: Secondary | ICD-10-CM | POA: Diagnosis not present

## 2020-08-31 DIAGNOSIS — Z836 Family history of other diseases of the respiratory system: Secondary | ICD-10-CM | POA: Diagnosis not present

## 2020-08-31 DIAGNOSIS — Z87891 Personal history of nicotine dependence: Secondary | ICD-10-CM | POA: Diagnosis not present

## 2020-08-31 DIAGNOSIS — Z79899 Other long term (current) drug therapy: Secondary | ICD-10-CM | POA: Diagnosis not present

## 2020-08-31 DIAGNOSIS — D508 Other iron deficiency anemias: Secondary | ICD-10-CM

## 2020-08-31 DIAGNOSIS — R5383 Other fatigue: Secondary | ICD-10-CM | POA: Insufficient documentation

## 2020-08-31 DIAGNOSIS — D72829 Elevated white blood cell count, unspecified: Secondary | ICD-10-CM | POA: Insufficient documentation

## 2020-08-31 DIAGNOSIS — D509 Iron deficiency anemia, unspecified: Secondary | ICD-10-CM | POA: Insufficient documentation

## 2020-08-31 DIAGNOSIS — K5080 Crohn's disease of both small and large intestine without complications: Secondary | ICD-10-CM | POA: Diagnosis not present

## 2020-08-31 DIAGNOSIS — D75839 Thrombocytosis, unspecified: Secondary | ICD-10-CM

## 2020-08-31 DIAGNOSIS — E559 Vitamin D deficiency, unspecified: Secondary | ICD-10-CM | POA: Diagnosis not present

## 2020-08-31 DIAGNOSIS — R69 Illness, unspecified: Secondary | ICD-10-CM | POA: Diagnosis not present

## 2020-08-31 DIAGNOSIS — Z814 Family history of other substance abuse and dependence: Secondary | ICD-10-CM | POA: Diagnosis not present

## 2020-08-31 LAB — COMPREHENSIVE METABOLIC PANEL
ALT: 20 U/L (ref 0–44)
AST: 17 U/L (ref 15–41)
Albumin: 3.2 g/dL — ABNORMAL LOW (ref 3.5–5.0)
Alkaline Phosphatase: 71 U/L (ref 38–126)
Anion gap: 5 (ref 5–15)
BUN: 10 mg/dL (ref 6–20)
CO2: 26 mmol/L (ref 22–32)
Calcium: 8.5 mg/dL — ABNORMAL LOW (ref 8.9–10.3)
Chloride: 103 mmol/L (ref 98–111)
Creatinine, Ser: 0.56 mg/dL (ref 0.44–1.00)
GFR, Estimated: >60 mL/min (ref >60)
Glucose, Bld: 85 mg/dL (ref 70–99)
Potassium: 4.4 mmol/L (ref 3.5–5.1)
Sodium: 134 mmol/L — ABNORMAL LOW (ref 135–145)
Total Bilirubin: 0.2 mg/dL — ABNORMAL LOW (ref 0.3–1.2)
Total Protein: 7 g/dL (ref 6.5–8.1)

## 2020-08-31 LAB — CBC WITH DIFFERENTIAL/PLATELET
Abs Immature Granulocytes: 0.03 10*3/uL (ref 0.00–0.07)
Basophils Absolute: 0.1 10*3/uL (ref 0.0–0.1)
Basophils Relative: 1 %
Eosinophils Absolute: 0.5 10*3/uL (ref 0.0–0.5)
Eosinophils Relative: 4 %
HCT: 38.6 % (ref 36.0–46.0)
Hemoglobin: 12.3 g/dL (ref 12.0–15.0)
Immature Granulocytes: 0 %
Lymphocytes Relative: 34 %
Lymphs Abs: 3.8 10*3/uL (ref 0.7–4.0)
MCH: 27.7 pg (ref 26.0–34.0)
MCHC: 31.9 g/dL (ref 30.0–36.0)
MCV: 86.9 fL (ref 80.0–100.0)
Monocytes Absolute: 0.7 10*3/uL (ref 0.1–1.0)
Monocytes Relative: 6 %
Neutro Abs: 6 10*3/uL (ref 1.7–7.7)
Neutrophils Relative %: 55 %
Platelets: 368 10*3/uL (ref 150–400)
RBC: 4.44 MIL/uL (ref 3.87–5.11)
RDW: 15.5 % (ref 11.5–15.5)
WBC: 11.1 10*3/uL — ABNORMAL HIGH (ref 4.0–10.5)
nRBC: 0 % (ref 0.0–0.2)

## 2020-08-31 LAB — IRON AND TIBC
Iron: 98 ug/dL (ref 28–170)
Saturation Ratios: 30 % (ref 10.4–31.8)
TIBC: 327 ug/dL (ref 250–450)
UIBC: 229 ug/dL

## 2020-08-31 LAB — VITAMIN B12: Vitamin B-12: 543 pg/mL (ref 180–914)

## 2020-08-31 LAB — LACTATE DEHYDROGENASE: LDH: 117 U/L (ref 98–192)

## 2020-08-31 LAB — FERRITIN: Ferritin: 172 ng/mL (ref 11–307)

## 2020-08-31 LAB — VITAMIN D 25 HYDROXY (VIT D DEFICIENCY, FRACTURES): Vit D, 25-Hydroxy: 56.45 ng/mL (ref 30–100)

## 2020-08-31 LAB — FOLATE: Folate: 36.5 ng/mL (ref >5.9)

## 2020-09-02 ENCOUNTER — Encounter (HOSPITAL_COMMUNITY): Payer: Self-pay

## 2020-09-02 ENCOUNTER — Inpatient Hospital Stay (HOSPITAL_BASED_OUTPATIENT_CLINIC_OR_DEPARTMENT_OTHER): Payer: Medicare HMO | Admitting: Physician Assistant

## 2020-09-02 ENCOUNTER — Other Ambulatory Visit: Payer: Self-pay

## 2020-09-02 ENCOUNTER — Encounter (HOSPITAL_COMMUNITY): Payer: Self-pay | Admitting: Physician Assistant

## 2020-09-02 ENCOUNTER — Telehealth (HOSPITAL_COMMUNITY): Payer: Medicare HMO | Admitting: Hematology and Oncology

## 2020-09-02 DIAGNOSIS — D508 Other iron deficiency anemias: Secondary | ICD-10-CM | POA: Diagnosis not present

## 2020-09-02 DIAGNOSIS — E559 Vitamin D deficiency, unspecified: Secondary | ICD-10-CM

## 2020-09-02 NOTE — Progress Notes (Signed)
Virtual Visit via Telephone Note [facility]  I connected with [name] on 09/02/20  at 4:18 PM by telephone and verified that I am speaking with the correct person using two identifiers.  Location: Patient: Home Provider: [facility]   I discussed the limitations, risks, security and privacy concerns of performing an evaluation and management service by telephone and the availability of in person appointments. I also discussed with the patient that there may be a patient responsible charge related to this service. The patient expressed understanding and agreed to proceed.   History of Present Illness: [name] is contacted today for follow-up of her iron deficiency anemia and leukocytosis.  She was last evaluated by NP Faythe Casa via telephone visit on 04/21/2020.  [name] reports that she is overall feeling fair.  Her menstrual cycles are lighter than they used to be, and she denies other source of blood loss.  No epistaxis, hematemesis, hemoptysis, hematochezia, melena, or hematuria.  She continues to take iron pill (Niferex) at home.  She does complain of some chronic intermittent fatigue, reports that her energy levels vary from day-to-day.  She has been having some numbness and tingling in her hands when she wakes up in the morning.  She has had been having "charley horses" and leg cramps that wake her up from sleep.  She reports that her appetite is good, she is not losing any weight unintentionally.    Observations/Objective: Review of Systems  Constitutional:  Positive for malaise/fatigue. Negative for chills, diaphoresis, fever and weight loss.  Respiratory:  Negative for cough and shortness of breath.   Cardiovascular:  Negative for chest pain and palpitations.  Gastrointestinal:  Negative for abdominal pain, blood in stool, melena, nausea and vomiting.  Neurological:  Positive for tingling. Negative for dizziness and headaches.     PHYSICAL EXAM (per limitations of virtual telephone visit): The patient is alert and oriented x 3, exhibiting adequate mentation, good mood, and ability to speak in full sentences and execute sound judgement.   ASSESSMENT & PLAN: 1.  Iron deficiency anemia: -Iron deficiency anemia secondary to menstrual blood loss; patient has a history of menorrhagia, but this has improved recently - Last Venofer was on 09/11/2019. - She has been taking oral iron supplement (Niferex) at home with improvement in her iron levels - Labs reviewed from 08/31/2020: Hgb 12.3 with MCV 86.9; ferritin 172 with 30% iron saturation; normal B12 and folate - PLAN: Continue oral iron supplement.  No parenteral iron therapy needed at this time.  RTC in 6 months with labs the week before.   2.  Vitamin D deficiency: -Vitamin D level is 56.45 - PLAN: Continue vitamin D 50,000 units weekly.   3.  Leukocytosis: - She has had intermittent leukocytosis associated with steroid use and previous infections - Most recent WBC (08/31/2020) mildly elevated at 11.1 with normal differential - No recent steroid use.  Former smoker, quit in 2013.  No known history of rheumatologic or autoimmune diseases.  No B symptoms or unintentional weight loss. - PLAN: Repeat CBC at follow-up visit in 6 months.  We will consider further work-up if any significant deviation from baseline.   Follow Up Instructions: -Labs and RTC in 6 months.    I discussed the assessment and treatment plan with the patient. The patient was provided an opportunity to ask questions and all were answered. The patient agreed with the plan and demonstrated an understanding of the instructions.  The patient was advised to call back or seek an in-person evaluation if the symptoms worsen or if the condition fails to improve as anticipated.  I provided 8 minutes of non-face-to-face time during this encounter.   Harriett Rush, PA-C 09/02/20 5:27 PM

## 2020-09-24 ENCOUNTER — Other Ambulatory Visit (HOSPITAL_COMMUNITY): Payer: Self-pay | Admitting: Family Medicine

## 2020-09-24 DIAGNOSIS — Z1231 Encounter for screening mammogram for malignant neoplasm of breast: Secondary | ICD-10-CM

## 2020-09-28 ENCOUNTER — Encounter: Payer: Self-pay | Admitting: Gastroenterology

## 2020-09-28 ENCOUNTER — Ambulatory Visit (HOSPITAL_COMMUNITY)
Admission: RE | Admit: 2020-09-28 | Discharge: 2020-09-28 | Disposition: A | Discharge status: Home / Self Care | Payer: Medicare HMO | Source: Ambulatory Visit | Attending: Family Medicine | Admitting: Family Medicine

## 2020-09-28 ENCOUNTER — Ambulatory Visit (INDEPENDENT_AMBULATORY_CARE_PROVIDER_SITE_OTHER): Payer: Medicare HMO | Admitting: Gastroenterology

## 2020-09-28 ENCOUNTER — Other Ambulatory Visit: Payer: Self-pay

## 2020-09-28 VITALS — BP 123/77 | HR 90 | Temp 97.3°F | Ht 68.0 in | Wt 180.2 lb

## 2020-09-28 DIAGNOSIS — K219 Gastro-esophageal reflux disease without esophagitis: Secondary | ICD-10-CM | POA: Diagnosis not present

## 2020-09-28 DIAGNOSIS — K50819 Crohn's disease of both small and large intestine with unspecified complications: Secondary | ICD-10-CM

## 2020-09-28 DIAGNOSIS — Z1231 Encounter for screening mammogram for malignant neoplasm of breast: Secondary | ICD-10-CM | POA: Diagnosis not present
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
## 2020-09-28 NOTE — Patient Instructions (Signed)
Continue omeprazole and Humira as before.  We will be in touch with timing of your next colonoscopy.  Return to the office in six months.

## 2020-10-16 ENCOUNTER — Telehealth: Payer: Self-pay | Admitting: Gastroenterology

## 2020-10-16 NOTE — Telephone Encounter (Signed)
Called pt and made her aware.

## 2020-10-16 NOTE — Telephone Encounter (Signed)
Please let pt know that I discussed with Dr. Gala Romney regarding next colonoscopy. He recommended doing colonoscopy in six months. Let's plan on setting up after her next office visit.   For our information, Will need to use pediatric scope per Dr. Gala Romney to try and get inside neoterminal ileum.

## 2020-11-19 IMAGING — MG DIGITAL SCREENING BILAT W/ TOMO W/ CAD
6 of 10 series · 6 of 30 positions shown · non-contrast
Comparison: Previous exam(s).

CLINICAL DATA: Screening.

EXAM:
DIGITAL SCREENING BILATERAL MAMMOGRAM WITH TOMO AND CAD

[L CC synth-2D (1 of 2)]
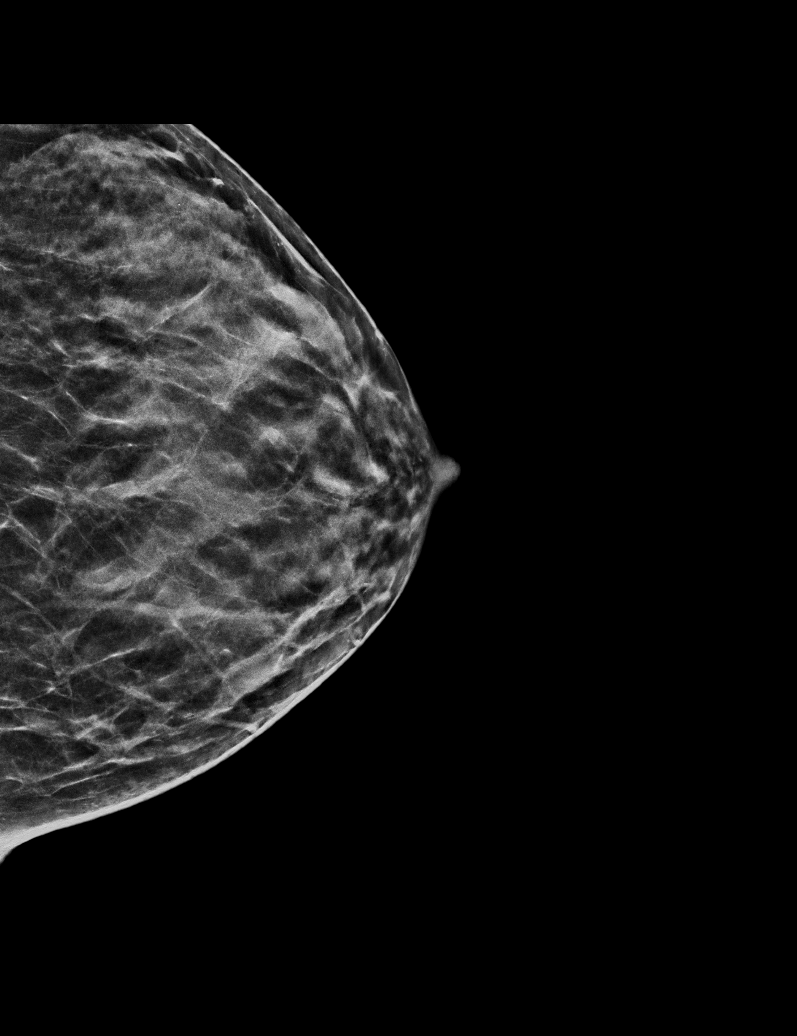

[R MLO synth-2D]
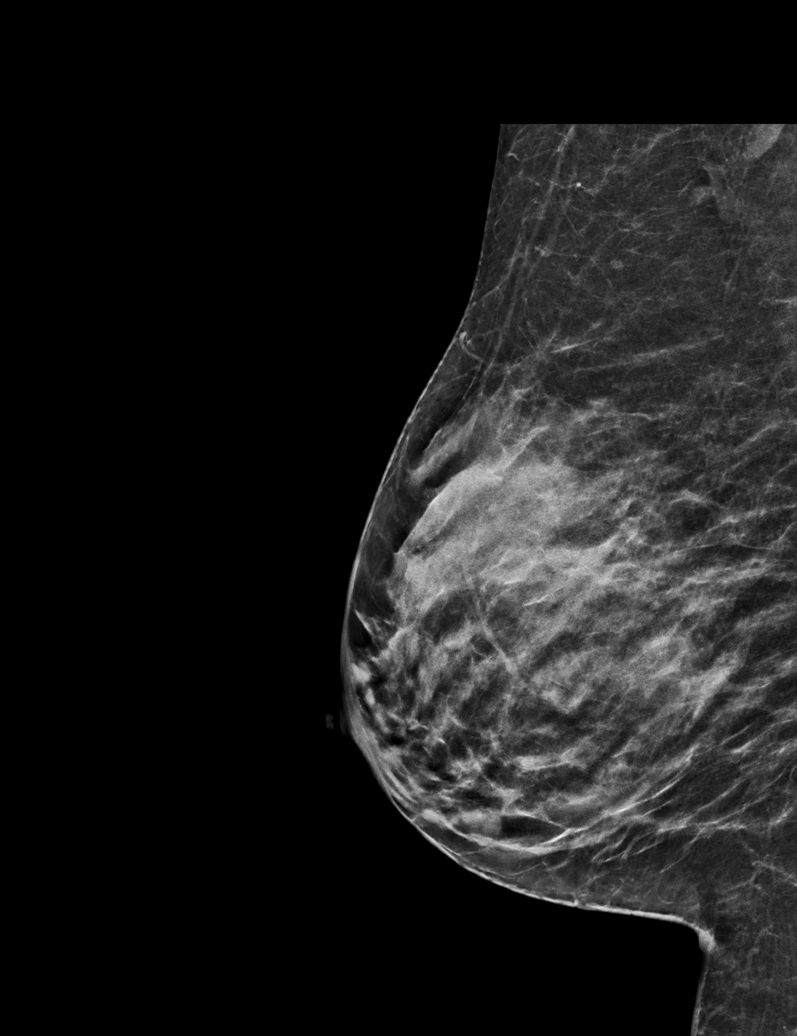

[L MLO synth-2D]
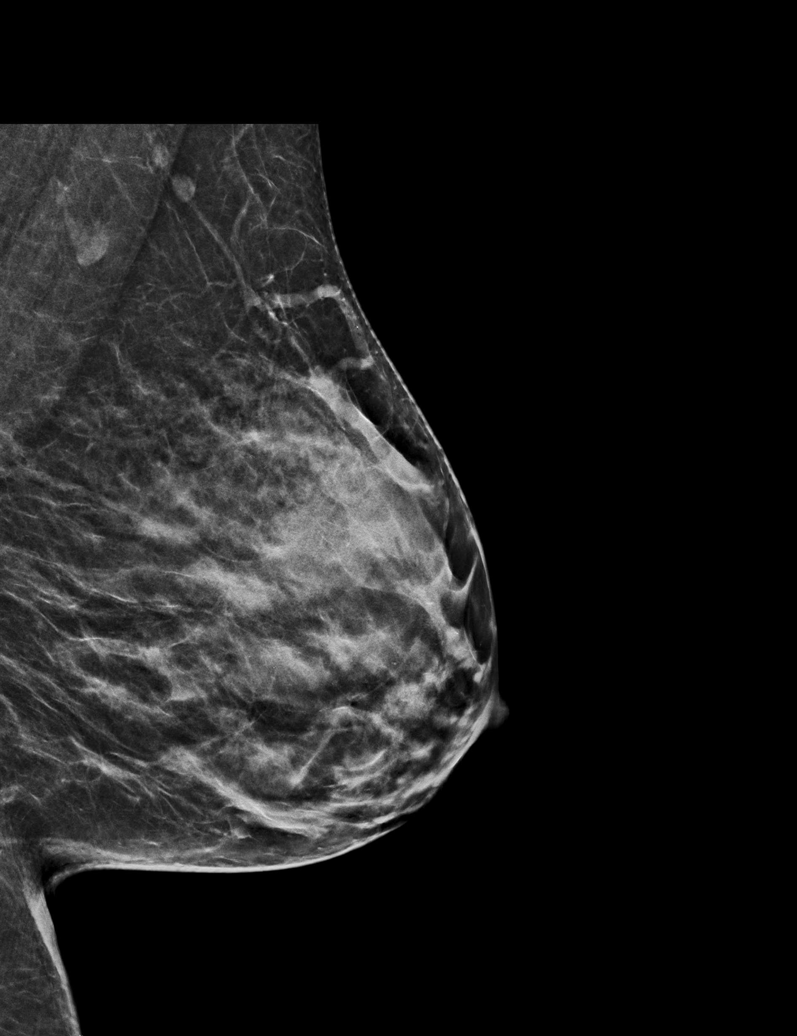

[R CC synth-2D]
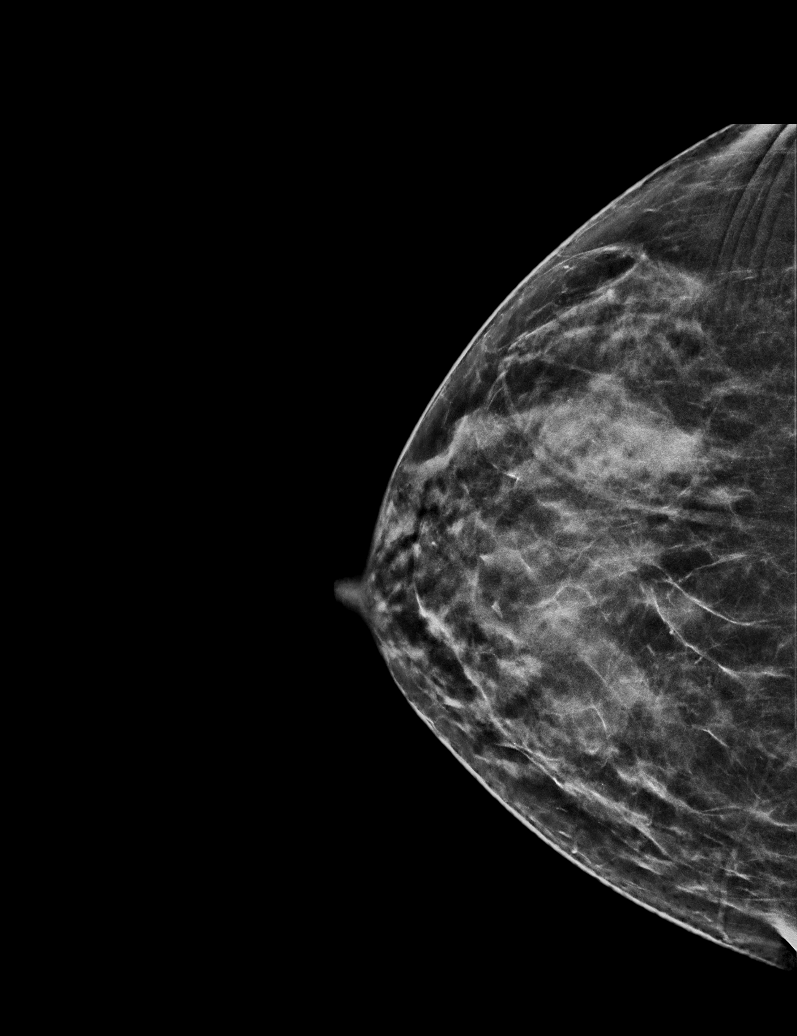

[L CC synth-2D (2 of 2)]
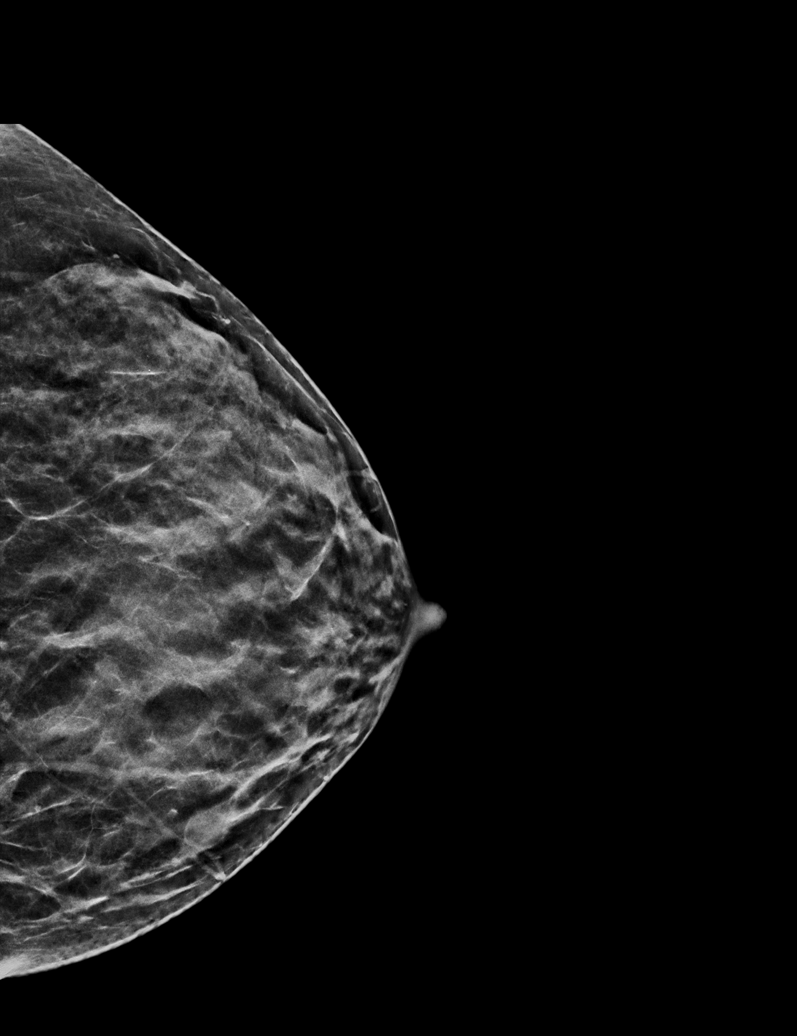

[L CC tomo · tomo slice 23/44.0]
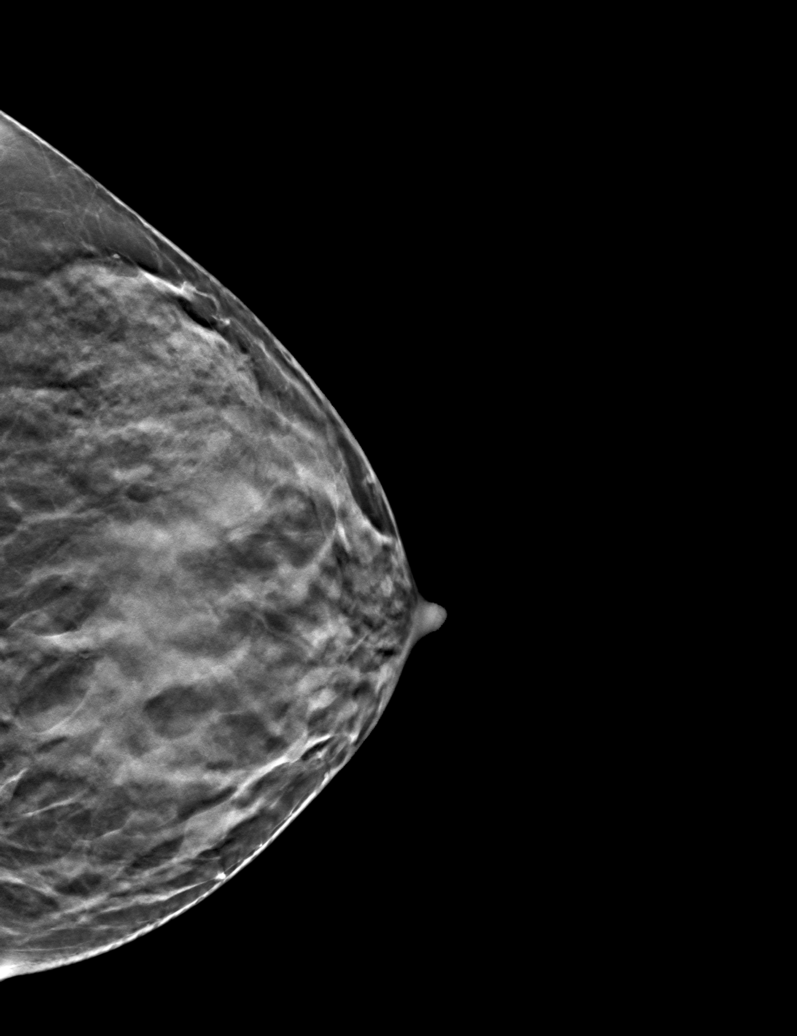

[6 of 30 positions shown; findings below may reference images not displayed]

ACR Breast Density Category c: The breast tissue is heterogeneously
dense, which may obscure small masses.
FINDINGS: In the right breast, a possible mass warrants further evaluation.
This possible mass is seen within the outer RIGHT breast, anterior
depth, cc view only, slice 28.

In the left breast, no findings suspicious for malignancy. Images
were processed with CAD.
IMPRESSION: Further evaluation is suggested for possible mass in the right
breast.

RECOMMENDATION:
Diagnostic mammogram and possibly ultrasound of the right breast.
(Code:[id number]-[id number])

The patient will be contacted regarding the findings, and additional
imaging will be scheduled.

BI-RADS CATEGORY  0: Incomplete. Need additional imaging evaluation
and/or prior mammograms for comparison.

## 2020-11-20 ENCOUNTER — Other Ambulatory Visit: Payer: Self-pay | Admitting: Family Medicine

## 2020-11-30 ENCOUNTER — Other Ambulatory Visit (HOSPITAL_COMMUNITY): Payer: Self-pay | Admitting: *Deleted

## 2020-11-30 DIAGNOSIS — D508 Other iron deficiency anemias: Secondary | ICD-10-CM

## 2020-11-30 MED ORDER — ERGOCALCIFEROL 1.25 MG (50000 UT) PO CAPS: 50000.0000 [IU] | ORAL_CAPSULE | ORAL | 6 refills | Status: DC

## 2020-12-17 ENCOUNTER — Other Ambulatory Visit: Payer: Self-pay | Admitting: Family Medicine

## 2020-12-21 ENCOUNTER — Encounter: Payer: Medicare HMO | Admitting: Family Medicine

## 2021-01-01 ENCOUNTER — Ambulatory Visit (INDEPENDENT_AMBULATORY_CARE_PROVIDER_SITE_OTHER): Payer: Medicare HMO | Admitting: Family Medicine

## 2021-01-01 ENCOUNTER — Other Ambulatory Visit: Payer: Self-pay

## 2021-01-01 ENCOUNTER — Other Ambulatory Visit (HOSPITAL_COMMUNITY)
Admission: RE | Admit: 2021-01-01 | Discharge: 2021-01-01 | Disposition: A | Discharge status: Home / Self Care | Payer: Medicare HMO | Source: Ambulatory Visit | Attending: Family Medicine | Admitting: Family Medicine

## 2021-01-01 VITALS — BP 114/73 | HR 79 | Resp 16 | Ht 68.0 in | Wt 180.0 lb

## 2021-01-01 DIAGNOSIS — Z13220 Encounter for screening for lipoid disorders: Secondary | ICD-10-CM

## 2021-01-01 DIAGNOSIS — Z01419 Encounter for gynecological examination (general) (routine) without abnormal findings: Secondary | ICD-10-CM | POA: Diagnosis present

## 2021-01-01 DIAGNOSIS — D508 Other iron deficiency anemias: Secondary | ICD-10-CM

## 2021-01-01 DIAGNOSIS — Z124 Encounter for screening for malignant neoplasm of cervix: Secondary | ICD-10-CM

## 2021-01-01 DIAGNOSIS — Z0000 Encounter for general adult medical examination without abnormal findings: Secondary | ICD-10-CM | POA: Diagnosis not present

## 2021-01-01 DIAGNOSIS — Z23 Encounter for immunization: Secondary | ICD-10-CM

## 2021-01-01 DIAGNOSIS — Z1151 Encounter for screening for human papillomavirus (HPV): Secondary | ICD-10-CM | POA: Diagnosis not present

## 2021-01-01 DIAGNOSIS — E559 Vitamin D deficiency, unspecified: Secondary | ICD-10-CM | POA: Diagnosis not present

## 2021-01-01 DIAGNOSIS — R69 Illness, unspecified: Secondary | ICD-10-CM | POA: Diagnosis not present

## 2021-01-01 NOTE — Assessment & Plan Note (Signed)

## 2021-01-01 NOTE — Patient Instructions (Addendum)
Annual exam in 366 days, call if you need me sooner  Flu vaccine today  PLS GET COVID VACCINE  Lipid, cmp and EGFr, tSH and vit D today '  It is important that you exercise regularly at least 30 minutes 5 times a week. If you develop chest pain, have severe difficulty breathing, or feel very tired, stop exercising immediately and seek medical attention  Congrats on healthy lifestyle, keep it up  Thanks for choosing [facility], we consider it a privelige to serve you.

## 2021-01-02 LAB — CMP14+EGFR
ALT: 20 IU/L (ref 0–32)
AST: 19 IU/L (ref 0–40)
Albumin/Globulin Ratio: 1.2 (ref 1.2–2.2)
Albumin: 3.7 g/dL — ABNORMAL LOW (ref 3.8–4.9)
Alkaline Phosphatase: 93 IU/L (ref 44–121)
BUN/Creatinine Ratio: 11 (ref 9–23)
BUN: 7 mg/dL (ref 6–24)
Bilirubin Total: <0.2 mg/dL (ref 0.0–1.2)
CO2: 26 mmol/L (ref 20–29)
Calcium: 8.6 mg/dL — ABNORMAL LOW (ref 8.7–10.2)
Chloride: 102 mmol/L (ref 96–106)
Creatinine, Ser: 0.63 mg/dL (ref 0.57–1.00)
Globulin, Total: 3.1 g/dL (ref 1.5–4.5)
Glucose: 71 mg/dL (ref 70–99)
Potassium: 4.2 mmol/L (ref 3.5–5.2)
Sodium: 138 mmol/L (ref 134–144)
Total Protein: 6.8 g/dL (ref 6.0–8.5)
eGFR: 107 mL/min/{1.73_m2} (ref >59)

## 2021-01-02 LAB — LIPID PANEL
Chol/HDL Ratio: 2.3 ratio (ref 0.0–4.4)
Cholesterol, Total: 145 mg/dL (ref 100–199)
HDL: 64 mg/dL (ref >39)
LDL Chol Calc (NIH): 63 mg/dL (ref 0–99)
Triglycerides: 96 mg/dL (ref 0–149)
VLDL Cholesterol Cal: 18 mg/dL (ref 5–40)

## 2021-01-02 LAB — VITAMIN D 25 HYDROXY (VIT D DEFICIENCY, FRACTURES): Vit D, 25-Hydroxy: 32.8 ng/mL (ref 30.0–100.0)

## 2021-01-02 LAB — TSH: TSH: 1.22 u[IU]/mL (ref 0.450–4.500)

## 2021-01-03 ENCOUNTER — Encounter: Payer: Self-pay | Admitting: Family Medicine

## 2021-01-03 NOTE — Progress Notes (Signed)
    [name]     MRN: [id number]      DOB: [birth year]  HPI: Patient is in for annual physical exam. No other health concerns are expressed or addressed at the visit. Recent labs,  are reviewed. Immunization is reviewed , and  updated if needed.   PE: BP 114/73   Pulse 79   Resp 16   Ht 5' 8"  (1.727 m)   Wt 180 lb (81.6 kg)   LMP 12/15/2020 (Approximate)   SpO2 99%   PF 79 L/min   BMI 27.37 kg/m   Pleasant  female, alert and oriented x 3, in no cardio-pulmonary distress. Afebrile. HEENT No facial trauma or asymetry. Sinuses non tender.  Extra occullar muscles intact.. External ears normal, . Neck: supple, no adenopathy,JVD or thyromegaly.No bruits.  Chest: Clear to ascultation bilaterally.No crackles or wheezes. Non tender to palpation  Breast: No asymetry,no masses or lumps. No tenderness. No nipple discharge or inversion. No axillary or supraclavicular adenopathy  Cardiovascular system; Heart sounds normal,  S1 and  S2 ,no S3.  No murmur, or thrill. Apical beat not displaced Peripheral pulses normal.  Abdomen: Soft, non tender, no organomegaly or masses. No bruits. Bowel sounds normal. No guarding, tenderness or rebound.   GU: External genitalia normal female genitalia , normal female distribution of hair. No lesions. Urethral meatus normal in size, no  Prolapse, no lesions visibly  Present. Bladder non tender. Vagina pink and moist , with no visible lesions , discharge present . Adequate pelvic support no  cystocele or rectocele noted Cervix pink and appears healthy, no lesions or ulcerations noted, no discharge noted from os Uterus normal size, no adnexal masses, no cervical motion or adnexal tenderness.   Musculoskeletal exam: Full ROM of spine, hips , shoulders and knees. No deformity ,swelling or crepitus noted. No muscle wasting or atrophy.   Neurologic: Cranial nerves 2 to 12 intact. Power, tone ,sensation and reflexes normal  throughout. No disturbance in gait. No tremor.  Skin: Intact, no ulceration, erythema , scaling or rash noted. Pigmentation normal throughout  Psych; Normal mood and affect. Judgement and concentration normal   Assessment & Plan:  Annual physical exam Annual exam as documented. Counseling done  re healthy lifestyle involving commitment to 150 minutes exercise per week, heart healthy diet, and attaining healthy weight.The importance of adequate sleep also discussed. Regular seat belt use and home safety, is also discussed. Changes in health habits are decided on by the patient with goals and time frames  set for achieving them. Immunization and cancer screening needs are specifically addressed at this visit.

## 2021-01-06 LAB — CYTOLOGY - PAP
Comment: NEGATIVE
Diagnosis: NEGATIVE
High risk HPV: NEGATIVE

## 2021-03-05 ENCOUNTER — Other Ambulatory Visit: Payer: Self-pay | Admitting: Family Medicine

## 2021-03-09 ENCOUNTER — Inpatient Hospital Stay (HOSPITAL_COMMUNITY): Payer: Medicare HMO | Attending: Hematology

## 2021-03-09 DIAGNOSIS — D72829 Elevated white blood cell count, unspecified: Secondary | ICD-10-CM | POA: Insufficient documentation

## 2021-03-09 DIAGNOSIS — E559 Vitamin D deficiency, unspecified: Secondary | ICD-10-CM | POA: Insufficient documentation

## 2021-03-09 DIAGNOSIS — N920 Excessive and frequent menstruation with regular cycle: Secondary | ICD-10-CM | POA: Insufficient documentation

## 2021-03-09 DIAGNOSIS — D75839 Thrombocytosis, unspecified: Secondary | ICD-10-CM | POA: Insufficient documentation

## 2021-03-09 DIAGNOSIS — Z79899 Other long term (current) drug therapy: Secondary | ICD-10-CM | POA: Insufficient documentation

## 2021-03-09 DIAGNOSIS — K5090 Crohn's disease, unspecified, without complications: Secondary | ICD-10-CM | POA: Insufficient documentation

## 2021-03-09 DIAGNOSIS — D500 Iron deficiency anemia secondary to blood loss (chronic): Secondary | ICD-10-CM | POA: Insufficient documentation

## 2021-03-09 DIAGNOSIS — Z87891 Personal history of nicotine dependence: Secondary | ICD-10-CM | POA: Insufficient documentation

## 2021-03-15 ENCOUNTER — Ambulatory Visit (HOSPITAL_COMMUNITY): Payer: Medicare HMO | Admitting: Physician Assistant

## 2021-03-16 ENCOUNTER — Ambulatory Visit (HOSPITAL_COMMUNITY): Payer: Medicare HMO | Admitting: Physician Assistant

## 2021-03-23 ENCOUNTER — Inpatient Hospital Stay (HOSPITAL_COMMUNITY): Payer: Medicare HMO | Attending: Hematology

## 2021-03-23 DIAGNOSIS — K219 Gastro-esophageal reflux disease without esophagitis: Secondary | ICD-10-CM | POA: Diagnosis not present

## 2021-03-23 DIAGNOSIS — K5080 Crohn's disease of both small and large intestine without complications: Secondary | ICD-10-CM | POA: Insufficient documentation

## 2021-03-23 DIAGNOSIS — Z836 Family history of other diseases of the respiratory system: Secondary | ICD-10-CM | POA: Insufficient documentation

## 2021-03-23 DIAGNOSIS — R69 Illness, unspecified: Secondary | ICD-10-CM | POA: Diagnosis not present

## 2021-03-23 DIAGNOSIS — Z87891 Personal history of nicotine dependence: Secondary | ICD-10-CM | POA: Insufficient documentation

## 2021-03-23 DIAGNOSIS — Z814 Family history of other substance abuse and dependence: Secondary | ICD-10-CM | POA: Diagnosis not present

## 2021-03-23 DIAGNOSIS — D72829 Elevated white blood cell count, unspecified: Secondary | ICD-10-CM | POA: Insufficient documentation

## 2021-03-23 DIAGNOSIS — R5383 Other fatigue: Secondary | ICD-10-CM | POA: Insufficient documentation

## 2021-03-23 DIAGNOSIS — E559 Vitamin D deficiency, unspecified: Secondary | ICD-10-CM | POA: Insufficient documentation

## 2021-03-23 DIAGNOSIS — D509 Iron deficiency anemia, unspecified: Secondary | ICD-10-CM | POA: Diagnosis not present

## 2021-03-23 DIAGNOSIS — Z818 Family history of other mental and behavioral disorders: Secondary | ICD-10-CM | POA: Insufficient documentation

## 2021-03-23 DIAGNOSIS — Z79899 Other long term (current) drug therapy: Secondary | ICD-10-CM | POA: Insufficient documentation

## 2021-03-23 DIAGNOSIS — D508 Other iron deficiency anemias: Secondary | ICD-10-CM

## 2021-03-23 LAB — FERRITIN: Ferritin: 101 ng/mL (ref 11–307)

## 2021-03-23 LAB — VITAMIN B12: Vitamin B-12: 660 pg/mL (ref 180–914)

## 2021-03-23 LAB — CBC WITH DIFFERENTIAL/PLATELET
Abs Immature Granulocytes: 0.03 10*3/uL (ref 0.00–0.07)
Basophils Absolute: 0.1 10*3/uL (ref 0.0–0.1)
Basophils Relative: 1 %
Eosinophils Absolute: 0.3 10*3/uL (ref 0.0–0.5)
Eosinophils Relative: 3 %
HCT: 38.4 % (ref 36.0–46.0)
Hemoglobin: 11.9 g/dL — ABNORMAL LOW (ref 12.0–15.0)
Immature Granulocytes: 0 %
Lymphocytes Relative: 38 %
Lymphs Abs: 4 10*3/uL (ref 0.7–4.0)
MCH: 26.5 pg (ref 26.0–34.0)
MCHC: 31 g/dL (ref 30.0–36.0)
MCV: 85.5 fL (ref 80.0–100.0)
Monocytes Absolute: 0.6 10*3/uL (ref 0.1–1.0)
Monocytes Relative: 5 %
Neutro Abs: 5.6 10*3/uL (ref 1.7–7.7)
Neutrophils Relative %: 53 %
Platelets: 436 10*3/uL — ABNORMAL HIGH (ref 150–400)
RBC: 4.49 MIL/uL (ref 3.87–5.11)
RDW: 15.9 % — ABNORMAL HIGH (ref 11.5–15.5)
WBC: 10.6 10*3/uL — ABNORMAL HIGH (ref 4.0–10.5)
nRBC: 0 % (ref 0.0–0.2)

## 2021-03-23 LAB — IRON AND TIBC
Iron: 85 ug/dL (ref 28–170)
Saturation Ratios: 25 % (ref 10.4–31.8)
TIBC: 335 ug/dL (ref 250–450)
UIBC: 250 ug/dL

## 2021-03-23 LAB — VITAMIN D 25 HYDROXY (VIT D DEFICIENCY, FRACTURES): Vit D, 25-Hydroxy: 44.91 ng/mL (ref 30–100)

## 2021-03-23 LAB — FOLATE: Folate: 39 ng/mL (ref >5.9)

## 2021-03-25 LAB — METHYLMALONIC ACID, SERUM: Methylmalonic Acid, Quantitative: 114 nmol/L (ref 0–378)
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
## 2021-03-30 ENCOUNTER — Inpatient Hospital Stay (HOSPITAL_BASED_OUTPATIENT_CLINIC_OR_DEPARTMENT_OTHER): Payer: Medicare HMO | Admitting: Physician Assistant

## 2021-03-30 ENCOUNTER — Other Ambulatory Visit: Payer: Self-pay

## 2021-03-30 VITALS — BP 110/73 | HR 92 | Temp 97.2°F | Resp 18 | Ht 67.72 in | Wt 181.7 lb

## 2021-03-30 DIAGNOSIS — D72829 Elevated white blood cell count, unspecified: Secondary | ICD-10-CM | POA: Diagnosis not present

## 2021-03-30 DIAGNOSIS — Z79899 Other long term (current) drug therapy: Secondary | ICD-10-CM | POA: Diagnosis not present

## 2021-03-30 DIAGNOSIS — D508 Other iron deficiency anemias: Secondary | ICD-10-CM | POA: Diagnosis not present

## 2021-03-30 DIAGNOSIS — E559 Vitamin D deficiency, unspecified: Secondary | ICD-10-CM

## 2021-03-30 DIAGNOSIS — D500 Iron deficiency anemia secondary to blood loss (chronic): Secondary | ICD-10-CM | POA: Diagnosis not present

## 2021-03-30 DIAGNOSIS — D75839 Thrombocytosis, unspecified: Secondary | ICD-10-CM | POA: Diagnosis not present

## 2021-03-30 DIAGNOSIS — K5090 Crohn's disease, unspecified, without complications: Secondary | ICD-10-CM | POA: Diagnosis not present

## 2021-03-30 DIAGNOSIS — N920 Excessive and frequent menstruation with regular cycle: Secondary | ICD-10-CM | POA: Diagnosis not present

## 2021-03-30 DIAGNOSIS — Z87891 Personal history of nicotine dependence: Secondary | ICD-10-CM | POA: Diagnosis not present

## 2021-03-30 NOTE — Patient Instructions (Signed)
[place] at [facility] Discharge Instructions  You were seen today by Tarri Abernethy PA-C for your iron deficiency anemia.  Your blood and iron levels looked great today!  You should continue to take your iron pill once a day and your Vitamin D pill once a week.  We will check you levels and see you again in 6 months.     Thank you for choosing [place] at [facility] to provide your oncology and hematology care.  To afford each patient quality time with our provider, please arrive at least 15 minutes before your scheduled appointment time.   If you have a lab appointment with the [facility] please come in thru the Main Entrance and check in at the main information desk.  You need to re-schedule your appointment should you arrive 10 or more minutes late.  We strive to give you quality time with our providers, and arriving late affects you and other patients whose appointments are after yours.  Also, if you no show three or more times for appointments you may be dismissed from the clinic at the providers discretion.     Again, thank you for choosing [facility].  Our hope is that these requests will decrease the amount of time that you wait before being seen by our physicians.       _____________________________________________________________  Should you have questions after your visit to [facility], please contact our office at [phone] and follow the prompts.  Our office hours are 8:00 a.m. and 4:30 p.m. Monday - Friday.  Please note that voicemails left after 4:00 p.m. may not be returned until the following business day.  We are closed weekends and major holidays.  You do have access to a nurse 24-7, just call the main number to the clinic [phone] and do not press any options, hold on the line and a nurse will answer the phone.    For prescription refill requests, have your pharmacy contact our office  and allow 72 hours.    Due to Covid, you will need to wear a mask upon entering the hospital. If you do not have a mask, a mask will be given to you at the Main Entrance upon arrival. For doctor visits, patients may have 1 support person age 63 or older with them. For treatment visits, patients can not have anyone with them due to social distancing guidelines and our immunocompromised population.

## 2021-03-31 ENCOUNTER — Telehealth (INDEPENDENT_AMBULATORY_CARE_PROVIDER_SITE_OTHER): Payer: Medicare HMO | Admitting: Gastroenterology

## 2021-03-31 ENCOUNTER — Telehealth: Payer: Self-pay | Admitting: Gastroenterology

## 2021-03-31 ENCOUNTER — Encounter: Payer: Self-pay | Admitting: Gastroenterology

## 2021-03-31 ENCOUNTER — Telehealth: Payer: Self-pay | Admitting: *Deleted

## 2021-03-31 DIAGNOSIS — K219 Gastro-esophageal reflux disease without esophagitis: Secondary | ICD-10-CM | POA: Diagnosis not present

## 2021-03-31 DIAGNOSIS — A048 Other specified bacterial intestinal infections: Secondary | ICD-10-CM

## 2021-03-31 DIAGNOSIS — K50819 Crohn's disease of both small and large intestine with unspecified complications: Secondary | ICD-10-CM | POA: Diagnosis not present

## 2021-03-31 NOTE — Patient Instructions (Addendum)
Continue Humira 14m [place] weekly. Continue omeprazole 256mdaily. You had H.pylori gastritis on remote endoscopy in 2009. You were treated at that time. Newer guidelines state we should retest to make sure we treated the H.pylori bacteria adequately. You can do this with a stool specimen. Please go to [facility] at your convenience and pick up stool container. You will need to hold acid reducers like omeprazole and antibiotics for 2 weeks, then collect stool specimen and drop off at lab ([place] Once stool collected, you may restart your omeprazole.  We will call and schedule your colonoscopy in 05/2021.  Call with any questions or concerns in the interim.

## 2021-03-31 NOTE — Telephone Encounter (Signed)
Will call pt to schedule procedure when Dr. Roseanne Kaufman next schedule is available.

## 2021-03-31 NOTE — Telephone Encounter (Signed)
Please send copy of AVS and lab order to patient.  Please let her know instructions about H.pylori testing, I did not discuss during visit.  She needs ileocolonoscopy with Dr. Gala Romney with propofol. ASA 2. She is fine with 05/2021. Per Dr. Gala Romney, she will need pediatric scope to try and get into neoterminal ileum.  Hold iron 7 days.
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
## 2021-03-31 NOTE — Telephone Encounter (Signed)
[name], you are scheduled for a virtual visit with your provider today.  Just as we do with appointments in the office, we must obtain your consent to participate.  Your consent will be active for this visit and any virtual visit you may have with one of our providers in the next 365 days.  If you have a MyChart account, I can also send a copy of this consent to you electronically.  All virtual visits are billed to your insurance company just like a traditional visit in the office.  As this is a virtual visit, video technology does not allow for your provider to perform a traditional examination.  This may limit your provider's ability to fully assess your condition.  If your provider identifies any concerns that need to be evaluated in person or the need to arrange testing such as labs, EKG, etc, we will make arrangements to do so.  Although advances in technology are sophisticated, we cannot ensure that it will always work on either your end or our end.  If the connection with a video visit is poor, we may have to switch to a telephone visit.  With either a video or telephone visit, we are not always able to ensure that we have a secure connection.   I need to obtain your verbal consent now.   Are you willing to proceed with your visit today?

## 2021-03-31 NOTE — Telephone Encounter (Signed)
Pt consented to a virtual visit. 

## 2021-03-31 NOTE — Telephone Encounter (Signed)
Spoke with pt and advised her about how to do h pylori testing. Mailed lab order and AVS to patient. Pt verbalized understanding.

## 2021-04-06 ENCOUNTER — Other Ambulatory Visit: Payer: Self-pay | Admitting: Family Medicine

## 2021-04-06 DIAGNOSIS — D509 Iron deficiency anemia, unspecified: Secondary | ICD-10-CM

## 2021-04-14 ENCOUNTER — Other Ambulatory Visit: Payer: Self-pay

## 2021-04-14 ENCOUNTER — Encounter: Payer: Self-pay | Admitting: Nurse Practitioner

## 2021-04-14 ENCOUNTER — Ambulatory Visit (INDEPENDENT_AMBULATORY_CARE_PROVIDER_SITE_OTHER): Payer: Medicare HMO | Admitting: Nurse Practitioner

## 2021-04-14 VITALS — BP 123/76 | HR 91 | Ht 67.0 in | Wt 186.1 lb

## 2021-04-14 DIAGNOSIS — M545 Low back pain, unspecified: Secondary | ICD-10-CM | POA: Diagnosis not present

## 2021-04-14 MED ORDER — IBUPROFEN 600 MG PO TABS: 600.0000 mg | ORAL_TABLET | Freq: Three times a day (TID) | ORAL | 0 refills | Status: DC | PRN

## 2021-04-14 MED ORDER — METHYLPREDNISOLONE ACETATE 80 MG/ML IJ SUSP
80.0000 mg | Freq: Once | INTRAMUSCULAR | Status: AC
  Administered 2021-04-14: 80 mg via INTRAMUSCULAR

## 2021-04-14 NOTE — Progress Notes (Signed)
° °  ARYANAH ENSLOW     MRN: [id number]      DOB: [birth year]   HPI Ms. Chaffin is here for c/o back pain that started 2 weeks ago, she can not walk as fast, has to have pillows under her legs to relief pain when sitting. She thinks that she could have pulled a muscle when lifting. Pain is a burning sensation 7/10. She has been Tylenol, flexeril and Salome patch, but they have not been helpful.she denies numbness ,tingling, pain shooting down her legs.  ROS Denies recent fever or chills. Denies sinus pressure, nasal congestion, ear pain or sore throat. Denies chest congestion, productive cough or wheezing. Denies chest pains, palpitations and leg swelling Denies abdominal pain, nausea, vomiting,diarrhea or constipation.   Denies dysuria, frequency, hesitancy or incontinence. Has low back joint pain,  and limitation in mobility. Denies headaches, seizures, numbness, or tingling. Denies depression, anxiety or insomnia. Denies skin break down or rash.   PE  BP 123/76 (BP Location: Left Arm, Patient Position: Sitting, Cuff Size: Normal)    Pulse 91    Ht 5' 7"  (1.702 m)    Wt 186 lb 1.3 oz (84.4 kg)    LMP 04/07/2021 (Exact Date)    SpO2 99%    BMI 29.14 kg/m   Patient alert and oriented and in no cardiopulmonary distress.   Chest: Clear to auscultation bilaterally.  CVS: S1, S2 no murmurs, no S3.Regular rate.  ABD: Soft non tender.   Ext: No edema  MS: Adequate ROM spine, shoulders, hips and knees, tenderness on palpation of mid low back area.   Skin: Intact, no ulcerations or rash noted.  Psych: Good eye contact, normal affect. Memory intact not anxious or depressed appearing.  CNS: CN 2-12 intact, power,  normal throughout.no focal deficits noted.   Assessment & Plan

## 2021-04-14 NOTE — Assessment & Plan Note (Addendum)
DepoMedrol 19m IM  injection today RX ibuprofen 6023mthree times daily as needed.

## 2021-04-14 NOTE — Addendum Note (Signed)
Addended by: Jill Side on: 04/14/2021 09:24 AM   Modules accepted: Orders

## 2021-04-14 NOTE — Patient Instructions (Signed)
Please get your shingles vaccine, TDAP vaccine and covid booster at your pharmacy. Please take ibuprofen 62m three times daily as needed for your back pain You have been given a steroid injection to help your back pain today,   It is important that you exercise regularly at least 30 minutes 5 times a week.  Think about what you will eat, plan ahead. Choose " clean, green, fresh or frozen" over canned, processed or packaged foods which are more sugary, salty and fatty. 70 to 75% of food eaten should be vegetables and fruit. Three meals at set times with snacks allowed between meals, but they must be fruit or vegetables. Aim to eat over a 12 hour period , example 7 am to 7 pm, and STOP after  your last meal of the day. Drink water,generally about 64 ounces per day, no other drink is as healthy. Fruit juice is best enjoyed in a healthy way, by EATING the fruit.  [facility] we consider it a privelige to serve you.

## 2021-05-03 ENCOUNTER — Telehealth: Payer: Self-pay | Admitting: *Deleted

## 2021-05-03 ENCOUNTER — Other Ambulatory Visit: Payer: Self-pay | Admitting: Family Medicine

## 2021-05-03 MED ORDER — PEG 3350-KCL-NA BICARB-NACL 420 G PO SOLR: ORAL | 0 refills | Status: DC

## 2021-05-03 NOTE — Telephone Encounter (Signed)
Called pt. Scheduled for ileocolonoscopy, ASA 2 on 3/6 at 8:15am. Aware will send rx to pharmacy. Will send instructions via mychart.

## 2021-05-10 ENCOUNTER — Encounter (HOSPITAL_COMMUNITY): Payer: Self-pay | Admitting: Internal Medicine

## 2021-05-10 ENCOUNTER — Ambulatory Visit (HOSPITAL_BASED_OUTPATIENT_CLINIC_OR_DEPARTMENT_OTHER): Payer: Medicare HMO | Admitting: Anesthesiology

## 2021-05-10 ENCOUNTER — Ambulatory Visit (HOSPITAL_COMMUNITY)
Admission: RE | Admit: 2021-05-10 | Discharge: 2021-05-10 | Disposition: A | Discharge status: Home / Self Care | Payer: Medicare HMO | Attending: Internal Medicine | Admitting: Internal Medicine

## 2021-05-10 ENCOUNTER — Other Ambulatory Visit: Payer: Self-pay

## 2021-05-10 ENCOUNTER — Ambulatory Visit (HOSPITAL_COMMUNITY): Payer: Medicare HMO | Admitting: Anesthesiology

## 2021-05-10 ENCOUNTER — Encounter (HOSPITAL_COMMUNITY)
Admission: RE | Disposition: A | Discharge status: Home / Self Care | Payer: Self-pay | Source: Home / Self Care | Attending: Internal Medicine

## 2021-05-10 DIAGNOSIS — K5090 Crohn's disease, unspecified, without complications: Secondary | ICD-10-CM | POA: Insufficient documentation

## 2021-05-10 DIAGNOSIS — F319 Bipolar disorder, unspecified: Secondary | ICD-10-CM | POA: Diagnosis not present

## 2021-05-10 DIAGNOSIS — K5190 Ulcerative colitis, unspecified, without complications: Secondary | ICD-10-CM | POA: Diagnosis not present

## 2021-05-10 DIAGNOSIS — K633 Ulcer of intestine: Secondary | ICD-10-CM | POA: Diagnosis not present

## 2021-05-10 DIAGNOSIS — D500 Iron deficiency anemia secondary to blood loss (chronic): Secondary | ICD-10-CM

## 2021-05-10 DIAGNOSIS — R69 Illness, unspecified: Secondary | ICD-10-CM | POA: Diagnosis not present

## 2021-05-10 DIAGNOSIS — K6289 Other specified diseases of anus and rectum: Secondary | ICD-10-CM | POA: Diagnosis not present

## 2021-05-10 DIAGNOSIS — Z8619 Personal history of other infectious and parasitic diseases: Secondary | ICD-10-CM | POA: Insufficient documentation

## 2021-05-10 DIAGNOSIS — Z9989 Dependence on other enabling machines and devices: Secondary | ICD-10-CM | POA: Diagnosis not present

## 2021-05-10 DIAGNOSIS — Z8711 Personal history of peptic ulcer disease: Secondary | ICD-10-CM | POA: Diagnosis not present

## 2021-05-10 DIAGNOSIS — K219 Gastro-esophageal reflux disease without esophagitis: Secondary | ICD-10-CM | POA: Diagnosis not present

## 2021-05-10 DIAGNOSIS — G4733 Obstructive sleep apnea (adult) (pediatric): Secondary | ICD-10-CM | POA: Diagnosis not present

## 2021-05-10 DIAGNOSIS — D509 Iron deficiency anemia, unspecified: Secondary | ICD-10-CM | POA: Insufficient documentation

## 2021-05-10 DIAGNOSIS — G4730 Sleep apnea, unspecified: Secondary | ICD-10-CM | POA: Diagnosis not present

## 2021-05-10 DIAGNOSIS — Z87891 Personal history of nicotine dependence: Secondary | ICD-10-CM | POA: Diagnosis not present

## 2021-05-10 DIAGNOSIS — J45909 Unspecified asthma, uncomplicated: Secondary | ICD-10-CM | POA: Insufficient documentation

## 2021-05-10 HISTORY — PX: BIOPSY: SHX5522

## 2021-05-10 HISTORY — PX: COLONOSCOPY WITH PROPOFOL: SHX5780

## 2021-05-10 LAB — PREGNANCY, URINE: Preg Test, Ur: NEGATIVE

## 2021-05-10 SURGERY — COLONOSCOPY WITH PROPOFOL
Anesthesia: General

## 2021-05-10 MED ORDER — LACTATED RINGERS IV SOLN: INTRAVENOUS | Status: DC

## 2021-05-10 MED ORDER — PHENYLEPHRINE 40 MCG/ML (10ML) SYRINGE FOR IV PUSH (FOR BLOOD PRESSURE SUPPORT)
PREFILLED_SYRINGE | INTRAVENOUS | Status: DC | PRN
  Administered 2021-05-10: 80 ug via INTRAVENOUS

## 2021-05-10 MED ORDER — PROPOFOL 10 MG/ML IV BOLUS
INTRAVENOUS | Status: DC | PRN
  Administered 2021-05-10: 80 mg via INTRAVENOUS

## 2021-05-10 MED ORDER — LIDOCAINE 2% (20 MG/ML) 5 ML SYRINGE
INTRAMUSCULAR | Status: DC | PRN
Start: 2021-05-10 — End: 2021-05-10
  Administered 2021-05-10: 50 mg via INTRAVENOUS

## 2021-05-10 MED ORDER — PROPOFOL 500 MG/50ML IV EMUL
INTRAVENOUS | Status: DC | PRN
Start: 2021-05-10 — End: 2021-05-10
  Administered 2021-05-10: 200 ug/kg/min via INTRAVENOUS

## 2021-05-10 NOTE — Anesthesia Postprocedure Evaluation (Signed)
Anesthesia Post Note ? ?Patient: EDAN SERRATORE ? ?Procedure(s) Performed: COLONOSCOPY WITH PROPOFOL ?BIOPSY ? ?Patient location during evaluation: Phase II ?Anesthesia Type: General ?Level of consciousness: awake ?Pain management: pain level controlled ?Vital Signs Assessment: post-procedure vital signs reviewed and stable ?Respiratory status: spontaneous breathing and respiratory function stable ?Cardiovascular status: blood pressure returned to baseline and stable ?Postop Assessment: no headache and no apparent nausea or vomiting ?Anesthetic complications: no ?Comments: Late entry ? ? ?No notable events documented. ? ? ?Last Vitals:  ?Vitals:  ? 05/10/21 0708 05/10/21 0904  ?BP: 122/83 (!) 100/53  ?Pulse: 97   ?Resp: 18 18  ?Temp: 36.6 ?C (!) 36.4 ?C  ?SpO2: 100% 97%  ?  ?Last Pain:  ?Vitals:  ? 05/10/21 0904  ?TempSrc: Oral  ?PainSc: 0-No pain  ? ? ?  ?  ?  ?  ?  ?  ? ?Louann Sjogren ? ? ? ? ?

## 2021-05-10 NOTE — Anesthesia Preprocedure Evaluation (Signed)
Anesthesia Evaluation  ?Patient identified by MRN, date of birth, ID band ?Patient awake ? ? ? ?Reviewed: ?Allergy & Precautions, H&P , NPO status , Patient's Chart, lab work & pertinent test results, reviewed documented beta blocker date and time  ? ?Airway ?Mallampati: II ? ?TM Distance: >3 FB ?Neck ROM: full ? ? ? Dental ?no notable dental hx. ? ?  ?Pulmonary ?asthma , sleep apnea and Continuous Positive Airway Pressure Ventilation , former smoker,  ?  ?Pulmonary exam normal ?breath sounds clear to auscultation ? ? ? ? ? ? Cardiovascular ?Exercise Tolerance: Good ?negative cardio ROS ? ? ?Rhythm:regular Rate:Normal ? ? ?  ?Neuro/Psych ?PSYCHIATRIC DISORDERS Anxiety Depression Bipolar Disorder negative neurological ROS ?   ? GI/Hepatic ?Neg liver ROS, PUD, GERD  Medicated,  ?Endo/Other  ?negative endocrine ROS ? Renal/GU ?negative Renal ROS  ?negative genitourinary ?  ?Musculoskeletal ? ? Abdominal ?  ?Peds ? Hematology ? ?(+) Blood dyscrasia, anemia ,   ?Anesthesia Other Findings ? ? Reproductive/Obstetrics ?negative OB ROS ? ?  ? ? ? ? ? ? ? ? ? ? ? ? ? ?  ?  ? ? ? ? ? ? ? ? ?Anesthesia Physical ?Anesthesia Plan ? ?ASA: 3 ? ?Anesthesia Plan: General  ? ?Post-op Pain Management:   ? ?Induction:  ? ?PONV Risk Score and Plan: Propofol infusion ? ?Airway Management Planned:  ? ?Additional Equipment:  ? ?Intra-op Plan:  ? ?Post-operative Plan:  ? ?Informed Consent: I have reviewed the patients History and Physical, chart, labs and discussed the procedure including the risks, benefits and alternatives for the proposed anesthesia with the patient or authorized representative who has indicated his/her understanding and acceptance.  ? ? ? ?Dental Advisory Given ? ?Plan Discussed with: CRNA ? ?Anesthesia Plan Comments:   ? ? ? ? ? ? ?Anesthesia Quick Evaluation ? ?

## 2021-05-10 NOTE — Discharge Instructions (Addendum)
?  Colonoscopy ?Discharge Instructions ? ?Read the instructions outlined below and refer to this sheet in the next few weeks. These discharge instructions provide you with general information on caring for yourself after you leave the hospital. Your doctor may also give you specific instructions. While your treatment has been planned according to the most current medical practices available, unavoidable complications occasionally occur. If you have any problems or questions after discharge, call Dr. Gala Romney at [phone]. ?ACTIVITY ?You may resume your regular activity, but move at a slower pace for the next 24 hours.  ?Take frequent rest periods for the next 24 hours.  ?Walking will help get rid of the air and reduce the bloated feeling in your belly (abdomen).  ?No driving for 24 hours (because of the medicine (anesthesia) used during the test).   ?Do not sign any important legal documents or operate any machinery for 24 hours (because of the anesthesia used during the test).  ?NUTRITION ?Drink plenty of fluids.  ?You may resume your normal diet as instructed by your doctor.  ?Begin with a light meal and progress to your normal diet. Heavy or fried foods are harder to digest and may make you feel sick to your stomach (nauseated).  ?Avoid alcoholic beverages for 24 hours or as instructed.  ?MEDICATIONS ?You may resume your normal medications unless your doctor tells you otherwise.  ?WHAT YOU CAN EXPECT TODAY ?Some feelings of bloating in the abdomen.  ?Passage of more gas than usual.  ?Spotting of blood in your stool or on the toilet paper.  ?IF YOU HAD POLYPS REMOVED DURING THE COLONOSCOPY: ?No aspirin products for 7 days or as instructed.  ?No alcohol for 7 days or as instructed.  ?Eat a soft diet for the next 24 hours.  ?FINDING OUT THE RESULTS OF YOUR TEST ?Not all test results are available during your visit. If your test results are not back during the visit, make an appointment with your caregiver to find out the  results. Do not assume everything is normal if you have not heard from your caregiver or the medical facility. It is important for you to follow up on all of your test results.  ?SEEK IMMEDIATE MEDICAL ATTENTION IF: ?You have more than a spotting of blood in your stool.  ?Your belly is swollen (abdominal distention).  ?You are nauseated or vomiting.  ?You have a temperature over 101.  ?You have abdominal pain or discomfort that is severe or gets worse throughout the day.   ? ?Small nodule and area of inflammation biopsied.  Otherwise, colon looked good.  Small intestine could not be seen because of narrowing at the opening which is chronic. ? ?Further recommendations to follow pending review of pathology report ? ?Office visit with [place] in 3 months Neil Crouch) ? ?Please complete H. pylori stool antigen testing later in the week as discussed ? ?Please minimize use of ibuprofen and other nonsteroidal agents as they may aggravate Crohn's disease producing inflammation and could contribute to iron deficiency anemia. ? ?At patient request, I called Lennette Bihari at [phone] -call rolled to voicemail.  Left a message. ?
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
## 2021-05-10 NOTE — Transfer of Care (Signed)
Immediate Anesthesia Transfer of Care Note ? ?Patient: [name] ? ?Procedure(s) Performed: COLONOSCOPY WITH PROPOFOL ?BIOPSY ? ?Patient Location: Endoscopy Unit ? ?Anesthesia Type:MAC ? ?Level of Consciousness: awake, alert , oriented and patient cooperative ? ?Airway & Oxygen Therapy: Patient Spontanous Breathing ? ?Post-op Assessment: Report given to RN, Post -op Vital signs reviewed and stable and Patient moving all extremities ? ?Post vital signs: Reviewed and stable ? ?Last Vitals:  ?Vitals Value Taken Time  ?BP    ?Temp    ?Pulse    ?Resp    ?SpO2    ? ? ?Last Pain:  ?Vitals:  ? 05/10/21 0708  ?TempSrc: Oral  ?PainSc: 0-No pain  ?   ? ?Patients Stated Pain Goal: 8 (05/10/21 0708) ? ?Complications: No notable events documented. ?

## 2021-05-10 NOTE — Op Note (Signed)
[facility] ?Patient Name: [name] ?Procedure Date: 05/10/2021 8:20 AM ?MRN: [id number] ?Date of Birth: 03-30-1970 ?Attending MD: Norvel Richards , MD ?CSN: [id number] ?Age: 51 ?Admit Type: Outpatient ?Procedure:                Colonoscopy ?Indications:              Iron deficiency anemia ?Providers:                Norvel Richards, MD, Janeece Riggers, RN, Eugene Garnet  ?                          Shanon Brow, Technician ?Referring MD:              ?Medicines:                Propofol per Anesthesia ?Complications:            No immediate complications. ?Estimated Blood Loss:     Estimated blood loss was minimal. ?Procedure:                Pre-Anesthesia Assessment: ?                          - Prior to the procedure, a History and Physical  ?                          was performed, and patient medications and  ?                          allergies were reviewed. The patient's tolerance of  ?                          previous anesthesia was also reviewed. The risks  ?                          and benefits of the procedure and the sedation  ?                          options and risks were discussed with the patient.  ?                          All questions were answered, and informed consent  ?                          was obtained. Prior Anticoagulants: The patient has  ?                          taken no previous anticoagulant or antiplatelet  ?                          agents. ASA Grade Assessment: II - A patient with  ?                          mild systemic disease. After reviewing the risks  ?  and benefits, the patient was deemed in  ?                          satisfactory condition to undergo the procedure. ?                          After obtaining informed consent, the colonoscope  ?                          was passed under direct vision. Throughout the  ?                          procedure, the patient's blood pressure, pulse, and  ?                          oxygen saturations  were monitored continuously. The  ?                          PCF-HQ190L ([id number]) scope was introduced through  ?                          the anus and advanced to the the proximal extent of  ?                          the colon., The colonoscopy was performed without  ?                          difficulty. The patient tolerated the procedure  ?                          well. The quality of the bowel preparation was  ?                          adequate. ?Scope In: 8:45:17 AM ?Scope Out: 9:02:16 AM ?Total Procedure Duration: 0 hours 16 minutes 59 seconds  ?Findings: ?     The perianal and digital rectal examinations were normal. Cecum gone.  ?     Status post prior surgery, chronic surgical changes at the upstream end  ?     of the colon. 8 mm nodule at the anastomosis. 4 mm ulcer present as  ?     well. Apparent opening to the neoterminal ileum very narrowed unable to  ?     admit the pediatric colonoscope. Otherwise, no inflammatory changes  ?     observed. The remainder of the colonic mucosa appeared normal. Biopsies  ?     of the ulcer and nausea were taken separately. ?Impression:               - Surgical changes proximal colon???chronic. Small  ?                          nodule likely not significant - status post biopsy.  ?                          Adjacent small ulcer biopsied. May be contributing  ?  to iron deficiency anemia. May be postop finding.  ?                          Recent ibuprofen use may damaging mucosa in the  ?                          setting of inflammatory bowel disease and iron  ?                          deficiency anemia. However, as noted previously,  ?                          patient is clinically in remission as far as  ?                          Crohn's disease is concerned. ?Moderate Sedation: ?     Moderate (conscious) sedation was personally administered by an  ?     anesthesia professional. The following parameters were monitored: oxygen  ?      saturation, heart rate, blood pressure, respiratory rate, EKG, adequacy  ?     of pulmonary ventilation, and response to care. ?Recommendation:           - Advance diet as tolerated. ?                          - Continue present medications. ?                          - Repeat colonoscopy for surveillance based on  ?                          pathology results. ?                          - Return to GI office in 3 months. Please submit a  ?                          stool sample for H. pylori stool antigen testing as  ?                          previously recommended. Minimize use of ibuprofen  ?                          and other nonsteroidal agents. Further  ?                          recommendations to follow pending review of  ?                          pathology report. ?Procedure Code(s):        --- Professional --- ?                          [phone], Colonoscopy, flexible; diagnostic, including  ?  collection of specimen(s) by brushing or washing,  ?                          when performed (separate procedure) ?Diagnosis Code(s):        --- Professional --- ?                          D50.9, Iron deficiency anemia, unspecified ?CPT copyright 2019 American Medical Association. All rights reserved. ?The codes documented in this report are preliminary and upon coder review may  ?be revised to meet current compliance requirements. ?Cristopher Estimable. Zephyr Ridley, MD ?Norvel Richards, MD ?05/10/2021 9:17:50 AM ?This report has been signed electronically. ?Number of Addenda: 0 ?

## 2021-05-11 ENCOUNTER — Encounter: Payer: Self-pay | Admitting: Internal Medicine

## 2021-05-11 LAB — SURGICAL PATHOLOGY

## 2021-05-12 ENCOUNTER — Encounter: Payer: Self-pay | Admitting: Family Medicine

## 2021-05-13 ENCOUNTER — Encounter (HOSPITAL_COMMUNITY): Payer: Self-pay | Admitting: Internal Medicine

## 2021-05-26 ENCOUNTER — Encounter: Payer: Self-pay | Admitting: Gastroenterology

## 2021-06-21 ENCOUNTER — Encounter: Payer: Self-pay | Admitting: Internal Medicine

## 2021-08-04 ENCOUNTER — Ambulatory Visit: Payer: Medicare HMO | Admitting: Gastroenterology

## 2021-08-24 ENCOUNTER — Ambulatory Visit: Payer: Medicare HMO | Admitting: Gastroenterology

## 2021-08-24 ENCOUNTER — Ambulatory Visit (INDEPENDENT_AMBULATORY_CARE_PROVIDER_SITE_OTHER): Payer: Medicare HMO | Admitting: *Deleted

## 2021-08-24 DIAGNOSIS — Z0000 Encounter for general adult medical examination without abnormal findings: Secondary | ICD-10-CM

## 2021-08-24 NOTE — Patient Instructions (Signed)
[name] , Thank you for taking time to come for your Medicare Wellness Visit. I appreciate your ongoing commitment to your health goals. Please review the following plan we discussed and let me know if I can assist you in the future.   Screening recommendations/referrals: Colonoscopy: due 05-11-31 Mammogram: due 09-28-21 Recommended yearly ophthalmology/optometry visit for glaucoma screening and checkup Recommended yearly dental visit for hygiene and checkup  Vaccinations: Influenza vaccine: completed Tdap vaccine: competed per patient Shingles vaccine: completed per patient    Advanced directives: information provided  Next appointment: 1 year  Preventive Care 40-64 Years, Female Preventive care refers to lifestyle choices and visits with your health care provider that can promote health and wellness. What does preventive care include? A yearly physical exam. This is also called an annual well check. Dental exams once or twice a year. Routine eye exams. Ask your health care provider how often you should have your eyes checked. Personal lifestyle choices, including: Daily care of your teeth and gums. Regular physical activity. Eating a healthy diet. Avoiding tobacco and drug use. Limiting alcohol use. Practicing safe sex. Taking low-dose aspirin daily starting at age 68. Taking vitamin and mineral supplements as recommended by your health care provider. What happens during an annual well check? The services and screenings done by your health care provider during your annual well check will depend on your age, overall health, lifestyle risk factors, and family history of disease. Counseling  Your health care provider may ask you questions about your: Alcohol use. Tobacco use. Drug use. Emotional well-being. Home and relationship well-being. Sexual activity. Eating habits. Work and work Statistician. Method of birth control. Menstrual cycle. Pregnancy history. Screening   You may have the following tests or measurements: Height, weight, and BMI. Blood pressure. Lipid and cholesterol levels. These may be checked every 5 years, or more frequently if you are over 13 years old. Skin check. Lung cancer screening. You may have this screening every year starting at age 53 if you have a 30-pack-year history of smoking and currently smoke or have quit within the past 15 years. Fecal occult blood test (FOBT) of the stool. You may have this test every year starting at age 6. Flexible sigmoidoscopy or colonoscopy. You may have a sigmoidoscopy every 5 years or a colonoscopy every 10 years starting at age 27. Hepatitis C blood test. Hepatitis B blood test. Sexually transmitted disease (STD) testing. Diabetes screening. This is done by checking your blood sugar (glucose) after you have not eaten for a while (fasting). You may have this done every 1-3 years. Mammogram. This may be done every 1-2 years. Talk to your health care provider about when you should start having regular mammograms. This may depend on whether you have a family history of breast cancer. BRCA-related cancer screening. This may be done if you have a family history of breast, ovarian, tubal, or peritoneal cancers. Pelvic exam and Pap test. This may be done every 3 years starting at age 70. Starting at age 53, this may be done every 5 years if you have a Pap test in combination with an HPV test. Bone density scan. This is done to screen for osteoporosis. You may have this scan if you are at high risk for osteoporosis. Discuss your test results, treatment options, and if necessary, the need for more tests with your health care provider. Vaccines  Your health care provider may recommend certain vaccines, such as: Influenza vaccine. This is recommended every year. Tetanus, diphtheria,  and acellular pertussis (Tdap, Td) vaccine. You may need a Td booster every 10 years. Zoster vaccine. You may need this after  age 41. Pneumococcal 13-valent conjugate (PCV13) vaccine. You may need this if you have certain conditions and were not previously vaccinated. Pneumococcal polysaccharide (PPSV23) vaccine. You may need one or two doses if you smoke cigarettes or if you have certain conditions. Talk to your health care provider about which screenings and vaccines you need and how often you need them. This information is not intended to replace advice given to you by your health care provider. Make sure you discuss any questions you have with your health care provider. Document Released: 03/20/2015 Document Revised: 11/11/2015 Document Reviewed: 12/23/2014 Elsevier Interactive Patient Education  2017 [employer] Prevention in the Home Falls can cause injuries. They can happen to people of all ages. There are many things you can do to make your home safe and to help prevent falls. What can I do on the outside of my home? Regularly fix the edges of walkways and driveways and fix any cracks. Remove anything that might make you trip as you walk through a door, such as a raised step or threshold. Trim any bushes or trees on the path to your home. Use bright outdoor lighting. Clear any walking paths of anything that might make someone trip, such as rocks or tools. Regularly check to see if handrails are loose or broken. Make sure that both sides of any steps have handrails. Any raised decks and porches should have guardrails on the edges. Have any leaves, snow, or ice cleared regularly. Use sand or salt on walking paths during winter. Clean up any spills in your garage right away. This includes oil or grease spills. What can I do in the bathroom? Use night lights. Install grab bars by the toilet and in the tub and shower. Do not use towel bars as grab bars. Use non-skid mats or decals in the tub or shower. If you need to sit down in the shower, use a plastic, non-slip stool. Keep the floor dry. Clean  up any water that spills on the floor as soon as it happens. Remove soap buildup in the tub or shower regularly. Attach bath mats securely with double-sided non-slip rug tape. Do not have throw rugs and other things on the floor that can make you trip. What can I do in the bedroom? Use night lights. Make sure that you have a light by your bed that is easy to reach. Do not use any sheets or blankets that are too big for your bed. They should not hang down onto the floor. Have a firm chair that has side arms. You can use this for support while you get dressed. Do not have throw rugs and other things on the floor that can make you trip. What can I do in the kitchen? Clean up any spills right away. Avoid walking on wet floors. Keep items that you use a lot in easy-to-reach places. If you need to reach something above you, use a strong step stool that has a grab bar. Keep electrical cords out of the way. Do not use floor polish or wax that makes floors slippery. If you must use wax, use non-skid floor wax. Do not have throw rugs and other things on the floor that can make you trip. What can I do with my stairs? Do not leave any items on the stairs. Make sure that there are  handrails on both sides of the stairs and use them. Fix handrails that are broken or loose. Make sure that handrails are as long as the stairways. Check any carpeting to make sure that it is firmly attached to the stairs. Fix any carpet that is loose or worn. Avoid having throw rugs at the top or bottom of the stairs. If you do have throw rugs, attach them to the floor with carpet tape. Make sure that you have a light switch at the top of the stairs and the bottom of the stairs. If you do not have them, ask someone to add them for you. What else can I do to help prevent falls? Wear shoes that: Do not have high heels. Have rubber bottoms. Are comfortable and fit you well. Are closed at the toe. Do not wear sandals. If you  use a stepladder: Make sure that it is fully opened. Do not climb a closed stepladder. Make sure that both sides of the stepladder are locked into place. Ask someone to hold it for you, if possible. Clearly mark and make sure that you can see: Any grab bars or handrails. First and last steps. Where the edge of each step is. Use tools that help you move around (mobility aids) if they are needed. These include: Canes. Walkers. Scooters. Crutches. Turn on the lights when you go into a dark area. Replace any light bulbs as soon as they burn out. Set up your furniture so you have a clear path. Avoid moving your furniture around. If any of your floors are uneven, fix them. If there are any pets around you, be aware of where they are. Review your medicines with your doctor. Some medicines can make you feel dizzy. This can increase your chance of falling. Ask your doctor what other things that you can do to help prevent falls. This information is not intended to replace advice given to you by your health care provider. Make sure you discuss any questions you have with your health care provider. Document Released: 12/18/2008 Document Revised: 07/30/2015 Document Reviewed: 03/28/2014 Elsevier Interactive Patient Education  2017 [employer].
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
## 2021-08-31 ENCOUNTER — Inpatient Hospital Stay: Payer: Medicare HMO | Admitting: Gastroenterology

## 2021-09-06 ENCOUNTER — Telehealth (INDEPENDENT_AMBULATORY_CARE_PROVIDER_SITE_OTHER): Payer: Medicare HMO | Admitting: Gastroenterology

## 2021-09-06 ENCOUNTER — Encounter: Payer: Self-pay | Admitting: Gastroenterology

## 2021-09-06 VITALS — Ht 67.5 in | Wt 177.0 lb

## 2021-09-06 DIAGNOSIS — K219 Gastro-esophageal reflux disease without esophagitis: Secondary | ICD-10-CM

## 2021-09-06 DIAGNOSIS — K5080 Crohn's disease of both small and large intestine without complications: Secondary | ICD-10-CM
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
## 2021-09-06 NOTE — Patient Instructions (Addendum)
Continue Humira 28m every week. Continue omeprazole 268mdaily. Follow up with hematology as scheduled.  I forgot to remind you while we were on the phone, I will also send you a mychart message. It looks like you have not completed the stool test to make sure the H.pylori bacteria was adequately treated years ago. You will need to stop omeprazole for 2 weeks before collecting the stool and then you can restart afterwards. Go by [facility] and get stool container from them. H.pylori can cause cancer in rare instances, so we want to make sure you no longer having the bug in your stomach.   We will see you back in six months, virtual or in person visit.

## 2021-09-23 ENCOUNTER — Other Ambulatory Visit (HOSPITAL_COMMUNITY): Payer: Self-pay | Admitting: Family Medicine

## 2021-09-23 ENCOUNTER — Encounter: Payer: Self-pay | Admitting: Family Medicine

## 2021-09-23 ENCOUNTER — Other Ambulatory Visit: Payer: Self-pay

## 2021-09-23 ENCOUNTER — Other Ambulatory Visit (HOSPITAL_COMMUNITY): Payer: Self-pay | Admitting: *Deleted

## 2021-09-23 ENCOUNTER — Inpatient Hospital Stay (HOSPITAL_COMMUNITY): Payer: Medicare HMO | Attending: Hematology

## 2021-09-23 ENCOUNTER — Other Ambulatory Visit: Payer: Self-pay | Admitting: Family Medicine

## 2021-09-23 DIAGNOSIS — K219 Gastro-esophageal reflux disease without esophagitis: Secondary | ICD-10-CM | POA: Diagnosis not present

## 2021-09-23 DIAGNOSIS — E559 Vitamin D deficiency, unspecified: Secondary | ICD-10-CM | POA: Diagnosis not present

## 2021-09-23 DIAGNOSIS — D75839 Thrombocytosis, unspecified: Secondary | ICD-10-CM

## 2021-09-23 DIAGNOSIS — K5090 Crohn's disease, unspecified, without complications: Secondary | ICD-10-CM | POA: Insufficient documentation

## 2021-09-23 DIAGNOSIS — D72829 Elevated white blood cell count, unspecified: Secondary | ICD-10-CM | POA: Insufficient documentation

## 2021-09-23 DIAGNOSIS — Z87891 Personal history of nicotine dependence: Secondary | ICD-10-CM | POA: Insufficient documentation

## 2021-09-23 DIAGNOSIS — Z79899 Other long term (current) drug therapy: Secondary | ICD-10-CM | POA: Diagnosis not present

## 2021-09-23 DIAGNOSIS — Z1231 Encounter for screening mammogram for malignant neoplasm of breast: Secondary | ICD-10-CM

## 2021-09-23 DIAGNOSIS — D508 Other iron deficiency anemias: Secondary | ICD-10-CM

## 2021-09-23 DIAGNOSIS — Z836 Family history of other diseases of the respiratory system: Secondary | ICD-10-CM | POA: Diagnosis not present

## 2021-09-23 DIAGNOSIS — D509 Iron deficiency anemia, unspecified: Secondary | ICD-10-CM | POA: Diagnosis not present

## 2021-09-23 DIAGNOSIS — R5383 Other fatigue: Secondary | ICD-10-CM | POA: Insufficient documentation

## 2021-09-23 DIAGNOSIS — Z814 Family history of other substance abuse and dependence: Secondary | ICD-10-CM | POA: Diagnosis not present

## 2021-09-23 DIAGNOSIS — Z818 Family history of other mental and behavioral disorders: Secondary | ICD-10-CM | POA: Diagnosis not present

## 2021-09-23 LAB — VITAMIN D 25 HYDROXY (VIT D DEFICIENCY, FRACTURES)
Vit D, 25-Hydroxy: 29.44 ng/mL — ABNORMAL LOW (ref 30–100)
Vit D, 25-Hydroxy: 31.73 ng/mL (ref 30–100)

## 2021-09-23 LAB — CBC WITH DIFFERENTIAL/PLATELET
Abs Immature Granulocytes: 0.03 10*3/uL (ref 0.00–0.07)
Basophils Absolute: 0.1 10*3/uL (ref 0.0–0.1)
Basophils Relative: 1 %
Eosinophils Absolute: 0.3 10*3/uL (ref 0.0–0.5)
Eosinophils Relative: 3 %
HCT: 36.2 % (ref 36.0–46.0)
Hemoglobin: 12 g/dL (ref 12.0–15.0)
Immature Granulocytes: 0 %
Lymphocytes Relative: 34 %
Lymphs Abs: 3.5 10*3/uL (ref 0.7–4.0)
MCH: 27.8 pg (ref 26.0–34.0)
MCHC: 33.1 g/dL (ref 30.0–36.0)
MCV: 84 fL (ref 80.0–100.0)
Monocytes Absolute: 0.5 10*3/uL (ref 0.1–1.0)
Monocytes Relative: 5 %
Neutro Abs: 5.8 10*3/uL (ref 1.7–7.7)
Neutrophils Relative %: 57 %
Platelets: 362 10*3/uL (ref 150–400)
RBC: 4.31 MIL/uL (ref 3.87–5.11)
RDW: 15.8 % — ABNORMAL HIGH (ref 11.5–15.5)
WBC: 10.3 10*3/uL (ref 4.0–10.5)
nRBC: 0 % (ref 0.0–0.2)

## 2021-09-23 LAB — IRON AND TIBC
Iron: 90 ug/dL (ref 28–170)
Iron: 90 ug/dL (ref 28–170)
Saturation Ratios: 26 % (ref 10.4–31.8)
Saturation Ratios: 27 % (ref 10.4–31.8)
TIBC: 343 ug/dL (ref 250–450)
TIBC: 335 ug/dL (ref 250–450)
UIBC: 253 ug/dL
UIBC: 245 ug/dL

## 2021-09-23 LAB — FERRITIN
Ferritin: 63 ng/mL (ref 11–307)
Ferritin: 62 ng/mL (ref 11–307)

## 2021-09-23 MED ORDER — BETAMETHASONE DIPROPIONATE 0.05 % EX CREA: TOPICAL_CREAM | Freq: Two times a day (BID) | CUTANEOUS | 0 refills | Status: DC

## 2021-09-24 ENCOUNTER — Inpatient Hospital Stay (HOSPITAL_COMMUNITY): Payer: Medicare HMO

## 2021-09-30 NOTE — Progress Notes (Signed)
Virtual Visit via Telephone Note [facility]  I connected with [name]  on 10/01/21 at  1:37 PM by telephone and verified that I am speaking with the correct person using two identifiers.  Location: Patient: Home Provider: [facility]   I discussed the limitations, risks, security and privacy concerns of performing an evaluation and management service by telephone and the availability of in person appointments. I also discussed with the patient that there may be a patient responsible charge related to this service. The patient expressed understanding and agreed to proceed.  REASON FOR VISIT:  Follow-up for iron deficiency anemia   CURRENT THERAPY: Oral iron supplementation with intermittent IV iron as needed  INTERVAL HISTORY: [name] is contacted today for follow-up of  her iron deficiency anemia.  She was last seen by Tarri Abernethy PA-C on 03/30/2021.   At today's visit, she reports feeling fairly well.  No recent hospitalizations, surgeries, or changes in baseline health status.  She continues to have heavy periods, but notes that they have been more irregular lately and are slightly less heavy than before.  She does have occasional scant rectal bleeding on tissue, which she suspects is related to hemorrhoids. She denies any gross hematochezia, melena, hematemesis, or epistaxis.  She reports ongoing fatigue with energy about 75%, which she relates to poor quality of sleep at night, reports that she wakes up and has a hard time falling back to sleep.   She admits to occasional headaches.  She denies any pica, restless legs, chest pain, dyspnea on exertion, lightheadedness, or syncope.  She continues to take her daily oral iron supplementation and is tolerating it well.   Regarding her leukocytosis and thrombocytosis, she has not had any recent steroids or infections.  No B symptoms such as fever, chills, night sweats, unintentional weight  loss.   She has 75 % energy and 100% appetite. She endorses that she is maintaining a stable weight.    OBSERVATIONS/OBJECTIVE: Review of Systems  Constitutional:  Positive for malaise/fatigue. Negative for chills, diaphoresis, fever and weight loss.  Respiratory:  Negative for cough and shortness of breath.   Cardiovascular:  Negative for chest pain and palpitations.  Gastrointestinal:  Negative for abdominal pain, blood in stool, melena, nausea and vomiting.  Neurological:  Positive for headaches (occasional). Negative for dizziness.  Psychiatric/Behavioral:  The patient has insomnia.      PHYSICAL EXAM (per limitations of virtual telephone visit): The patient is alert and oriented x 3, exhibiting adequate mentation, good mood, and ability to speak in full sentences and execute sound judgement.   ASSESSMENT & PLAN: 1.  Iron deficiency anemia: - Iron deficiency anemia secondary to menstrual blood loss; patient has a history of menorrhagia, but her periods have been lighter and less regular, likely due to perimenopause - She has underlying Crohn's disease, but denies any recent GI blood loss  - Colonoscopy (05/10/2021) Proximal colon with suspected surgical changes, small nodule and adjacent small ulcer biopsied, possibly contributing to IDA - pathology showed inflammation, no malignancy. - She is on weekly Humira for Crohn's - Last Venofer was on 09/11/2019. - She has been taking oral iron supplement (Niferex) at home with improvement in her iron levels - Most recent labs (09/23/2021): Hgb 12.0/MCV 84.0, ferritin 63, iron saturation 26%.  Normal B12/methylmalonic acid, folate when checked in January 2023. - PLAN: Continue oral iron supplement.  No parenteral iron therapy needed at this time.  RTC  in 6 months with labs the week before.     2.  Vitamin D deficiency: - Vitamin D level is 31.73 (09/23/2021) - She is taking Vitamin D 50,000 units weekly  - PLAN: Continue vitamin D 50,000 units  weekly.     3.  Leukocytosis / thrombocytosis: - She has had intermittent leukocytosis and thrombocytosis since at least 2008, associated with steroid use and previous infections   - Most recent CBC (09/23/2021) with normal WBC and platelets - No recent steroid use or infections.  Former smoker, quit in 2013.  No B symptoms or unintentional weight loss. - Overall picture favors reactive leukocytosis and thrombocytosis in the setting of Crohn's disease.  Watchful waiting remains appropriate for the time being. - PLAN: Repeat CBC at follow-up visit in 6 months.  We will consider further work-up if any significant deviation from baseline.     PLAN SUMMARY & DISPOSITION: Labs and RTC in 6 months with phone visit    I discussed the assessment and treatment plan with the patient. The patient was provided an opportunity to ask questions and all were answered. The patient agreed with the plan and demonstrated an understanding of the instructions.   The patient was advised to call back or seek an in-person evaluation if the symptoms worsen or if the condition fails to improve as anticipated.  I provided 14 minutes of non-face-to-face time during this encounter.   Harriett Rush, PA-C 09/23/2021 1:55 PM

## 2021-10-01 ENCOUNTER — Inpatient Hospital Stay (HOSPITAL_BASED_OUTPATIENT_CLINIC_OR_DEPARTMENT_OTHER): Payer: Medicare HMO | Admitting: Physician Assistant

## 2021-10-01 DIAGNOSIS — E559 Vitamin D deficiency, unspecified: Secondary | ICD-10-CM

## 2021-10-01 DIAGNOSIS — D508 Other iron deficiency anemias: Secondary | ICD-10-CM

## 2021-10-04 ENCOUNTER — Ambulatory Visit (HOSPITAL_COMMUNITY)
Admission: RE | Admit: 2021-10-04 | Discharge: 2021-10-04 | Disposition: A | Discharge status: Home / Self Care | Payer: Medicare HMO | Source: Ambulatory Visit | Attending: Family Medicine | Admitting: Family Medicine

## 2021-10-04 DIAGNOSIS — Z1231 Encounter for screening mammogram for malignant neoplasm of breast: Secondary | ICD-10-CM | POA: Insufficient documentation

## 2021-12-17 ENCOUNTER — Other Ambulatory Visit (HOSPITAL_COMMUNITY): Payer: Self-pay | Admitting: Hematology

## 2021-12-17 DIAGNOSIS — D508 Other iron deficiency anemias: Secondary | ICD-10-CM

## 2021-12-24 ENCOUNTER — Encounter: Payer: Self-pay | Admitting: Family Medicine

## 2021-12-27 ENCOUNTER — Other Ambulatory Visit: Payer: Self-pay

## 2021-12-27 DIAGNOSIS — Z13220 Encounter for screening for lipoid disorders: Secondary | ICD-10-CM

## 2022-01-04 DIAGNOSIS — Z13220 Encounter for screening for lipoid disorders: Secondary | ICD-10-CM | POA: Diagnosis not present

## 2022-01-04 DIAGNOSIS — Z0000 Encounter for general adult medical examination without abnormal findings: Secondary | ICD-10-CM | POA: Diagnosis not present

## 2022-01-05 LAB — CMP14+EGFR
ALT: 13 IU/L (ref 0–32)
AST: 17 IU/L (ref 0–40)
Albumin/Globulin Ratio: 1.1 — ABNORMAL LOW (ref 1.2–2.2)
Albumin: 3.6 g/dL — ABNORMAL LOW (ref 3.8–4.9)
Alkaline Phosphatase: 71 IU/L (ref 44–121)
BUN/Creatinine Ratio: 13 (ref 9–23)
BUN: 9 mg/dL (ref 6–24)
Bilirubin Total: <0.2 mg/dL (ref 0.0–1.2)
CO2: 26 mmol/L (ref 20–29)
Calcium: 9 mg/dL (ref 8.7–10.2)
Chloride: 102 mmol/L (ref 96–106)
Creatinine, Ser: 0.68 mg/dL (ref 0.57–1.00)
Globulin, Total: 3.3 g/dL (ref 1.5–4.5)
Glucose: 78 mg/dL (ref 70–99)
Potassium: 4.7 mmol/L (ref 3.5–5.2)
Sodium: 142 mmol/L (ref 134–144)
Total Protein: 6.9 g/dL (ref 6.0–8.5)
eGFR: 105 mL/min/{1.73_m2} (ref >59)

## 2022-01-05 LAB — LIPID PANEL
Chol/HDL Ratio: 2.2 ratio (ref 0.0–4.4)
Cholesterol, Total: 149 mg/dL (ref 100–199)
HDL: 68 mg/dL (ref >39)
LDL Chol Calc (NIH): 62 mg/dL (ref 0–99)
Triglycerides: 107 mg/dL (ref 0–149)
VLDL Cholesterol Cal: 19 mg/dL (ref 5–40)

## 2022-01-05 LAB — TSH: TSH: 1.01 u[IU]/mL (ref 0.450–4.500)

## 2022-01-06 ENCOUNTER — Ambulatory Visit (INDEPENDENT_AMBULATORY_CARE_PROVIDER_SITE_OTHER): Payer: Medicare HMO | Admitting: Family Medicine

## 2022-01-06 ENCOUNTER — Encounter: Payer: Self-pay | Admitting: Family Medicine

## 2022-01-06 ENCOUNTER — Telehealth: Payer: Self-pay | Admitting: Family Medicine

## 2022-01-06 VITALS — BP 129/75 | HR 75 | Ht 67.0 in | Wt 178.0 lb

## 2022-01-06 DIAGNOSIS — Z23 Encounter for immunization: Secondary | ICD-10-CM | POA: Diagnosis not present

## 2022-01-06 DIAGNOSIS — Z0001 Encounter for general adult medical examination with abnormal findings: Secondary | ICD-10-CM

## 2022-01-06 DIAGNOSIS — N951 Menopausal and female climacteric states: Secondary | ICD-10-CM | POA: Insufficient documentation

## 2022-01-06 DIAGNOSIS — R69 Illness, unspecified: Secondary | ICD-10-CM | POA: Diagnosis not present

## 2022-01-06 DIAGNOSIS — Z0000 Encounter for general adult medical examination without abnormal findings: Secondary | ICD-10-CM

## 2022-01-06 DIAGNOSIS — F321 Major depressive disorder, single episode, moderate: Secondary | ICD-10-CM | POA: Diagnosis not present

## 2022-01-06 MED ORDER — VENLAFAXINE HCL ER 37.5 MG PO CP24: 37.5000 mg | ORAL_CAPSULE | Freq: Every day | ORAL | 3 refills | Status: DC

## 2022-01-06 MED ORDER — CYCLOBENZAPRINE HCL 10 MG PO TABS
10.0000 mg | ORAL_TABLET | Freq: Every day | ORAL | 5 refills | Status: DC
Start: 2022-01-06 — End: 2022-07-11

## 2022-01-06 NOTE — Progress Notes (Signed)
    [name]     MRN: [id number]      DOB: 07-22-1970  HPI: Patient is in for annual physical exam. LMP 09/2021,  experiencing mood swings, hot flashes and low libido. Recent labs,  are reviewed. Immunization is reviewed , and  updated    PE: BP 129/75 (BP Location: Right Arm, Patient Position: Sitting, Cuff Size: Large)   Pulse 75   Ht 5' 7"  (1.702 m)   Wt 178 lb (80.7 kg)   SpO2 98%   BMI 27.88 kg/m   Pleasant  female, alert and oriented x 3, in no cardio-pulmonary distress. Afebrile. HEENT No facial trauma or asymetry. Sinuses non tender.  Extra occullar muscles intact.. External ears normal, . Neck: supple, no adenopathy,JVD or thyromegaly.No bruits.  Chest: Clear to ascultation bilaterally.No crackles or wheezes. Non tender to palpation  Cardiovascular system; Heart sounds normal,  S1 and  S2 ,no S3.  No murmur, or thrill. Apical beat not displaced Peripheral pulses normal.     Musculoskeletal exam: Full ROM of spine, hips , shoulders and knees. No deformity ,swelling or crepitus noted. No muscle wasting or atrophy.   Neurologic: Cranial nerves 2 to 12 intact. Power, tone ,sensation and reflexes normal throughout. No disturbance in gait. No tremor.  Skin: Intact, no ulceration, erythema , scaling or rash noted. Pigmentation normal throughout  Psych; Normal mood and affect. Judgement and concentration normal   Assessment & Plan:  Annual physical exam Annual exam as documented. Counseling done  re healthy lifestyle involving commitment to 150 minutes exercise per week, heart healthy diet, and attaining healthy weight.The importance of adequate sleep also discussed. Regular seat belt use and home safety, is also discussed. Changes in health habits are decided on by the patient with goals and time frames  set for achieving them. Immunization and cancer screening needs are specifically addressed at this visit.   Depression, major, single  episode, moderate ([place]) Start effexor and refer to therapy  Perimenopause New hot flashes, mood swings, poor libido, start effexor

## 2022-01-06 NOTE — Telephone Encounter (Signed)
Pt returning call to [facility]

## 2022-01-06 NOTE — Assessment & Plan Note (Signed)
New hot flashes, mood swings, poor libido, start effexor

## 2022-01-06 NOTE — Assessment & Plan Note (Signed)

## 2022-01-06 NOTE — Assessment & Plan Note (Signed)
Start effexor and refer to therapy

## 2022-01-06 NOTE — Patient Instructions (Addendum)
F/U early January, call if you need me sooner  New for hot flashes and depression is effexor and you are referred to Therapist  Flu vaccine today  Insomnia Insomnia is a sleep disorder that makes it difficult to fall asleep or stay asleep. Insomnia can cause fatigue, low energy, difficulty concentrating, mood swings, and poor performance at work or school. There are three different ways to classify insomnia: Difficulty falling asleep. Difficulty staying asleep. Waking up too early in the morning. Any type of insomnia can be long-term (chronic) or short-term (acute). Both are common. Short-term insomnia usually lasts for 3 months or less. Chronic insomnia occurs at least three times a week for longer than 3 months. What are the causes? Insomnia may be caused by another condition, situation, or substance, such as: Having certain mental health conditions, such as anxiety and depression. Using caffeine, alcohol, tobacco, or drugs. Having gastrointestinal conditions, such as gastroesophageal reflux disease (GERD). Having certain medical conditions. These include: Asthma. Alzheimer's disease. Stroke. Chronic pain. An overactive thyroid gland (hyperthyroidism). Other sleep disorders, such as restless legs syndrome and sleep apnea. Menopause. Sometimes, the cause of insomnia may not be known. What increases the risk? Risk factors for insomnia include: Gender. Females are affected more often than males. Age. Insomnia is more common as people get older. Stress and certain medical and mental health conditions. Lack of exercise. Having an irregular work schedule. This may include working night shifts and traveling between different time zones. What are the signs or symptoms? If you have insomnia, the main symptom is having trouble falling asleep or having trouble staying asleep. This may lead to other symptoms, such as: Feeling tired or having low energy. Feeling nervous about going to  sleep. Not feeling rested in the morning. Having trouble concentrating. Feeling irritable, anxious, or depressed. How is this diagnosed? This condition may be diagnosed based on: Your symptoms and medical history. Your health care provider may ask about: Your sleep habits. Any medical conditions you have. Your mental health. A physical exam. How is this treated? Treatment for insomnia depends on the cause. Treatment may focus on treating an underlying condition that is causing the insomnia. Treatment may also include: Medicines to help you sleep. Counseling or therapy. Lifestyle adjustments to help you sleep better. Follow these instructions at home: Eating and drinking  Limit or avoid alcohol, caffeinated beverages, and products that contain nicotine and tobacco, especially close to bedtime. These can disrupt your sleep. Do not eat a large meal or eat spicy foods right before bedtime. This can lead to digestive discomfort that can make it hard for you to sleep. Sleep habits  Keep a sleep diary to help you and your health care provider figure out what could be causing your insomnia. Write down: When you sleep. When you wake up during the night. How well you sleep and how rested you feel the next day. Any side effects of medicines you are taking. What you eat and drink. Make your bedroom a dark, comfortable place where it is easy to fall asleep. Put up shades or blackout curtains to block light from outside. Use a white noise machine to block noise. Keep the temperature cool. Limit screen use before bedtime. This includes: Not watching TV. Not using your smartphone, tablet, or computer. Stick to a routine that includes going to bed and waking up at the same times every day and night. This can help you fall asleep faster. Consider making a quiet activity, such as reading, part  of your nighttime routine. Try to avoid taking naps during the day so that you sleep better at night. Get  out of bed if you are still awake after 15 minutes of trying to sleep. Keep the lights down, but try reading or doing a quiet activity. When you feel sleepy, go back to bed. General instructions Take over-the-counter and prescription medicines only as told by your health care provider. Exercise regularly as told by your health care provider. However, avoid exercising in the hours right before bedtime. Use relaxation techniques to manage stress. Ask your health care provider to suggest some techniques that may work well for you. These may include: Breathing exercises. Routines to release muscle tension. Visualizing peaceful scenes. Make sure that you drive carefully. Do not drive if you feel very sleepy. Keep all follow-up visits. This is important. Contact a health care provider if: You are tired throughout the day. You have trouble in your daily routine due to sleepiness. You continue to have sleep problems, or your sleep problems get worse. Get help right away if: You have thoughts about hurting yourself or someone else. Get help right away if you feel like you may hurt yourself or others, or have thoughts about taking your own life. Go to your nearest emergency room or: Call 911. Call the [facility] at [phone] or 988. This is open 24 hours a day. Text the Crisis Text Line at 639 357 6276. Summary Insomnia is a sleep disorder that makes it difficult to fall asleep or stay asleep. Insomnia can be long-term (chronic) or short-term (acute). Treatment for insomnia depends on the cause. Treatment may focus on treating an underlying condition that is causing the insomnia. Keep a sleep diary to help you and your health care provider figure out what could be causing your insomnia. This information is not intended to replace advice given to you by your health care provider. Make sure you discuss any questions you have with your health care provider. Document Revised:  02/01/2021 Document Reviewed: 02/01/2021 Elsevier Patient Education  [place].

## 2022-01-11 NOTE — Telephone Encounter (Signed)
Spoke to patient, she has what she needs

## 2022-01-12 ENCOUNTER — Encounter: Payer: Self-pay | Admitting: Internal Medicine

## 2022-01-19 ENCOUNTER — Ambulatory Visit (INDEPENDENT_AMBULATORY_CARE_PROVIDER_SITE_OTHER): Payer: Medicare HMO | Admitting: Licensed Clinical Social Worker

## 2022-01-19 DIAGNOSIS — F439 Reaction to severe stress, unspecified: Secondary | ICD-10-CM

## 2022-01-19 DIAGNOSIS — R69 Illness, unspecified: Secondary | ICD-10-CM | POA: Diagnosis not present

## 2022-01-21 ENCOUNTER — Other Ambulatory Visit: Payer: Self-pay | Admitting: Family Medicine

## 2022-01-21 DIAGNOSIS — D509 Iron deficiency anemia, unspecified: Secondary | ICD-10-CM

## 2022-01-24 NOTE — BH Specialist Note (Signed)
Integrated Behavioral Health via Telemedicine Visit  01/24/2022 [name] [id number]  Number of Integrated Behavioral Health Clinician visits: 1 Session Start time:  245pm Session End time: 320pm Total time in minutes: 35 mins in person at [facility]  Referring Provider: Dr. Moshe Cipro MD Patient/Family location: Home  [facility] Provider location: [place]  All persons participating in visit: [name] and LCSW A.Nygeria Lager Types of Service: Individual psychotherapy and Video visit  I connected with [name] and/or [name]'s n/a via  Telephone or Video Enabled Telemedicine Application  (Video is Caregility application) and verified that I am speaking with the correct person using two identifiers. Discussed confidentiality: Yes   I discussed the limitations of telemedicine and the availability of in person appointments.  Discussed there is a possibility of technology failure and discussed alternative modes of communication if that failure occurs.  I discussed that engaging in this telemedicine visit, they consent to the provision of behavioral healthcare and the services will be billed under their insurance.  Patient and/or legal guardian expressed understanding and consented to Telemedicine visit: Yes   Presenting Concerns: Patient and/or family reports the following symptoms/concerns: situational stress  Duration of problem: approx one month; Severity of problem: mild  Patient and/or Family's Strengths/Protective Factors: Concrete supports in place (healthy food, safe environments, etc.)  Goals Addressed: Patient will:  Reduce symptoms of: depression   Increase knowledge and/or ability of: coping skills   Demonstrate ability to: Increase healthy adjustment to current life circumstances  Progress towards Goals: Ongoing  Interventions: Interventions utilized:  Supportive Counseling Standardized Assessments completed: Not Needed  Patient and/or Family Response: [name] reports stress due to family conflict   Assessment: Patient currently experiencing situational stress.   Patient may benefit from community outpatient bh.  Plan: Follow up with behavioral health clinician on : as needed Behavioral recommendations: communicate needs with family for added support Referral(s): [place] (LME/Outside Clinic)  I discussed the assessment and treatment plan with the patient and/or parent/guardian. They were provided an opportunity to ask questions and all were answered. They agreed with the plan and demonstrated an understanding of the instructions.   They were advised to call back or seek an in-person evaluation if the symptoms worsen or if the condition fails to improve as anticipated.  Lynnea Ferrier, LCSW

## 2022-02-09 ENCOUNTER — Ambulatory Visit (INDEPENDENT_AMBULATORY_CARE_PROVIDER_SITE_OTHER): Payer: Medicare HMO | Admitting: Licensed Clinical Social Worker

## 2022-02-09 ENCOUNTER — Encounter (HOSPITAL_COMMUNITY): Payer: Self-pay | Admitting: Hematology

## 2022-02-09 DIAGNOSIS — F439 Reaction to severe stress, unspecified: Secondary | ICD-10-CM | POA: Diagnosis not present

## 2022-02-09 DIAGNOSIS — R69 Illness, unspecified: Secondary | ICD-10-CM | POA: Diagnosis not present

## 2022-02-12 NOTE — BH Specialist Note (Signed)
Integrated Behavioral Health via Telemedicine Visit  02/12/2022 CLOMA RAHRIG [id number]  Number of Integrated Behavioral Health Clinician visits: 2 Session Start time:  2:40pm Session End time: 3:00pm Total time in minutes: 23mns via mychart   Referring Provider: Dr SMoshe Cipro [facility] All persons participating in visit: Pt C Pinckney and LCSW A. Briell Paulette  Types of Service: Individual psychotherapy and Video visit  I connected with CLuther Parodyand/or Sarabi A Obey's n/a via  Telephone or Video Enabled Telemedicine Application  (Video is Caregility application) and verified that I am speaking with the correct person using two identifiers. Discussed confidentiality: Yes   I discussed the limitations of telemedicine and the availability of in person appointments.  Discussed there is a possibility of technology failure and discussed alternative modes of communication if that failure occurs.  I discussed that engaging in this telemedicine visit, they consent to the provision of behavioral healthcare and the services will be billed under their insurance.  Patient and/or legal guardian expressed understanding and consented to Telemedicine visit: Yes   Presenting Concerns: Patient and/or family reports the following symptoms/concerns: situational stress  Duration of problem: approx two months ; Severity of problem: mild  Patient and/or Family's Strengths/Protective Factors: Concrete supports in place (healthy food, safe environments, etc.)  Goals Addressed: Patient will:  Reduce symptoms of: depression   Increase knowledge and/or ability of: coping skills   Demonstrate ability to: Increase healthy adjustment to current life circumstances  Progress towards Goals: Ongoing  Interventions: Interventions utilized:  Supportive Counseling Standardized Assessments completed: Not Needed  Patient and/or Family Response: Ms. GOppedisano responded well to mychart visit. Ms. GLeasonreports mood and stress have improved.   Assessment: Patient experienced situational stress.   Patient may benefit from integrated behavioral health.  Plan: Follow up with behavioral health clinician on : as needed.  Behavioral recommendations: communicate needs with family for added support  [place]erral[place])  I discussed the assessment and treatment plan with the patient and/or parent/guardian. They were provided an opportunity to ask questions and all were answered. They agreed with the plan and demonstrated an understanding of the instructions.   They were advised to call back or seek an in-person evaluation if the symptoms worsen or if the condition fails to improve as anticipated.  ALynnea Ferrier LCSW

## 2022-03-01 DIAGNOSIS — L309 Dermatitis, unspecified: Secondary | ICD-10-CM | POA: Diagnosis not present

## 2022-03-01 DIAGNOSIS — E876 Hypokalemia: Secondary | ICD-10-CM | POA: Diagnosis not present

## 2022-03-01 DIAGNOSIS — I10 Essential (primary) hypertension: Secondary | ICD-10-CM | POA: Diagnosis not present

## 2022-03-01 DIAGNOSIS — G4733 Obstructive sleep apnea (adult) (pediatric): Secondary | ICD-10-CM | POA: Diagnosis not present

## 2022-03-01 DIAGNOSIS — K219 Gastro-esophageal reflux disease without esophagitis: Secondary | ICD-10-CM | POA: Diagnosis not present

## 2022-03-01 DIAGNOSIS — D84821 Immunodeficiency due to drugs: Secondary | ICD-10-CM | POA: Diagnosis not present

## 2022-03-01 DIAGNOSIS — J45909 Unspecified asthma, uncomplicated: Secondary | ICD-10-CM | POA: Diagnosis not present

## 2022-03-01 DIAGNOSIS — D509 Iron deficiency anemia, unspecified: Secondary | ICD-10-CM | POA: Diagnosis not present

## 2022-03-01 DIAGNOSIS — M1990 Unspecified osteoarthritis, unspecified site: Secondary | ICD-10-CM | POA: Diagnosis not present

## 2022-03-01 DIAGNOSIS — R69 Illness, unspecified: Secondary | ICD-10-CM | POA: Diagnosis not present

## 2022-03-01 DIAGNOSIS — E209 Hypoparathyroidism, unspecified: Secondary | ICD-10-CM | POA: Diagnosis not present

## 2022-03-15 ENCOUNTER — Ambulatory Visit (INDEPENDENT_AMBULATORY_CARE_PROVIDER_SITE_OTHER): Payer: Medicare HMO | Admitting: Family Medicine

## 2022-03-15 ENCOUNTER — Encounter: Payer: Self-pay | Admitting: Family Medicine

## 2022-03-15 VITALS — BP 110/80 | HR 94 | Ht 67.0 in | Wt 176.1 lb

## 2022-03-15 DIAGNOSIS — J3089 Other allergic rhinitis: Secondary | ICD-10-CM | POA: Diagnosis not present

## 2022-03-15 DIAGNOSIS — F321 Major depressive disorder, single episode, moderate: Secondary | ICD-10-CM

## 2022-03-15 DIAGNOSIS — R69 Illness, unspecified: Secondary | ICD-10-CM | POA: Diagnosis not present

## 2022-03-15 DIAGNOSIS — M542 Cervicalgia: Secondary | ICD-10-CM | POA: Diagnosis not present

## 2022-03-15 DIAGNOSIS — F5104 Psychophysiologic insomnia: Secondary | ICD-10-CM

## 2022-03-15 DIAGNOSIS — E663 Overweight: Secondary | ICD-10-CM | POA: Diagnosis not present

## 2022-03-15 DIAGNOSIS — G4700 Insomnia, unspecified: Secondary | ICD-10-CM | POA: Insufficient documentation

## 2022-03-15 DIAGNOSIS — M5090 Cervical disc disorder, unspecified, unspecified cervical region: Secondary | ICD-10-CM | POA: Insufficient documentation

## 2022-03-15 MED ORDER — HYDROXYZINE PAMOATE 25 MG PO CAPS: ORAL_CAPSULE | ORAL | 3 refills | Status: DC

## 2022-03-15 NOTE — Assessment & Plan Note (Signed)
  Patient re-educated about  the importance of commitment to a  minimum of 150 minutes of exercise per week as able.  The importance of healthy food choices with portion control discussed, as well as eating regularly and within a 12 hour window most days. The need to choose "clean , green" food 50 to 75% of the time is discussed, as well as to make water the primary drink and set a goal of 64 ounces water daily.       03/15/2022    9:51 AM 01/06/2022   10:47 AM 09/06/2021   10:48 AM  Weight /BMI  Weight 176 lb 1.9 oz 178 lb 177 lb  Height '5\' 7"'$  (1.702 m) '5\' 7"'$  (1.702 m) 5' 7.5" (1.715 m)  BMI 27.58 kg/m2 27.88 kg/m2 27.31 kg/m2

## 2022-03-15 NOTE — Assessment & Plan Note (Signed)
Treated with cuiurrent med, continue same

## 2022-03-15 NOTE — Assessment & Plan Note (Signed)
Recommend tylenol and topical rubs also neck exercises , pain likely due to arthritis

## 2022-03-15 NOTE — Progress Notes (Signed)
   [name]     MRN: [id number]      DOB: January 02, 1971   HPI [name] is here for follow up and re-evaluation of chronic medical conditions, in particular  anxiety and depression, medication management and review of any available recent lab and radiology data.  Preventive health is updated, specifically  Cancer screening and Immunization.   Questions or concerns regarding consultations or procedures which the PT has had in the interim are  addressed. The PT denies any adverse reactions to current medications since the last visit.  C/o increased generalized arthritic pain as expected with weather change ROS Denies recent fever or chills. Denies sinus pressure, nasal congestion, ear pain or sore throat. Denies chest congestion, productive cough or wheezing. Denies chest pains, palpitations and leg swelling Denies abdominal pain, nausea, vomiting,diarrhea or constipation.   Denies dysuria, frequency, hesitancy or incontinence. . Denies headaches, seizures, numbness, or tingling. Denies depression, anxiety marked Improvement on medication,does c/o insomnia.   PE  BP 110/80   Pulse 94   Ht '5\' 7"'$  (1.702 m)   Wt 176 lb 1.9 oz (79.9 kg)   SpO2 96%   BMI 27.58 kg/m   Patient alert and oriented and in no cardiopulmonary distress.  HEENT: No facial asymmetry, EOMI,     Neck supple .  Chest: Clear to auscultation bilaterally.  CVS: S1, S2 no murmurs, no S3.Regular rate.  ABD: Soft non tender.   Ext: No edema  MS: Adequate ROM spine, shoulders, hips and knees.  Skin: Intact, no ulcerations or rash noted.  Psych: Good eye contact, normal affect. Memory intact not anxious or depressed appearing.  CNS: CN 2-12 intact, power,  normal throughout.no focal deficits noted.   Assessment & Plan  Depression, major, single episode, moderate (HCC) Treated with cuiurrent med, continue same  Insomnia Sleep hygiene reviewed and written information offered also. Prescription sent  for  medication needed. Start hydroxyzine 25 to 50 mg at bedtime also behavior modification  Neck pain Recommend tylenol and topical rubs also neck exercises , pain likely due to arthritis  Allergic rhinitis Controlled, no change in medication   Overweight (BMI 25.0-29.9)  Patient re-educated about  the importance of commitment to a  minimum of 150 minutes of exercise per week as able.  The importance of healthy food choices with portion control discussed, as well as eating regularly and within a 12 hour window most days. The need to choose "clean , green" food 50 to 75% of the time is discussed, as well as to make water the primary drink and set a goal of 64 ounces water daily.       03/15/2022    9:51 AM 01/06/2022   10:47 AM 09/06/2021   10:48 AM  Weight /BMI  Weight 176 lb 1.9 oz 178 lb 177 lb  Height '5\' 7"'$  (1.702 m) '5\' 7"'$  (1.702 m) 5' 7.5" (1.715 m)  BMI 27.58 kg/m2 27.88 kg/m2 27.31 kg/m2

## 2022-03-15 NOTE — Assessment & Plan Note (Signed)
Sleep hygiene reviewed and written information offered also. Prescription sent for  medication needed. Start hydroxyzine 25 to 50 mg at bedtime also behavior modification

## 2022-03-15 NOTE — Patient Instructions (Signed)
F/u in 5 months, call if you need me sooner  New medication for sleep along with great sleep hygiene  Increase exercise to 5 days per week  Meditation and Spiritual health are very important  Thanks for choosing [facility], we consider it a privelige to serve you.

## 2022-03-15 NOTE — Assessment & Plan Note (Signed)
Controlled, no change in medication  

## 2022-04-08 ENCOUNTER — Inpatient Hospital Stay: Payer: Medicare HMO | Attending: Hematology

## 2022-04-08 DIAGNOSIS — K219 Gastro-esophageal reflux disease without esophagitis: Secondary | ICD-10-CM | POA: Insufficient documentation

## 2022-04-08 DIAGNOSIS — D72829 Elevated white blood cell count, unspecified: Secondary | ICD-10-CM | POA: Diagnosis not present

## 2022-04-08 DIAGNOSIS — E559 Vitamin D deficiency, unspecified: Secondary | ICD-10-CM | POA: Diagnosis not present

## 2022-04-08 DIAGNOSIS — R5383 Other fatigue: Secondary | ICD-10-CM | POA: Insufficient documentation

## 2022-04-08 DIAGNOSIS — Z818 Family history of other mental and behavioral disorders: Secondary | ICD-10-CM | POA: Insufficient documentation

## 2022-04-08 DIAGNOSIS — D509 Iron deficiency anemia, unspecified: Secondary | ICD-10-CM | POA: Insufficient documentation

## 2022-04-08 DIAGNOSIS — Z87891 Personal history of nicotine dependence: Secondary | ICD-10-CM | POA: Diagnosis not present

## 2022-04-08 DIAGNOSIS — Z814 Family history of other substance abuse and dependence: Secondary | ICD-10-CM | POA: Diagnosis not present

## 2022-04-08 DIAGNOSIS — D75839 Thrombocytosis, unspecified: Secondary | ICD-10-CM | POA: Diagnosis not present

## 2022-04-08 DIAGNOSIS — Z79899 Other long term (current) drug therapy: Secondary | ICD-10-CM | POA: Diagnosis not present

## 2022-04-08 DIAGNOSIS — Z836 Family history of other diseases of the respiratory system: Secondary | ICD-10-CM | POA: Insufficient documentation

## 2022-04-08 DIAGNOSIS — K5090 Crohn's disease, unspecified, without complications: Secondary | ICD-10-CM | POA: Insufficient documentation

## 2022-04-08 DIAGNOSIS — D508 Other iron deficiency anemias: Secondary | ICD-10-CM

## 2022-04-08 LAB — CBC WITH DIFFERENTIAL/PLATELET
Abs Immature Granulocytes: 0.03 10*3/uL (ref 0.00–0.07)
Basophils Absolute: 0.1 10*3/uL (ref 0.0–0.1)
Basophils Relative: 1 %
Eosinophils Absolute: 0.4 10*3/uL (ref 0.0–0.5)
Eosinophils Relative: 4 %
HCT: 36.4 % (ref 36.0–46.0)
Hemoglobin: 11.8 g/dL — ABNORMAL LOW (ref 12.0–15.0)
Immature Granulocytes: 0 %
Lymphocytes Relative: 40 %
Lymphs Abs: 3.5 10*3/uL (ref 0.7–4.0)
MCH: 27.6 pg (ref 26.0–34.0)
MCHC: 32.4 g/dL (ref 30.0–36.0)
MCV: 85.2 fL (ref 80.0–100.0)
Monocytes Absolute: 0.6 10*3/uL (ref 0.1–1.0)
Monocytes Relative: 6 %
Neutro Abs: 4.2 10*3/uL (ref 1.7–7.7)
Neutrophils Relative %: 49 %
Platelets: 446 10*3/uL — ABNORMAL HIGH (ref 150–400)
RBC: 4.27 MIL/uL (ref 3.87–5.11)
RDW: 15.8 % — ABNORMAL HIGH (ref 11.5–15.5)
WBC: 8.7 10*3/uL (ref 4.0–10.5)
nRBC: 0 % (ref 0.0–0.2)

## 2022-04-08 LAB — IRON AND TIBC
Iron: 85 ug/dL (ref 28–170)
Saturation Ratios: 29 % (ref 10.4–31.8)
TIBC: 292 ug/dL (ref 250–450)
UIBC: 207 ug/dL

## 2022-04-08 LAB — FERRITIN: Ferritin: 96 ng/mL (ref 11–307)

## 2022-04-09 LAB — MISC LABCORP TEST (SEND OUT): Labcorp test code: 81950

## 2022-04-14 NOTE — Progress Notes (Signed)
VIRTUAL VISIT via [place]   I connected with [name]  on 04/15/2022 at 1:05 PM by telephone and verified that I am speaking with the correct person using two identifiers.  Location: Patient: Home Provider: [facility]   I discussed the limitations, risks, security and privacy concerns of performing an evaluation and management service by telephone and the availability of in person appointments. I also discussed with the patient that there may be a patient responsible charge related to this service. The patient expressed understanding and agreed to proceed.  REASON FOR VISIT: Iron deficiency anemia and reactive leukocytosis/thrombocytosis  CURRENT THERAPY: Oral iron supplementation with intermittent IV iron as needed  INTERVAL HISTORY:  Ms. [name] is contacted today for follow-up of her iron deficiency anemia and reactive leukocytosis/thrombocytosis.  She was last evaluated via telemedicine visit by Tarri Abernethy PA-C on 10/01/2021.  At today's visit, she reports feeling well.  No recent hospitalizations, surgeries, or changes in baseline health status.   She reports that she is going through menopause with last menstrual period about 3 to 4 months ago.  She denies any signs of GI blood loss such as hematochezia or melena.   She reports ongoing fatigue with energy about 70%, which she relates to poor quality of sleep at night, reports that she wakes up and has a hard time falling back to sleep.  She admits to occasional headaches.  She denies any pica, restless legs, chest pain, dyspnea on exertion, lightheadedness, or syncope.  She continues to take her daily oral iron supplementation and is tolerating it well.   Regarding her leukocytosis and thrombocytosis, she has not had any recent steroids or infections.  No B symptoms such as fever, chills, night sweats, unintentional weight loss.   She has 70% energy and 100% appetite.  She endorses that she is maintaining a stable weight.   REVIEW OF SYSTEMS:   Review of Systems  Constitutional:  Negative for chills, diaphoresis, fever, malaise/fatigue and weight loss.  Respiratory:  Negative for cough and shortness of breath.   Cardiovascular:  Negative for chest pain and palpitations.  Gastrointestinal:  Negative for abdominal pain, blood in stool, melena, nausea and vomiting.  Neurological:  Positive for dizziness, tingling and headaches.  Psychiatric/Behavioral:  Positive for depression. The patient is nervous/anxious and has insomnia.      PHYSICAL EXAM: (per limitations of virtual telephone visit)  The patient is alert and oriented x 3, exhibiting adequate mentation, good mood, and ability to speak in full sentences and execute sound judgement.  ASSESSMENT & PLAN:  1.  Iron deficiency anemia: - Prior history of menorrhagia, but patient is now perimenopausal, with LMP in fall 2023 - She has underlying Crohn's disease, but denies any recent GI blood loss - Colonoscopy (05/10/2021) Proximal colon with suspected surgical changes, small nodule and adjacent small ulcer biopsied, possibly contributing to IDA - pathology showed inflammation, no malignancy. - She is on weekly Humira for Crohn's - Last Venofer was on 09/11/2019. - She has been taking oral iron supplement (Niferex) at home with improvement in her iron levels - Most recent labs (04/08/2022): Hgb 11.8/MCV 85.2, ferritin 96, iron saturation 29%.  Normal B12/methylmalonic acid, folate when checked in January 2023. - PLAN: Continue oral iron supplement.  No parenteral iron therapy needed at this time.  RTC in 1 year with labs the week before.     2.  Vitamin D deficiency: - Vitamin D  level is 39.0 (04/08/2022) - She is taking Vitamin D 50,000 units weekly  - PLAN: Continue vitamin D 50,000 units weekly.     3.  Leukocytosis / thrombocytosis: - She has had intermittent leukocytosis and thrombocytosis since at least  2008, associated with steroid use and previous infections   - Most recent CBC (04/08/2022): Normal WBC 8.7, mildly elevated platelets 446 - No recent steroid use or infections.  Former smoker, quit in 2013.  No B symptoms or unintentional weight loss. - Overall picture favors reactive leukocytosis and thrombocytosis in the setting of Crohn's disease.  Watchful waiting remains appropriate for the time being. - PLAN: Repeat CBC at follow-up visit in 1 year.  We will consider further work-up if any significant deviation from baseline.  PLAN SUMMARY: >> Labs in 1 year (CBC/D, ferritin, iron/TIBC, vitamin D, B12, MMA, folate) >> OFFICE visit in 1 year (one week after labs)     I discussed the assessment and treatment plan with the patient. The patient was provided an opportunity to ask questions and all were answered. The patient agreed with the plan and demonstrated an understanding of the instructions.   The patient was advised to call back or seek an in-person evaluation if the symptoms worsen or if the condition fails to improve as anticipated.  I provided 22 minutes of non-face-to-face time during this encounter.  Harriett Rush, PA-C 04/15/22 1:28 PM

## 2022-04-15 ENCOUNTER — Inpatient Hospital Stay (HOSPITAL_BASED_OUTPATIENT_CLINIC_OR_DEPARTMENT_OTHER): Payer: Medicare HMO | Admitting: Physician Assistant

## 2022-04-15 DIAGNOSIS — D75839 Thrombocytosis, unspecified: Secondary | ICD-10-CM | POA: Diagnosis not present

## 2022-04-15 DIAGNOSIS — D508 Other iron deficiency anemias: Secondary | ICD-10-CM | POA: Diagnosis not present

## 2022-04-15 DIAGNOSIS — E559 Vitamin D deficiency, unspecified: Secondary | ICD-10-CM | POA: Diagnosis not present

## 2022-04-18 ENCOUNTER — Other Ambulatory Visit: Payer: Self-pay

## 2022-04-18 DIAGNOSIS — D75839 Thrombocytosis, unspecified: Secondary | ICD-10-CM

## 2022-04-18 DIAGNOSIS — D508 Other iron deficiency anemias: Secondary | ICD-10-CM

## 2022-04-18 DIAGNOSIS — E559 Vitamin D deficiency, unspecified: Secondary | ICD-10-CM

## 2022-05-02 ENCOUNTER — Other Ambulatory Visit: Payer: Self-pay | Admitting: Family Medicine
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
## 2022-06-13 ENCOUNTER — Encounter: Payer: Self-pay | Admitting: Gastroenterology

## 2022-06-13 ENCOUNTER — Ambulatory Visit (INDEPENDENT_AMBULATORY_CARE_PROVIDER_SITE_OTHER): Payer: Medicare HMO | Admitting: Gastroenterology

## 2022-06-13 VITALS — BP 126/80 | HR 92 | Temp 97.6°F | Ht 67.5 in | Wt 182.0 lb

## 2022-06-13 DIAGNOSIS — K5080 Crohn's disease of both small and large intestine without complications: Secondary | ICD-10-CM | POA: Diagnosis not present

## 2022-06-13 DIAGNOSIS — K219 Gastro-esophageal reflux disease without esophagitis: Secondary | ICD-10-CM

## 2022-06-13 NOTE — Patient Instructions (Signed)
Please complete lab and stool test as discussed.  You will need to stop omeprazole for two weeks before completing your stool test. If you need something for heartburn during that time, you can use pepcid.  We will be in touch with results as available.

## 2022-06-17 LAB — HEPATITIS B CORE ANTIBODY, TOTAL: Hep B Core Total Ab: POSITIVE — AB

## 2022-06-17 LAB — QUANTIFERON-TB GOLD PLUS
QuantiFERON Mitogen Value: >10 IU/mL
QuantiFERON Nil Value: 0.29 IU/mL
QuantiFERON TB1 Ag Value: 0.89 IU/mL
QuantiFERON TB2 Ag Value: 0.67 IU/mL
QuantiFERON-TB Gold Plus: POSITIVE — AB

## 2022-06-17 LAB — HEPATITIS B SURFACE ANTIBODY,QUALITATIVE: Hep B Surface Ab, Qual: REACTIVE

## 2022-06-17 LAB — HEPATITIS B SURFACE ANTIGEN: Hepatitis B Surface Ag: NEGATIVE

## 2022-06-17 LAB — B12 AND FOLATE PANEL
Folate: 8.1 ng/mL (ref >3.0)
Vitamin B-12: >2000 pg/mL — ABNORMAL HIGH (ref 232–1245)

## 2022-06-24 ENCOUNTER — Other Ambulatory Visit: Payer: Self-pay

## 2022-06-24 DIAGNOSIS — R7612 Nonspecific reaction to cell mediated immunity measurement of gamma interferon antigen response without active tuberculosis: Secondary | ICD-10-CM

## 2022-06-24 DIAGNOSIS — R768 Other specified abnormal immunological findings in serum: Secondary | ICD-10-CM

## 2022-07-04 ENCOUNTER — Ambulatory Visit (HOSPITAL_COMMUNITY)
Admission: RE | Admit: 2022-07-04 | Discharge: 2022-07-04 | Disposition: A | Discharge status: Home / Self Care | Payer: Medicare HMO | Source: Ambulatory Visit | Attending: Gastroenterology | Admitting: Gastroenterology

## 2022-07-04 DIAGNOSIS — K5080 Crohn's disease of both small and large intestine without complications: Secondary | ICD-10-CM | POA: Diagnosis not present

## 2022-07-04 DIAGNOSIS — J410 Simple chronic bronchitis: Secondary | ICD-10-CM | POA: Diagnosis not present

## 2022-07-04 DIAGNOSIS — R7612 Nonspecific reaction to cell mediated immunity measurement of gamma interferon antigen response without active tuberculosis: Secondary | ICD-10-CM | POA: Insufficient documentation

## 2022-07-04 DIAGNOSIS — K219 Gastro-esophageal reflux disease without esophagitis: Secondary | ICD-10-CM | POA: Diagnosis not present

## 2022-07-04 DIAGNOSIS — R768 Other specified abnormal immunological findings in serum: Secondary | ICD-10-CM | POA: Diagnosis not present

## 2022-07-05 LAB — HEPATITIS B DNA, ULTRAQUANTITATIVE, PCR: HBV DNA SERPL PCR-ACNC: NOT DETECTED IU/mL

## 2022-07-06 LAB — H. PYLORI ANTIGEN, STOOL: H pylori Ag, Stl: NEGATIVE

## 2022-07-11 ENCOUNTER — Other Ambulatory Visit: Payer: Self-pay | Admitting: Family Medicine

## 2022-07-22 ENCOUNTER — Ambulatory Visit (INDEPENDENT_AMBULATORY_CARE_PROVIDER_SITE_OTHER): Payer: Medicare HMO | Admitting: Family Medicine

## 2022-07-22 VITALS — BP 146/84 | HR 115 | Ht 67.0 in | Wt 181.1 lb

## 2022-07-22 DIAGNOSIS — F4321 Adjustment disorder with depressed mood: Secondary | ICD-10-CM

## 2022-07-22 DIAGNOSIS — F319 Bipolar disorder, unspecified: Secondary | ICD-10-CM | POA: Diagnosis not present

## 2022-07-22 MED ORDER — TEMAZEPAM 7.5 MG PO CAPS: 7.5000 mg | ORAL_CAPSULE | Freq: Every evening | ORAL | 1 refills | Status: DC | PRN

## 2022-07-22 NOTE — Patient Instructions (Signed)
F/U as before , call if you need me sooner  Urgent referral for in house therapy, and also to Dr Lolly Mustache  Restoril is prescribed short term to help with sleep  Thanks for choosing [facility], we consider it a privelige to serve you.

## 2022-07-22 NOTE — Assessment & Plan Note (Signed)
52 y/o son committed suicide ran into a pond

## 2022-07-24 NOTE — Progress Notes (Signed)
   [name]     MRN: [id number]      DOB: 06-Dec-1970  Chief Complaint  Patient presents with   Follow-up    Discuss mental health recently lost son    HPI [name] is here with acute grief, having lost her 52 y/o son to suicide on this past Michele's day, 6 days ago. He was found in a pond in his car several days after he was last seen on 07/17/2022. She is absolutely devastated , in shock, trying to get a grip on the reality of what has happened, and feels as though she needs to be seen this week for help with her mental health Michele has a dx of bipolar illness, and has remained stable and done  very well for years with no medication , however , with this very traumatic event,there is real concern that she may have a breakdown, so I am referring her to her Psychiatrist as well as for therapy, she is appreciative of both She reports excellent support from family and friends, and goes on to say tha each family member s dealing with the situation "in their own way" She Is no suicidal or homicidal, and  states  that her son was schizophrenic and had been battling his situation the best way he could, from her report it appears that the suicide was planned Currently her sleep is very poor Prior to the event she had been stable and doing very well Patient and/or legal guardian verbally consented to [facility] services about presenting concerns and psychiatric consultation as appropriate.  The services will be billed as appropriate for the patient.    ROS No other concerns other than as stated above PE  BP (!) 146/84 (BP Location: Left Arm, Patient Position: Sitting, Cuff Size: Normal)   Pulse (!) 115   Ht 5\' 7"  (1.702 m)   Wt 181 lb 1.3 oz (82.1 kg)   SpO2 98%   BMI 28.36 kg/m   Patient alert and oriented and in no cardiopulmonary distress.  HEENT: No facial asymmetry, EOMI,     Neck supple .  Chest: Clear to auscultation bilaterally.  CVS: S1, S2 no  murmurs, no S3.Regular rate.  Ext: No edema  MS: Adequate ROM spine, shoulders, hips and knees.  Skin: Intact, no ulcerations or rash noted.  Psych: Good eye contact,tearful and sad affect CNS: CN 2-12 intact, power,  normal throughout.no focal deficits noted.   Assessment & Plan  Complicated grief 73 y/o son committed suicide ran into a pond on Michele's Day, 07/17/2022, [name] has h/o bipolar d/o and is now facing this challenge. Refer to Psych she has seen in the past as well as to therapist, she agrees to both. Remeron prescribed short term to help with sleep. Not currently suicidal or homicidal

## 2022-07-25 ENCOUNTER — Encounter: Payer: Self-pay | Admitting: Family Medicine

## 2022-07-25 MED ORDER — TEMAZEPAM 7.5 MG PO CAPS: 7.5000 mg | ORAL_CAPSULE | Freq: Every day | ORAL | 1 refills | Status: DC

## 2022-07-27 ENCOUNTER — Ambulatory Visit: Payer: Medicare HMO | Admitting: Family Medicine

## 2022-08-12 ENCOUNTER — Ambulatory Visit (INDEPENDENT_AMBULATORY_CARE_PROVIDER_SITE_OTHER): Payer: Medicare HMO | Admitting: Professional Counselor

## 2022-08-12 DIAGNOSIS — F4321 Adjustment disorder with depressed mood: Secondary | ICD-10-CM

## 2022-08-12 NOTE — Patient Instructions (Addendum)
Begin education on stages of grief.  Give yourself permission to be where you are in your grief process.  If your symptoms worsen or you have thoughts of suicide/homicide, PLEASE SEEK IMMEDIATE MEDICAL ATTENTION.  You may always call:   National Suicide Hotline: 988 or 3134591258 [place] Crisis Line: 980-175-2920 Crisis Recovery in [place]: [phone]     These are available 24 hours a day, 7 days a week.
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
## 2022-08-16 ENCOUNTER — Encounter: Payer: Self-pay | Admitting: Family Medicine

## 2022-08-16 ENCOUNTER — Ambulatory Visit (HOSPITAL_COMMUNITY)
Admission: RE | Admit: 2022-08-16 | Discharge: 2022-08-16 | Disposition: A | Discharge status: Home / Self Care | Payer: Medicare HMO | Source: Ambulatory Visit | Attending: Family Medicine | Admitting: Family Medicine

## 2022-08-16 ENCOUNTER — Ambulatory Visit (INDEPENDENT_AMBULATORY_CARE_PROVIDER_SITE_OTHER): Payer: Medicare HMO | Admitting: Family Medicine

## 2022-08-16 VITALS — BP 118/80 | HR 105 | Ht 67.0 in | Wt 182.0 lb

## 2022-08-16 DIAGNOSIS — M5442 Lumbago with sciatica, left side: Secondary | ICD-10-CM | POA: Diagnosis not present

## 2022-08-16 DIAGNOSIS — F5104 Psychophysiologic insomnia: Secondary | ICD-10-CM

## 2022-08-16 DIAGNOSIS — G8929 Other chronic pain: Secondary | ICD-10-CM | POA: Diagnosis not present

## 2022-08-16 DIAGNOSIS — M545 Low back pain, unspecified: Secondary | ICD-10-CM | POA: Diagnosis not present

## 2022-08-16 DIAGNOSIS — F4321 Adjustment disorder with depressed mood: Secondary | ICD-10-CM

## 2022-08-16 MED ORDER — METHYLPREDNISOLONE ACETATE 80 MG/ML IJ SUSP
80.0000 mg | Freq: Once | INTRAMUSCULAR | Status: AC
Start: 2022-08-16 — End: 2022-08-16
  Administered 2022-08-16: 80 mg via INTRAMUSCULAR

## 2022-08-16 MED ORDER — PREDNISONE 5 MG PO TABS: 5.0000 mg | ORAL_TABLET | Freq: Two times a day (BID) | ORAL | 0 refills | Status: AC

## 2022-08-16 NOTE — Patient Instructions (Addendum)
In 2 months, call if you need me sooner  X ray of back today  Depo medrol 80 mg IM in office today, to be followed by 5 day course of prednisone for sciatic pain  Continue all medications you are currently taking  It is important that you exercise regularly at least 30 minutes 5 times a week. If you develop chest pain, have severe difficulty breathing, or feel very tired, stop exercising immediately and seek medical attention   Think about what you will eat, plan ahead. Choose " clean, green, fresh or frozen" over canned, processed or packaged foods which are more sugary, salty and fatty. 70 to 75% of food eaten should be vegetables and fruit. Three meals at set times with snacks allowed between meals, but they must be fruit or vegetables. Aim to eat over a 12 hour period , example 7 am to 7 pm, and STOP after  your last meal of the day. Drink water,generally about 64 ounces per day, no other drink is as healthy. Fruit juice is best enjoyed in a healthy way, by EATING the fruit.  Thanks for choosing [facility], we consider it a privelige to serve you.

## 2022-08-19 ENCOUNTER — Encounter: Payer: Self-pay | Admitting: Family Medicine

## 2022-08-22 ENCOUNTER — Encounter: Payer: Self-pay | Admitting: Family Medicine

## 2022-08-22 DIAGNOSIS — G8929 Other chronic pain: Secondary | ICD-10-CM | POA: Insufficient documentation

## 2022-08-22 NOTE — Assessment & Plan Note (Signed)
Controlled, no change in medication  

## 2022-08-22 NOTE — Assessment & Plan Note (Signed)
Uncontrolled. depo medrol 80 mg administered IM in the office , to be followed by a short course of oral prednisone also x ray of lumbar spine

## 2022-08-22 NOTE — Assessment & Plan Note (Signed)
Good response to current medications, continus same , continue therapy, Psych eval pending,no current indication for urgent eval

## 2022-08-22 NOTE — Progress Notes (Signed)
   [name]     MRN: [id number]      DOB: [birth year]  Chief Complaint  Patient presents with   Follow-up    Follow up sciatic nerve pain r/l     HPI [name] is here with main c/o acute onset of low back pain radiating down left leg, no inciting trauma, initially could hardly move for 2 days, no new weakness or numbness or incontinence Is benefiting from therapy for acute grief, denies hallucinations or suicidal thoughts mod changes from one day to the next  as to  be expected   PE  BP 118/80 (BP Location: Right Arm, Patient Position: Sitting, Cuff Size: Large)   Pulse (!) 105   Ht 5\' 7"  (1.702 m)   Wt 182 lb 0.6 oz (82.6 kg)   SpO2 96%   BMI 28.51 kg/m   Patient alert and oriented and in no cardiopulmonary distress.  HEENT: No facial asymmetry, EOMI,     Neck supple .  Chest: Clear to auscultation bilaterally.  CVS: S1, S2 no murmurs, no S3.Regular rate.  ABD: Soft non tender.   Ext: No edema  MS: Decreased  ROM lumbar spine, adequate in  shoulders, hips and knees.  Skin: Intact, no ulcerations or rash noted.  Psych: Good eye contact, normal affect. Memory intact not anxious or depressed appearing.  CNS: CN 2-12 intact, power,  normal throughout.no focal deficits noted.   Assessment & Plan  Chronic bilateral low back pain with sciatica Uncontrolled. depo medrol 80 mg administered IM in the office , to be followed by a short course of oral prednisone also x ray of lumbar spine    Complicated grief Good response to current medications, continus same , continue therapy, Psych eval pending,no current indication for urgent eval  Insomnia Controlled, no change in medication

## 2022-08-24 ENCOUNTER — Telehealth (INDEPENDENT_AMBULATORY_CARE_PROVIDER_SITE_OTHER): Payer: Medicare HMO | Admitting: Professional Counselor

## 2022-08-24 DIAGNOSIS — F4321 Adjustment disorder with depressed mood: Secondary | ICD-10-CM | POA: Diagnosis not present

## 2022-08-24 NOTE — BH Specialist Note (Addendum)
Virtual Behavioral Health Treatment Plan Team Note  MRN: [id number] NAME: [name]  DATE: 08/24/22  Start time: Start Time: 0325 End time: Stop Time: 0340 Total time: Total Time in Minutes (Visit): 15  Total number of Virtual BH Treatment Team Plan encounters: 1/4  Treatment Team Attendees: Dr. Vanetta Shawl and Esmond Harps  Diagnoses: No diagnosis found.  Goals, Interventions and Follow-up Plan Goals: *** Interventions: Solution-Focused Strategies Mindfulness or Relaxation Training Medication Management Recommendations: Defer to Dr. Lolly Mustache Follow-up Plan: Continue weekly counseling  History of the present illness Presenting Problem/Current Symptoms: ***  Psychiatric History  Depression: {BHH YES OR NO:22294} Anxiety: {BHH YES OR NO:22294} Mania: {BHH YES OR NO:22294} Psychosis: {BHH YES OR NO:22294} PTSD symptoms: {BHH YES OR NO:22294}  Past Psychiatric History/Hospitalization(s): Hospitalization for psychiatric illness: {BHH YES OR NO:22294} Prior Suicide Attempts: {BHH YES OR NO:22294} Prior Self-injurious behavior: {BHH YES OR JO:84166}  Psychosocial stressors   Self-harm Behaviors Risk Assessment   Screenings PHQ-9 Assessments:     08/16/2022    9:45 AM 08/12/2022    9:27 AM 03/15/2022    9:52 AM  Depression screen PHQ 2/9  Decreased Interest 0 1 0  Down, Depressed, Hopeless 0 2 0  PHQ - 2 Score 0 3 0  Altered sleeping 0 3   Tired, decreased energy 0 2   Change in appetite 0 1   Feeling bad or failure about yourself  0 0   Trouble concentrating 0 1   Moving slowly or fidgety/restless 0 0   Suicidal thoughts 0 0   PHQ-9 Score 0 10    GAD-7 Assessments:     08/12/2022    9:27 AM 03/15/2022   10:19 AM 03/15/2022    9:52 AM 01/01/2021    2:04 PM  GAD 7 : Generalized Anxiety Score  Nervous, Anxious, on Edge 0 0 0 0  Control/stop worrying 1 0 0 0  Worry too much - different things 1 0 0 0  Trouble relaxing 1 1 1  0  Restless 0 0 0 0  Easily annoyed or  irritable 2 0 0 0  Afraid - awful might happen 0 0 0 0  Total GAD 7 Score 5 1 1  0  Anxiety Difficulty Somewhat difficult  Not difficult at all     Past Medical History Past Medical History:  Diagnosis Date   Allergic rhinitis    Anxiety    Arthritis    Asthma    Bipolar disorder (HCC)    DR ARFEEN/RODENBOUGH   Crohn's colitis (HCC) 05/15/2006   Qualifier: Diagnosis of  By: Jen Mow MD, Christine      Crohn's disease [facility]) 2001   treated with humira   Depression    Elevated WBC count    GERD (gastroesophageal reflux disease)    Hypokalemia    Iron deficiency anemia 08/28/2013   Secondary to Crohn's Disease and malabsorption from chronic PPI use.   Low back pain    Neck injuries    Peptic ulcer disease 2009   H pylori gastritis on EGD & gastric ulcer   S/P colonoscopy 06/01/2005   Dr patterson-Bx focal active ileitis   Sleep apnea    CPAP   Vitamin B12 deficiency anemia     Vital signs: There were no vitals filed for this visit.  Allergies:  Allergies as of 08/24/2022 - Review Complete 08/22/2022  Allergen Reaction Noted   Feraheme [ferumoxytol] Anaphylaxis 10/06/2015   Peanut-containing drug products Anaphylaxis 03/22/2011   Shellfish allergy Anaphylaxis 03/22/2011  Oxycodone-acetaminophen Itching and Swelling 12/29/2005   Tramadol hcl Itching     Medication History Current medications:  Outpatient Encounter Medications as of 08/24/2022  Medication Sig   acetaminophen (TYLENOL) 500 MG tablet Take 1,000 mg by mouth every 6 (six) hours as needed for mild pain.   calcium-vitamin D (OSCAL WITH D) 500-200 MG-UNIT tablet Take 1 tablet by mouth daily.   Cyanocobalamin (B-12) 1000 MCG CAPS Take 100 mcg by mouth daily.   cyclobenzaprine (FLEXERIL) 10 MG tablet TAKE 1 TABLET(10 MG) BY MOUTH AT BEDTIME   fluticasone (FLONASE) 50 MCG/ACT nasal spray SHAKE LIQUID AND USE 2 SPRAYS IN EACH NOSTRIL EVERY DAY (Patient taking differently: Place 1 spray into both nostrils daily as needed  for allergies.)   HUMIRA PEN 40 MG/0.8ML PNKT Inject 40 mg into the skin every Wednesday. Once weekly   hydrOXYzine (VISTARIL) 25 MG capsule TAKE 1 TO 2 CAPSULES BY MOUTH AT BEDTIME FOR SLEEP   iron polysaccharides (NIFEREX) 150 MG capsule TAKE 1 CAPSULE(150 MG) BY MOUTH DAILY   Multiple Vitamin (MULTIVITAMIN WITH MINERALS) TABS tablet Take 1 tablet by mouth daily.   Omega-3 Fatty Acids (FISH OIL) 1200 MG CAPS Take 1,200 mg by mouth daily.    omeprazole (PRILOSEC) 20 MG capsule TAKE ONE (1) CAPSULE BY MOUTH TWICE A DAY BEFORE A MEAL (Patient taking differently: Take 20 mg by mouth daily.)   potassium chloride SA (K-DUR,KLOR-CON) 20 MEQ tablet Take 1 tablet (20 mEq total) by mouth 2 (two) times daily. (Patient taking differently: Take 40 mEq by mouth daily.)   Probiotic Product (PROBIOTIC PO) Take 1 tablet by mouth daily.   temazepam (RESTORIL) 7.5 MG capsule Take 1 capsule (7.5 mg total) by mouth at bedtime.   venlafaxine XR (EFFEXOR-XR) 37.5 MG 24 hr capsule TAKE 1 CAPSULE(37.5 MG) BY MOUTH DAILY WITH BREAKFAST   vitamin C (ASCORBIC ACID) 500 MG tablet Take 500 mg by mouth daily.   Vitamin D, Ergocalciferol, (DRISDOL) 1.25 MG (50000 UNIT) CAPS capsule TAKE ONE CAPSULE BY MOUTH ONCE A WEEK   No facility-administered encounter medications on file as of 08/24/2022.     Scribe for Treatment Team: Reuel Boom

## 2022-08-26 ENCOUNTER — Ambulatory Visit (INDEPENDENT_AMBULATORY_CARE_PROVIDER_SITE_OTHER): Payer: Medicare HMO | Admitting: Professional Counselor

## 2022-08-26 ENCOUNTER — Encounter: Payer: Medicare HMO | Admitting: Family Medicine

## 2022-08-26 DIAGNOSIS — F4321 Adjustment disorder with depressed mood: Secondary | ICD-10-CM

## 2022-08-26 NOTE — BH Specialist Note (Signed)
Collaborative Care Follow-up  MRN: [id number] NAME: [name] Date: 08/26/22  Start time: Start Time: 1030 End time: Stop Time: 1100 Total time: Total Time in Minutes (Visit): 30 Call number: Visit Number: 3- Third Visit  Reason for call today:  The patient is a 52 year old female presenting for a collaborative care follow-up. She continues to grieve the recent loss of her son, who committed suicide. Today, she reports feeling very emotional, crying frequently, and experiencing intense emotions like anger. Patient is continuing to process her loss and is currently looking for closure. She feels that seeing the vehicle in which her son died is necessary for her to fully process and accept his death.  The patient reports seeking support through her church and friends during this difficult time. She describes experiencing a wide range of emotions as she navigates her grief. She has found the education on the grief process from her behavioral health counselor beneficial, understanding that the stages she is experiencing are normal and that she is where she needs to be in her grieving.  The patient has a follow-up appointment with her psychiatrist in approximately three weeks to discuss any necessary medication adjustments. She reports benefiting from counseling and appreciates the support she is receiving. She is scheduled to return for another session in two weeks. The plan is to continue learning about the grief process and exploring ways to find closure. [facility] has encouraged her to journal her thoughts and emotions to help process them in their next session.  PHQ-9 Scores:     08/26/2022   10:36 AM 08/16/2022    9:45 AM 08/12/2022    9:27 AM 03/15/2022    9:52 AM 01/06/2022   11:23 AM  Depression screen PHQ 2/9  Decreased Interest 1 0 1 0 2  Down, Depressed, Hopeless 1 0 2 0 2  PHQ - 2 Score 2 0 3 0 4  Altered sleeping 2 0 3  3  Tired, decreased energy 2 0 2  0  Change in appetite 0 0 1  0   Feeling bad or failure about yourself  0 0 0  0  Trouble concentrating 0 0 1  2  Moving slowly or fidgety/restless 0 0 0  1  Suicidal thoughts 0 0 0    PHQ-9 Score 6 0 10  10  Difficult doing work/chores Not difficult at all    Somewhat difficult   GAD-7 Scores:     08/26/2022   10:38 AM 08/12/2022    9:27 AM 03/15/2022   10:19 AM 03/15/2022    9:52 AM  GAD 7 : Generalized Anxiety Score  Nervous, Anxious, on Edge 0 0 0 0  Control/stop worrying 1 1 0 0  Worry too much - different things 1 1 0 0  Trouble relaxing 1 1 1 1   Restless 0 0 0 0  Easily annoyed or irritable 1 2 0 0  Afraid - awful might happen 0 0 0 0  Total GAD 7 Score 4 5 1 1   Anxiety Difficulty Not difficult at all Somewhat difficult  Not difficult at all    Stress Current stressors:  Ongoing grief Sleep:  Difficulty falling asleep Appetite:  Good Coping ability:  Resilient  Patient taking medications as prescribed:  Yes  Current medications:  Outpatient Encounter Medications as of 08/26/2022  Medication Sig   acetaminophen (TYLENOL) 500 MG tablet Take 1,000 mg by mouth every 6 (six) hours as needed for mild pain.   calcium-vitamin D (OSCAL  WITH D) 500-200 MG-UNIT tablet Take 1 tablet by mouth daily.   Cyanocobalamin (B-12) 1000 MCG CAPS Take 100 mcg by mouth daily.   cyclobenzaprine (FLEXERIL) 10 MG tablet TAKE 1 TABLET(10 MG) BY MOUTH AT BEDTIME   fluticasone (FLONASE) 50 MCG/ACT nasal spray SHAKE LIQUID AND USE 2 SPRAYS IN EACH NOSTRIL EVERY DAY (Patient taking differently: Place 1 spray into both nostrils daily as needed for allergies.)   HUMIRA PEN 40 MG/0.8ML PNKT Inject 40 mg into the skin every Wednesday. Once weekly   hydrOXYzine (VISTARIL) 25 MG capsule TAKE 1 TO 2 CAPSULES BY MOUTH AT BEDTIME FOR SLEEP   iron polysaccharides (NIFEREX) 150 MG capsule TAKE 1 CAPSULE(150 MG) BY MOUTH DAILY   Multiple Vitamin (MULTIVITAMIN WITH MINERALS) TABS tablet Take 1 tablet by mouth daily.   Omega-3 Fatty Acids (FISH OIL)  1200 MG CAPS Take 1,200 mg by mouth daily.    omeprazole (PRILOSEC) 20 MG capsule TAKE ONE (1) CAPSULE BY MOUTH TWICE A DAY BEFORE A MEAL (Patient taking differently: Take 20 mg by mouth daily.)   potassium chloride SA (K-DUR,KLOR-CON) 20 MEQ tablet Take 1 tablet (20 mEq total) by mouth 2 (two) times daily. (Patient taking differently: Take 40 mEq by mouth daily.)   Probiotic Product (PROBIOTIC PO) Take 1 tablet by mouth daily.   temazepam (RESTORIL) 7.5 MG capsule Take 1 capsule (7.5 mg total) by mouth at bedtime.   venlafaxine XR (EFFEXOR-XR) 37.5 MG 24 hr capsule TAKE 1 CAPSULE(37.5 MG) BY MOUTH DAILY WITH BREAKFAST   vitamin C (ASCORBIC ACID) 500 MG tablet Take 500 mg by mouth daily.   Vitamin D, Ergocalciferol, (DRISDOL) 1.25 MG (50000 UNIT) CAPS capsule TAKE ONE CAPSULE BY MOUTH ONCE A WEEK   No facility-administered encounter medications on file as of 08/26/2022.     Self-harm Behaviors Risk Assessment Self-harm risk factors:  Previous thoughts Patient endorses recent thoughts of harming self:  No current thoughts   Danger to Others Risk Assessment Danger to others risk factors:  No risk Patient endorses recent thoughts of harming others:  Denies   Substance Use Assessment Patient recently consumed alcohol:  No  Alcohol Use Disorder Identification Test (AUDIT):     08/19/2020    3:57 PM 08/24/2021    9:53 AM 07/22/2022    5:52 AM  Alcohol Use Disorder Test (AUDIT)  1. How often do you have a drink containing alcohol? 0 0 0  2. How many drinks containing alcohol do you have on a typical day when you are drinking? 0 0   3. How often do you have six or more drinks on one occasion? 0 0 0  AUDIT-C Score 0 0     Goals, Interventions and Follow-up Plan Goals:  Remain stable while processing loss of her son Interventions: Solution-Focused Strategies and CBT Cognitive Behavioral Therapy Follow-up Plan:  Bi-weekly counseling per request from patient   Reuel Boom

## 2022-08-26 NOTE — Patient Instructions (Addendum)
Continue education of the grief process.  Journal thoughts feelings to process in next visit.  Explore grief groups ie parents who lost children

## 2022-08-29 ENCOUNTER — Ambulatory Visit (INDEPENDENT_AMBULATORY_CARE_PROVIDER_SITE_OTHER): Payer: Medicare HMO | Admitting: Family Medicine

## 2022-08-29 ENCOUNTER — Encounter: Payer: Self-pay | Admitting: Family Medicine

## 2022-08-29 DIAGNOSIS — Z0000 Encounter for general adult medical examination without abnormal findings: Secondary | ICD-10-CM | POA: Diagnosis not present
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
## 2022-09-08 ENCOUNTER — Other Ambulatory Visit: Payer: Self-pay | Admitting: Family Medicine

## 2022-09-08 DIAGNOSIS — D509 Iron deficiency anemia, unspecified: Secondary | ICD-10-CM

## 2022-09-15 ENCOUNTER — Ambulatory Visit (INDEPENDENT_AMBULATORY_CARE_PROVIDER_SITE_OTHER): Payer: Medicare HMO | Admitting: Professional Counselor

## 2022-09-15 DIAGNOSIS — F321 Major depressive disorder, single episode, moderate: Secondary | ICD-10-CM

## 2022-09-15 NOTE — BH Specialist Note (Signed)
[facility] Follow-up  MRN: [id number] NAME: [name] Date: 09/15/22  Start time: Start Time: 0230 End time: Stop Time: 0300 Total time: Total Time in Minutes (Visit): 30 Call number: Visit Number: 4- Fourth Visit  Reason for call today:  The patient is a 52 year old female presenting for a collaborative care follow-up. She reports fluctuating moods as she continues to grieve the loss of her son a few months ago. She experiences periods of feeling okay some days and some days has denial, sadness, and anger. Initially, she frequently visited [place] her [place] passed away to seek closure but has recently taken a break from this routine. Her sleep patterns are still disrupted, with occasional rough nights; however, she had a good night's sleep recently and has started using a new watch to track her sleep more mindfully. To aid in improving her sleep routine, the behavioral health counselor provided her with a sleep hygiene worksheet.  The patient is still searching for closure, expressing a desire to see the vehicle in which her son died as she was unable to have a funeral or see his body. She is planning a Equities trader, hoping it will help bring the family together and provide some solace. Additionally, she has joined grieving parents' Facebook groups to seek mutual support from others who have experienced similar losses.  Overall, the patient remains stable with no suicidal thoughts or thoughts of self-harm. She continues to work and function well, although she acknowledges that she is still navigating the grieving process. Her PHQ-9 scores indicate that her level of depression has remained relatively unchanged, while her anxiety scores have decreased. She reports a good appetite and is taking her medication as prescribed.   PHQ-9 Scores:     09/15/2022    2:49 PM 09/15/2022    2:35 PM 08/26/2022   10:36 AM 08/16/2022    9:45 AM 08/12/2022    9:27 AM  Depression screen PHQ 2/9   Decreased Interest 1 1 1  0 1  Down, Depressed, Hopeless 1 1 1  0 2  PHQ - 2 Score 2 2 2  0 3  Altered sleeping 2 2 2  0 3  Tired, decreased energy 2 2 2  0 2  Change in appetite 1 1 0 0 1  Feeling bad or failure about yourself  0 0 0 0 0  Trouble concentrating 0 0 0 0 1  Moving slowly or fidgety/restless 0 0 0 0 0  Suicidal thoughts 0 0 0 0 0  PHQ-9 Score 7 7 6  0 10  Difficult doing work/chores Somewhat difficult Somewhat difficult Not difficult at all     GAD-7 Scores:     09/15/2022    2:50 PM 08/26/2022   10:38 AM 08/12/2022    9:27 AM 03/15/2022   10:19 AM  GAD 7 : Generalized Anxiety Score  Nervous, Anxious, on Edge 1 0 0 0  Control/stop worrying 0 1 1 0  Worry too much - different things 0 1 1 0  Trouble relaxing 1 1 1 1   Restless 0 0 0 0  Easily annoyed or irritable 0 1 2 0  Afraid - awful might happen 0 0 0 0  Total GAD 7 Score 2 4 5 1   Anxiety Difficulty Somewhat difficult Not difficult at all Somewhat difficult     Stress Current stressors:  Ongoing grief Sleep:  Sporadic Appetite:  Up and Down Coping ability:  Good Patient taking medications as prescribed:  Yes  Current medications:  Outpatient Encounter Medications as of 09/15/2022  Medication Sig   acetaminophen (TYLENOL) 500 MG tablet Take 1,000 mg by mouth every 6 (six) hours as needed for mild pain.   calcium-vitamin D (OSCAL WITH D) 500-200 MG-UNIT tablet Take 1 tablet by mouth daily.   Cyanocobalamin (B-12) 1000 MCG CAPS Take 100 mcg by mouth daily.   cyclobenzaprine (FLEXERIL) 10 MG tablet TAKE 1 TABLET(10 MG) BY MOUTH AT BEDTIME   fluticasone (FLONASE) 50 MCG/ACT nasal spray SHAKE LIQUID AND USE 2 SPRAYS IN EACH NOSTRIL EVERY DAY (Patient taking differently: Place 1 spray into both nostrils daily as needed for allergies.)   HUMIRA PEN 40 MG/0.8ML PNKT Inject 40 mg into the skin every Wednesday. Once weekly   hydrOXYzine (VISTARIL) 25 MG capsule TAKE 1 TO 2 CAPSULES BY MOUTH AT BEDTIME FOR SLEEP   iron  polysaccharides (NIFEREX) 150 MG capsule TAKE 1 CAPSULE(150 MG) BY MOUTH DAILY   Multiple Vitamin (MULTIVITAMIN WITH MINERALS) TABS tablet Take 1 tablet by mouth daily.   Omega-3 Fatty Acids (FISH OIL) 1200 MG CAPS Take 1,200 mg by mouth daily.    omeprazole (PRILOSEC) 20 MG capsule TAKE ONE (1) CAPSULE BY MOUTH TWICE A DAY BEFORE A MEAL (Patient taking differently: Take 20 mg by mouth daily.)   potassium chloride SA (K-DUR,KLOR-CON) 20 MEQ tablet Take 1 tablet (20 mEq total) by mouth 2 (two) times daily. (Patient taking differently: Take 40 mEq by mouth daily.)   Probiotic Product (PROBIOTIC PO) Take 1 tablet by mouth daily.   temazepam (RESTORIL) 7.5 MG capsule Take 1 capsule (7.5 mg total) by mouth at bedtime.   venlafaxine XR (EFFEXOR-XR) 37.5 MG 24 hr capsule TAKE 1 CAPSULE(37.5 MG) BY MOUTH DAILY WITH BREAKFAST   vitamin C (ASCORBIC ACID) 500 MG tablet Take 500 mg by mouth daily.   Vitamin D, Ergocalciferol, (DRISDOL) 1.25 MG (50000 UNIT) CAPS capsule TAKE ONE CAPSULE BY MOUTH ONCE A WEEK   No facility-administered encounter medications on file as of 09/15/2022.    Goals, Interventions and Follow-up Plan Goals: Increase healthy adjustment to current life circumstances Interventions: CBT Cognitive Behavioral Therapy Follow-up Plan:  Bi-weekly follow ups   Reuel Boom

## 2022-09-17 ENCOUNTER — Inpatient Hospital Stay (HOSPITAL_COMMUNITY)
Admission: EM | Admit: 2022-09-17 | Discharge: 2022-09-28 | DRG: 387 | Disposition: A | Discharge status: Home / Self Care | Payer: Medicare HMO | Attending: Family Medicine | Admitting: Family Medicine

## 2022-09-17 ENCOUNTER — Inpatient Hospital Stay (HOSPITAL_COMMUNITY): Payer: Medicare HMO

## 2022-09-17 ENCOUNTER — Other Ambulatory Visit: Payer: Self-pay

## 2022-09-17 ENCOUNTER — Ambulatory Visit (HOSPITAL_COMMUNITY): Payer: Medicare HMO | Admitting: Psychiatry

## 2022-09-17 ENCOUNTER — Encounter (HOSPITAL_COMMUNITY): Payer: Self-pay | Admitting: Family Medicine

## 2022-09-17 ENCOUNTER — Emergency Department (HOSPITAL_COMMUNITY): Payer: Medicare HMO

## 2022-09-17 DIAGNOSIS — Z825 Family history of asthma and other chronic lower respiratory diseases: Secondary | ICD-10-CM | POA: Diagnosis not present

## 2022-09-17 DIAGNOSIS — M5441 Lumbago with sciatica, right side: Secondary | ICD-10-CM | POA: Diagnosis not present

## 2022-09-17 DIAGNOSIS — Z560 Unemployment, unspecified: Secondary | ICD-10-CM

## 2022-09-17 DIAGNOSIS — D509 Iron deficiency anemia, unspecified: Secondary | ICD-10-CM | POA: Diagnosis not present

## 2022-09-17 DIAGNOSIS — K50018 Crohn's disease of small intestine with other complication: Secondary | ICD-10-CM | POA: Diagnosis not present

## 2022-09-17 DIAGNOSIS — K219 Gastro-esophageal reflux disease without esophagitis: Secondary | ICD-10-CM | POA: Diagnosis present

## 2022-09-17 DIAGNOSIS — D72829 Elevated white blood cell count, unspecified: Secondary | ICD-10-CM | POA: Diagnosis not present

## 2022-09-17 DIAGNOSIS — Z885 Allergy status to narcotic agent status: Secondary | ICD-10-CM

## 2022-09-17 DIAGNOSIS — K50812 Crohn's disease of both small and large intestine with intestinal obstruction: Principal | ICD-10-CM | POA: Diagnosis present

## 2022-09-17 DIAGNOSIS — K5080 Crohn's disease of both small and large intestine without complications: Secondary | ICD-10-CM | POA: Diagnosis present

## 2022-09-17 DIAGNOSIS — R9389 Abnormal findings on diagnostic imaging of other specified body structures: Secondary | ICD-10-CM | POA: Diagnosis not present

## 2022-09-17 DIAGNOSIS — R112 Nausea with vomiting, unspecified: Secondary | ICD-10-CM | POA: Diagnosis not present

## 2022-09-17 DIAGNOSIS — G8929 Other chronic pain: Secondary | ICD-10-CM | POA: Diagnosis not present

## 2022-09-17 DIAGNOSIS — Z634 Disappearance and death of family member: Secondary | ICD-10-CM | POA: Diagnosis not present

## 2022-09-17 DIAGNOSIS — Z813 Family history of other psychoactive substance abuse and dependence: Secondary | ICD-10-CM

## 2022-09-17 DIAGNOSIS — Y92239 Unspecified place in hospital as the place of occurrence of the external cause: Secondary | ICD-10-CM | POA: Diagnosis not present

## 2022-09-17 DIAGNOSIS — R935 Abnormal findings on diagnostic imaging of other abdominal regions, including retroperitoneum: Secondary | ICD-10-CM | POA: Diagnosis not present

## 2022-09-17 DIAGNOSIS — T394X6A Underdosing of antirheumatics, not elsewhere classified, initial encounter: Secondary | ICD-10-CM | POA: Diagnosis present

## 2022-09-17 DIAGNOSIS — N281 Cyst of kidney, acquired: Secondary | ICD-10-CM | POA: Diagnosis not present

## 2022-09-17 DIAGNOSIS — Z931 Gastrostomy status: Secondary | ICD-10-CM | POA: Diagnosis not present

## 2022-09-17 DIAGNOSIS — G4733 Obstructive sleep apnea (adult) (pediatric): Secondary | ICD-10-CM | POA: Diagnosis present

## 2022-09-17 DIAGNOSIS — R1033 Periumbilical pain: Secondary | ICD-10-CM | POA: Diagnosis not present

## 2022-09-17 DIAGNOSIS — Z818 Family history of other mental and behavioral disorders: Secondary | ICD-10-CM

## 2022-09-17 DIAGNOSIS — R109 Unspecified abdominal pain: Secondary | ICD-10-CM | POA: Diagnosis not present

## 2022-09-17 DIAGNOSIS — Z91199 Patient's noncompliance with other medical treatment and regimen due to unspecified reason: Secondary | ICD-10-CM

## 2022-09-17 DIAGNOSIS — Z9049 Acquired absence of other specified parts of digestive tract: Secondary | ICD-10-CM

## 2022-09-17 DIAGNOSIS — Z87891 Personal history of nicotine dependence: Secondary | ICD-10-CM

## 2022-09-17 DIAGNOSIS — Z79899 Other long term (current) drug therapy: Secondary | ICD-10-CM

## 2022-09-17 DIAGNOSIS — R110 Nausea: Secondary | ICD-10-CM | POA: Diagnosis not present

## 2022-09-17 DIAGNOSIS — F319 Bipolar disorder, unspecified: Secondary | ICD-10-CM | POA: Diagnosis present

## 2022-09-17 DIAGNOSIS — M5442 Lumbago with sciatica, left side: Secondary | ICD-10-CM | POA: Diagnosis present

## 2022-09-17 DIAGNOSIS — K56609 Unspecified intestinal obstruction, unspecified as to partial versus complete obstruction: Principal | ICD-10-CM | POA: Diagnosis present

## 2022-09-17 DIAGNOSIS — Z4682 Encounter for fitting and adjustment of non-vascular catheter: Secondary | ICD-10-CM | POA: Diagnosis not present

## 2022-09-17 DIAGNOSIS — Z8711 Personal history of peptic ulcer disease: Secondary | ICD-10-CM | POA: Diagnosis not present

## 2022-09-17 DIAGNOSIS — Z79620 Long term (current) use of immunosuppressive biologic: Secondary | ICD-10-CM | POA: Diagnosis not present

## 2022-09-17 DIAGNOSIS — F4321 Adjustment disorder with depressed mood: Secondary | ICD-10-CM | POA: Diagnosis present

## 2022-09-17 DIAGNOSIS — T380X5A Adverse effect of glucocorticoids and synthetic analogues, initial encounter: Secondary | ICD-10-CM | POA: Diagnosis not present

## 2022-09-17 DIAGNOSIS — Z91013 Allergy to seafood: Secondary | ICD-10-CM | POA: Diagnosis not present

## 2022-09-17 DIAGNOSIS — E876 Hypokalemia: Secondary | ICD-10-CM | POA: Diagnosis not present

## 2022-09-17 DIAGNOSIS — Z888 Allergy status to other drugs, medicaments and biological substances status: Secondary | ICD-10-CM | POA: Diagnosis not present

## 2022-09-17 DIAGNOSIS — K6389 Other specified diseases of intestine: Secondary | ICD-10-CM | POA: Diagnosis not present

## 2022-09-17 LAB — COMPREHENSIVE METABOLIC PANEL
ALT: 20 U/L (ref 0–44)
AST: 19 U/L (ref 15–41)
Albumin: 2.8 g/dL — ABNORMAL LOW (ref 3.5–5.0)
Alkaline Phosphatase: 206 U/L — ABNORMAL HIGH (ref 38–126)
Anion gap: 13 (ref 5–15)
BUN: 8 mg/dL (ref 6–20)
CO2: 29 mmol/L (ref 22–32)
Calcium: 8.8 mg/dL — ABNORMAL LOW (ref 8.9–10.3)
Chloride: 97 mmol/L — ABNORMAL LOW (ref 98–111)
Creatinine, Ser: 0.63 mg/dL (ref 0.44–1.00)
GFR, Estimated: >60 mL/min (ref >60)
Glucose, Bld: 101 mg/dL — ABNORMAL HIGH (ref 70–99)
Potassium: 3 mmol/L — ABNORMAL LOW (ref 3.5–5.1)
Sodium: 139 mmol/L (ref 135–145)
Total Bilirubin: 0.4 mg/dL (ref 0.3–1.2)
Total Protein: 7.8 g/dL (ref 6.5–8.1)

## 2022-09-17 LAB — URINALYSIS, ROUTINE W REFLEX MICROSCOPIC
Bilirubin Urine: NEGATIVE
Glucose, UA: NEGATIVE mg/dL
Hgb urine dipstick: NEGATIVE
Ketones, ur: NEGATIVE mg/dL
Leukocytes,Ua: NEGATIVE
Nitrite: NEGATIVE
Protein, ur: 30 mg/dL — AB
Specific Gravity, Urine: 1.023 (ref 1.005–1.030)
pH: 5 (ref 5.0–8.0)

## 2022-09-17 LAB — CBC
HCT: 37.4 % (ref 36.0–46.0)
Hemoglobin: 12.1 g/dL (ref 12.0–15.0)
MCH: 26.7 pg (ref 26.0–34.0)
MCHC: 32.4 g/dL (ref 30.0–36.0)
MCV: 82.6 fL (ref 80.0–100.0)
Platelets: 520 10*3/uL — ABNORMAL HIGH (ref 150–400)
RBC: 4.53 MIL/uL (ref 3.87–5.11)
RDW: 15.4 % (ref 11.5–15.5)
WBC: 14.1 10*3/uL — ABNORMAL HIGH (ref 4.0–10.5)
nRBC: 0 % (ref 0.0–0.2)

## 2022-09-17 LAB — LIPASE, BLOOD: Lipase: 25 U/L (ref 11–51)

## 2022-09-17 LAB — MAGNESIUM: Magnesium: 1.9 mg/dL (ref 1.7–2.4)

## 2022-09-17 MED ORDER — POTASSIUM CHLORIDE CRYS ER 20 MEQ PO TBCR: 40.0000 meq | EXTENDED_RELEASE_TABLET | Freq: Once | ORAL | Status: DC

## 2022-09-17 MED ORDER — METHYLPREDNISOLONE SODIUM SUCC 40 MG IJ SOLR
60.0000 mg | Freq: Once | INTRAMUSCULAR | Status: AC
  Administered 2022-09-17: 60 mg via INTRAVENOUS
  Filled 2022-09-17: qty 2

## 2022-09-17 MED ORDER — ONDANSETRON HCL 4 MG/2ML IJ SOLN
4.0000 mg | Freq: Once | INTRAMUSCULAR | Status: AC
  Administered 2022-09-17: 4 mg via INTRAVENOUS
  Filled 2022-09-17: qty 2

## 2022-09-17 MED ORDER — IOHEXOL 300 MG/ML  SOLN
100.0000 mL | Freq: Once | INTRAMUSCULAR | Status: AC | PRN
  Administered 2022-09-17: 100 mL via INTRAVENOUS

## 2022-09-17 MED ORDER — POTASSIUM CHLORIDE 10 MEQ/100ML IV SOLN
10.0000 meq | INTRAVENOUS | Status: AC
  Administered 2022-09-17 (×4): 10 meq via INTRAVENOUS
  Filled 2022-09-17 (×4): qty 100

## 2022-09-17 MED ORDER — HYDROMORPHONE HCL 1 MG/ML IJ SOLN
1.0000 mg | INTRAMUSCULAR | Status: DC | PRN
  Administered 2022-09-17 – 2022-09-28 (×45): 1 mg via INTRAVENOUS
  Filled 2022-09-17 (×45): qty 1

## 2022-09-17 MED ORDER — ONDANSETRON HCL 4 MG/2ML IJ SOLN
4.0000 mg | Freq: Four times a day (QID) | INTRAMUSCULAR | Status: DC | PRN
  Administered 2022-09-17 – 2022-09-28 (×11): 4 mg via INTRAVENOUS
  Filled 2022-09-17 (×13): qty 2

## 2022-09-17 MED ORDER — ENOXAPARIN SODIUM 40 MG/0.4ML IJ SOSY
40.0000 mg | PREFILLED_SYRINGE | INTRAMUSCULAR | Status: DC
  Administered 2022-09-17 – 2022-09-27 (×11): 40 mg via SUBCUTANEOUS
  Filled 2022-09-17 (×11): qty 0.4

## 2022-09-17 MED ORDER — HYDROMORPHONE HCL 1 MG/ML IJ SOLN
1.0000 mg | Freq: Once | INTRAMUSCULAR | Status: AC
  Administered 2022-09-17: 1 mg via INTRAVENOUS
  Filled 2022-09-17: qty 1

## 2022-09-17 MED ORDER — LACTATED RINGERS IV SOLN: INTRAVENOUS | Status: DC

## 2022-09-17 MED ORDER — SODIUM CHLORIDE 0.9 % IV BOLUS
1000.0000 mL | Freq: Once | INTRAVENOUS | Status: AC
  Administered 2022-09-17: 1000 mL via INTRAVENOUS

## 2022-09-17 MED ORDER — ONDANSETRON HCL 4 MG PO TABS
4.0000 mg | ORAL_TABLET | Freq: Four times a day (QID) | ORAL | Status: DC | PRN
  Administered 2022-09-24 – 2022-09-28 (×6): 4 mg via ORAL
  Filled 2022-09-17 (×7): qty 1

## 2022-09-17 NOTE — ED Provider Notes (Signed)
EMERGENCY DEPARTMENT AT [facility] Provider Note   CSN: [id number] Arrival date & time: 09/17/22  1132     History  Chief Complaint  Patient presents with   Abdominal Pain   HPI [name] is a 52 y.o. female with  Crohn's diesease, GERD, iron deficient anemia and h/o PUD presenting for abdominal pain.  Started yesterday afternoon.  Located about the umbilicus and radiates generally about the abdomen.  There is a sharp pain.  Endorses nausea but no vomiting or diarrhea.  States it feels similar to Crohn's flare she has had in the past.  Denies urinary changes.  Denies fever.   Abdominal Pain      Home Medications Prior to Admission medications   Medication Sig Start Date End Date Taking? Authorizing Provider  acetaminophen (TYLENOL) 500 MG tablet Take 1,000 mg by mouth every 6 (six) hours as needed for mild pain.   Yes [provider]  calcium-vitamin D (OSCAL WITH D) 500-200 MG-UNIT tablet Take 1 tablet by mouth daily.   Yes [provider]  cyclobenzaprine (FLEXERIL) 10 MG tablet TAKE 1 TABLET(10 MG) BY MOUTH AT BEDTIME 07/11/22  Yes Kerri Perches, MD  hydrOXYzine (VISTARIL) 25 MG capsule TAKE 1 TO 2 CAPSULES BY MOUTH AT BEDTIME FOR SLEEP 07/11/22  Yes Kerri Perches, MD  iron polysaccharides (NIFEREX) 150 MG capsule TAKE 1 CAPSULE(150 MG) BY MOUTH DAILY Patient taking differently: Take 150 mg by mouth daily. 09/09/22  Yes Kerri Perches, MD  Multiple Vitamin (MULTIVITAMIN WITH MINERALS) TABS tablet Take 1 tablet by mouth daily.   Yes [provider]  Omega-3 Fatty Acids (FISH OIL) 1200 MG CAPS Take 1,200 mg by mouth daily.    Yes [provider]  Probiotic Product (PROBIOTIC PO) Take 1 tablet by mouth daily.   Yes [provider]  Pyridoxine HCl (VITAMIN B6 PO) Take 1 tablet by mouth daily.   Yes [provider]  temazepam (RESTORIL) 7.5 MG capsule Take 1 capsule (7.5 mg total) by mouth at  bedtime. 07/25/22  Yes Kerri Perches, MD  venlafaxine XR (EFFEXOR-XR) 37.5 MG 24 hr capsule TAKE 1 CAPSULE(37.5 MG) BY MOUTH DAILY WITH BREAKFAST Patient taking differently: Take 37.5 mg by mouth daily with breakfast. 09/09/22  Yes Kerri Perches, MD  vitamin C (ASCORBIC ACID) 500 MG tablet Take 500 mg by mouth daily.   Yes [provider]      Allergies    Feraheme [ferumoxytol], Peanut-containing drug products, Shellfish allergy, Oxycodone-acetaminophen, and Tramadol hcl    Review of Systems   Review of Systems  Gastrointestinal:  Positive for abdominal pain.    Physical Exam Updated Vital Signs BP (!) 151/77 (BP Location: Left Arm)   Pulse 100   Temp 98.7 F (37.1 C) (Oral)   Resp 16   Ht 5\' 7"  (1.702 m)   Wt 78 kg   SpO2 98%   BMI 26.94 kg/m  Physical Exam Vitals and nursing note reviewed.  HENT:     Head: Normocephalic and atraumatic.     Mouth/Throat:     Mouth: Mucous membranes are moist.  Eyes:     General:        Right eye: No discharge.        Left eye: No discharge.     Conjunctiva/sclera: Conjunctivae normal.  Cardiovascular:     Rate and Rhythm: Normal rate and regular rhythm.     Pulses: Normal pulses.  Heart sounds: Normal heart sounds.  Pulmonary:     Effort: Pulmonary effort is normal.     Breath sounds: Normal breath sounds.  Abdominal:     General: Abdomen is flat. There is no distension.     Palpations: Abdomen is soft.     Tenderness: There is generalized abdominal tenderness.  Skin:    General: Skin is warm and dry.  Neurological:     General: No focal deficit present.  Psychiatric:        Mood and Affect: Mood normal.     ED Results / Procedures / Treatments   Labs (all labs ordered are listed, but only abnormal results are displayed) Labs Reviewed  COMPREHENSIVE METABOLIC PANEL - Abnormal; Notable for the following components:      Result Value   Potassium 3.0 (*)    Chloride 97 (*)    Glucose, Bld 101 (*)     Calcium 8.8 (*)    Albumin 2.8 (*)    Alkaline Phosphatase 206 (*)    All other components within normal limits  CBC - Abnormal; Notable for the following components:   WBC 14.1 (*)    Platelets 520 (*)    All other components within normal limits  URINALYSIS, ROUTINE W REFLEX MICROSCOPIC - Abnormal; Notable for the following components:   Protein, ur 30 (*)    Bacteria, UA RARE (*)    All other components within normal limits  CBC - Abnormal; Notable for the following components:   Hemoglobin 10.8 (*)    HCT 33.8 (*)    RDW 16.0 (*)    Platelets 493 (*)    All other components within normal limits  BASIC METABOLIC PANEL - Abnormal; Notable for the following components:   Potassium 3.2 (*)    Glucose, Bld 107 (*)    Calcium 8.1 (*)    All other components within normal limits  BASIC METABOLIC PANEL - Abnormal; Notable for the following components:   Potassium 3.2 (*)    Calcium 8.1 (*)    All other components within normal limits  CBC - Abnormal; Notable for the following components:   RBC 3.66 (*)    Hemoglobin 10.1 (*)    HCT 31.1 (*)    RDW 15.9 (*)    Platelets 443 (*)    nRBC 0.4 (*)    All other components within normal limits  MAGNESIUM - Abnormal; Notable for the following components:   Magnesium 1.6 (*)    All other components within normal limits  BASIC METABOLIC PANEL - Abnormal; Notable for the following components:   Calcium 8.1 (*)    All other components within normal limits  LIPASE, BLOOD  MAGNESIUM  HIV ANTIBODY (ROUTINE TESTING W REFLEX)  MAGNESIUM    EKG None  Radiology No results found.  Procedures Procedures    Medications Ordered in ED Medications  HYDROmorphone (DILAUDID) injection 1 mg (1 mg Intravenous Given 09/20/22 0727)  enoxaparin (LOVENOX) injection 40 mg (40 mg Subcutaneous Given 09/19/22 2132)  ondansetron (ZOFRAN) tablet 4 mg ( Oral See Alternative 09/20/22 1306)    Or  ondansetron (ZOFRAN) injection 4 mg (4 mg  Intravenous Given 09/20/22 1306)  phenol (CHLORASEPTIC) mouth spray 1 spray (1 spray Mouth/Throat Given 09/18/22 0709)  methylPREDNISolone sodium succinate (SOLU-MEDROL) 125 mg/2 mL injection 60 mg (60 mg Intravenous Given 09/20/22 0909)  sodium chloride (OCEAN) 0.65 % nasal spray 1 spray (1 spray Each Nare Given 09/18/22 2018)  0.9 % NaCl with KCl  40 mEq / L  infusion ( Intravenous New Bag/Given 09/20/22 1114)  HYDROmorphone (DILAUDID) injection 1 mg (1 mg Intravenous Given 09/17/22 1331)  sodium chloride 0.9 % bolus 1,000 mL (0 mLs Intravenous Stopped 09/17/22 1453)  ondansetron (ZOFRAN) injection 4 mg (4 mg Intravenous Given 09/17/22 1331)  iohexol (OMNIPAQUE) 300 MG/ML solution 100 mL (100 mLs Intravenous Contrast Given 09/17/22 1415)  methylPREDNISolone sodium succinate (SOLU-MEDROL) 40 mg/mL injection 60 mg (60 mg Intravenous Given 09/17/22 2213)  potassium chloride 10 mEq in 100 mL IVPB (0 mEq Intravenous Stopped 09/17/22 2243)  diphenhydrAMINE (BENADRYL) injection 12.5 mg (12.5 mg Intravenous Given 09/18/22 0924)  diphenhydrAMINE (BENADRYL) injection 12.5 mg (12.5 mg Intravenous Given 09/18/22 1628)  magnesium sulfate IVPB 2 g 50 mL (2 g Intravenous New Bag/Given 09/19/22 1708)    ED Course/ Medical Decision Making/ A&P Clinical Course as of 09/20/22 1409  Sat Sep 17, 2022  1520 [phone] [JR]  1537 Discussed patient with Dr. Henreitta Leber of general surgery who advised n.p.o., NG placement and admission to the hospital for small bowel obstruction.  Also advised to reach out to GI per their recommendations. [JR]  1545 Also discussed patient with Dr. Marletta Lor of gastroenterology who recommended 60 mg of IV Solu-Medrol and admission to the hospital.  Advised he would see her in consult as well. [JR]    Clinical Course User Index [JR] Gareth Eagle, PA-C                             Medical Decision Making Amount and/or Complexity of Data Reviewed Labs: ordered. Radiology:  ordered.  Risk Prescription drug management. Decision regarding hospitalization.   52 year old well-appearing female presenting for abdominal pain.  Exam notable for generalized abdominal tenderness.  DDx includes appendicitis, cholecystitis, Crohn's flare, pancreatitis, diverticulitis or other.  CT revealed concern for small bowel obstruction.  Discussed case with GI and surgery.  See clinical course above for recommendations.  Admitted to hospital service with Dr. Candelaria Celeste.        Final Clinical Impression(s) / ED Diagnoses Final diagnoses:  Small bowel obstruction [facility])    Rx / DC Orders ED Discharge Orders     None         Gareth Eagle, PA-C 09/20/22 1409    Bethann Berkshire, MD 09/21/22 1729

## 2022-09-17 NOTE — Progress Notes (Signed)
Per xray impression, NG tube advanced 2cm. New order placed for repeat xray to verify placement.

## 2022-09-17 NOTE — Progress Notes (Signed)
[facility]  Reviewed imaging, obstruction related to bowel wall thickening Crohn's. I reviewed the CT scan and the colon is on the lateral aspect and the small bowel medial. I do not see any signs of volvulus. I see thickened bowel.  NG, GI for any Crohn's treatment. Would not pursue any surgical intervention unless absolutely necessary as patient has Crohn's and already has one resection.  Will see tomorrow.    Algis Greenhouse, MD [facility] [address] [place], [place] [address] [phone] (office)
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
## 2022-09-17 NOTE — ED Triage Notes (Signed)
Pt reports:  Abdomen pain Intermittent Hx of Chron's  Ongoing x2 days Worsening this morning

## 2022-09-17 NOTE — Progress Notes (Signed)
   09/17/22 1758  TOC Brief Assessment  Insurance and Status Reviewed  Patient has primary care physician Yes  Home environment has been reviewed Home  Prior level of function: Independent  Prior/Current Home Services No current home services  Social Determinants of Health Reivew SDOH reviewed no interventions necessary  Readmission risk has been reviewed Yes [place] Si)  Transition of care needs no transition of care needs at this time

## 2022-09-17 NOTE — Progress Notes (Signed)
   09/17/22 1756  Assess: MEWS Score  Temp 98.5 F (36.9 C)  BP 130/88  Pulse Rate (!) 114  Resp (!) 22  Level of Consciousness Alert  SpO2 100 %  O2 Device Room Air  Assess: MEWS Score  MEWS Temp 0  MEWS Systolic 0  MEWS Pulse 2  MEWS RR 1  MEWS LOC 0  MEWS Score 3  MEWS Score Color Yellow  Assess: if the MEWS score is Yellow or Red  Were vital signs taken at a resting state? Yes  Focused Assessment No change from prior assessment  Does the patient meet 2 or more of the SIRS criteria? No  MEWS guidelines implemented  Yes, yellow  Treat  MEWS Interventions Considered administering scheduled or prn medications/treatments as ordered  Take Vital Signs  Increase Vital Sign Frequency  Yellow: Q2hr x1, continue Q4hrs until patient remains green for 12hrs  Escalate  MEWS: Escalate Yellow: Discuss with charge nurse and consider notifying provider and/or RRT  Notify: Charge Nurse/RN  Name of Charge Nurse/RN Notified Jiles Crocker, RN  Provider Notification  Provider Name/Title Dr. Adrian Blackwater  Date Provider Notified 09/17/22  Time Provider Notified 1758  Method of Notification Page  Notification Reason Change in status  Assess: SIRS CRITERIA  SIRS Temperature  0  SIRS Pulse 1  SIRS Respirations  1  SIRS WBC 1  SIRS Score Sum  3

## 2022-09-18 ENCOUNTER — Inpatient Hospital Stay (HOSPITAL_COMMUNITY): Payer: Medicare HMO

## 2022-09-18 DIAGNOSIS — K50812 Crohn's disease of both small and large intestine with intestinal obstruction: Secondary | ICD-10-CM

## 2022-09-18 DIAGNOSIS — E876 Hypokalemia: Secondary | ICD-10-CM | POA: Diagnosis not present

## 2022-09-18 DIAGNOSIS — R109 Unspecified abdominal pain: Secondary | ICD-10-CM

## 2022-09-18 DIAGNOSIS — R112 Nausea with vomiting, unspecified: Secondary | ICD-10-CM | POA: Diagnosis not present

## 2022-09-18 DIAGNOSIS — K56609 Unspecified intestinal obstruction, unspecified as to partial versus complete obstruction: Secondary | ICD-10-CM

## 2022-09-18 LAB — BASIC METABOLIC PANEL
Anion gap: 10 (ref 5–15)
BUN: 9 mg/dL (ref 6–20)
CO2: 28 mmol/L (ref 22–32)
Calcium: 8.1 mg/dL — ABNORMAL LOW (ref 8.9–10.3)
Chloride: 99 mmol/L (ref 98–111)
Creatinine, Ser: 0.6 mg/dL (ref 0.44–1.00)
GFR, Estimated: >60 mL/min (ref >60)
Glucose, Bld: 107 mg/dL — ABNORMAL HIGH (ref 70–99)
Potassium: 3.2 mmol/L — ABNORMAL LOW (ref 3.5–5.1)
Sodium: 137 mmol/L (ref 135–145)

## 2022-09-18 LAB — CBC
HCT: 33.8 % — ABNORMAL LOW (ref 36.0–46.0)
Hemoglobin: 10.8 g/dL — ABNORMAL LOW (ref 12.0–15.0)
MCH: 27 pg (ref 26.0–34.0)
MCHC: 32 g/dL (ref 30.0–36.0)
MCV: 84.5 fL (ref 80.0–100.0)
Platelets: 493 10*3/uL — ABNORMAL HIGH (ref 150–400)
RBC: 4 MIL/uL (ref 3.87–5.11)
RDW: 16 % — ABNORMAL HIGH (ref 11.5–15.5)
WBC: 10.2 10*3/uL (ref 4.0–10.5)
nRBC: 0 % (ref 0.0–0.2)

## 2022-09-18 LAB — HIV ANTIBODY (ROUTINE TESTING W REFLEX): HIV Screen 4th Generation wRfx: NONREACTIVE

## 2022-09-18 MED ORDER — DIPHENHYDRAMINE HCL 50 MG/ML IJ SOLN
12.5000 mg | Freq: Once | INTRAMUSCULAR | Status: AC
  Administered 2022-09-18: 12.5 mg via INTRAVENOUS
  Filled 2022-09-18: qty 1

## 2022-09-18 MED ORDER — METHYLPREDNISOLONE SODIUM SUCC 125 MG IJ SOLR
60.0000 mg | Freq: Every day | INTRAMUSCULAR | Status: DC
  Administered 2022-09-18 – 2022-09-21 (×4): 60 mg via INTRAVENOUS
  Filled 2022-09-18 (×4): qty 2

## 2022-09-18 MED ORDER — SALINE SPRAY 0.65 % NA SOLN
1.0000 | NASAL | Status: DC | PRN
  Administered 2022-09-18: 1 via NASAL
  Filled 2022-09-18: qty 44

## 2022-09-18 MED ORDER — PHENOL 1.4 % MT LIQD
1.0000 | OROMUCOSAL | Status: DC | PRN
  Administered 2022-09-18 (×2): 1 via OROMUCOSAL
  Filled 2022-09-18: qty 177

## 2022-09-18 NOTE — Progress Notes (Signed)
PROGRESS NOTE  [name] YKD:[id number] DOB: [birth year] DOA: 09/17/2022 PCP: Kerri Perches, MD  Brief History:  52 year old female with a history of Crohn's disease on chronic Humira status post ileocolonic resection, GERD, bipolar disorder, OSA presenting with abdominal pain that began on 09/16/2022.  She had associated nausea and vomiting.  She denied fevers, chills, chest pain, shortness of breath.  She states that her abdominal pain was similar to her previous Crohn's exacerbations.  Because of worsening pain and vomiting, she presented for further evaluation and treatment. In the ED, the patient was afebrile and hemodynamically stable with oxygen saturation 90% room air.  WBC 14.1, hemoglobin 12.1, platelets 520,000.  Sodium 137, potassium 3.2, bicarbonate 28, serum creatinine 0.60.  LFTs were unremarkable.  CT of the abdomen and pelvis showed high-grade SBO secondary to marked inflammatory changes with bowel wall thickening at the level of the terminal ileum and right colon anastomosis.  There was diffuse mucosal thickening of the distal ileum. The patient was given a dose of IV Solu-Medrol.  GI and general surgery consulted to assist with management.  Upon further questioning, it was noted the patient had not received the Humira dose since April 2024.   Assessment/Plan: Crohn's disease exacerbation -Appreciate GI consultation -Appreciate general surgery consult -Case discussed with Drs. Marletta Lor and Henreitta Leber -The patient's current obstruction secondary to Crohn's exacerbation with inflammatory changes worsening her established stricture at her anastomosis -Continue IV Solu-Medrol -Continue IV fluids -judious opioids -Continue NG decompression  Hypokalemia -Replete -check mag  Bipolar disorder -Restart Effexor and vistaril once able to tolerate p.o.          Family Communication: no  Family at bedside  Consultants:  GI, general surgery  Code Status:   FULL   DVT Prophylaxis:   Sharp Lovenox   Procedures: As Listed in Progress Note Above  Antibiotics: None      Subjective: Patient denies fevers, chills, headache, chest pain, dyspnea, nausea, vomiting, diarrhea,dysuria, hematuria, hematochezia, and melena.  She complains of abdominal pain.  There is no flatus or bowel movement yet.   Objective: Vitals:   09/17/22 2009 09/17/22 2339 09/18/22 0401 09/18/22 0800  BP: (!) 162/98 (!) 149/67 123/81 (!) 119/55  Pulse: (!) 106 (!) 106 (!) 103 100  Resp: 20 19 19    Temp: 98.3 F (36.8 C) 98.3 F (36.8 C) 98.1 F (36.7 C) 98.5 F (36.9 C)  TempSrc: Oral Oral Oral   SpO2: 96% 94% 96% 92%  Weight:      Height:        Intake/Output Summary (Last 24 hours) at 09/18/2022 1203 Last data filed at 09/18/2022 0531 Gross per 24 hour  Intake 1936.55 ml  Output 200 ml  Net 1736.55 ml   Weight change:  Exam:  General:  Pt is alert, follows commands appropriately, not in acute distress HEENT: No icterus, No thrush, No neck mass, [place]/AT Cardiovascular: RRR, S1/S2, no rubs, no gallops Respiratory: Bibasilar rales.  No wheezing. Abdomen: Soft/+BS, diffusely tender, non distended, no guarding Extremities: No edema, No lymphangitis, No petechiae, No rashes, no synovitis   Data Reviewed: I have personally reviewed following labs and imaging studies Basic Metabolic Panel: Recent Labs  Lab 09/17/22 1145 09/17/22 1155 09/18/22 0415  NA  --  139 137  K  --  3.0* 3.2*  CL  --  97* 99  CO2  --  29 28  GLUCOSE  --  101* 107*  BUN  --  8 9  CREATININE  --  0.63 0.60  CALCIUM  --  8.8* 8.1*  MG 1.9  --   --    Liver Function Tests: Recent Labs  Lab 09/17/22 1155  AST 19  ALT 20  ALKPHOS 206*  BILITOT 0.4  PROT 7.8  ALBUMIN 2.8*   Recent Labs  Lab 09/17/22 1155  LIPASE 25   No results for input(s): "AMMONIA" in the last 168 hours. Coagulation Profile: No results for input(s): "INR", "PROTIME" in the last 168  hours. CBC: Recent Labs  Lab 09/17/22 1155 09/18/22 0415  WBC 14.1* 10.2  HGB 12.1 10.8*  HCT 37.4 33.8*  MCV 82.6 84.5  PLT 520* 493*   Cardiac Enzymes: No results for input(s): "CKTOTAL", "CKMB", "CKMBINDEX", "TROPONINI" in the last 168 hours. BNP: Invalid input(s): "POCBNP" CBG: No results for input(s): "GLUCAP" in the last 168 hours. HbA1C: No results for input(s): "HGBA1C" in the last 72 hours. Urine analysis:    Component Value Date/Time   COLORURINE YELLOW 09/17/2022 1200   APPEARANCEUR CLEAR 09/17/2022 1200   LABSPEC 1.023 09/17/2022 1200   PHURINE 5.0 09/17/2022 1200   GLUCOSEU NEGATIVE 09/17/2022 1200   HGBUR NEGATIVE 09/17/2022 1200   HGBUR negative 08/10/2009 1013   BILIRUBINUR NEGATIVE 09/17/2022 1200   KETONESUR NEGATIVE 09/17/2022 1200   PROTEINUR 30 (A) 09/17/2022 1200   UROBILINOGEN 0.2 10/17/2010 1249   NITRITE NEGATIVE 09/17/2022 1200   LEUKOCYTESUR NEGATIVE 09/17/2022 1200   Sepsis Labs: @LABRCNTIP (procalcitonin:4,lacticidven:4) )No results found for this or any previous visit (from the past 240 hour(s)).   Scheduled Meds:  enoxaparin (LOVENOX) injection  40 mg Subcutaneous Q24H   methylPREDNISolone (SOLU-MEDROL) injection  60 mg Intravenous Daily   Continuous Infusions:  lactated ringers 125 mL/hr at 09/18/22 0302    Procedures/Studies: DG Abd 1 View  Result Date: 09/18/2022 CLINICAL DATA:  Check gastric catheter placement EXAM: ABDOMEN - 1 VIEW COMPARISON:  None Available. FINDINGS: Gastric catheter extends into the stomach. The proximal side port now lies within the gastric lumen. Lungs are hypoinflated but clear. No bony abnormality is noted. IMPRESSION: Gastric catheter in satisfactory position. Electronically Signed   By: Alcide Clever M.D.   On: 09/18/2022 00:50   DG Abd 1 View  Result Date: 09/17/2022 CLINICAL DATA:  Check gastric catheter placement EXAM: ABDOMEN - 1 VIEW COMPARISON:  None Available. FINDINGS: Gastric catheter is  noted with the tip in the stomach. Proximal side port lies at the gastroesophageal junction. This should be advanced deeper into the stomach. Lungs are clear but hypoinflated. No free air is noted in the abdomen. IMPRESSION: Gastric catheter as described. This should be advanced deeper into the stomach. Electronically Signed   By: Alcide Clever M.D.   On: 09/17/2022 21:52   DG Abd Portable 1V  Result Date: 09/17/2022 CLINICAL DATA:  NG tube placement. EXAM: PORTABLE ABDOMEN - 1 VIEW COMPARISON:  October 28, 2017 FINDINGS: Enteric catheter terminates at the expected location of gastric body, side hole at the level of the GE junction. Partially visualized distension of small bowels. Elevation of the left hemidiaphragm. IMPRESSION: Enteric catheter terminates at the expected location of gastric body, side hole at the level of the GE junction. Advancement of the enteric catheter is recommended. Electronically Signed   By: Ted Mcalpine M.D.   On: 09/17/2022 19:06   CT ABDOMEN PELVIS W CONTRAST  Result Date: 09/17/2022 CLINICAL DATA:  Abdominal pain.  History of Crohn's disease. EXAM: CT ABDOMEN  AND PELVIS WITH CONTRAST TECHNIQUE: Multidetector CT imaging of the abdomen and pelvis was performed using the standard protocol following bolus administration of intravenous contrast. RADIATION DOSE REDUCTION: This exam was performed according to the departmental dose-optimization program which includes automated exposure control, adjustment of the mA and/or kV according to patient size and/or use of iterative reconstruction technique. CONTRAST:  OMNIPAQUE IOHEXOL 300 MG/ML  SOLN COMPARISON:  October 28, 2017 FINDINGS: Lower chest: No acute abnormality. Hepatobiliary: Diffuse intrahepatic biliary ductal dilation. Post cholecystectomy. Pancreas: Unremarkable. No pancreatic ductal dilatation or surrounding inflammatory changes. Spleen: Normal in size without focal abnormality. Adrenals/Urinary Tract: Adrenal  glands are unremarkable. Kidneys are normal, without renal calculi, focal lesion, or hydronephrosis. Bladder is unremarkable. 1.5 cm benign right renal cyst. Stomach/Bowel: The stomach is fluid-filled and moderately distended. Diffuse distension of small bowel loops which are also fluid-filled to the level of the distal ileum. Previous resection of the terminal ileum. Marked inflammatory changes and bowel wall thickening at the anastomosis causing a high-grade small bowel obstruction. The small bowel loops upstream of dilated to 5.3 cm. The more distal portion of the ileum demonstrates diffuse mucosal thickening. The colon is decompressed. Vascular/Lymphatic: No significant vascular findings are present. No enlarged abdominal or pelvic lymph nodes. Reproductive: Uterus and bilateral adnexa are unremarkable. Other: No abdominal wall hernia or abnormality. No abdominopelvic ascites. Musculoskeletal: Spondylosis at L5-S1. IMPRESSION: 1. High-grade small bowel obstruction secondary to marked inflammatory changes and bowel wall thickening versus volvulus at the anastomosis of the terminal ileum and right colon. 2. Diffuse mucosal thickening of the more distal portion of the ileum. 3. Diffuse intrahepatic biliary ductal dilation. 4. Post cholecystectomy. 5. Spondylosis at L5-S1. Electronically Signed   By: Ted Mcalpine M.D.   On: 09/17/2022 15:06    Catarina Hartshorn, DO  [facility]  If 7PM-7AM, please contact night-coverage [email or web address] Password [email or web address] 09/18/2022, 12:03 PM   LOS: 1 day
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
## 2022-09-18 NOTE — Hospital Course (Addendum)
52 year old female with a history of Crohn's disease on chronic Humira status post ileocolonic resection, GERD, bipolar disorder, OSA presenting with abdominal pain that began on 09/16/2022.  She had associated nausea and vomiting.  She denied fevers, chills, chest pain, shortness of breath.  She states that her abdominal pain was similar to her previous Crohn's exacerbations.  Because of worsening pain and vomiting, she presented for further evaluation and treatment. In the ED, the patient was afebrile and hemodynamically stable with oxygen saturation 90% room air.  WBC 14.1, hemoglobin 12.1, platelets 520,000.  Sodium 137, potassium 3.2, bicarbonate 28, serum creatinine 0.60.  LFTs were unremarkable.  CT of the abdomen and pelvis showed high-grade SBO secondary to marked inflammatory changes with bowel wall thickening at the level of the terminal ileum and right colon anastomosis.  There was diffuse mucosal thickening of the distal ileum. The patient was given a dose of IV Solu-Medrol.  GI and general surgery consulted to assist with management.  Upon further questioning, it was noted the patient had not received the Humira dose since April 2024.  05/10/2021 colonoscopy--noted the opening to the neoterminal ileum was very narrow; unable to pass/intubate with pediatric colonoscope
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
## 2022-09-18 NOTE — Progress Notes (Signed)
Pt requesting ice chips. [name] stated it was okay to give.

## 2022-09-19 ENCOUNTER — Other Ambulatory Visit: Payer: Self-pay

## 2022-09-19 DIAGNOSIS — K56609 Unspecified intestinal obstruction, unspecified as to partial versus complete obstruction: Secondary | ICD-10-CM | POA: Diagnosis not present

## 2022-09-19 DIAGNOSIS — R109 Unspecified abdominal pain: Secondary | ICD-10-CM | POA: Diagnosis not present

## 2022-09-19 DIAGNOSIS — E876 Hypokalemia: Secondary | ICD-10-CM | POA: Diagnosis not present

## 2022-09-19 DIAGNOSIS — K50812 Crohn's disease of both small and large intestine with intestinal obstruction: Secondary | ICD-10-CM | POA: Diagnosis not present

## 2022-09-19 DIAGNOSIS — R112 Nausea with vomiting, unspecified: Secondary | ICD-10-CM | POA: Diagnosis not present

## 2022-09-19 LAB — BASIC METABOLIC PANEL
Anion gap: 9 (ref 5–15)
BUN: 12 mg/dL (ref 6–20)
CO2: 29 mmol/L (ref 22–32)
Calcium: 8.1 mg/dL — ABNORMAL LOW (ref 8.9–10.3)
Chloride: 100 mmol/L (ref 98–111)
Creatinine, Ser: 0.59 mg/dL (ref 0.44–1.00)
GFR, Estimated: >60 mL/min (ref >60)
Glucose, Bld: 80 mg/dL (ref 70–99)
Potassium: 3.2 mmol/L — ABNORMAL LOW (ref 3.5–5.1)
Sodium: 138 mmol/L (ref 135–145)

## 2022-09-19 LAB — CBC
HCT: 31.1 % — ABNORMAL LOW (ref 36.0–46.0)
Hemoglobin: 10.1 g/dL — ABNORMAL LOW (ref 12.0–15.0)
MCH: 27.6 pg (ref 26.0–34.0)
MCHC: 32.5 g/dL (ref 30.0–36.0)
MCV: 85 fL (ref 80.0–100.0)
Platelets: 443 10*3/uL — ABNORMAL HIGH (ref 150–400)
RBC: 3.66 MIL/uL — ABNORMAL LOW (ref 3.87–5.11)
RDW: 15.9 % — ABNORMAL HIGH (ref 11.5–15.5)
WBC: 6.7 10*3/uL (ref 4.0–10.5)
nRBC: 0.4 % — ABNORMAL HIGH (ref 0.0–0.2)

## 2022-09-19 LAB — MAGNESIUM: Magnesium: 1.6 mg/dL — ABNORMAL LOW (ref 1.7–2.4)

## 2022-09-19 MED ORDER — MAGNESIUM SULFATE 2 GM/50ML IV SOLN
2.0000 g | Freq: Once | INTRAVENOUS | Status: AC
  Administered 2022-09-19: 2 g via INTRAVENOUS
  Filled 2022-09-19: qty 50

## 2022-09-19 MED ORDER — POTASSIUM CHLORIDE IN NACL 40-0.9 MEQ/L-% IV SOLN: INTRAVENOUS | Status: DC

## 2022-09-19 NOTE — Progress Notes (Signed)
Subjective: No bowel movement or flatus overnight.  Objective: Vital signs in last 24 hours: Temp:  [97.6 F (36.4 C)-98.9 F (37.2 C)] 97.6 F (36.4 C) (07/15 0422) Pulse Rate:  [99-106] 103 (07/15 0422) Resp:  [14-18] 14 (07/15 0422) BP: (119-142)/(55-77) 134/77 (07/15 0422) SpO2:  [92 %-97 %] 97 % (07/15 0422) Last BM Date : 09/17/22  Intake/Output from previous day: 07/14 0701 - 07/15 0700 In: 1602.7 [P.O.:120; I.V.:1482.7] Out: 900 [Emesis/NG output:900] Intake/Output this shift: No intake/output data recorded.  General appearance: alert, cooperative, and no distress GI: Soft, distended but not tense.  No rigidity noted.  Some tenderness to palpation.  No significant bowel sounds appreciated.  Lab Results:  Recent Labs    09/18/22 0415 09/19/22 0500  WBC 10.2 6.7  HGB 10.8* 10.1*  HCT 33.8* 31.1*  PLT 493* 443*   BMET Recent Labs    09/18/22 0415 09/19/22 0500  NA 137 138  K 3.2* 3.2*  CL 99 100  CO2 28 29  GLUCOSE 107* 80  BUN 9 12  CREATININE 0.60 0.59  CALCIUM 8.1* 8.1*   PT/INR No results for input(s): "LABPROT", "INR" in the last 72 hours.  Studies/Results: DG Abd 1 View  Result Date: 09/18/2022 CLINICAL DATA:  Check gastric catheter placement EXAM: ABDOMEN - 1 VIEW COMPARISON:  None Available. FINDINGS: Gastric catheter extends into the stomach. The proximal side port now lies within the gastric lumen. Lungs are hypoinflated but clear. No bony abnormality is noted. IMPRESSION: Gastric catheter in satisfactory position. Electronically Signed   By: Alcide Clever M.D.   On: 09/18/2022 00:50   DG Abd 1 View  Result Date: 09/17/2022 CLINICAL DATA:  Check gastric catheter placement EXAM: ABDOMEN - 1 VIEW COMPARISON:  None Available. FINDINGS: Gastric catheter is noted with the tip in the stomach. Proximal side port lies at the gastroesophageal junction. This should be advanced deeper into the stomach. Lungs are clear but hypoinflated. No free air is  noted in the abdomen. IMPRESSION: Gastric catheter as described. This should be advanced deeper into the stomach. Electronically Signed   By: Alcide Clever M.D.   On: 09/17/2022 21:52   DG Abd Portable 1V  Result Date: 09/17/2022 CLINICAL DATA:  NG tube placement. EXAM: PORTABLE ABDOMEN - 1 VIEW COMPARISON:  October 28, 2017 FINDINGS: Enteric catheter terminates at the expected location of gastric body, side hole at the level of the GE junction. Partially visualized distension of small bowels. Elevation of the left hemidiaphragm. IMPRESSION: Enteric catheter terminates at the expected location of gastric body, side hole at the level of the GE junction. Advancement of the enteric catheter is recommended. Electronically Signed   By: Ted Mcalpine M.D.   On: 09/17/2022 19:06   CT ABDOMEN PELVIS W CONTRAST  Result Date: 09/17/2022 CLINICAL DATA:  Abdominal pain.  History of Crohn's disease. EXAM: CT ABDOMEN AND PELVIS WITH CONTRAST TECHNIQUE: Multidetector CT imaging of the abdomen and pelvis was performed using the standard protocol following bolus administration of intravenous contrast. RADIATION DOSE REDUCTION: This exam was performed according to the departmental dose-optimization program which includes automated exposure control, adjustment of the mA and/or kV according to patient size and/or use of iterative reconstruction technique. CONTRAST:  OMNIPAQUE IOHEXOL 300 MG/ML  SOLN COMPARISON:  October 28, 2017 FINDINGS: Lower chest: No acute abnormality. Hepatobiliary: Diffuse intrahepatic biliary ductal dilation. Post cholecystectomy. Pancreas: Unremarkable. No pancreatic ductal dilatation or surrounding inflammatory changes. Spleen: Normal in size without focal abnormality. Adrenals/Urinary Tract: Adrenal  glands are unremarkable. Kidneys are normal, without renal calculi, focal lesion, or hydronephrosis. Bladder is unremarkable. 1.5 cm benign right renal cyst. Stomach/Bowel: The stomach is  fluid-filled and moderately distended. Diffuse distension of small bowel loops which are also fluid-filled to the level of the distal ileum. Previous resection of the terminal ileum. Marked inflammatory changes and bowel wall thickening at the anastomosis causing a high-grade small bowel obstruction. The small bowel loops upstream of dilated to 5.3 cm. The more distal portion of the ileum demonstrates diffuse mucosal thickening. The colon is decompressed. Vascular/Lymphatic: No significant vascular findings are present. No enlarged abdominal or pelvic lymph nodes. Reproductive: Uterus and bilateral adnexa are unremarkable. Other: No abdominal wall hernia or abnormality. No abdominopelvic ascites. Musculoskeletal: Spondylosis at L5-S1. IMPRESSION: 1. High-grade small bowel obstruction secondary to marked inflammatory changes and bowel wall thickening versus volvulus at the anastomosis of the terminal ileum and right colon. 2. Diffuse mucosal thickening of the more distal portion of the ileum. 3. Diffuse intrahepatic biliary ductal dilation. 4. Post cholecystectomy. 5. Spondylosis at L5-S1. Electronically Signed   By: Ted Mcalpine M.D.   On: 09/17/2022 15:06    Anti-infectives: Anti-infectives (From admission, onward)    None       Assessment/Plan: Impression: Small bowel obstruction secondary to acute exacerbation of Crohn's disease. Plan: No need for acute surgical intervention at the present time.  Continue treating medically and with NG tube decompression.  Will treat hypokalemia and hypomagnesemia.  LOS: 2 days    Franky Macho 09/19/2022

## 2022-09-19 NOTE — Progress Notes (Signed)
PROGRESS NOTE  [name]:[id number] DOB: 08-05-1970 DOA: 09/17/2022 PCP: Kerri Perches, MD  Brief History:  52 year old female with a history of Crohn's disease on chronic Humira status post ileocolonic resection, GERD, bipolar disorder, OSA presenting with abdominal pain that began on 09/16/2022.  She had associated nausea and vomiting.  She denied fevers, chills, chest pain, shortness of breath.  She states that her abdominal pain was similar to her previous Crohn's exacerbations.  Because of worsening pain and vomiting, she presented for further evaluation and treatment. In the ED, the patient was afebrile and hemodynamically stable with oxygen saturation 90% room air.  WBC 14.1, hemoglobin 12.1, platelets 520,000.  Sodium 137, potassium 3.2, bicarbonate 28, serum creatinine 0.60.  LFTs were unremarkable.  CT of the abdomen and pelvis showed high-grade SBO secondary to marked inflammatory changes with bowel wall thickening at the level of the terminal ileum and right colon anastomosis.  There was diffuse mucosal thickening of the distal ileum. The patient was given a dose of IV Solu-Medrol.  GI and general surgery consulted to assist with management.  Upon further questioning, it was noted the patient had not received the Humira dose since April 2024.  05/10/2021 colonoscopy--noted the opening to the neoterminal ileum was very narrow; unable to pass/intubate with pediatric colonoscope   Assessment/Plan: Crohn's disease exacerbation/Small Bowel Obstruction -Appreciate GI consultation -Appreciate general surgery consult -Case discussed with Drs. Marletta Lor and Henreitta Leber -The patient's current obstruction secondary to Crohn's exacerbation with inflammatory changes worsening her established stricture at her anastomosis -Continue IV Solu-Medrol -Continue IV fluids -judious opioids -Continue NG decompression   Hypokalemia/Hypomagnesemia -Replete -check mag   Bipolar  disorder -Restart Effexor and vistaril once able to tolerate p.o.                   Family Communication: spouse updated at bedside 7/15   Consultants:  GI, general surgery   Code Status:  FULL    DVT Prophylaxis:   [place] Lovenox     Procedures: As Listed in Progress Note Above   Antibiotics: None    Subjective: Pt states abd pain is slowly improving.  Still feels "bloated".  Denies f/c, cp, sob, n/v/d.  No flatus or BM yet  Objective: Vitals:   09/18/22 1200 09/18/22 1940 09/19/22 0422 09/19/22 1418  BP: 120/76 (!) 142/77 134/77 (!) 141/70  Pulse: (!) 106 99 (!) 103 100  Resp:  18 14 18   Temp: 98.9 F (37.2 C) 98.7 F (37.1 C) 97.6 F (36.4 C) 98.4 F (36.9 C)  TempSrc:  Oral Oral Oral  SpO2: 95% 94% 97% 95%  Weight:      Height:        Intake/Output Summary (Last 24 hours) at 09/19/2022 1546 Last data filed at 09/19/2022 1330 Gross per 24 hour  Intake 1762.73 ml  Output 900 ml  Net 862.73 ml   Weight change:  Exam:  General:  Pt is alert, follows commands appropriately, not in acute distress HEENT: No icterus, No thrush, No neck mass, [place]/AT Cardiovascular: RRR, S1/S2, no rubs, no gallops Respiratory: CTA bilaterally, no wheezing, no crackles, no rhonchi Abdomen: Soft/+BS, mild diffuse tender, non distended, no guarding Extremities: No edema, No lymphangitis, No petechiae, No rashes, no synovitis   Data Reviewed: I have personally reviewed following labs and imaging studies Basic Metabolic Panel: Recent Labs  Lab 09/17/22 1145 09/17/22 1155 09/18/22 0415 09/19/22 0500  NA  --  139 137 138  K  --  3.0* 3.2* 3.2*  CL  --  97* 99 100  CO2  --  29 28 29   GLUCOSE  --  101* 107* 80  BUN  --  8 9 12   CREATININE  --  0.63 0.60 0.59  CALCIUM  --  8.8* 8.1* 8.1*  MG 1.9  --   --  1.6*   Liver Function Tests: Recent Labs  Lab 09/17/22 1155  AST 19  ALT 20  ALKPHOS 206*  BILITOT 0.4  PROT 7.8  ALBUMIN 2.8*   Recent Labs  Lab  09/17/22 1155  LIPASE 25   No results for input(s): "AMMONIA" in the last 168 hours. Coagulation Profile: No results for input(s): "INR", "PROTIME" in the last 168 hours. CBC: Recent Labs  Lab 09/17/22 1155 09/18/22 0415 09/19/22 0500  WBC 14.1* 10.2 6.7  HGB 12.1 10.8* 10.1*  HCT 37.4 33.8* 31.1*  MCV 82.6 84.5 85.0  PLT 520* 493* 443*   Cardiac Enzymes: No results for input(s): "CKTOTAL", "CKMB", "CKMBINDEX", "TROPONINI" in the last 168 hours. BNP: Invalid input(s): "POCBNP" CBG: No results for input(s): "GLUCAP" in the last 168 hours. HbA1C: No results for input(s): "HGBA1C" in the last 72 hours. Urine analysis:    Component Value Date/Time   COLORURINE YELLOW 09/17/2022 1200   APPEARANCEUR CLEAR 09/17/2022 1200   LABSPEC 1.023 09/17/2022 1200   PHURINE 5.0 09/17/2022 1200   GLUCOSEU NEGATIVE 09/17/2022 1200   HGBUR NEGATIVE 09/17/2022 1200   HGBUR negative 08/10/2009 1013   BILIRUBINUR NEGATIVE 09/17/2022 1200   KETONESUR NEGATIVE 09/17/2022 1200   PROTEINUR 30 (A) 09/17/2022 1200   UROBILINOGEN 0.2 10/17/2010 1249   NITRITE NEGATIVE 09/17/2022 1200   LEUKOCYTESUR NEGATIVE 09/17/2022 1200   Sepsis Labs: @LABRCNTIP (procalcitonin:4,lacticidven:4) )No results found for this or any previous visit (from the past 240 hour(s)).   Scheduled Meds:  enoxaparin (LOVENOX) injection  40 mg Subcutaneous Q24H   methylPREDNISolone (SOLU-MEDROL) injection  60 mg Intravenous Daily   Continuous Infusions:  lactated ringers 125 mL/hr at 09/19/22 0145    Procedures/Studies: DG Abd 1 View  Result Date: 09/18/2022 CLINICAL DATA:  Check gastric catheter placement EXAM: ABDOMEN - 1 VIEW COMPARISON:  None Available. FINDINGS: Gastric catheter extends into the stomach. The proximal side port now lies within the gastric lumen. Lungs are hypoinflated but clear. No bony abnormality is noted. IMPRESSION: Gastric catheter in satisfactory position. Electronically Signed   By: Alcide Clever M.D.   On: 09/18/2022 00:50   DG Abd 1 View  Result Date: 09/17/2022 CLINICAL DATA:  Check gastric catheter placement EXAM: ABDOMEN - 1 VIEW COMPARISON:  None Available. FINDINGS: Gastric catheter is noted with the tip in the stomach. Proximal side port lies at the gastroesophageal junction. This should be advanced deeper into the stomach. Lungs are clear but hypoinflated. No free air is noted in the abdomen. IMPRESSION: Gastric catheter as described. This should be advanced deeper into the stomach. Electronically Signed   By: Alcide Clever M.D.   On: 09/17/2022 21:52   DG Abd Portable 1V  Result Date: 09/17/2022 CLINICAL DATA:  NG tube placement. EXAM: PORTABLE ABDOMEN - 1 VIEW COMPARISON:  October 28, 2017 FINDINGS: Enteric catheter terminates at the expected location of gastric body, side hole at the level of the GE junction. Partially visualized distension of small bowels. Elevation of the left hemidiaphragm. IMPRESSION: Enteric catheter terminates at the expected location of gastric body, side hole at the level of the  GE junction. Advancement of the enteric catheter is recommended. Electronically Signed   By: Ted Mcalpine M.D.   On: 09/17/2022 19:06   CT ABDOMEN PELVIS W CONTRAST  Result Date: 09/17/2022 CLINICAL DATA:  Abdominal pain.  History of Crohn's disease. EXAM: CT ABDOMEN AND PELVIS WITH CONTRAST TECHNIQUE: Multidetector CT imaging of the abdomen and pelvis was performed using the standard protocol following bolus administration of intravenous contrast. RADIATION DOSE REDUCTION: This exam was performed according to the departmental dose-optimization program which includes automated exposure control, adjustment of the mA and/or kV according to patient size and/or use of iterative reconstruction technique. CONTRAST:  OMNIPAQUE IOHEXOL 300 MG/ML  SOLN COMPARISON:  October 28, 2017 FINDINGS: Lower chest: No acute abnormality. Hepatobiliary: Diffuse intrahepatic biliary ductal  dilation. Post cholecystectomy. Pancreas: Unremarkable. No pancreatic ductal dilatation or surrounding inflammatory changes. Spleen: Normal in size without focal abnormality. Adrenals/Urinary Tract: Adrenal glands are unremarkable. Kidneys are normal, without renal calculi, focal lesion, or hydronephrosis. Bladder is unremarkable. 1.5 cm benign right renal cyst. Stomach/Bowel: The stomach is fluid-filled and moderately distended. Diffuse distension of small bowel loops which are also fluid-filled to the level of the distal ileum. Previous resection of the terminal ileum. Marked inflammatory changes and bowel wall thickening at the anastomosis causing a high-grade small bowel obstruction. The small bowel loops upstream of dilated to 5.3 cm. The more distal portion of the ileum demonstrates diffuse mucosal thickening. The colon is decompressed. Vascular/Lymphatic: No significant vascular findings are present. No enlarged abdominal or pelvic lymph nodes. Reproductive: Uterus and bilateral adnexa are unremarkable. Other: No abdominal wall hernia or abnormality. No abdominopelvic ascites. Musculoskeletal: Spondylosis at L5-S1. IMPRESSION: 1. High-grade small bowel obstruction secondary to marked inflammatory changes and bowel wall thickening versus volvulus at the anastomosis of the terminal ileum and right colon. 2. Diffuse mucosal thickening of the more distal portion of the ileum. 3. Diffuse intrahepatic biliary ductal dilation. 4. Post cholecystectomy. 5. Spondylosis at L5-S1. Electronically Signed   By: Ted Mcalpine M.D.   On: 09/17/2022 15:06    Catarina Hartshorn, DO  [facility]  If 7PM-7AM, please contact night-coverage [email or web address] Password [facility] 09/19/2022, 3:46 PM   LOS: 2 days

## 2022-09-19 NOTE — Progress Notes (Signed)
Pt was able to have bowel movement. MD notified.

## 2022-09-19 NOTE — Progress Notes (Signed)
Subjective: Reports her abdominal pain is much improved. Only with mild pain, worse in the lower abdomen. Hasn't had a BM or passed any gas yet, but has heard her stomach making some noises this morning. Little nausea this morning, but didn't need anything for it.   States she lost her son in May. She is still dealing with grief but also with financial things with his estate. She states she will not be able to afford Humira as it was costing her $200. States she doesn't want to deal with a bunch of paperwork right now. She would like to try something else that is more affordable. Reports she was previously on Remicade, but this caused nausea/vomiting.   Objective: Vital signs in last 24 hours: Temp:  [97.6 F (36.4 C)-98.9 F (37.2 C)] 97.6 F (36.4 C) (07/15 0422) Pulse Rate:  [99-106] 103 (07/15 0422) Resp:  [14-18] 14 (07/15 0422) BP: (120-142)/(76-77) 134/77 (07/15 0422) SpO2:  [94 %-97 %] 97 % (07/15 0422) Last BM Date : 09/17/22 General:   Alert and oriented, pleasant, NAD. NG tube in place.  Head:  Normocephalic and atraumatic. Abdomen:  Minimal bowel sounds, full but soft. Mild generalized TTO, greates across lower abdomen. No masses appreciated  Extremities:  Without edema. Neurologic:  Alert and  oriented x4;  grossly normal neurologically. Psych:   Normal mood and affect.  Intake/Output from previous day: 07/14 0701 - 07/15 0700 In: 1602.7 [P.O.:120; I.V.:1482.7] Out: 900 [Emesis/NG output:900] Intake/Output this shift: No intake/output data recorded.  Lab Results: Recent Labs    09/17/22 1155 09/18/22 0415 09/19/22 0500  WBC 14.1* 10.2 6.7  HGB 12.1 10.8* 10.1*  HCT 37.4 33.8* 31.1*  PLT 520* 493* 443*   BMET Recent Labs    09/17/22 1155 09/18/22 0415 09/19/22 0500  NA 139 137 138  K 3.0* 3.2* 3.2*  CL 97* 99 100  CO2 29 28 29   GLUCOSE 101* 107* 80  BUN 8 9 12   CREATININE 0.63 0.60 0.59  CALCIUM 8.8* 8.1* 8.1*   LFT Recent Labs     09/17/22 1155  PROT 7.8  ALBUMIN 2.8*  AST 19  ALT 20  ALKPHOS 206*  BILITOT 0.4    Studies/Results: DG Abd 1 View  Result Date: 09/18/2022 CLINICAL DATA:  Check gastric catheter placement EXAM: ABDOMEN - 1 VIEW COMPARISON:  None Available. FINDINGS: Gastric catheter extends into the stomach. The proximal side port now lies within the gastric lumen. Lungs are hypoinflated but clear. No bony abnormality is noted. IMPRESSION: Gastric catheter in satisfactory position. Electronically Signed   By: Alcide Clever M.D.   On: 09/18/2022 00:50   DG Abd 1 View  Result Date: 09/17/2022 CLINICAL DATA:  Check gastric catheter placement EXAM: ABDOMEN - 1 VIEW COMPARISON:  None Available. FINDINGS: Gastric catheter is noted with the tip in the stomach. Proximal side port lies at the gastroesophageal junction. This should be advanced deeper into the stomach. Lungs are clear but hypoinflated. No free air is noted in the abdomen. IMPRESSION: Gastric catheter as described. This should be advanced deeper into the stomach. Electronically Signed   By: Alcide Clever M.D.   On: 09/17/2022 21:52   DG Abd Portable 1V  Result Date: 09/17/2022 CLINICAL DATA:  NG tube placement. EXAM: PORTABLE ABDOMEN - 1 VIEW COMPARISON:  October 28, 2017 FINDINGS: Enteric catheter terminates at the expected location of gastric body, side hole at the level of the GE junction. Partially visualized distension of small bowels. Elevation  of the left hemidiaphragm. IMPRESSION: Enteric catheter terminates at the expected location of gastric body, side hole at the level of the GE junction. Advancement of the enteric catheter is recommended. Electronically Signed   By: Ted Mcalpine M.D.   On: 09/17/2022 19:06   CT ABDOMEN PELVIS W CONTRAST  Result Date: 09/17/2022 CLINICAL DATA:  Abdominal pain.  History of Crohn's disease. EXAM: CT ABDOMEN AND PELVIS WITH CONTRAST TECHNIQUE: Multidetector CT imaging of the abdomen and pelvis was performed  using the standard protocol following bolus administration of intravenous contrast. RADIATION DOSE REDUCTION: This exam was performed according to the departmental dose-optimization program which includes automated exposure control, adjustment of the mA and/or kV according to patient size and/or use of iterative reconstruction technique. CONTRAST:  OMNIPAQUE IOHEXOL 300 MG/ML  SOLN COMPARISON:  October 28, 2017 FINDINGS: Lower chest: No acute abnormality. Hepatobiliary: Diffuse intrahepatic biliary ductal dilation. Post cholecystectomy. Pancreas: Unremarkable. No pancreatic ductal dilatation or surrounding inflammatory changes. Spleen: Normal in size without focal abnormality. Adrenals/Urinary Tract: Adrenal glands are unremarkable. Kidneys are normal, without renal calculi, focal lesion, or hydronephrosis. Bladder is unremarkable. 1.5 cm benign right renal cyst. Stomach/Bowel: The stomach is fluid-filled and moderately distended. Diffuse distension of small bowel loops which are also fluid-filled to the level of the distal ileum. Previous resection of the terminal ileum. Marked inflammatory changes and bowel wall thickening at the anastomosis causing a high-grade small bowel obstruction. The small bowel loops upstream of dilated to 5.3 cm. The more distal portion of the ileum demonstrates diffuse mucosal thickening. The colon is decompressed. Vascular/Lymphatic: No significant vascular findings are present. No enlarged abdominal or pelvic lymph nodes. Reproductive: Uterus and bilateral adnexa are unremarkable. Other: No abdominal wall hernia or abnormality. No abdominopelvic ascites. Musculoskeletal: Spondylosis at L5-S1. IMPRESSION: 1. High-grade small bowel obstruction secondary to marked inflammatory changes and bowel wall thickening versus volvulus at the anastomosis of the terminal ileum and right colon. 2. Diffuse mucosal thickening of the more distal portion of the ileum. 3. Diffuse intrahepatic biliary  ductal dilation. 4. Post cholecystectomy. 5. Spondylosis at L5-S1. Electronically Signed   By: Ted Mcalpine M.D.   On: 09/17/2022 15:06    Assessment: 52 y.o. female with a past medical history of ileocolonic Crohn's disease status post ilio colonic resection in 2002 chronically on weekly Humira, GERD, peptic ulcer disease, bipolar, sleep apnea, who presented to [facility] 7/13 with abdominal pain, nausea, found to have high-grade small bowel obstruction secondary to marked inflammation changes and bowel wall thickening at the ileocolonic anastomosis on CT.   SBO/Crohn's flare: SBO secondary to crohn's flare, off Humira since April 2024 due to cost ($200). Currently with NG tube in place and on IV solumedrol 60 mg daily starting 7/14. She hasn't had a BM or passed flatus yet, but report her abdominal pain is improving. Surgery is following. No indication for acute surgical intervention at this time. If surgery is needed, would benefit from IBD/colorectal surgeon/multidisciplinary team. Hopefully, we will see improvement with steroids over the next few days.   Regarding her maintenance therapy, initially recommend resuming Humira outpatient, but she is requesting to try a different medication due to cost. Previously on Remicade, but had adverse reaction (nausea/vomiting)Will discuss case with Dr. Marletta Lor and also see if we can find out what is on her formulary.   Notably, last  05/10/2021 with evidence of prior surgical resection with chronic surgical changes, stricture unable to be intubated with colonoscope, small ulcer and small nodule  which were biopsied. Pathology showed chronic active inflammation thought to be due to NSAIDs.    Hypokalemia/hypomagnesemia:  K 3.2 and mag 1.6 this morning. Management per hospitalist.    Plan: Continue IV solumedrol 60 mg daily.  Keep NG tube in place.  NPO Discuss Crohn's maintenance therapy with Dr. Marletta Lor. Will also reach out to our office to  see if we can find out what is on her formulary.  Appreciate general surgery input.  Given the stricture noted on prior colonoscopy, consider seeing IBD surgeon in the outpatient setting to discuss possible further resection. Hypokalemia/hypomagnesemia management per hospitalist.    LOS: 2 days    09/19/2022, 9:41 AM   Ermalinda Memos, PA-C [facility]

## 2022-09-20 DIAGNOSIS — K56609 Unspecified intestinal obstruction, unspecified as to partial versus complete obstruction: Secondary | ICD-10-CM | POA: Diagnosis not present

## 2022-09-20 DIAGNOSIS — K50812 Crohn's disease of both small and large intestine with intestinal obstruction: Secondary | ICD-10-CM | POA: Diagnosis not present

## 2022-09-20 LAB — BASIC METABOLIC PANEL
Anion gap: 10 (ref 5–15)
BUN: 12 mg/dL (ref 6–20)
CO2: 28 mmol/L (ref 22–32)
Calcium: 8.1 mg/dL — ABNORMAL LOW (ref 8.9–10.3)
Chloride: 104 mmol/L (ref 98–111)
Creatinine, Ser: 0.67 mg/dL (ref 0.44–1.00)
GFR, Estimated: >60 mL/min (ref >60)
Glucose, Bld: 70 mg/dL (ref 70–99)
Potassium: 3.8 mmol/L (ref 3.5–5.1)
Sodium: 142 mmol/L (ref 135–145)

## 2022-09-20 LAB — MAGNESIUM: Magnesium: 2 mg/dL (ref 1.7–2.4)

## 2022-09-20 MED ORDER — PROCHLORPERAZINE EDISYLATE 10 MG/2ML IJ SOLN
5.0000 mg | Freq: Once | INTRAMUSCULAR | Status: AC
  Administered 2022-09-20: 5 mg via INTRAVENOUS
  Filled 2022-09-20: qty 2

## 2022-09-20 NOTE — Progress Notes (Signed)
  Subjective: Patient states her abdominal distention is less.  She did have a bowel movement yesterday evening.  Objective: Vital signs in last 24 hours: Temp:  [97.5 F (36.4 C)-98.4 F (36.9 C)] 97.8 F (36.6 C) (07/16 0416) Pulse Rate:  [89-100] 89 (07/16 0416) Resp:  [18] 18 (07/16 0416) BP: (136-141)/(70-75) 138/72 (07/16 0416) SpO2:  [95 %-97 %] 97 % (07/16 0416) Last BM Date : 09/19/22  Intake/Output from previous day: 07/15 0701 - 07/16 0700 In: 1341.3 [P.O.:160; I.V.:1181.3] Out: 950 [Emesis/NG output:950] Intake/Output this shift: No intake/output data recorded.  General appearance: alert, cooperative, and no distress GI: Soft, less distended.  No point tenderness noted.  No rigidity is noted.  Lab Results:  Recent Labs    09/18/22 0415 09/19/22 0500  WBC 10.2 6.7  HGB 10.8* 10.1*  HCT 33.8* 31.1*  PLT 493* 443*   BMET Recent Labs    09/19/22 0500 09/20/22 0412  NA 138 142  K 3.2* 3.8  CL 100 104  CO2 29 28  GLUCOSE 80 70  BUN 12 12  CREATININE 0.59 0.67  CALCIUM 8.1* 8.1*   PT/INR No results for input(s): "LABPROT", "INR" in the last 72 hours.  Studies/Results: No results found.  Anti-infectives: Anti-infectives (From admission, onward)    None       Assessment/Plan: Impression: Small bowel obstruction secondary to Crohn's exacerbation.  Patient did have a bowel movement and seems to be improving.  She still has significant NG tube output.  She would like the NG tube to be left in, which is fine with me.  Will continue to follow expectantly.  LOS: 3 days    Franky Macho 09/20/2022

## 2022-09-20 NOTE — Progress Notes (Signed)
Patient received dilaudid once this shift for abdominal pain. Patient slept rest of shift. output from NG tube, Brown in color. Continued to monitor.

## 2022-09-20 NOTE — Progress Notes (Signed)
PROGRESS NOTE  DANDRA SHAMBAUGH UJW:[id number] DOB: 1971/02/19 DOA: 09/17/2022 PCP: Kerri Perches, MD  Brief History:  52 year old female with a history of Crohn's disease on chronic Humira status post ileocolonic resection, GERD, bipolar disorder, OSA presenting with abdominal pain that began on 09/16/2022.  She had associated nausea and vomiting.  She denied fevers, chills, chest pain, shortness of breath.  She states that her abdominal pain was similar to her previous Crohn's exacerbations.  Because of worsening pain and vomiting, she presented for further evaluation and treatment. In the ED, the patient was afebrile and hemodynamically stable with oxygen saturation 90% room air.  WBC 14.1, hemoglobin 12.1, platelets 520,000.  Sodium 137, potassium 3.2, bicarbonate 28, serum creatinine 0.60.  LFTs were unremarkable.  CT of the abdomen and pelvis showed high-grade SBO secondary to marked inflammatory changes with bowel wall thickening at the level of the terminal ileum and right colon anastomosis.  There was diffuse mucosal thickening of the distal ileum. The patient was given a dose of IV Solu-Medrol.  GI and general surgery consulted to assist with management.  Upon further questioning, it was noted the patient had not received the Humira dose since April 2024.  05/10/2021 colonoscopy--noted the opening to the neoterminal ileum was very narrow; unable to pass/intubate with pediatric colonoscope   Assessment/Plan: Crohn's disease exacerbation/Small Bowel Obstruction -Appreciate GI consultation -Appreciate general surgery consult -Case discussed with Drs. Marletta Lor and Henreitta Leber -The patient's current obstruction secondary to Crohn's exacerbation with inflammatory changes worsening her established stricture at her anastomosis -Continue IV Solu-Medrol -Continue IV fluids -judious opioids -Continue NG decompression>>NG output ~900 every 24 hours -slowly improving with soft abd--2 BMs on  7/16; one BM on 7/15   Hypokalemia/Hypomagnesemia -Replete -check mag 2.0   Bipolar disorder -Restart Effexor and vistaril once able to tolerate p.o.                   Family Communication: spouse updated at bedside 7/16   Consultants:  GI, general surgery   Code Status:  FULL    DVT Prophylaxis:   [place] Lovenox     Procedures: As Listed in Progress Note Above   Antibiotics: None       Subjective:  Pt feels like abd is improving and softer.  Denies vomiting but feels nausea.  Denies f/c, cp, sob.  2 BMs today.  Abd pain is improving Objective: Vitals:   09/19/22 1418 09/19/22 2133 09/20/22 0416 09/20/22 1356  BP: (!) 141/70 136/75 138/72 (!) 151/77  Pulse: 100 91 89 100  Resp: 18 18 18 16   Temp: 98.4 F (36.9 C) (!) 97.5 F (36.4 C) 97.8 F (36.6 C) 98.7 F (37.1 C)  TempSrc: Oral Oral  Oral  SpO2: 95% 96% 97% 98%  Weight:      Height:        Intake/Output Summary (Last 24 hours) at 09/20/2022 1718 Last data filed at 09/20/2022 1505 Gross per 24 hour  Intake 2551.16 ml  Output 950 ml  Net 1601.16 ml   Weight change:  Exam:  General:  Pt is alert, follows commands appropriately, not in acute distress HEENT: No icterus, No thrush, No neck mass, [place]/AT Cardiovascular: RRR, S1/S2, no rubs, no gallops Respiratory: CTA bilaterally, no wheezing, no crackles, no rhonchi Abdomen: Soft/+BS, non tender, non distended, no guarding Extremities: No edema, No lymphangitis, No petechiae, No rashes, no synovitis   Data Reviewed: I have personally reviewed following  labs and imaging studies Basic Metabolic Panel: Recent Labs  Lab 09/17/22 1145 09/17/22 1155 09/18/22 0415 09/19/22 0500 09/20/22 0412  NA  --  139 137 138 142  K  --  3.0* 3.2* 3.2* 3.8  CL  --  97* 99 100 104  CO2  --  29 28 29 28   GLUCOSE  --  101* 107* 80 70  BUN  --  8 9 12 12   CREATININE  --  0.63 0.60 0.59 0.67  CALCIUM  --  8.8* 8.1* 8.1* 8.1*  MG 1.9  --   --  1.6* 2.0    Liver Function Tests: Recent Labs  Lab 09/17/22 1155  AST 19  ALT 20  ALKPHOS 206*  BILITOT 0.4  PROT 7.8  ALBUMIN 2.8*   Recent Labs  Lab 09/17/22 1155  LIPASE 25   No results for input(s): "AMMONIA" in the last 168 hours. Coagulation Profile: No results for input(s): "INR", "PROTIME" in the last 168 hours. CBC: Recent Labs  Lab 09/17/22 1155 09/18/22 0415 09/19/22 0500  WBC 14.1* 10.2 6.7  HGB 12.1 10.8* 10.1*  HCT 37.4 33.8* 31.1*  MCV 82.6 84.5 85.0  PLT 520* 493* 443*   Cardiac Enzymes: No results for input(s): "CKTOTAL", "CKMB", "CKMBINDEX", "TROPONINI" in the last 168 hours. BNP: Invalid input(s): "POCBNP" CBG: No results for input(s): "GLUCAP" in the last 168 hours. HbA1C: No results for input(s): "HGBA1C" in the last 72 hours. Urine analysis:    Component Value Date/Time   COLORURINE YELLOW 09/17/2022 1200   APPEARANCEUR CLEAR 09/17/2022 1200   LABSPEC 1.023 09/17/2022 1200   PHURINE 5.0 09/17/2022 1200   GLUCOSEU NEGATIVE 09/17/2022 1200   HGBUR NEGATIVE 09/17/2022 1200   HGBUR negative 08/10/2009 1013   BILIRUBINUR NEGATIVE 09/17/2022 1200   KETONESUR NEGATIVE 09/17/2022 1200   PROTEINUR 30 (A) 09/17/2022 1200   UROBILINOGEN 0.2 10/17/2010 1249   NITRITE NEGATIVE 09/17/2022 1200   LEUKOCYTESUR NEGATIVE 09/17/2022 1200   Sepsis Labs: @LABRCNTIP (procalcitonin:4,lacticidven:4) )No results found for this or any previous visit (from the past 240 hour(s)).   Scheduled Meds:  enoxaparin (LOVENOX) injection  40 mg Subcutaneous Q24H   methylPREDNISolone (SOLU-MEDROL) injection  60 mg Intravenous Daily   Continuous Infusions:  0.9 % NaCl with KCl 40 mEq / L 125 mL/hr at 09/20/22 1505    Procedures/Studies: DG Abd 1 View  Result Date: 09/18/2022 CLINICAL DATA:  Check gastric catheter placement EXAM: ABDOMEN - 1 VIEW COMPARISON:  None Available. FINDINGS: Gastric catheter extends into the stomach. The proximal side port now lies within the  gastric lumen. Lungs are hypoinflated but clear. No bony abnormality is noted. IMPRESSION: Gastric catheter in satisfactory position. Electronically Signed   By: Alcide Clever M.D.   On: 09/18/2022 00:50   DG Abd 1 View  Result Date: 09/17/2022 CLINICAL DATA:  Check gastric catheter placement EXAM: ABDOMEN - 1 VIEW COMPARISON:  None Available. FINDINGS: Gastric catheter is noted with the tip in the stomach. Proximal side port lies at the gastroesophageal junction. This should be advanced deeper into the stomach. Lungs are clear but hypoinflated. No free air is noted in the abdomen. IMPRESSION: Gastric catheter as described. This should be advanced deeper into the stomach. Electronically Signed   By: Alcide Clever M.D.   On: 09/17/2022 21:52   DG Abd Portable 1V  Result Date: 09/17/2022 CLINICAL DATA:  NG tube placement. EXAM: PORTABLE ABDOMEN - 1 VIEW COMPARISON:  October 28, 2017 FINDINGS: Enteric catheter terminates at the expected  location of gastric body, side hole at the level of the GE junction. Partially visualized distension of small bowels. Elevation of the left hemidiaphragm. IMPRESSION: Enteric catheter terminates at the expected location of gastric body, side hole at the level of the GE junction. Advancement of the enteric catheter is recommended. Electronically Signed   By: Ted Mcalpine M.D.   On: 09/17/2022 19:06   CT ABDOMEN PELVIS W CONTRAST  Result Date: 09/17/2022 CLINICAL DATA:  Abdominal pain.  History of Crohn's disease. EXAM: CT ABDOMEN AND PELVIS WITH CONTRAST TECHNIQUE: Multidetector CT imaging of the abdomen and pelvis was performed using the standard protocol following bolus administration of intravenous contrast. RADIATION DOSE REDUCTION: This exam was performed according to the departmental dose-optimization program which includes automated exposure control, adjustment of the mA and/or kV according to patient size and/or use of iterative reconstruction technique. CONTRAST:   OMNIPAQUE IOHEXOL 300 MG/ML  SOLN COMPARISON:  October 28, 2017 FINDINGS: Lower chest: No acute abnormality. Hepatobiliary: Diffuse intrahepatic biliary ductal dilation. Post cholecystectomy. Pancreas: Unremarkable. No pancreatic ductal dilatation or surrounding inflammatory changes. Spleen: Normal in size without focal abnormality. Adrenals/Urinary Tract: Adrenal glands are unremarkable. Kidneys are normal, without renal calculi, focal lesion, or hydronephrosis. Bladder is unremarkable. 1.5 cm benign right renal cyst. Stomach/Bowel: The stomach is fluid-filled and moderately distended. Diffuse distension of small bowel loops which are also fluid-filled to the level of the distal ileum. Previous resection of the terminal ileum. Marked inflammatory changes and bowel wall thickening at the anastomosis causing a high-grade small bowel obstruction. The small bowel loops upstream of dilated to 5.3 cm. The more distal portion of the ileum demonstrates diffuse mucosal thickening. The colon is decompressed. Vascular/Lymphatic: No significant vascular findings are present. No enlarged abdominal or pelvic lymph nodes. Reproductive: Uterus and bilateral adnexa are unremarkable. Other: No abdominal wall hernia or abnormality. No abdominopelvic ascites. Musculoskeletal: Spondylosis at L5-S1. IMPRESSION: 1. High-grade small bowel obstruction secondary to marked inflammatory changes and bowel wall thickening versus volvulus at the anastomosis of the terminal ileum and right colon. 2. Diffuse mucosal thickening of the more distal portion of the ileum. 3. Diffuse intrahepatic biliary ductal dilation. 4. Post cholecystectomy. 5. Spondylosis at L5-S1. Electronically Signed   By: Ted Mcalpine M.D.   On: 09/17/2022 15:06    Catarina Hartshorn, DO  [facility]  If 7PM-7AM, please contact night-coverage [email or web address] Password [email or web address] 09/20/2022, 5:18 PM   LOS: 3 days

## 2022-09-20 NOTE — Progress Notes (Signed)
Patients NG tube to left nare intact, flushed with sterile water as ordered. Patient verbalized complaints of pain during shift PRN given, see MAR. Patient verbalized complaints of nausea during shift given Zofran at 1306 later during shift complained of nausea. Patient unable to get PRN at this time. MD Tat made aware. New order placed. Patient given PRN Compazine at 1640, reported was effective. Patient had three bowel movements during shift. NG tube output 300, brown in color.

## 2022-09-20 NOTE — Progress Notes (Signed)
Subjective: Had a loose BM yesterday afternoon. No BM today. Doesn't think she has passed any gas. Received dilaudid last night and once this morning. Reports her abdominal pain is improving, but was starting to act up, so she went ahead and asked for the pain medications. No real nausea.   NG tube with 150 cc output from 6 am to 10:30 am.   Objective: Vital signs in last 24 hours: Temp:  [97.5 F (36.4 C)-98.4 F (36.9 C)] 97.8 F (36.6 C) (07/16 0416) Pulse Rate:  [89-100] 89 (07/16 0416) Resp:  [18] 18 (07/16 0416) BP: (136-141)/(70-75) 138/72 (07/16 0416) SpO2:  [95 %-97 %] 97 % (07/16 0416) Last BM Date : 09/19/22 General:   Alert and oriented, pleasant, NAD.  Head:  Normocephalic and atraumatic. Abdomen:  Soft, very mild generalized tenderness to palpation. Minimal bowel sounds. No rebound or guarding.  Extremities:  Without edema. Neurologic:  Alert and  oriented x4;  grossly normal neurologically. Psych:  Normal mood and affect.  Intake/Output from previous day: 07/15 0701 - 07/16 0700 In: 1341.3 [P.O.:160; I.V.:1181.3] Out: 950 [Emesis/NG output:950] Intake/Output this shift: No intake/output data recorded.  Lab Results: Recent Labs    09/17/22 1155 09/18/22 0415 09/19/22 0500  WBC 14.1* 10.2 6.7  HGB 12.1 10.8* 10.1*  HCT 37.4 33.8* 31.1*  PLT 520* 493* 443*   BMET Recent Labs    09/18/22 0415 09/19/22 0500 09/20/22 0412  NA 137 138 142  K 3.2* 3.2* 3.8  CL 99 100 104  CO2 28 29 28   GLUCOSE 107* 80 70  BUN 9 12 12   CREATININE 0.60 0.59 0.67  CALCIUM 8.1* 8.1* 8.1*   LFT Recent Labs    09/17/22 1155  PROT 7.8  ALBUMIN 2.8*  AST 19  ALT 20  ALKPHOS 206*  BILITOT 0.4   Assessment: 52 y.o. female with a past medical history of ileocolonic Crohn's disease status post ilio colonic resection in 2002 chronically on weekly Humira, GERD, peptic ulcer disease, bipolar, sleep apnea, who presented to [facility] 7/13 with abdominal pain,  nausea, found to have high-grade small bowel obstruction secondary to marked inflammation changes and bowel wall thickening at the ileocolonic anastomosis on CT.    SBO/Crohn's flare: SBO secondary to crohn's flare, off Humira since April 2024 due to cost ($200). Currently with NG tube in place and on IV solumedrol 60 mg daily starting 7/14. Surgery has seen patient this admission, but no indication for acute surgical intervention at this time. If surgery is needed, would benefit from IBD/colorectal surgeon/multidisciplinary team.   She had a loose BM yesterday evening, but no further BM and hasn't passed gas since. Overall, her abdominal pain is improving though she did receive dilaudid 5 times yesterday and once this morning. We discussed the importance of limiting opioid pain medications as much as possible. We discussed trial of clears, but would prefer to wait until she starts passing some gas or has another BM as she had minimal bowel sounds on exam today. Total of output from NG tube over night, output between 6 and and 10:30 am.     Regarding her maintenance therapy, initially recommend resuming Humira outpatient, but she is requesting to try a different medication due to cost. Previously on Remicade, but had adverse reaction (nausea/vomiting). Reviewed medications covered by insurance. Stelara is the only medication with a $0 copay. We will get the office to work on getting this approved.    Notably, last  colonoscopy 05/10/2021 with evidence of prior surgical resection with chronic surgical changes, stricture unable to be intubated with colonoscope, small ulcer and small nodule which were biopsied. Pathology showed chronic active inflammation thought to be due to NSAIDs.      Plan: Continue IV solumedrol 60 mg daily. Keep NG tube in place for now.  If she starts to pass flatus or have BMs, consider clamping NG tube and trial of clears.  NSAID avoidance.  Given the stricture  noted on prior colonoscopy, consider seeing IBD surgeon in the outpatient setting to discuss possible further resection. Will go ahead and submit for Stelara as maintenance therapy.    LOS: 3 days    09/20/2022, 10:32 AM   Ermalinda Memos, PA-C [facility]

## 2022-09-21 DIAGNOSIS — F319 Bipolar disorder, unspecified: Secondary | ICD-10-CM

## 2022-09-21 DIAGNOSIS — K50018 Crohn's disease of small intestine with other complication: Secondary | ICD-10-CM

## 2022-09-21 DIAGNOSIS — K56609 Unspecified intestinal obstruction, unspecified as to partial versus complete obstruction: Secondary | ICD-10-CM | POA: Diagnosis not present

## 2022-09-21 DIAGNOSIS — K50812 Crohn's disease of both small and large intestine with intestinal obstruction: Secondary | ICD-10-CM | POA: Diagnosis not present

## 2022-09-21 LAB — CBC
HCT: 31.3 % — ABNORMAL LOW (ref 36.0–46.0)
Hemoglobin: 9.9 g/dL — ABNORMAL LOW (ref 12.0–15.0)
MCH: 26.9 pg (ref 26.0–34.0)
MCHC: 31.6 g/dL (ref 30.0–36.0)
MCV: 85.1 fL (ref 80.0–100.0)
Platelets: 448 10*3/uL — ABNORMAL HIGH (ref 150–400)
RBC: 3.68 MIL/uL — ABNORMAL LOW (ref 3.87–5.11)
RDW: 16.4 % — ABNORMAL HIGH (ref 11.5–15.5)
WBC: 9.6 10*3/uL (ref 4.0–10.5)
nRBC: 0.7 % — ABNORMAL HIGH (ref 0.0–0.2)

## 2022-09-21 LAB — MAGNESIUM: Magnesium: 1.6 mg/dL — ABNORMAL LOW (ref 1.7–2.4)

## 2022-09-21 LAB — BASIC METABOLIC PANEL
Anion gap: 7 (ref 5–15)
BUN: 8 mg/dL (ref 6–20)
CO2: 26 mmol/L (ref 22–32)
Calcium: 7.8 mg/dL — ABNORMAL LOW (ref 8.9–10.3)
Chloride: 107 mmol/L (ref 98–111)
Creatinine, Ser: 0.59 mg/dL (ref 0.44–1.00)
GFR, Estimated: >60 mL/min (ref >60)
Glucose, Bld: 65 mg/dL — ABNORMAL LOW (ref 70–99)
Potassium: 3.9 mmol/L (ref 3.5–5.1)
Sodium: 140 mmol/L (ref 135–145)

## 2022-09-21 MED ORDER — MAGNESIUM SULFATE IN D5W 1-5 GM/100ML-% IV SOLN
1.0000 g | Freq: Once | INTRAVENOUS | Status: AC
  Administered 2022-09-21: 1 g via INTRAVENOUS
  Filled 2022-09-21: qty 100

## 2022-09-21 MED ORDER — METHYLPREDNISOLONE SODIUM SUCC 125 MG IJ SOLR
60.0000 mg | Freq: Every day | INTRAMUSCULAR | Status: DC
  Administered 2022-09-22 – 2022-09-28 (×7): 60 mg via INTRAVENOUS
  Filled 2022-09-21 (×7): qty 2

## 2022-09-21 MED ORDER — ORAL CARE MOUTH RINSE: 15.0000 mL | OROMUCOSAL | Status: DC | PRN

## 2022-09-21 NOTE — Progress Notes (Addendum)
Attending note: 52 year old lady with at least a 20-year history of ileocolonic Crohn's disease admitted to the hospital with a small bowel obstruction.  Was getting along well with Humira until she lost financial support.  She has been off of Biologic therapy for a couple of months.  CT scan shows high-grade obstruction with significant inflammatory changes around the neoterminal ileum. She consumed a fair amount of clear liquids for breakfast and lunch today while her NG tube was clamped.  She developed abdominal distention and nausea this afternoon.  NG placed back to suction with marked improvement.  She did have a small bowel movement today. This lady does have a component of fibro-stenosing neoterminal ileal disease as documented on last year's colonoscopy.  Hopefully, the acute inflammatory component can be squelch with corticosteroid therapy followed by the initiation of Stelara (which is in the works). For the remainder of today, leave NG tube to low wall suction. Continue Solu-Medrol. Attempt slowly advancing diet starting with clear liquids tomorrow morning Will need bridging with budesonide as Marcy Panning is initiated.   Subjective: Patient feeling better this am. No BMs today but 2 small BMs yesterday evening. NG tube is clamped. She tolerated a shasta soda, chicken broth and jello this morning but notes that grape juice made her have some abdominal pain. Some mild nausea yesterday afternoon but notes this resolved after anti emetics. She is passing some flatus this morning as well. She reports she had a good night and was able to get some rest.   Objective: Vital signs in last 24 hours: Temp:  [97.9 F (36.6 C)-98.7 F (37.1 C)] 97.9 F (36.6 C) (07/17 0453) Pulse Rate:  [96-100] 96 (07/17 0453) Resp:  [16-20] 20 (07/17 0453) BP: (146-151)/(77-82) 146/82 (07/17 0453) SpO2:  [97 %-98 %] 97 % (07/17 0453) Last BM Date : 09/20/22 General:   Alert and oriented, pleasant Head:   Normocephalic and atraumatic. Eyes:  No icterus, sclera clear. Conjuctiva pink.  Mouth:  Without lesions, mucosa pink and moist.  Heart:  S1, S2 present, no murmurs noted.  Lungs: Clear to auscultation bilaterally, without wheezing, rales, or rhonchi.  Abdomen:  Bowel sounds present, soft, very mild distention. Mild TTP around umbilicus. No HSM or hernias noted. No rebound or guarding. No masses appreciated  Msk:  Symmetrical without gross deformities. Normal posture. Extremities:  Without clubbing or edema. Neurologic:  Alert and  oriented x4;  grossly normal neurologically. Skin:  Warm and dry, intact without significant lesions.  Psych:  Alert and cooperative. Normal mood and affect.  Intake/Output from previous day: 07/16 0701 - 07/17 0700 In: 1369.9 [I.V.:1369.9] Out: 300 [Emesis/NG output:300] Intake/Output this shift: No intake/output data recorded.  Lab Results: Recent Labs    09/19/22 0500 09/21/22 0409  WBC 6.7 9.6  HGB 10.1* 9.9*  HCT 31.1* 31.3*  PLT 443* 448*   BMET Recent Labs    09/19/22 0500 09/20/22 0412 09/21/22 0409  NA 138 142 140  K 3.2* 3.8 3.9  CL 100 104 107  CO2 29 28 26   GLUCOSE 80 70 65*  BUN 12 12 8   CREATININE 0.59 0.67 0.59  CALCIUM 8.1* 8.1* 7.8*    Assessment: [name] is a 52 y.o. female with history of ileocolonic Crohn's disease s/p ilio colonic resection in 2002 chronically on weekly Humira, GERD, PUD, bipolar, sleep apnea, who presented to [facility] 7/13 with abdominal pain, nausea, found to have high-grade small bowel obstruction secondary to marked inflammation changes and bowel wall  thickening at the ileocolonic anastomosis on CT.   SBO/Crohn's flare: off humira since April 2024 due to cost. NG tube in place and on IV solumedrol 60mg  daily since 7/14. Surgery did not feel need for surgical intervention at this time, though if she does require surgery would recommend IBD/Colorectal surgeon.  Initially our team had  recommended resuming Humira as an outpatient, however patient wanted to try different medication due to cost.  Did not tolerate Remicade in the past, Stelara appeared to be the only medication with a $0 co-pay.  Our office is working to get this approved for her.  Last colonoscopy March 2023 with evidence of prior surgical resection with chronic surgical changes, stricture unable to be intubated with colonoscope, small ulcer and small nodule which were biopsied with path showing chronic active inflammation thought secondary to NSAIDs.  Clear liquid trial started this morning. Patient feeling much better today. NG tube clamped, tolerating liquid diet trial so far. No nausea. Passing flatus and 2 small BMs yesterday evening. Could remove NG tube if she continues to do well throughout the day and tolerates liquid diet at lunch.    Plan: Continue IV solumedrol 60mg  daily Clear liquid diet, can slowly advance as tolerated Consider removing NG if liquid lunch well tolerated NSAID avoidance Consider outpatient evaluation by IBD surgeon Stelara for maintenance therapy-process has been initiated  Continue with supportive measures   LOS: 4 days    09/21/2022, 9:00 AM   Chelsea L. Jeanmarie Hubert, MSN, APRN, AGNP-C Adult-Gerontology Nurse Practitioner [facility] at [facility]

## 2022-09-21 NOTE — Progress Notes (Signed)
  Subjective: Patient continues to improve.  She has had multiple bowel movements and is passing flatus.  She denies any abdominal pain.  She has less abdominal distention.  Her NG tube is currently clamped.  Objective: Vital signs in last 24 hours: Temp:  [97.9 F (36.6 C)-98.7 F (37.1 C)] 97.9 F (36.6 C) (07/17 0453) Pulse Rate:  [96-100] 96 (07/17 0453) Resp:  [16-20] 20 (07/17 0453) BP: (146-151)/(77-82) 146/82 (07/17 0453) SpO2:  [97 %-98 %] 97 % (07/17 0453) Last BM Date : 09/20/22  Intake/Output from previous day: 07/16 0701 - 07/17 0700 In: 1369.9 [I.V.:1369.9] Out: 300 [Emesis/NG output:300] Intake/Output this shift: No intake/output data recorded.  General appearance: alert, cooperative, and no distress GI: soft, non-tender; bowel sounds normal; no masses,  no organomegaly  Lab Results:  Recent Labs    09/19/22 0500 09/21/22 0409  WBC 6.7 9.6  HGB 10.1* 9.9*  HCT 31.1* 31.3*  PLT 443* 448*   BMET Recent Labs    09/20/22 0412 09/21/22 0409  NA 142 140  K 3.8 3.9  CL 104 107  CO2 28 26  GLUCOSE 70 65*  BUN 12 8  CREATININE 0.67 0.59  CALCIUM 8.1* 7.8*   PT/INR No results for input(s): "LABPROT", "INR" in the last 72 hours.  Studies/Results: No results found.  Anti-infectives: Anti-infectives (From admission, onward)    None       Assessment/Plan: Impression: Partial small bowel obstruction secondary to acute Crohn's exacerbation, resolving.  No need for surgical intervention.  Agree with removing NG tube and advancing diet as tolerated.  Will follow peripherally with you.  LOS: 4 days    Franky Macho 09/21/2022

## 2022-09-21 NOTE — Plan of Care (Signed)

## 2022-09-21 NOTE — Plan of Care (Signed)
  Problem: Education: Goal: Knowledge of General Education information will improve Description Including pain rating scale, medication(s)/side effects and non-pharmacologic comfort measures Outcome: Progressing   

## 2022-09-21 NOTE — Progress Notes (Signed)
PROGRESS NOTE  [name] BJY:[id number] DOB: 28-Aug-1970 DOA: 09/17/2022 PCP: Kerri Perches, MD  Brief History:  52 year old female with a history of Crohn's disease on chronic Humira status post ileocolonic resection, GERD, bipolar disorder, OSA presenting with abdominal pain that began on 09/16/2022.  She had associated nausea and vomiting.  She denied fevers, chills, chest pain, shortness of breath.  She states that her abdominal pain was similar to her previous Crohn's exacerbations.  Because of worsening pain and vomiting, she presented for further evaluation and treatment. In the ED, the patient was afebrile and hemodynamically stable with oxygen saturation 90% room air.  WBC 14.1, hemoglobin 12.1, platelets 520,000.  Sodium 137, potassium 3.2, bicarbonate 28, serum creatinine 0.60.  LFTs were unremarkable.  CT of the abdomen and pelvis showed high-grade SBO secondary to marked inflammatory changes with bowel wall thickening at the level of the terminal ileum and right colon anastomosis.  There was diffuse mucosal thickening of the distal ileum. The patient was given a dose of IV Solu-Medrol.  GI and general surgery consulted to assist with management.  Upon further questioning, it was noted the patient had not received the Humira dose since April 2024.  05/10/2021 colonoscopy--noted the opening to the neoterminal ileum was very narrow; unable to pass/intubate with pediatric colonoscope   Assessment/Plan: Crohn's disease exacerbation/Small Bowel Obstruction -Case discussed with gastroenterology and general surgery.  Appreciate assistance and collaboration. -The patient's current obstruction secondary to Crohn's exacerbation with inflammatory changes worsening her established stricture at her anastomosis -Continue to maintain adequate hydration and supportive care. -judious opioids -Continue to demonstrate significant improvement and at time of examination completion  multiple bowel movements and expelling any further vomiting -NG tube has been clamped and so far tolerating clear liquid diet; planning to remove NG tube and further advance diet as tolerated -Continue steroids -Hopefully home tomorrow.   Hypokalemia/Hypomagnesemia -Magnesium within normal limit -Continue to follow electrolytes and further replete as needed.   Bipolar disorder -Restart Effexor and vistaril once able to tolerate p.o. -Hopefully later today after NG tube       Family Communication: No family at bedside on today's evaluation.   Consultants:  GI, general surgery   Code Status:  FULL    DVT Prophylaxis:   Gay Lovenox     Procedures: As Listed in Progress Note Above   Antibiotics: None   Subjective: No fever, no chest pain, reports no vomiting and just mild intermittent nausea.  NG tube in place but patient expressed no significant abdominal discomfort, passing gas and achieving multiple bowel movements overnight.  Diet has been advanced to clear liquids.  Objective: Vitals:   09/20/22 0416 09/20/22 1356 09/21/22 0453 09/21/22 1027  BP: 138/72 (!) 151/77 (!) 146/82 (!) 157/83  Pulse: 89 100 96 90  Resp: 18 16 20 19   Temp: 97.8 F (36.6 C) 98.7 F (37.1 C) 97.9 F (36.6 C) 98.7 F (37.1 C)  TempSrc:  Oral Oral Oral  SpO2: 97% 98% 97% 98%  Weight:      Height:        Intake/Output Summary (Last 24 hours) at 09/21/2022 1044 Last data filed at 09/21/2022 0500 Gross per 24 hour  Intake 1369.91 ml  Output 300 ml  Net 1069.91 ml   Weight change:   Exam: General exam: Alert, awake, oriented x 3; reporting no further vomiting and just having intermittent abdominal pain.  No chest pain, no shortness of  breath. Respiratory system: Clear to auscultation. Respiratory effort normal.  Good saturation on room air. Cardiovascular system:RRR. No rubs or gallops; no JVD. Gastrointestinal system: Abdomen is nondistended, soft and mildly tenderness diffusely on  palpation.  Positive bowel sounds. Central nervous system: Alert and oriented. No focal neurological deficits. Extremities: No cyanosis or clubbing. Skin: No petechiae. Psychiatry: Judgement and insight appear normal. Mood & affect appropriate.    Data Reviewed: I have personally reviewed following labs and imaging studies  Basic Metabolic Panel: Recent Labs  Lab 09/17/22 1145 09/17/22 1155 09/18/22 0415 09/19/22 0500 09/20/22 0412 09/21/22 0409  NA  --  139 137 138 142 140  K  --  3.0* 3.2* 3.2* 3.8 3.9  CL  --  97* 99 100 104 107  CO2  --  29 28 29 28 26   GLUCOSE  --  101* 107* 80 70 65*  BUN  --  8 9 12 12 8   CREATININE  --  0.63 0.60 0.59 0.67 0.59  CALCIUM  --  8.8* 8.1* 8.1* 8.1* 7.8*  MG 1.9  --   --  1.6* 2.0 1.6*   Liver Function Tests: Recent Labs  Lab 09/17/22 1155  AST 19  ALT 20  ALKPHOS 206*  BILITOT 0.4  PROT 7.8  ALBUMIN 2.8*   Recent Labs  Lab 09/17/22 1155  LIPASE 25   CBC: Recent Labs  Lab 09/17/22 1155 09/18/22 0415 09/19/22 0500 09/21/22 0409  WBC 14.1* 10.2 6.7 9.6  HGB 12.1 10.8* 10.1* 9.9*  HCT 37.4 33.8* 31.1* 31.3*  MCV 82.6 84.5 85.0 85.1  PLT 520* 493* 443* 448*   Urine analysis:    Component Value Date/Time   COLORURINE YELLOW 09/17/2022 1200   APPEARANCEUR CLEAR 09/17/2022 1200   LABSPEC 1.023 09/17/2022 1200   PHURINE 5.0 09/17/2022 1200   GLUCOSEU NEGATIVE 09/17/2022 1200   HGBUR NEGATIVE 09/17/2022 1200   HGBUR negative 08/10/2009 1013   BILIRUBINUR NEGATIVE 09/17/2022 1200   KETONESUR NEGATIVE 09/17/2022 1200   PROTEINUR 30 (A) 09/17/2022 1200   UROBILINOGEN 0.2 10/17/2010 1249   NITRITE NEGATIVE 09/17/2022 1200   LEUKOCYTESUR NEGATIVE 09/17/2022 1200   Scheduled Meds:  enoxaparin (LOVENOX) injection  40 mg Subcutaneous Q24H   methylPREDNISolone (SOLU-MEDROL) injection  60 mg Intravenous Daily   Continuous Infusions:  0.9 % NaCl with KCl 40 mEq / L 100 mL/hr at 09/21/22 9604    Procedures/Studies: DG  Abd 1 View  Result Date: 09/18/2022 CLINICAL DATA:  Check gastric catheter placement EXAM: ABDOMEN - 1 VIEW COMPARISON:  None Available. FINDINGS: Gastric catheter extends into the stomach. The proximal side port now lies within the gastric lumen. Lungs are hypoinflated but clear. No bony abnormality is noted. IMPRESSION: Gastric catheter in satisfactory position. Electronically Signed   By: Alcide Clever M.D.   On: 09/18/2022 00:50   DG Abd 1 View  Result Date: 09/17/2022 CLINICAL DATA:  Check gastric catheter placement EXAM: ABDOMEN - 1 VIEW COMPARISON:  None Available. FINDINGS: Gastric catheter is noted with the tip in the stomach. Proximal side port lies at the gastroesophageal junction. This should be advanced deeper into the stomach. Lungs are clear but hypoinflated. No free air is noted in the abdomen. IMPRESSION: Gastric catheter as described. This should be advanced deeper into the stomach. Electronically Signed   By: Alcide Clever M.D.   On: 09/17/2022 21:52   DG Abd Portable 1V  Result Date: 09/17/2022 CLINICAL DATA:  NG tube placement. EXAM: PORTABLE ABDOMEN - 1  VIEW COMPARISON:  October 28, 2017 FINDINGS: Enteric catheter terminates at the expected location of gastric body, side hole at the level of the GE junction. Partially visualized distension of small bowels. Elevation of the left hemidiaphragm. IMPRESSION: Enteric catheter terminates at the expected location of gastric body, side hole at the level of the GE junction. Advancement of the enteric catheter is recommended. Electronically Signed   By: Ted Mcalpine M.D.   On: 09/17/2022 19:06   CT ABDOMEN PELVIS W CONTRAST  Result Date: 09/17/2022 CLINICAL DATA:  Abdominal pain.  History of Crohn's disease. EXAM: CT ABDOMEN AND PELVIS WITH CONTRAST TECHNIQUE: Multidetector CT imaging of the abdomen and pelvis was performed using the standard protocol following bolus administration of intravenous contrast. RADIATION DOSE REDUCTION: This  exam was performed according to the departmental dose-optimization program which includes automated exposure control, adjustment of the mA and/or kV according to patient size and/or use of iterative reconstruction technique. CONTRAST:  OMNIPAQUE IOHEXOL 300 MG/ML  SOLN COMPARISON:  October 28, 2017 FINDINGS: Lower chest: No acute abnormality. Hepatobiliary: Diffuse intrahepatic biliary ductal dilation. Post cholecystectomy. Pancreas: Unremarkable. No pancreatic ductal dilatation or surrounding inflammatory changes. Spleen: Normal in size without focal abnormality. Adrenals/Urinary Tract: Adrenal glands are unremarkable. Kidneys are normal, without renal calculi, focal lesion, or hydronephrosis. Bladder is unremarkable. 1.5 cm benign right renal cyst. Stomach/Bowel: The stomach is fluid-filled and moderately distended. Diffuse distension of small bowel loops which are also fluid-filled to the level of the distal ileum. Previous resection of the terminal ileum. Marked inflammatory changes and bowel wall thickening at the anastomosis causing a high-grade small bowel obstruction. The small bowel loops upstream of dilated to 5.3 cm. The more distal portion of the ileum demonstrates diffuse mucosal thickening. The colon is decompressed. Vascular/Lymphatic: No significant vascular findings are present. No enlarged abdominal or pelvic lymph nodes. Reproductive: Uterus and bilateral adnexa are unremarkable. Other: No abdominal wall hernia or abnormality. No abdominopelvic ascites. Musculoskeletal: Spondylosis at L5-S1. IMPRESSION: 1. High-grade small bowel obstruction secondary to marked inflammatory changes and bowel wall thickening versus volvulus at the anastomosis of the terminal ileum and right colon. 2. Diffuse mucosal thickening of the more distal portion of the ileum. 3. Diffuse intrahepatic biliary ductal dilation. 4. Post cholecystectomy. 5. Spondylosis at L5-S1. Electronically Signed   By: Ted Mcalpine  M.D.   On: 09/17/2022 15:06    Vassie Loll, MD  [facility]  If 7PM-7AM, please contact night-coverage [email or web address] Password [email or web address] 09/21/2022, 10:44 AM   LOS: 4 days

## 2022-09-22 ENCOUNTER — Telehealth: Payer: Self-pay | Admitting: Gastroenterology

## 2022-09-22 DIAGNOSIS — K56609 Unspecified intestinal obstruction, unspecified as to partial versus complete obstruction: Secondary | ICD-10-CM | POA: Diagnosis not present

## 2022-09-22 DIAGNOSIS — K50812 Crohn's disease of both small and large intestine with intestinal obstruction: Secondary | ICD-10-CM | POA: Diagnosis not present

## 2022-09-22 DIAGNOSIS — F319 Bipolar disorder, unspecified: Secondary | ICD-10-CM | POA: Diagnosis not present

## 2022-09-22 MED ORDER — ONDANSETRON HCL 4 MG/2ML IJ SOLN
4.0000 mg | Freq: Four times a day (QID) | INTRAMUSCULAR | Status: AC
  Administered 2022-09-22 – 2022-09-23 (×4): 4 mg via INTRAVENOUS
  Filled 2022-09-22 (×4): qty 2

## 2022-09-22 NOTE — Telephone Encounter (Signed)
Please start approval process for Stelara for Dx: Crohn's. Patient is currently inpatient with flare/small bowel obstruction. We need to expedite this.

## 2022-09-22 NOTE — Plan of Care (Signed)

## 2022-09-22 NOTE — Progress Notes (Signed)
Gastroenterology Progress Note   Referring Provider: No ref. provider found Primary Care Physician:  Michele Perches, MD Primary Gastroenterologist:  Roetta Sessions, MD   Patient ID: [name]; [id number]; Nov 02, 1970   Subjective:    Still having a lot of nausea. Has a loose stool this morning. Some abdominal soreness throughout. NG tube to low suction. Up in the chair for the first time today. With nausea right now and spitting up some around the NG, she is not quite ready for liquids.   Objective:   Vital signs in last 24 hours: Temp:  [97.6 F (36.4 C)-98.7 F (37.1 C)] 97.6 F (36.4 C) (07/18 0337) Pulse Rate:  [76-98] 76 (07/18 0337) Resp:  [16-20] 16 (07/18 0337) BP: (136-163)/(73-92) 147/79 (07/18 0337) SpO2:  [95 %-98 %] 97 % (07/18 0337) Last BM Date : 09/21/22 General:   Alert,  Well-developed, well-nourished, pleasant and cooperative in NAD Head:  Normocephalic and atraumatic. Eyes:  Sclera clear, no icterus.  . Abdomen:  Soft, nondistended. Normal bowel sounds, without guarding, and without rebound. Generalized mild tenderness. Extremities:  Without clubbing, deformity or edema. Neurologic:  Alert and  oriented x4;  grossly normal neurologically. Skin:  Intact without significant lesions or rashes. Psych:  Alert and cooperative. Normal mood and affect.  Intake/Output from previous day: 07/17 0701 - 07/18 0700 In: 4484.7 [P.O.:220; I.V.:4234.7; NG/GT:30] Out: 300 [Emesis/NG output:300] Intake/Output this shift: Total I/O In: -  Out: 302 [Urine:1; Emesis/NG output:300; Stool:1]  Lab Results: CBC Recent Labs    09/21/22 0409  WBC 9.6  HGB 9.9*  HCT 31.3*  MCV 85.1  PLT 448*   BMET Recent Labs    09/20/22 0412 09/21/22 0409  NA 142 140  K 3.8 3.9  CL 104 107  CO2 28 26  GLUCOSE 70 65*  BUN 12 8  CREATININE 0.67 0.59  CALCIUM 8.1* 7.8*   LFTs No results for input(s): "BILITOT", "BILIDIR", "IBILI", "ALKPHOS", "AST", "ALT", "PROT",  "ALBUMIN" in the last 72 hours. No results for input(s): "LIPASE" in the last 72 hours. PT/INR No results for input(s): "LABPROT", "INR" in the last 72 hours.       Imaging Studies: DG Abd 1 View  Result Date: 09/18/2022 CLINICAL DATA:  Check gastric catheter placement EXAM: ABDOMEN - 1 VIEW COMPARISON:  None Available. FINDINGS: Gastric catheter extends into the stomach. The proximal side port now lies within the gastric lumen. Lungs are hypoinflated but clear. No bony abnormality is noted. IMPRESSION: Gastric catheter in satisfactory position. Electronically Signed   By: Alcide Clever M.D.   On: 09/18/2022 00:50   DG Abd 1 View  Result Date: 09/17/2022 CLINICAL DATA:  Check gastric catheter placement EXAM: ABDOMEN - 1 VIEW COMPARISON:  None Available. FINDINGS: Gastric catheter is noted with the tip in the stomach. Proximal side port lies at the gastroesophageal junction. This should be advanced deeper into the stomach. Lungs are clear but hypoinflated. No free air is noted in the abdomen. IMPRESSION: Gastric catheter as described. This should be advanced deeper into the stomach. Electronically Signed   By: Alcide Clever M.D.   On: 09/17/2022 21:52   DG Abd Portable 1V  Result Date: 09/17/2022 CLINICAL DATA:  NG tube placement. EXAM: PORTABLE ABDOMEN - 1 VIEW COMPARISON:  October 28, 2017 FINDINGS: Enteric catheter terminates at the expected location of gastric body, side hole at the level of the GE junction. Partially visualized distension of small bowels. Elevation of the left hemidiaphragm. IMPRESSION: Enteric  catheter terminates at the expected location of gastric body, side hole at the level of the GE junction. Advancement of the enteric catheter is recommended. Electronically Signed   By: Ted Mcalpine M.D.   On: 09/17/2022 19:06   CT ABDOMEN PELVIS W CONTRAST  Result Date: 09/17/2022 CLINICAL DATA:  Abdominal pain.  History of Crohn's disease. EXAM: CT ABDOMEN AND PELVIS WITH  CONTRAST TECHNIQUE: Multidetector CT imaging of the abdomen and pelvis was performed using the standard protocol following bolus administration of intravenous contrast. RADIATION DOSE REDUCTION: This exam was performed according to the departmental dose-optimization program which includes automated exposure control, adjustment of the mA and/or kV according to patient size and/or use of iterative reconstruction technique. CONTRAST:  OMNIPAQUE IOHEXOL 300 MG/ML  SOLN COMPARISON:  October 28, 2017 FINDINGS: Lower chest: No acute abnormality. Hepatobiliary: Diffuse intrahepatic biliary ductal dilation. Post cholecystectomy. Pancreas: Unremarkable. No pancreatic ductal dilatation or surrounding inflammatory changes. Spleen: Normal in size without focal abnormality. Adrenals/Urinary Tract: Adrenal glands are unremarkable. Kidneys are normal, without renal calculi, focal lesion, or hydronephrosis. Bladder is unremarkable. 1.5 cm benign right renal cyst. Stomach/Bowel: The stomach is fluid-filled and moderately distended. Diffuse distension of small bowel loops which are also fluid-filled to the level of the distal ileum. Previous resection of the terminal ileum. Marked inflammatory changes and bowel wall thickening at the anastomosis causing a high-grade small bowel obstruction. The small bowel loops upstream of dilated to 5.3 cm. The more distal portion of the ileum demonstrates diffuse mucosal thickening. The colon is decompressed. Vascular/Lymphatic: No significant vascular findings are present. No enlarged abdominal or pelvic lymph nodes. Reproductive: Uterus and bilateral adnexa are unremarkable. Other: No abdominal wall hernia or abnormality. No abdominopelvic ascites. Musculoskeletal: Spondylosis at L5-S1. IMPRESSION: 1. High-grade small bowel obstruction secondary to marked inflammatory changes and bowel wall thickening versus volvulus at the anastomosis of the terminal ileum and right colon. 2. Diffuse  mucosal thickening of the more distal portion of the ileum. 3. Diffuse intrahepatic biliary ductal dilation. 4. Post cholecystectomy. 5. Spondylosis at L5-S1. Electronically Signed   By: Ted Mcalpine M.D.   On: 09/17/2022 15:06  [2 weeks]  Assessment:   [name] is a 52 y.o. female with history of ileocolonic Crohn's disease s/p ilio colonic resection in 2002 chronically on weekly Humira, GERD, PUD, bipolar, sleep apnea, who presented to [facility] 7/13 with abdominal pain, nausea, found to have high-grade small bowel obstruction secondary to marked inflammation changes and bowel wall thickening at the ileocolonic anastomosis on CT.    SBO/Crohn's flare: off humira since April 2024 due to cost. NG tube in place and on IV solumedrol 60mg  daily since 7/14. Surgery did not feel need for surgical intervention at this time, though if she does require surgery would recommend IBD/Colorectal surgeon.   Initially our team had recommended resuming Humira as an outpatient, however patient wanted to try different medication due to cost.  Did not tolerate Remicade (sporadic vomiting) in the past, Stelara appeared to be the only medication with a $0 co-pay.  Our office is working to get this approved for her.   Last colonoscopy March 2023 with evidence of prior surgical resection with chronic surgical changes, stricture unable to be intubated with colonoscope, small ulcer and small nodule which were biopsied with path showing chronic active inflammation thought secondary to NSAIDs.   Clear liquid trial started yesterday. Tolerated breakfast but after lunch, developed distention and nausea therefore NG tube was placed on suction and  she is NPO again. This morning she does not feel ready to start clears. Has significant nausea. She believes she would have been ok with shasta and chicken broth but felt sick after beef broth and icecream pop.      Plan:   Continue IV solumedrol 60mg  daily. Avoid  NSAIDs. Stelara for maintenance, working on approval.  Will need to be bridged with budesonide 9mg  daily at discharge. Reassess later today for possible sips of clear liquids.  Will schedule her zofran for 24 hours.   LOS: 5 days   Leanna Battles. Dixon Boos [facility] [phone] 7/18/20249:11 AM

## 2022-09-22 NOTE — Progress Notes (Signed)
PROGRESS NOTE  [name] ZOX:[id number] DOB: January 04, 1971 DOA: 09/17/2022 PCP: Kerri Perches, MD  Brief History:  52 year old female with a history of Crohn's disease on chronic Humira status post ileocolonic resection, GERD, bipolar disorder, OSA presenting with abdominal pain that began on 09/16/2022.  She had associated nausea and vomiting.  She denied fevers, chills, chest pain, shortness of breath.  She states that her abdominal pain was similar to her previous Crohn's exacerbations.  Because of worsening pain and vomiting, she presented for further evaluation and treatment. In the ED, the patient was afebrile and hemodynamically stable with oxygen saturation 90% room air.  WBC 14.1, hemoglobin 12.1, platelets 520,000.  Sodium 137, potassium 3.2, bicarbonate 28, serum creatinine 0.60.  LFTs were unremarkable.  CT of the abdomen and pelvis showed high-grade SBO secondary to marked inflammatory changes with bowel wall thickening at the level of the terminal ileum and right colon anastomosis.  There was diffuse mucosal thickening of the distal ileum. The patient was given a dose of IV Solu-Medrol.  GI and general surgery consulted to assist with management.  Upon further questioning, it was noted the patient had not received the Humira dose since April 2024.  05/10/2021 colonoscopy--noted the opening to the neoterminal ileum was very narrow; unable to pass/intubate with pediatric colonoscope   Assessment/Plan: Crohn's disease exacerbation/Small Bowel Obstruction -Case discussed with gastroenterology and general surgery.  Appreciate assistance and collaboration. -The patient's current obstruction secondary to Crohn's exacerbation with inflammatory changes worsening her established stricture at her anastomosis -Continue to maintain adequate hydration and supportive care. -judious opioids -Vague abdominal discomfort; reports passing gas and moving bowels. -Patient failed diet  advancements on 09/21/2022; continue NG tube with intermittent suctioning and hopefully later today a small amount of clear liquid diet with second attempt to clamp NG tube. -Continue steroids (IV Solu-Medrol).  GI service planning for biological agent for maintenance and bridging therapy with budenoside -Planning to be discharged home once able to tolerate diet.   Hypokalemia/Hypomagnesemia -Magnesium within normal limit -Continue to follow electrolytes and further replete as needed. -Most recent level 3.9.   Bipolar disorder -Restart Effexor and vistaril once able to tolerate p.o. -Hopefully later today after NG tube       Family Communication: No family at bedside on today's evaluation.   Consultants:  GI, general surgery   Code Status:  FULL    DVT Prophylaxis:   Arnold Lovenox     Procedures: As Listed in Progress Note Above   Antibiotics: None   Subjective: No fever, no chest pain, no vomiting.  Reports ongoing nausea.  Objective: Vitals:   09/21/22 1744 09/21/22 1958 09/21/22 2358 09/22/22 0337  BP: 136/73 (!) 163/84 (!) 159/92 (!) 147/79  Pulse: 80 77 98 76  Resp: 20 20  16   Temp: 97.8 F (36.6 C) 97.7 F (36.5 C)  97.6 F (36.4 C)  TempSrc:  Oral    SpO2: 97% 95%  97%  Weight:      Height:        Intake/Output Summary (Last 24 hours) at 09/22/2022 1708 Last data filed at 09/22/2022 1600 Gross per 24 hour  Intake 2744.68 ml  Output 602 ml  Net 2142.68 ml   Weight change:   Exam: General exam: Alert, awake, oriented x 3; afebrile, no chest pain, reports no further vomiting but is still having nausea.  She fail diet advancement on 09/21/2022 and ended up requiring to be  placed back on intermittent NG tube suctioning. Respiratory system: Good air movement bilaterally; no using accessory muscle.  Good saturation on room air. Cardiovascular system: Rate controlled, no rubs or gallops. Gastrointestinal system: Abdomen is soft, no guarding; reporting diffuse  vague discomfort.  Positive bowel sounds. Central nervous system:  No focal neurological deficits. Extremities: No cyanosis or clubbing. Skin: No petechiae. Psychiatry: Judgement and insight appear normal. Mood & affect appropriate.   Data Reviewed: I have personally reviewed following labs and imaging studies  Basic Metabolic Panel: Recent Labs  Lab 09/17/22 1145 09/17/22 1155 09/18/22 0415 09/19/22 0500 09/20/22 0412 09/21/22 0409  NA  --  139 137 138 142 140  K  --  3.0* 3.2* 3.2* 3.8 3.9  CL  --  97* 99 100 104 107  CO2  --  29 28 29 28 26   GLUCOSE  --  101* 107* 80 70 65*  BUN  --  8 9 12 12 8   CREATININE  --  0.63 0.60 0.59 0.67 0.59  CALCIUM  --  8.8* 8.1* 8.1* 8.1* 7.8*  MG 1.9  --   --  1.6* 2.0 1.6*   Liver Function Tests: Recent Labs  Lab 09/17/22 1155  AST 19  ALT 20  ALKPHOS 206*  BILITOT 0.4  PROT 7.8  ALBUMIN 2.8*   Recent Labs  Lab 09/17/22 1155  LIPASE 25   CBC: Recent Labs  Lab 09/17/22 1155 09/18/22 0415 09/19/22 0500 09/21/22 0409  WBC 14.1* 10.2 6.7 9.6  HGB 12.1 10.8* 10.1* 9.9*  HCT 37.4 33.8* 31.1* 31.3*  MCV 82.6 84.5 85.0 85.1  PLT 520* 493* 443* 448*   Urine analysis:    Component Value Date/Time   COLORURINE YELLOW 09/17/2022 1200   APPEARANCEUR CLEAR 09/17/2022 1200   LABSPEC 1.023 09/17/2022 1200   PHURINE 5.0 09/17/2022 1200   GLUCOSEU NEGATIVE 09/17/2022 1200   HGBUR NEGATIVE 09/17/2022 1200   HGBUR negative 08/10/2009 1013   BILIRUBINUR NEGATIVE 09/17/2022 1200   KETONESUR NEGATIVE 09/17/2022 1200   PROTEINUR 30 (A) 09/17/2022 1200   UROBILINOGEN 0.2 10/17/2010 1249   NITRITE NEGATIVE 09/17/2022 1200   LEUKOCYTESUR NEGATIVE 09/17/2022 1200   Scheduled Meds:  enoxaparin (LOVENOX) injection  40 mg Subcutaneous Q24H   methylPREDNISolone (SOLU-MEDROL) injection  60 mg Intravenous Daily   ondansetron (ZOFRAN) IV  4 mg Intravenous Q6H   Continuous Infusions:  0.9 % NaCl with KCl 40 mEq / L 100 mL/hr at 09/22/22  1643    Procedures/Studies: DG Abd 1 View  Result Date: 09/18/2022 CLINICAL DATA:  Check gastric catheter placement EXAM: ABDOMEN - 1 VIEW COMPARISON:  None Available. FINDINGS: Gastric catheter extends into the stomach. The proximal side port now lies within the gastric lumen. Lungs are hypoinflated but clear. No bony abnormality is noted. IMPRESSION: Gastric catheter in satisfactory position. Electronically Signed   By: Alcide Clever M.D.   On: 09/18/2022 00:50   DG Abd 1 View  Result Date: 09/17/2022 CLINICAL DATA:  Check gastric catheter placement EXAM: ABDOMEN - 1 VIEW COMPARISON:  None Available. FINDINGS: Gastric catheter is noted with the tip in the stomach. Proximal side port lies at the gastroesophageal junction. This should be advanced deeper into the stomach. Lungs are clear but hypoinflated. No free air is noted in the abdomen. IMPRESSION: Gastric catheter as described. This should be advanced deeper into the stomach. Electronically Signed   By: Alcide Clever M.D.   On: 09/17/2022 21:52   DG Abd Portable 1V  Result Date: 09/17/2022 CLINICAL DATA:  NG tube placement. EXAM: PORTABLE ABDOMEN - 1 VIEW COMPARISON:  October 28, 2017 FINDINGS: Enteric catheter terminates at the expected location of gastric body, side hole at the level of the GE junction. Partially visualized distension of small bowels. Elevation of the left hemidiaphragm. IMPRESSION: Enteric catheter terminates at the expected location of gastric body, side hole at the level of the GE junction. Advancement of the enteric catheter is recommended. Electronically Signed   By: Ted Mcalpine M.D.   On: 09/17/2022 19:06   CT ABDOMEN PELVIS W CONTRAST  Result Date: 09/17/2022 CLINICAL DATA:  Abdominal pain.  History of Crohn's disease. EXAM: CT ABDOMEN AND PELVIS WITH CONTRAST TECHNIQUE: Multidetector CT imaging of the abdomen and pelvis was performed using the standard protocol following bolus administration of intravenous  contrast. RADIATION DOSE REDUCTION: This exam was performed according to the departmental dose-optimization program which includes automated exposure control, adjustment of the mA and/or kV according to patient size and/or use of iterative reconstruction technique. CONTRAST:  OMNIPAQUE IOHEXOL 300 MG/ML  SOLN COMPARISON:  October 28, 2017 FINDINGS: Lower chest: No acute abnormality. Hepatobiliary: Diffuse intrahepatic biliary ductal dilation. Post cholecystectomy. Pancreas: Unremarkable. No pancreatic ductal dilatation or surrounding inflammatory changes. Spleen: Normal in size without focal abnormality. Adrenals/Urinary Tract: Adrenal glands are unremarkable. Kidneys are normal, without renal calculi, focal lesion, or hydronephrosis. Bladder is unremarkable. 1.5 cm benign right renal cyst. Stomach/Bowel: The stomach is fluid-filled and moderately distended. Diffuse distension of small bowel loops which are also fluid-filled to the level of the distal ileum. Previous resection of the terminal ileum. Marked inflammatory changes and bowel wall thickening at the anastomosis causing a high-grade small bowel obstruction. The small bowel loops upstream of dilated to 5.3 cm. The more distal portion of the ileum demonstrates diffuse mucosal thickening. The colon is decompressed. Vascular/Lymphatic: No significant vascular findings are present. No enlarged abdominal or pelvic lymph nodes. Reproductive: Uterus and bilateral adnexa are unremarkable. Other: No abdominal wall hernia or abnormality. No abdominopelvic ascites. Musculoskeletal: Spondylosis at L5-S1. IMPRESSION: 1. High-grade small bowel obstruction secondary to marked inflammatory changes and bowel wall thickening versus volvulus at the anastomosis of the terminal ileum and right colon. 2. Diffuse mucosal thickening of the more distal portion of the ileum. 3. Diffuse intrahepatic biliary ductal dilation. 4. Post cholecystectomy. 5. Spondylosis at L5-S1.  Electronically Signed   By: Ted Mcalpine M.D.   On: 09/17/2022 15:06    Vassie Loll, MD  [facility]  If 7PM-7AM, please contact night-coverage [email or web address] Password [email or web address] 09/22/2022, 5:08 PM   LOS: 5 days

## 2022-09-22 NOTE — Telephone Encounter (Signed)
LMOM for pt to call office  

## 2022-09-22 NOTE — Care Management Important Message (Signed)
Important Message  Patient Details  Name: [name] MRN: [id number] Date of Birth: 10-03-1970   Medicare Important Message Given:  Yes     Corey Harold 09/22/2022, 11:21 AM

## 2022-09-23 ENCOUNTER — Other Ambulatory Visit: Payer: Self-pay | Admitting: Gastroenterology

## 2022-09-23 DIAGNOSIS — K56609 Unspecified intestinal obstruction, unspecified as to partial versus complete obstruction: Secondary | ICD-10-CM | POA: Diagnosis not present

## 2022-09-23 DIAGNOSIS — F319 Bipolar disorder, unspecified: Secondary | ICD-10-CM | POA: Diagnosis not present

## 2022-09-23 DIAGNOSIS — K50812 Crohn's disease of both small and large intestine with intestinal obstruction: Secondary | ICD-10-CM | POA: Diagnosis not present

## 2022-09-23 NOTE — Progress Notes (Signed)
Mindy, Orders placed for Stelara induction infusion. Please don't schedule actual infusion without approval by provider as we are not sure when patient is going to be discharged and start date will be based on clinical course.

## 2022-09-23 NOTE — Telephone Encounter (Signed)
Pt is in the hospital.

## 2022-09-23 NOTE — Progress Notes (Addendum)
Gastroenterology Progress Note   Referring Provider: No ref. provider found Primary Care Physician:  Kerri Perches, MD Primary Gastroenterologist:  Dr. Jena Gauss  Patient ID: [name]; [id number]; 05/27/1970    Subjective   Patient taking diet slow. Did have some gagging with bending over to move but states not the same as when she did clamp trial yesterday. Not as nauseas and no vomiting with clears. Had 2 small BM this morning and + flatus. Symptoms this morning not as severe as yesterday. Has not yet had trial of NG removal, only clamping. Still with some mid abdominal pain. States she will adhere to baby steps when attempting to advance diet.    Objective   Vital signs in last 24 hours Temp:  [97.8 F (36.6 C)-98.3 F (36.8 C)] 98 F (36.7 C) (07/19 1345) Pulse Rate:  [76-84] 82 (07/19 1345) Resp:  [17-18] 17 (07/19 1345) BP: (142-153)/(69-89) 142/89 (07/19 1345) SpO2:  [97 %-99 %] 99 % (07/19 1345) Last BM Date : 09/23/22  Physical Exam General:   Alert and oriented, pleasant.  Head:  Normocephalic and atraumatic. Eyes:  No icterus, sclera clear. Conjuctiva pink.  Mouth:  Without lesions, mucosa pink and moist.  Neck:  Supple, without thyromegaly or masses.  Abdomen:  NGT present - clamped. Bowel sounds present, soft, non-tender, non-distended. No HSM or hernias noted. No rebound or guarding. No masses appreciated  Neurologic:  Alert and  oriented x4;  grossly normal neurologically. Skin:  Warm and dry, intact without significant lesions.  Psych:  Alert and cooperative. Normal mood and affect.  Intake/Output from previous day: 07/18 0701 - 07/19 0700 In: 2592.8 [P.O.:480; I.V.:2072.8; NG/GT:40] Out: 302 [Urine:1; Emesis/NG output:300; Stool:1] Intake/Output this shift: No intake/output data recorded.  Lab Results  Recent Labs    09/21/22 0409  WBC 9.6  HGB 9.9*  HCT 31.3*  PLT 448*   BMET Recent Labs    09/21/22 0409  NA 140  K 3.9  CL 107   CO2 26  GLUCOSE 65*  BUN 8  CREATININE 0.59  CALCIUM 7.8*   LFT No results for input(s): "PROT", "ALBUMIN", "AST", "ALT", "ALKPHOS", "BILITOT", "BILIDIR", "IBILI" in the last 72 hours. PT/INR No results for input(s): "LABPROT", "INR" in the last 72 hours. Hepatitis Panel No results for input(s): "HEPBSAG", "HCVAB", "HEPAIGM", "HEPBIGM" in the last 72 hours.  Studies/Results DG Abd 1 View  Result Date: 09/18/2022 CLINICAL DATA:  Check gastric catheter placement EXAM: ABDOMEN - 1 VIEW COMPARISON:  None Available. FINDINGS: Gastric catheter extends into the stomach. The proximal side port now lies within the gastric lumen. Lungs are hypoinflated but clear. No bony abnormality is noted. IMPRESSION: Gastric catheter in satisfactory position. Electronically Signed   By: Alcide Clever M.D.   On: 09/18/2022 00:50   DG Abd 1 View  Result Date: 09/17/2022 CLINICAL DATA:  Check gastric catheter placement EXAM: ABDOMEN - 1 VIEW COMPARISON:  None Available. FINDINGS: Gastric catheter is noted with the tip in the stomach. Proximal side port lies at the gastroesophageal junction. This should be advanced deeper into the stomach. Lungs are clear but hypoinflated. No free air is noted in the abdomen. IMPRESSION: Gastric catheter as described. This should be advanced deeper into the stomach. Electronically Signed   By: Alcide Clever M.D.   On: 09/17/2022 21:52   DG Abd Portable 1V  Result Date: 09/17/2022 CLINICAL DATA:  NG tube placement. EXAM: PORTABLE ABDOMEN - 1 VIEW COMPARISON:  October 28, 2017 FINDINGS:  Enteric catheter terminates at the expected location of gastric body, side hole at the level of the GE junction. Partially visualized distension of small bowels. Elevation of the left hemidiaphragm. IMPRESSION: Enteric catheter terminates at the expected location of gastric body, side hole at the level of the GE junction. Advancement of the enteric catheter is recommended. Electronically Signed   By:  Ted Mcalpine M.D.   On: 09/17/2022 19:06   CT ABDOMEN PELVIS W CONTRAST  Result Date: 09/17/2022 CLINICAL DATA:  Abdominal pain.  History of Crohn's disease. EXAM: CT ABDOMEN AND PELVIS WITH CONTRAST TECHNIQUE: Multidetector CT imaging of the abdomen and pelvis was performed using the standard protocol following bolus administration of intravenous contrast. RADIATION DOSE REDUCTION: This exam was performed according to the departmental dose-optimization program which includes automated exposure control, adjustment of the mA and/or kV according to patient size and/or use of iterative reconstruction technique. CONTRAST:  OMNIPAQUE IOHEXOL 300 MG/ML  SOLN COMPARISON:  October 28, 2017 FINDINGS: Lower chest: No acute abnormality. Hepatobiliary: Diffuse intrahepatic biliary ductal dilation. Post cholecystectomy. Pancreas: Unremarkable. No pancreatic ductal dilatation or surrounding inflammatory changes. Spleen: Normal in size without focal abnormality. Adrenals/Urinary Tract: Adrenal glands are unremarkable. Kidneys are normal, without renal calculi, focal lesion, or hydronephrosis. Bladder is unremarkable. 1.5 cm benign right renal cyst. Stomach/Bowel: The stomach is fluid-filled and moderately distended. Diffuse distension of small bowel loops which are also fluid-filled to the level of the distal ileum. Previous resection of the terminal ileum. Marked inflammatory changes and bowel wall thickening at the anastomosis causing a high-grade small bowel obstruction. The small bowel loops upstream of dilated to 5.3 cm. The more distal portion of the ileum demonstrates diffuse mucosal thickening. The colon is decompressed. Vascular/Lymphatic: No significant vascular findings are present. No enlarged abdominal or pelvic lymph nodes. Reproductive: Uterus and bilateral adnexa are unremarkable. Other: No abdominal wall hernia or abnormality. No abdominopelvic ascites. Musculoskeletal: Spondylosis at L5-S1.  IMPRESSION: 1. High-grade small bowel obstruction secondary to marked inflammatory changes and bowel wall thickening versus volvulus at the anastomosis of the terminal ileum and right colon. 2. Diffuse mucosal thickening of the more distal portion of the ileum. 3. Diffuse intrahepatic biliary ductal dilation. 4. Post cholecystectomy. 5. Spondylosis at L5-S1. Electronically Signed   By: Ted Mcalpine M.D.   On: 09/17/2022 15:06    Assessment  52 y.o. female with a history of ileocolonic Chron's disease s/p ileocolonic resection in 2022, previously on Humira, GERD, PUD, bipolar, and sleep apnea who presented to the eD with abdominal pain and nausea with CT evidence of small obstruction secondary to inflammatory changes and bowel wall thickening at the ileocolonic anastomosis.   SBO/Chron's flare:  Has been off humira since April 2024 secondary to cost. Currently on day 6 of IV steroids (7/14). Surgery following and initially not recommended but if did require would recommend colorectal/IBD surgeon to perform. Patient requesting to trial different medication rather than restarting humira given cost issue. Has tried remicade in the past but with adverse effects of vomiting. Planning to start Stelara outpatient and bridge with budesonide pending clinical course (surgical intervention). Will need to wait at least 2 weeks after discharge to start induction dose).   Colonoscopy in March 2023 with evidence of prior surgical resection with chronic changes, stricture unable to be intubated with colonoscope, small ulcer and small nodule with biopsies revealing chronic active inflammation likely secondary to NSAIDs.   Given her slight improvement with nausea on clamp trial since yesterday evening we  will attempt removal of NGT today however had discussion with patient that if she had any worsening nausea or vomiting that tube will need to be reinserted and that we may likely need to transfer to tertiary center  with IBD surgeon as no improvement with IV steroids. She is agreeable to plan and agrees with slow progression of diet and slow intake.   Plan / Recommendations  Continue clear liquids Continue IV solumedrol 60 mg daily.  Stelara approval process being worked on outpatient, timing on induction dependent on clinical course and discharge timing.  Will likely need to be transitioned to oral steroids (budesonide) outpatient with taper after stelara induction.  Remove NGT today, reinsert if vomiting occurs.  Continue zofran as needed If no significant improvement in 24-48 hours then will likely need to consider surgical evaluation at tertiary center.     LOS: 6 days    09/23/2022, 2:26 PM   Brooke Bonito, MSN, FNP-BC, AGACNP-BC [facility]  I have reviewed the note and agree with the APP's assessment as described in this progress note  Patient has presented fluctuation in symptoms, had nausea with liquid diet before but never vomited. In fact, never had NGT removed but only was capped. Has moved bowels x2 in small amount, not too much gas is passing, still with some abdominal pain. Patient requesting to have NGT removed as she feels this is leading to increased gagging and throat discomfort. Will remove this and see how she tolerates diet. Discussed the fact that if she presents recurrent vomiting or abdominal distention/worsening pain, will need to place it again and may need to discuss possibility of transfer for surgical referral at larger center. Patient understood and agreed.  Will continue IV methylprednisolone 60 mg qday IV and DVT PPX with Lovenox.  Katrinka Blazing, MD Gastroenterology and Hepatology [facility]

## 2022-09-23 NOTE — Plan of Care (Signed)
  Problem: Education: Goal: Knowledge of General Education information will improve Description Including pain rating scale, medication(s)/side effects and non-pharmacologic comfort measures Outcome: Progressing   

## 2022-09-23 NOTE — Progress Notes (Signed)
PROGRESS NOTE  [name] NTI:[id number] DOB: [birth year] DOA: 09/17/2022 PCP: Kerri Perches, MD  Brief History:  52 year old female with a history of Crohn's disease on chronic Humira status post ileocolonic resection, GERD, bipolar disorder, OSA presenting with abdominal pain that began on 09/16/2022.  She had associated nausea and vomiting.  She denied fevers, chills, chest pain, shortness of breath.  She states that her abdominal pain was similar to her previous Crohn's exacerbations.  Because of worsening pain and vomiting, she presented for further evaluation and treatment. In the ED, the patient was afebrile and hemodynamically stable with oxygen saturation 90% room air.  WBC 14.1, hemoglobin 12.1, platelets 520,000.  Sodium 137, potassium 3.2, bicarbonate 28, serum creatinine 0.60.  LFTs were unremarkable.  CT of the abdomen and pelvis showed high-grade SBO secondary to marked inflammatory changes with bowel wall thickening at the level of the terminal ileum and right colon anastomosis.  There was diffuse mucosal thickening of the distal ileum. The patient was given a dose of IV Solu-Medrol.  GI and general surgery consulted to assist with management.  Upon further questioning, it was noted the patient had not received the Humira dose since April 2024.  05/10/2021 colonoscopy--noted the opening to the neoterminal ileum was very narrow; unable to pass/intubate with pediatric colonoscope   Assessment/Plan: Crohn's disease exacerbation/Small Bowel Obstruction -Case discussed with gastroenterology and general surgery.  Appreciate assistance and collaboration. -The patient's current obstruction secondary to Crohn's exacerbation with inflammatory changes worsening her established stricture at her anastomosis -Continue to maintain adequate hydration and supportive care. -judious opioids -Vague abdominal discomfort; reports passing gas and moving bowels. -Patient with clamped NGT  today and looking to slowly advance diet. Will discussed with GI regarding NG removal later if clamped test passed. -Continue steroids (IV Solu-Medrol).  GI service planning for biological agent for maintenance and bridging therapy with budenoside -Planning to be discharged home once able to tolerate diet.   Hypokalemia/Hypomagnesemia -Magnesium within normal limit -Continue to follow electrolytes and further replete as needed. -Most recent level 3.9.   Bipolar disorder -Restart Effexor and vistaril once able to tolerate p.o. -Hopefully later today after NG tube       Family Communication: No family at bedside on today's evaluation.   Consultants:  GI, general surgery   Code Status:  FULL    DVT Prophylaxis:   [place] Lovenox     Procedures: As Listed in Progress Note Above   Antibiotics: None   Subjective: No fever, no CP, no vomiting; still having nausea and some intermittent abd discomfort. NGT in place, currently clamped and tolerating sips of liquid diet.  Objective: Vitals:   09/22/22 0337 09/22/22 2129 09/23/22 0439 09/23/22 1345  BP: (!) 147/79 (!) 153/71 (!) 153/69 (!) 142/89  Pulse: 76 84 76 82  Resp: 16 18 18 17   Temp: 97.6 F (36.4 C) 98.3 F (36.8 C) 97.8 F (36.6 C) 98 F (36.7 C)  TempSrc:   Oral   SpO2: 97% 97% 99% 99%  Weight:      Height:        Intake/Output Summary (Last 24 hours) at 09/23/2022 1528 Last data filed at 09/23/2022 0320 Gross per 24 hour  Intake 2592.84 ml  Output --  Net 2592.84 ml   Weight change:   Exam: General exam: Alert, awake, oriented x 3, no fever, no CP. Still with intermittent nausea, but not further vomiting. Reports improvement in her  abd pain. Minimal output from NGT. Respiratory system: Clear to auscultation. Respiratory effort normal. Good sat on RA. Cardiovascular system:RRR. No rubs or gallops Gastrointestinal system: Abdomen is nondistended, soft and vaguely tender to palpation. Positive BS, no guarding  . Central nervous system: Alert and oriented. No focal neurological deficits. Extremities: No cyanosis, no clubbing. Skin: No petechiae. Psychiatry: Judgement and insight appear normal. Mood & affect appropriate.   Data Reviewed: I have personally reviewed following labs and imaging studies  Basic Metabolic Panel: Recent Labs  Lab 09/17/22 1145 09/17/22 1155 09/18/22 0415 09/19/22 0500 09/20/22 0412 09/21/22 0409  NA  --  139 137 138 142 140  K  --  3.0* 3.2* 3.2* 3.8 3.9  CL  --  97* 99 100 104 107  CO2  --  29 28 29 28 26   GLUCOSE  --  101* 107* 80 70 65*  BUN  --  8 9 12 12 8   CREATININE  --  0.63 0.60 0.59 0.67 0.59  CALCIUM  --  8.8* 8.1* 8.1* 8.1* 7.8*  MG 1.9  --   --  1.6* 2.0 1.6*   Liver Function Tests: Recent Labs  Lab 09/17/22 1155  AST 19  ALT 20  ALKPHOS 206*  BILITOT 0.4  PROT 7.8  ALBUMIN 2.8*   Recent Labs  Lab 09/17/22 1155  LIPASE 25   CBC: Recent Labs  Lab 09/17/22 1155 09/18/22 0415 09/19/22 0500 09/21/22 0409  WBC 14.1* 10.2 6.7 9.6  HGB 12.1 10.8* 10.1* 9.9*  HCT 37.4 33.8* 31.1* 31.3*  MCV 82.6 84.5 85.0 85.1  PLT 520* 493* 443* 448*   Urine analysis:    Component Value Date/Time   COLORURINE YELLOW 09/17/2022 1200   APPEARANCEUR CLEAR 09/17/2022 1200   LABSPEC 1.023 09/17/2022 1200   PHURINE 5.0 09/17/2022 1200   GLUCOSEU NEGATIVE 09/17/2022 1200   HGBUR NEGATIVE 09/17/2022 1200   HGBUR negative 08/10/2009 1013   BILIRUBINUR NEGATIVE 09/17/2022 1200   KETONESUR NEGATIVE 09/17/2022 1200   PROTEINUR 30 (A) 09/17/2022 1200   UROBILINOGEN 0.2 10/17/2010 1249   NITRITE NEGATIVE 09/17/2022 1200   LEUKOCYTESUR NEGATIVE 09/17/2022 1200   Scheduled Meds:  enoxaparin (LOVENOX) injection  40 mg Subcutaneous Q24H   methylPREDNISolone (SOLU-MEDROL) injection  60 mg Intravenous Daily   Continuous Infusions:  0.9 % NaCl with KCl 40 mEq / L 100 mL/hr at 09/23/22 0320   Procedures/Studies: DG Abd 1 View  Result Date:  09/18/2022 CLINICAL DATA:  Check gastric catheter placement EXAM: ABDOMEN - 1 VIEW COMPARISON:  None Available. FINDINGS: Gastric catheter extends into the stomach. The proximal side port now lies within the gastric lumen. Lungs are hypoinflated but clear. No bony abnormality is noted. IMPRESSION: Gastric catheter in satisfactory position. Electronically Signed   By: Alcide Clever M.D.   On: 09/18/2022 00:50   DG Abd 1 View  Result Date: 09/17/2022 CLINICAL DATA:  Check gastric catheter placement EXAM: ABDOMEN - 1 VIEW COMPARISON:  None Available. FINDINGS: Gastric catheter is noted with the tip in the stomach. Proximal side port lies at the gastroesophageal junction. This should be advanced deeper into the stomach. Lungs are clear but hypoinflated. No free air is noted in the abdomen. IMPRESSION: Gastric catheter as described. This should be advanced deeper into the stomach. Electronically Signed   By: Alcide Clever M.D.   On: 09/17/2022 21:52   DG Abd Portable 1V  Result Date: 09/17/2022 CLINICAL DATA:  NG tube placement. EXAM: PORTABLE ABDOMEN - 1 VIEW  COMPARISON:  October 28, 2017 FINDINGS: Enteric catheter terminates at the expected location of gastric body, side hole at the level of the GE junction. Partially visualized distension of small bowels. Elevation of the left hemidiaphragm. IMPRESSION: Enteric catheter terminates at the expected location of gastric body, side hole at the level of the GE junction. Advancement of the enteric catheter is recommended. Electronically Signed   By: Ted Mcalpine M.D.   On: 09/17/2022 19:06   CT ABDOMEN PELVIS W CONTRAST  Result Date: 09/17/2022 CLINICAL DATA:  Abdominal pain.  History of Crohn's disease. EXAM: CT ABDOMEN AND PELVIS WITH CONTRAST TECHNIQUE: Multidetector CT imaging of the abdomen and pelvis was performed using the standard protocol following bolus administration of intravenous contrast. RADIATION DOSE REDUCTION: This exam was performed  according to the departmental dose-optimization program which includes automated exposure control, adjustment of the mA and/or kV according to patient size and/or use of iterative reconstruction technique. CONTRAST:  OMNIPAQUE IOHEXOL 300 MG/ML  SOLN COMPARISON:  October 28, 2017 FINDINGS: Lower chest: No acute abnormality. Hepatobiliary: Diffuse intrahepatic biliary ductal dilation. Post cholecystectomy. Pancreas: Unremarkable. No pancreatic ductal dilatation or surrounding inflammatory changes. Spleen: Normal in size without focal abnormality. Adrenals/Urinary Tract: Adrenal glands are unremarkable. Kidneys are normal, without renal calculi, focal lesion, or hydronephrosis. Bladder is unremarkable. 1.5 cm benign right renal cyst. Stomach/Bowel: The stomach is fluid-filled and moderately distended. Diffuse distension of small bowel loops which are also fluid-filled to the level of the distal ileum. Previous resection of the terminal ileum. Marked inflammatory changes and bowel wall thickening at the anastomosis causing a high-grade small bowel obstruction. The small bowel loops upstream of dilated to 5.3 cm. The more distal portion of the ileum demonstrates diffuse mucosal thickening. The colon is decompressed. Vascular/Lymphatic: No significant vascular findings are present. No enlarged abdominal or pelvic lymph nodes. Reproductive: Uterus and bilateral adnexa are unremarkable. Other: No abdominal wall hernia or abnormality. No abdominopelvic ascites. Musculoskeletal: Spondylosis at L5-S1. IMPRESSION: 1. High-grade small bowel obstruction secondary to marked inflammatory changes and bowel wall thickening versus volvulus at the anastomosis of the terminal ileum and right colon. 2. Diffuse mucosal thickening of the more distal portion of the ileum. 3. Diffuse intrahepatic biliary ductal dilation. 4. Post cholecystectomy. 5. Spondylosis at L5-S1. Electronically Signed   By: Ted Mcalpine M.D.   On:  09/17/2022 15:06    Vassie Loll, MD  [facility]  If 7PM-7AM, please contact night-coverage [email or web address] Password [email or web address] 09/23/2022, 3:28 PM   LOS: 6 days

## 2022-09-24 ENCOUNTER — Ambulatory Visit (HOSPITAL_COMMUNITY): Payer: Medicare HMO | Admitting: Psychiatry

## 2022-09-24 ENCOUNTER — Encounter (HOSPITAL_COMMUNITY): Payer: Self-pay

## 2022-09-24 DIAGNOSIS — K56609 Unspecified intestinal obstruction, unspecified as to partial versus complete obstruction: Secondary | ICD-10-CM | POA: Diagnosis not present

## 2022-09-24 DIAGNOSIS — K50812 Crohn's disease of both small and large intestine with intestinal obstruction: Secondary | ICD-10-CM | POA: Diagnosis not present

## 2022-09-24 DIAGNOSIS — F319 Bipolar disorder, unspecified: Secondary | ICD-10-CM | POA: Diagnosis not present

## 2022-09-24 DIAGNOSIS — Z91199 Patient's noncompliance with other medical treatment and regimen due to unspecified reason: Secondary | ICD-10-CM

## 2022-09-24 LAB — CREATININE, SERUM
Creatinine, Ser: 0.52 mg/dL (ref 0.44–1.00)
GFR, Estimated: >60 mL/min (ref >60)

## 2022-09-24 NOTE — Progress Notes (Signed)
Patient is alert and oriented x4. Ambulated to restroom. Complained of abdominal pain and received dilaudid x1. No nausea or vomiting throughout the night. Denied additional needs.

## 2022-09-24 NOTE — Progress Notes (Signed)
PROGRESS NOTE  ADYLENE DLUGOSZ FGH:[id number] DOB: [birth year] DOA: 09/17/2022 PCP: Kerri Perches, MD  Brief History:  52 year old female with a history of Crohn's disease on chronic Humira status post ileocolonic resection, GERD, bipolar disorder, OSA presenting with abdominal pain that began on 09/16/2022.  She had associated nausea and vomiting.  She denied fevers, chills, chest pain, shortness of breath.  She states that her abdominal pain was similar to her previous Crohn's exacerbations.  Because of worsening pain and vomiting, she presented for further evaluation and treatment. In the ED, the patient was afebrile and hemodynamically stable with oxygen saturation 90% room air.  WBC 14.1, hemoglobin 12.1, platelets 520,000.  Sodium 137, potassium 3.2, bicarbonate 28, serum creatinine 0.60.  LFTs were unremarkable.  CT of the abdomen and pelvis showed high-grade SBO secondary to marked inflammatory changes with bowel wall thickening at the level of the terminal ileum and right colon anastomosis.  There was diffuse mucosal thickening of the distal ileum. The patient was given a dose of IV Solu-Medrol.  GI and general surgery consulted to assist with management.  Upon further questioning, it was noted the patient had not received the Humira dose since April 2024.  05/10/2021 colonoscopy--noted the opening to the neoterminal ileum was very narrow; unable to pass/intubate with pediatric colonoscope   Assessment/Plan: Crohn's disease exacerbation/Small Bowel Obstruction -Case discussed with gastroenterology and general surgery.  Appreciate assistance and collaboration. -The patient's current obstruction secondary to Crohn's exacerbation with inflammatory changes worsening her established stricture at her anastomosis -Continue to maintain adequate hydration and supportive care. -judious opioids -Vague abdominal discomfort; reports passing gas and moving bowels.  No further vomiting;  expressing intermittent nausea. -Continue steroids (IV Solu-Medrol) while inpatient; NG tube has been removed and patient's diet further advance (full liquid diet). -Continue to follow clinical response. -Planning to be discharged home once able to tolerate diet.   Hypokalemia/Hypomagnesemia -Magnesium within normal limit -Continue to follow electrolytes and further replete as needed. -Most recent level 3.9.   Bipolar disorder -Restart Effexor and vistaril once able to tolerate p.o.     Family Communication: Husband at bedside.   Consultants:  GI, general surgery   Code Status:  FULL    DVT Prophylaxis:   [place] Lovenox     Procedures: As Listed in Progress Note Above   Antibiotics: None   Subjective: NG tube has been removed; no vomiting, fever, chest pain or shortness of breath.  Still experiencing intermittent nausea and some intermittent abdominal discomfort.  Passing gas and moving her bowels.  Objective: Vitals:   09/23/22 0439 09/23/22 1345 09/23/22 2008 09/24/22 0459  BP: (!) 153/69 (!) 142/89 (!) 146/68 (!) 159/82  Pulse: 76 82 76 73  Resp: 18 17 17 17   Temp: 97.8 F (36.6 C) 98 F (36.7 C) 98.4 F (36.9 C) 97.8 F (36.6 C)  TempSrc: Oral  Oral Oral  SpO2: 99% 99% 99% 100%  Weight:      Height:        Intake/Output Summary (Last 24 hours) at 09/24/2022 1322 Last data filed at 09/23/2022 2105 Gross per 24 hour  Intake 480 ml  Output --  Net 480 ml   Weight change:   Exam: General exam: Alert, awake, oriented x 3; expressed to be less nauseated; still experiencing intermittent abdominal discomfort no further vomiting.  NG tube has been removed. Respiratory system: Clear to auscultation. Respiratory effort normal.  Good saturation on room  air. Cardiovascular system:RRR. No murmurs, rubs, gallops. Gastrointestinal system: Abdomen is nondistended, soft and without any guarding.  No organomegaly or masses felt. Normal bowel sounds heard. Central nervous  system: Alert and oriented. No focal neurological deficits. Extremities: No cyanosis or clubbing. Skin: No petechiae. Psychiatry: Judgement and insight appear normal. Mood & affect appropriate.   Data Reviewed: I have personally reviewed following labs and imaging studies  Basic Metabolic Panel: Recent Labs  Lab 09/18/22 0415 09/19/22 0500 09/20/22 0412 09/21/22 0409 09/24/22 0404  NA 137 138 142 140  --   K 3.2* 3.2* 3.8 3.9  --   CL 99 100 104 107  --   CO2 28 29 28 26   --   GLUCOSE 107* 80 70 65*  --   BUN 9 12 12 8   --   CREATININE 0.60 0.59 0.67 0.59 0.52  CALCIUM 8.1* 8.1* 8.1* 7.8*  --   MG  --  1.6* 2.0 1.6*  --    CBC: Recent Labs  Lab 09/18/22 0415 09/19/22 0500 09/21/22 0409  WBC 10.2 6.7 9.6  HGB 10.8* 10.1* 9.9*  HCT 33.8* 31.1* 31.3*  MCV 84.5 85.0 85.1  PLT 493* 443* 448*   Urine analysis:    Component Value Date/Time   COLORURINE YELLOW 09/17/2022 1200   APPEARANCEUR CLEAR 09/17/2022 1200   LABSPEC 1.023 09/17/2022 1200   PHURINE 5.0 09/17/2022 1200   GLUCOSEU NEGATIVE 09/17/2022 1200   HGBUR NEGATIVE 09/17/2022 1200   HGBUR negative 08/10/2009 1013   BILIRUBINUR NEGATIVE 09/17/2022 1200   KETONESUR NEGATIVE 09/17/2022 1200   PROTEINUR 30 (A) 09/17/2022 1200   UROBILINOGEN 0.2 10/17/2010 1249   NITRITE NEGATIVE 09/17/2022 1200   LEUKOCYTESUR NEGATIVE 09/17/2022 1200   Scheduled Meds:  enoxaparin (LOVENOX) injection  40 mg Subcutaneous Q24H   methylPREDNISolone (SOLU-MEDROL) injection  60 mg Intravenous Daily   Continuous Infusions:  0.9 % NaCl with KCl 40 mEq / L 100 mL/hr at 09/24/22 1610   Procedures/Studies: DG Abd 1 View  Result Date: 09/18/2022 CLINICAL DATA:  Check gastric catheter placement EXAM: ABDOMEN - 1 VIEW COMPARISON:  None Available. FINDINGS: Gastric catheter extends into the stomach. The proximal side port now lies within the gastric lumen. Lungs are hypoinflated but clear. No bony abnormality is noted. IMPRESSION:  Gastric catheter in satisfactory position. Electronically Signed   By: Alcide Clever M.D.   On: 09/18/2022 00:50   DG Abd 1 View  Result Date: 09/17/2022 CLINICAL DATA:  Check gastric catheter placement EXAM: ABDOMEN - 1 VIEW COMPARISON:  None Available. FINDINGS: Gastric catheter is noted with the tip in the stomach. Proximal side port lies at the gastroesophageal junction. This should be advanced deeper into the stomach. Lungs are clear but hypoinflated. No free air is noted in the abdomen. IMPRESSION: Gastric catheter as described. This should be advanced deeper into the stomach. Electronically Signed   By: Alcide Clever M.D.   On: 09/17/2022 21:52   DG Abd Portable 1V  Result Date: 09/17/2022 CLINICAL DATA:  NG tube placement. EXAM: PORTABLE ABDOMEN - 1 VIEW COMPARISON:  October 28, 2017 FINDINGS: Enteric catheter terminates at the expected location of gastric body, side hole at the level of the GE junction. Partially visualized distension of small bowels. Elevation of the left hemidiaphragm. IMPRESSION: Enteric catheter terminates at the expected location of gastric body, side hole at the level of the GE junction. Advancement of the enteric catheter is recommended. Electronically Signed   By: Ulanda Edison.D.  On: 09/17/2022 19:06   CT ABDOMEN PELVIS W CONTRAST  Result Date: 09/17/2022 CLINICAL DATA:  Abdominal pain.  History of Crohn's disease. EXAM: CT ABDOMEN AND PELVIS WITH CONTRAST TECHNIQUE: Multidetector CT imaging of the abdomen and pelvis was performed using the standard protocol following bolus administration of intravenous contrast. RADIATION DOSE REDUCTION: This exam was performed according to the departmental dose-optimization program which includes automated exposure control, adjustment of the mA and/or kV according to patient size and/or use of iterative reconstruction technique. CONTRAST:  OMNIPAQUE IOHEXOL 300 MG/ML  SOLN COMPARISON:  October 28, 2017 FINDINGS: Lower  chest: No acute abnormality. Hepatobiliary: Diffuse intrahepatic biliary ductal dilation. Post cholecystectomy. Pancreas: Unremarkable. No pancreatic ductal dilatation or surrounding inflammatory changes. Spleen: Normal in size without focal abnormality. Adrenals/Urinary Tract: Adrenal glands are unremarkable. Kidneys are normal, without renal calculi, focal lesion, or hydronephrosis. Bladder is unremarkable. 1.5 cm benign right renal cyst. Stomach/Bowel: The stomach is fluid-filled and moderately distended. Diffuse distension of small bowel loops which are also fluid-filled to the level of the distal ileum. Previous resection of the terminal ileum. Marked inflammatory changes and bowel wall thickening at the anastomosis causing a high-grade small bowel obstruction. The small bowel loops upstream of dilated to 5.3 cm. The more distal portion of the ileum demonstrates diffuse mucosal thickening. The colon is decompressed. Vascular/Lymphatic: No significant vascular findings are present. No enlarged abdominal or pelvic lymph nodes. Reproductive: Uterus and bilateral adnexa are unremarkable. Other: No abdominal wall hernia or abnormality. No abdominopelvic ascites. Musculoskeletal: Spondylosis at L5-S1. IMPRESSION: 1. High-grade small bowel obstruction secondary to marked inflammatory changes and bowel wall thickening versus volvulus at the anastomosis of the terminal ileum and right colon. 2. Diffuse mucosal thickening of the more distal portion of the ileum. 3. Diffuse intrahepatic biliary ductal dilation. 4. Post cholecystectomy. 5. Spondylosis at L5-S1. Electronically Signed   By: Ted Mcalpine M.D.   On: 09/17/2022 15:06    Vassie Loll, MD  [facility]  If 7PM-7AM, please contact night-coverage [email or web address] Password [email or web address] 09/24/2022, 1:22 PM   LOS: 7 days

## 2022-09-24 NOTE — Plan of Care (Signed)

## 2022-09-24 NOTE — Progress Notes (Signed)
[name], M.D. Gastroenterology & Hepatology   Interval History:  Patient reports feeling somewhat better.  States that she was able to tolerate clear liquids but did not want to try grape juice as she was afraid will cause nausea.  Has had some lower abdominal pain but not severe.  No fever, chills, melena or hematochezia.  Had 3 small soft bowel movements and is passing gas.  Inpatient Medications:  Current Facility-Administered Medications:    0.9 % NaCl with KCl 40 mEq / L  infusion, , Intravenous, Continuous, Vassie Loll, MD, Last Rate: 100 mL/hr at 09/24/22 0832, New Bag at 09/24/22 0832   enoxaparin (LOVENOX) injection 40 mg, 40 mg, Subcutaneous, Q24H, Levie Heritage, DO, 40 mg at 09/23/22 2105   HYDROmorphone (DILAUDID) injection 1 mg, 1 mg, Intravenous, Q4H PRN, Levie Heritage, DO, 1 mg at 09/24/22 1012   methylPREDNISolone sodium succinate (SOLU-MEDROL) 125 mg/2 mL injection 60 mg, 60 mg, Intravenous, Daily, Levie Heritage, DO, 60 mg at 09/24/22 0828   ondansetron (ZOFRAN) tablet 4 mg, 4 mg, Oral, Q6H PRN **OR** ondansetron (ZOFRAN) injection 4 mg, 4 mg, Intravenous, Q6H PRN, Levie Heritage, DO, 4 mg at 09/22/22 0715   Oral care mouth rinse, 15 mL, Mouth Rinse, PRN, Vassie Loll, MD   phenol (CHLORASEPTIC) mouth spray 1 spray, 1 spray, Mouth/Throat, PRN, Levie Heritage, DO, 1 spray at 09/18/22 0709   sodium chloride (OCEAN) 0.65 % nasal spray 1 spray, 1 spray, Each Nare, PRN, Adefeso, Oladapo, DO, 1 spray at 09/18/22 2018   I/O    Intake/Output Summary (Last 24 hours) at 09/24/2022 1056 Last data filed at 09/23/2022 2105 Gross per 24 hour  Intake 480 ml  Output --  Net 480 ml     Physical Exam: Temp:  [97.8 F (36.6 C)-98.4 F (36.9 C)] 97.8 F (36.6 C) (07/20 0459) Pulse Rate:  [73-82] 73 (07/20 0459) Resp:  [17] 17 (07/20 0459) BP: (142-159)/(68-89) 159/82 (07/20 0459) SpO2:  [99 %-100 %] 100 % (07/20 0459)  Temp (24hrs), Avg:98.1 F (36.7 C),  Min:97.8 F (36.6 C), Max:98.4 F (36.9 C) GENERAL: The patient is AO x3, in no acute distress. HEENT: Head is normocephalic and atraumatic. EOMI are intact. Mouth is well hydrated and without lesions. NECK: Supple. No masses LUNGS: Clear to auscultation. No presence of rhonchi/wheezing/rales. Adequate chest expansion HEART: RRR, normal s1 and s2. ABDOMEN: mildly tender in lower abdomen, no guarding, no peritoneal signs, and nondistended. BS +. No masses. EXTREMITIES: Without any cyanosis, clubbing, rash, lesions or edema. NEUROLOGIC: AOx3, no focal motor deficit. SKIN: no jaundice, no rashes  Laboratory Data: CBC:     Component Value Date/Time   WBC 9.6 09/21/2022 0409   RBC 3.68 (L) 09/21/2022 0409   HGB 9.9 (L) 09/21/2022 0409   HGB 12.2 09/20/2017 1211   HCT 31.3 (L) 09/21/2022 0409   HCT 38.5 09/20/2017 1211   PLT 448 (H) 09/21/2022 0409   PLT 444 09/20/2017 1211   MCV 85.1 09/21/2022 0409   MCV 88 09/20/2017 1211   MCH 26.9 09/21/2022 0409   MCHC 31.6 09/21/2022 0409   RDW 16.4 (H) 09/21/2022 0409   RDW 14.8 09/20/2017 1211   LYMPHSABS 3.5 04/08/2022 1058   LYMPHSABS 6.7 (H) 09/20/2017 1211   MONOABS 0.6 04/08/2022 1058   EOSABS 0.4 04/08/2022 1058   EOSABS 0.6 (H) 09/20/2017 1211   BASOSABS 0.1 04/08/2022 1058   BASOSABS 0.1 09/20/2017 1211   COAG: No results found for: "INR", "  PROTIME"  BMP:     Latest Ref Rng & Units 09/24/2022    4:04 AM 09/21/2022    4:09 AM 09/20/2022    4:12 AM  BMP  Glucose 70 - 99 mg/dL  65  70   BUN 6 - 20 mg/dL  8  12   Creatinine 1.61 - 1.00 mg/dL 0.96  0.45  4.09   Sodium 135 - 145 mmol/L  140  142   Potassium 3.5 - 5.1 mmol/L  3.9  3.8   Chloride 98 - 111 mmol/L  107  104   CO2 22 - 32 mmol/L  26  28   Calcium 8.9 - 10.3 mg/dL  7.8  8.1     HEPATIC:     Latest Ref Rng & Units 09/17/2022   11:55 AM 01/04/2022    9:01 AM 01/01/2021    2:25 PM  Hepatic Function  Total Protein 6.5 - 8.1 g/dL 7.8  6.9  6.8   Albumin 3.5 - 5.0  g/dL 2.8  3.6  3.7   AST 15 - 41 U/L 19  17  19    ALT 0 - 44 U/L 20  13  20    Alk Phosphatase 38 - 126 U/L 206  71  93   Total Bilirubin 0.3 - 1.2 mg/dL 0.4  <8.1  <1.9     CARDIAC:  Lab Results  Component Value Date   CKTOTAL 63 05/30/2016      Imaging: I personally reviewed and interpreted the available labs, imaging and endoscopic files.   Assessment/Plan: Briefly, this is a 52 year old female with past medical history of ileocolonic Chron's disease s/p ileocolonic resection in 2022, GERD, PUD, bipolar, and sleep apnea, who was admitted to the hospital after presenting abdominal pain and nausea with findings concerning for small bowel obstruction due to uncontrolled Crohn's in the setting of Humira treatment interruption.  Unfortunately, the patient's insurance did not cover Humira anymore and she could not pay this medication.  Has tried Remicade in the past.  Imaging during current hospitalization showed severe stricturing disease in the setting of previous surgery.  She has been started on IV steroids and has slowly improved during the current hospitalization.  Has been able to tolerate some clear liquids.  Will advance her diet to full liquid diet without lactose and assess symptom improvement.  She will need to continue with Zofran as needed and pain control per hospitalist (ideally using antispasmodics instead of opiates, if possible).  We have had lengthy discussions regarding the possibility of having an evaluation by a tertiary surgical center if she is not presenting any improvement during the current hospitalization.  If she is improving, she will need a long steroid taper with plan for Stelara as outpatient (insurance benefits verified, this is the medication that will be covered by her insurance).  Advance to full liquid diet, no lactose Continue IV solumedrol 60 mg daily.  Stelara approval process being worked on outpatient, timing on induction dependent on clinical course and  discharge timing.  Will likely need to be transitioned to oral steroid taper, given severity of inflammation may choose prednisone Remove NGT today, reinsert if vomiting occurs.  Continue zofran as needed If no significant improvement in 24-48 hours then will likely need to consider surgical evaluation at tertiary center.  Lovenox 40 mg SQ every day  Michele Blazing, MD Gastroenterology and Hepatology [facility]

## 2022-09-25 DIAGNOSIS — K50812 Crohn's disease of both small and large intestine with intestinal obstruction: Secondary | ICD-10-CM | POA: Diagnosis not present

## 2022-09-25 DIAGNOSIS — F319 Bipolar disorder, unspecified: Secondary | ICD-10-CM | POA: Diagnosis not present

## 2022-09-25 DIAGNOSIS — K56609 Unspecified intestinal obstruction, unspecified as to partial versus complete obstruction: Secondary | ICD-10-CM | POA: Diagnosis not present

## 2022-09-25 NOTE — Progress Notes (Addendum)
Katrinka Blazing, M.D. Gastroenterology & Hepatology   Interval History:  Patient reports feeling somewhat better, endorses having some nausea yesterday and decided to eat some crackers which helped with the nausea.  Still having some pain in the lower abdomen but no abdominal distention.  Had a bowel movement in the afternoon but no bowel movements today so far.  Passing gas.  No blood in the stool.  Inpatient Medications:  Current Facility-Administered Medications:    0.9 % NaCl with KCl 40 mEq / L  infusion, , Intravenous, Continuous, Vassie Loll, MD, Last Rate: 100 mL/hr at 09/24/22 1836, New Bag at 09/24/22 1836   enoxaparin (LOVENOX) injection 40 mg, 40 mg, Subcutaneous, Q24H, Stinson, Jacob J, DO, 40 mg at 09/24/22 2100   HYDROmorphone (DILAUDID) injection 1 mg, 1 mg, Intravenous, Q4H PRN, Levie Heritage, DO, 1 mg at 09/25/22 0601   methylPREDNISolone sodium succinate (SOLU-MEDROL) 125 mg/2 mL injection 60 mg, 60 mg, Intravenous, Daily, Levie Heritage, DO, 60 mg at 09/25/22 0821   ondansetron (ZOFRAN) tablet 4 mg, 4 mg, Oral, Q6H PRN, 4 mg at 09/25/22 0601 **OR** ondansetron (ZOFRAN) injection 4 mg, 4 mg, Intravenous, Q6H PRN, Levie Heritage, DO, 4 mg at 09/24/22 2003   Oral care mouth rinse, 15 mL, Mouth Rinse, PRN, Vassie Loll, MD   phenol (CHLORASEPTIC) mouth spray 1 spray, 1 spray, Mouth/Throat, PRN, Levie Heritage, DO, 1 spray at 09/18/22 0709   sodium chloride (OCEAN) 0.65 % nasal spray 1 spray, 1 spray, Each Nare, PRN, Adefeso, Oladapo, DO, 1 spray at 09/18/22 2018   I/O    Intake/Output Summary (Last 24 hours) at 09/25/2022 0926 Last data filed at 09/24/2022 1300 Gross per 24 hour  Intake 240 ml  Output --  Net 240 ml     Physical Exam: Temp:  [97.7 F (36.5 C)-98.3 F (36.8 C)] 97.7 F (36.5 C) (07/21 0357) Pulse Rate:  [79-93] 79 (07/21 0357) Resp:  [18-19] 19 (07/21 0357) BP: (137-147)/(70-73) 141/71 (07/21 0357) SpO2:  [95 %-100 %] 100 % (07/21  0357)  Temp (24hrs), Avg:98.1 F (36.7 C), Min:97.7 F (36.5 C), Max:98.3 F (36.8 C) GENERAL: The patient is AO x3, in no acute distress. HEENT: Head is normocephalic and atraumatic. EOMI are intact. Mouth is well hydrated and without lesions. NECK: Supple. No masses LUNGS: Clear to auscultation. No presence of rhonchi/wheezing/rales. Adequate chest expansion HEART: RRR, normal s1 and s2. ABDOMEN: mildly tender in lower abdomen, no guarding, no peritoneal signs, and nondistended. BS +. No masses. EXTREMITIES: Without any cyanosis, clubbing, rash, lesions or edema. NEUROLOGIC: AOx3, no focal motor deficit. SKIN: no jaundice, no rashes  Laboratory Data: CBC:     Component Value Date/Time   WBC 9.6 09/21/2022 0409   RBC 3.68 (L) 09/21/2022 0409   HGB 9.9 (L) 09/21/2022 0409   HGB 12.2 09/20/2017 1211   HCT 31.3 (L) 09/21/2022 0409   HCT 38.5 09/20/2017 1211   PLT 448 (H) 09/21/2022 0409   PLT 444 09/20/2017 1211   MCV 85.1 09/21/2022 0409   MCV 88 09/20/2017 1211   MCH 26.9 09/21/2022 0409   MCHC 31.6 09/21/2022 0409   RDW 16.4 (H) 09/21/2022 0409   RDW 14.8 09/20/2017 1211   LYMPHSABS 3.5 04/08/2022 1058   LYMPHSABS 6.7 (H) 09/20/2017 1211   MONOABS 0.6 04/08/2022 1058   EOSABS 0.4 04/08/2022 1058   EOSABS 0.6 (H) 09/20/2017 1211   BASOSABS 0.1 04/08/2022 1058   BASOSABS 0.1 09/20/2017 1211   COAG:  No results found for: "INR", "PROTIME"  BMP:     Latest Ref Rng & Units 09/24/2022    4:04 AM 09/21/2022    4:09 AM 09/20/2022    4:12 AM  BMP  Glucose 70 - 99 mg/dL  65  70   BUN 6 - 20 mg/dL  8  12   Creatinine 5.62 - 1.00 mg/dL 1.30  8.65  7.84   Sodium 135 - 145 mmol/L  140  142   Potassium 3.5 - 5.1 mmol/L  3.9  3.8   Chloride 98 - 111 mmol/L  107  104   CO2 22 - 32 mmol/L  26  28   Calcium 8.9 - 10.3 mg/dL  7.8  8.1     HEPATIC:     Latest Ref Rng & Units 09/17/2022   11:55 AM 01/04/2022    9:01 AM 01/01/2021    2:25 PM  Hepatic Function  Total Protein 6.5  - 8.1 g/dL 7.8  6.9  6.8   Albumin 3.5 - 5.0 g/dL 2.8  3.6  3.7   AST 15 - 41 U/L 19  17  19    ALT 0 - 44 U/L 20  13  20    Alk Phosphatase 38 - 126 U/L 206  71  93   Total Bilirubin 0.3 - 1.2 mg/dL 0.4  <6.9  <6.2     CARDIAC:  Lab Results  Component Value Date   CKTOTAL 63 05/30/2016      Imaging: I personally reviewed and interpreted the available labs, imaging and endoscopic files.   Assessment/Plan: Briefly, this is a 52 year old female with past medical history of ileocolonic Chron's disease s/p ileocolonic resection in 2022, GERD, PUD, bipolar, and sleep apnea, who was admitted to the hospital after presenting abdominal pain and nausea with findings concerning for small bowel obstruction due to uncontrolled Crohn's in the setting of Humira treatment interruption.  Unfortunately, the patient's insurance did not cover Humira anymore and she could not pay this medication.  Has tried Remicade in the past.   Imaging during current hospitalization showed severe inflammation with stricturing disease at the anastomosis in the setting of previous surgery.  She has been started on IV steroids and has slowly improved during the current hospitalization.  Has been able to tolerate liquid diet yesterday.  He is interested in trying more consistent food, will change her diet to GI soft.  She will need to continue with Zofran as needed and pain control per hospitalist (ideally using antispasmodics instead of opiates, if possible).  We have had lengthy discussions regarding the possibility of having an evaluation by a tertiary surgical center if she is not presenting any improvement during the current hospitalization.  If she is improving, she will need a long steroid taper with plan for Stelara as outpatient (insurance benefits verified, this is the medication that will be covered by her insurance).   Advance to GI soft, no lactose Continue IV solumedrol 60 mg daily.  Stelara approval process being  worked on outpatient, timing on induction dependent on clinical course and discharge timing.  Will likely need to be transitioned to oral steroid taper, given severity of inflammation may favor prednisone long taper Remove NGT today, reinsert if vomiting occurs.  Continue zofran as needed If no significant improvement in 24-48 hours then will likely need to consider surgical evaluation at tertiary center.  Lovenox 40 mg SQ every day   Katrinka Blazing, MD Gastroenterology and Hepatology [facility] Gastroenterology

## 2022-09-25 NOTE — Progress Notes (Signed)
PROGRESS NOTE  [name] RSW:[id number] DOB: April 10, 1970 DOA: 09/17/2022 PCP: Kerri Perches, MD  Brief History:  52 year old female with a history of Crohn's disease on chronic Humira status post ileocolonic resection, GERD, bipolar disorder, OSA presenting with abdominal pain that began on 09/16/2022.  She had associated nausea and vomiting.  She denied fevers, chills, chest pain, shortness of breath.  She states that her abdominal pain was similar to her previous Crohn's exacerbations.  Because of worsening pain and vomiting, she presented for further evaluation and treatment. In the ED, the patient was afebrile and hemodynamically stable with oxygen saturation 90% room air.  WBC 14.1, hemoglobin 12.1, platelets 520,000.  Sodium 137, potassium 3.2, bicarbonate 28, serum creatinine 0.60.  LFTs were unremarkable.  CT of the abdomen and pelvis showed high-grade SBO secondary to marked inflammatory changes with bowel wall thickening at the level of the terminal ileum and right colon anastomosis.  There was diffuse mucosal thickening of the distal ileum. The patient was given a dose of IV Solu-Medrol.  GI and general surgery consulted to assist with management.  Upon further questioning, it was noted the patient had not received the Humira dose since April 2024.  05/10/2021 colonoscopy--noted the opening to the neoterminal ileum was very narrow; unable to pass/intubate with pediatric colonoscope   Assessment/Plan: Crohn's disease exacerbation/Small Bowel Obstruction -Case discussed with gastroenterology and general surgery.  Appreciate assistance and collaboration. -The patient's current obstruction secondary to Crohn's exacerbation with inflammatory changes worsening her established stricture at her anastomosis -Continue to maintain adequate hydration and supportive care. -judious opioids -Vague abdominal discomfort; reports passing gas and moving bowels.  No further vomiting;  expressing intermittent nausea. -Continue steroids (IV Solu-Medrol) while inpatient; NG tube has been removed and after tolerating full liquid plan is to advance to soft diet. -If tolerated hopefully will be able to go home with extended steroids tapering on 09/26/2022. -Continue to follow clinical response. -Planning to be discharged home once able to tolerate diet.   Hypokalemia/Hypomagnesemia -Magnesium within normal limit -Continue to follow electrolytes and further replete as needed. -Most recent level 3.9.   Bipolar disorder -Restart Effexor and vistaril once able to tolerate p.o.     Family Communication: Husband at bedside.   Consultants:  GI, general surgery   Code Status:  FULL    DVT Prophylaxis:   Sharon Lovenox     Procedures: As Listed in Progress Note Above   Antibiotics: None   Subjective: Passing gas, moving bowels and reporting no vomiting.  Continues to have intermittent abdominal pain and nausea.  For the most part tolerated full liquid diet and is agreeable to advance to soft diet.  Objective: Vitals:   09/24/22 0459 09/24/22 1347 09/24/22 2018 09/25/22 0357  BP: (!) 159/82 137/70 (!) 147/73 (!) 141/71  Pulse: 73 93 79 79  Resp: 17 18 18 19   Temp: 97.8 F (36.6 C) 98.3 F (36.8 C) 98.3 F (36.8 C) 97.7 F (36.5 C)  TempSrc: Oral Oral Oral Oral  SpO2: 100% 98% 95% 100%  Weight:      Height:        Intake/Output Summary (Last 24 hours) at 09/25/2022 1016 Last data filed at 09/24/2022 1300 Gross per 24 hour  Intake 240 ml  Output --  Net 240 ml   Weight change:   Exam: General exam: Alert, awake, oriented x 3; no chest pain, no vomiting, no shortness of breath.  Continues  to pass gas and is moving her bowels. Respiratory system: Clear to auscultation. Respiratory effort normal.  Good saturation on room air. Cardiovascular system:RRR. No murmurs, rubs, gallops. Gastrointestinal system: Abdomen is nondistended, soft and nontender. No  organomegaly or masses felt. Normal bowel sounds heard. Central nervous system: No focal neurological deficits. Extremities: No C/C/E, +pedal pulses Skin: No petechiae. Psychiatry: Judgement and insight appear normal. Mood & affect appropriate.     Data Reviewed: I have personally reviewed following labs and imaging studies  Basic Metabolic Panel: Recent Labs  Lab 09/19/22 0500 09/20/22 0412 09/21/22 0409 09/24/22 0404  NA 138 142 140  --   K 3.2* 3.8 3.9  --   CL 100 104 107  --   CO2 29 28 26   --   GLUCOSE 80 70 65*  --   BUN 12 12 8   --   CREATININE 0.59 0.67 0.59 0.52  CALCIUM 8.1* 8.1* 7.8*  --   MG 1.6* 2.0 1.6*  --    CBC: Recent Labs  Lab 09/19/22 0500 09/21/22 0409  WBC 6.7 9.6  HGB 10.1* 9.9*  HCT 31.1* 31.3*  MCV 85.0 85.1  PLT 443* 448*   Urine analysis:    Component Value Date/Time   COLORURINE YELLOW 09/17/2022 1200   APPEARANCEUR CLEAR 09/17/2022 1200   LABSPEC 1.023 09/17/2022 1200   PHURINE 5.0 09/17/2022 1200   GLUCOSEU NEGATIVE 09/17/2022 1200   HGBUR NEGATIVE 09/17/2022 1200   HGBUR negative 08/10/2009 1013   BILIRUBINUR NEGATIVE 09/17/2022 1200   KETONESUR NEGATIVE 09/17/2022 1200   PROTEINUR 30 (A) 09/17/2022 1200   UROBILINOGEN 0.2 10/17/2010 1249   NITRITE NEGATIVE 09/17/2022 1200   LEUKOCYTESUR NEGATIVE 09/17/2022 1200   Scheduled Meds:  enoxaparin (LOVENOX) injection  40 mg Subcutaneous Q24H   methylPREDNISolone (SOLU-MEDROL) injection  60 mg Intravenous Daily   Continuous Infusions:  0.9 % NaCl with KCl 40 mEq / L 100 mL/hr at 09/24/22 1836   Procedures/Studies: DG Abd 1 View  Result Date: 09/18/2022 CLINICAL DATA:  Check gastric catheter placement EXAM: ABDOMEN - 1 VIEW COMPARISON:  None Available. FINDINGS: Gastric catheter extends into the stomach. The proximal side port now lies within the gastric lumen. Lungs are hypoinflated but clear. No bony abnormality is noted. IMPRESSION: Gastric catheter in satisfactory position.  Electronically Signed   By: Alcide Clever M.D.   On: 09/18/2022 00:50   DG Abd 1 View  Result Date: 09/17/2022 CLINICAL DATA:  Check gastric catheter placement EXAM: ABDOMEN - 1 VIEW COMPARISON:  None Available. FINDINGS: Gastric catheter is noted with the tip in the stomach. Proximal side port lies at the gastroesophageal junction. This should be advanced deeper into the stomach. Lungs are clear but hypoinflated. No free air is noted in the abdomen. IMPRESSION: Gastric catheter as described. This should be advanced deeper into the stomach. Electronically Signed   By: Alcide Clever M.D.   On: 09/17/2022 21:52   DG Abd Portable 1V  Result Date: 09/17/2022 CLINICAL DATA:  NG tube placement. EXAM: PORTABLE ABDOMEN - 1 VIEW COMPARISON:  October 28, 2017 FINDINGS: Enteric catheter terminates at the expected location of gastric body, side hole at the level of the GE junction. Partially visualized distension of small bowels. Elevation of the left hemidiaphragm. IMPRESSION: Enteric catheter terminates at the expected location of gastric body, side hole at the level of the GE junction. Advancement of the enteric catheter is recommended. Electronically Signed   By: Ted Mcalpine M.D.   On: 09/17/2022  19:06   CT ABDOMEN PELVIS W CONTRAST  Result Date: 09/17/2022 CLINICAL DATA:  Abdominal pain.  History of Crohn's disease. EXAM: CT ABDOMEN AND PELVIS WITH CONTRAST TECHNIQUE: Multidetector CT imaging of the abdomen and pelvis was performed using the standard protocol following bolus administration of intravenous contrast. RADIATION DOSE REDUCTION: This exam was performed according to the departmental dose-optimization program which includes automated exposure control, adjustment of the mA and/or kV according to patient size and/or use of iterative reconstruction technique. CONTRAST:  OMNIPAQUE IOHEXOL 300 MG/ML  SOLN COMPARISON:  October 28, 2017 FINDINGS: Lower chest: No acute abnormality. Hepatobiliary:  Diffuse intrahepatic biliary ductal dilation. Post cholecystectomy. Pancreas: Unremarkable. No pancreatic ductal dilatation or surrounding inflammatory changes. Spleen: Normal in size without focal abnormality. Adrenals/Urinary Tract: Adrenal glands are unremarkable. Kidneys are normal, without renal calculi, focal lesion, or hydronephrosis. Bladder is unremarkable. 1.5 cm benign right renal cyst. Stomach/Bowel: The stomach is fluid-filled and moderately distended. Diffuse distension of small bowel loops which are also fluid-filled to the level of the distal ileum. Previous resection of the terminal ileum. Marked inflammatory changes and bowel wall thickening at the anastomosis causing a high-grade small bowel obstruction. The small bowel loops upstream of dilated to 5.3 cm. The more distal portion of the ileum demonstrates diffuse mucosal thickening. The colon is decompressed. Vascular/Lymphatic: No significant vascular findings are present. No enlarged abdominal or pelvic lymph nodes. Reproductive: Uterus and bilateral adnexa are unremarkable. Other: No abdominal wall hernia or abnormality. No abdominopelvic ascites. Musculoskeletal: Spondylosis at L5-S1. IMPRESSION: 1. High-grade small bowel obstruction secondary to marked inflammatory changes and bowel wall thickening versus volvulus at the anastomosis of the terminal ileum and right colon. 2. Diffuse mucosal thickening of the more distal portion of the ileum. 3. Diffuse intrahepatic biliary ductal dilation. 4. Post cholecystectomy. 5. Spondylosis at L5-S1. Electronically Signed   By: Ted Mcalpine M.D.   On: 09/17/2022 15:06    Vassie Loll, MD  [facility]  If 7PM-7AM, please contact night-coverage [email or web address] Password [email or web address] 09/25/2022, 10:16 AM   LOS: 8 days

## 2022-09-25 NOTE — Plan of Care (Signed)
  Problem: Education: Goal: Knowledge of General Education information will improve Description: Including pain rating scale, medication(s)/side effects and non-pharmacologic comfort measures Outcome: Adequate for Discharge   Problem: Health Behavior/Discharge Planning: Goal: Ability to manage health-related needs will improve Outcome: Adequate for Discharge   Problem: Clinical Measurements: Goal: Ability to maintain clinical measurements within normal limits will improve Outcome: Adequate for Discharge   Problem: Nutrition: Goal: Adequate nutrition will be maintained Outcome: Progressing

## 2022-09-26 DIAGNOSIS — K50812 Crohn's disease of both small and large intestine with intestinal obstruction: Secondary | ICD-10-CM | POA: Diagnosis not present

## 2022-09-26 DIAGNOSIS — R110 Nausea: Secondary | ICD-10-CM | POA: Diagnosis not present

## 2022-09-26 DIAGNOSIS — F319 Bipolar disorder, unspecified: Secondary | ICD-10-CM | POA: Diagnosis not present

## 2022-09-26 DIAGNOSIS — K56609 Unspecified intestinal obstruction, unspecified as to partial versus complete obstruction: Secondary | ICD-10-CM | POA: Diagnosis not present

## 2022-09-26 DIAGNOSIS — K219 Gastro-esophageal reflux disease without esophagitis: Secondary | ICD-10-CM | POA: Diagnosis not present

## 2022-09-26 MED ORDER — PANTOPRAZOLE SODIUM 40 MG PO TBEC
40.0000 mg | DELAYED_RELEASE_TABLET | Freq: Every day | ORAL | Status: DC
  Administered 2022-09-26 – 2022-09-28 (×3): 40 mg via ORAL
  Filled 2022-09-26 (×3): qty 1

## 2022-09-26 NOTE — Progress Notes (Signed)
PROGRESS NOTE  [name] ZOX:[id number] DOB: 1970-05-07 DOA: 09/17/2022 PCP: Kerri Perches, MD  Brief History:  52 year old female with a history of Crohn's disease on chronic Humira status post ileocolonic resection, GERD, bipolar disorder, OSA presenting with abdominal pain that began on 09/16/2022.  She had associated nausea and vomiting.  She denied fevers, chills, chest pain, shortness of breath.  She states that her abdominal pain was similar to her previous Crohn's exacerbations.  Because of worsening pain and vomiting, she presented for further evaluation and treatment. In the ED, the patient was afebrile and hemodynamically stable with oxygen saturation 90% room air.  WBC 14.1, hemoglobin 12.1, platelets 520,000.  Sodium 137, potassium 3.2, bicarbonate 28, serum creatinine 0.60.  LFTs were unremarkable.  CT of the abdomen and pelvis showed high-grade SBO secondary to marked inflammatory changes with bowel wall thickening at the level of the terminal ileum and right colon anastomosis.  There was diffuse mucosal thickening of the distal ileum. The patient was given a dose of IV Solu-Medrol.  GI and general surgery consulted to assist with management.  Upon further questioning, it was noted the patient had not received the Humira dose since April 2024.  05/10/2021 colonoscopy--noted the opening to the neoterminal ileum was very narrow; unable to pass/intubate with pediatric colonoscope   Assessment/Plan: Crohn's disease exacerbation/Small Bowel Obstruction -Case discussed with gastroenterology and general surgery.  Appreciate assistance and collaboration. -The patient's current obstruction secondary to Crohn's exacerbation with inflammatory changes worsening her established stricture at her anastomosis -Continue to maintain adequate hydration and supportive care. -judious opioids -Vague abdominal discomfort; reports passing gas and moving bowels.  No further vomiting;  expressing intermittent nausea. -Continue steroids (IV Solu-Medrol) while inpatient; NG tube has been removed and will like to see her properly tolerating soft diet for 24 hours prior to discharge. -If tolerated hopefully will be able to go home with extended steroids tapering on 09/27/2022. -Continue to follow clinical response. -Planning to be discharged home once able to tolerate diet.   Hypokalemia/Hypomagnesemia -Magnesium within normal limit -Continue to follow electrolytes and further replete as needed. -Most recent level 3.9.   Bipolar disorder -Restart Effexor and vistaril once able to tolerate p.o.     Family Communication: Husband at bedside.   Consultants:  GI, general surgery   Code Status:  FULL    DVT Prophylaxis:   Union Lovenox     Procedures: As Listed in Progress Note Above   Antibiotics: None   Subjective: No fever, no CP, no SOB, no vomiting. Continue to have intermittent abd pain and nausea. Tolerate soft diet for supper.  Objective: Vitals:   09/25/22 1322 09/25/22 1856 09/25/22 2005 09/26/22 0326  BP: (!) 151/102 (!) 150/69 (!) 142/73 (!) 152/68  Pulse: 87 77 74 74  Resp: 20  18 18   Temp: 98 F (36.7 C)  98.6 F (37 C) 97.9 F (36.6 C)  TempSrc:   Oral Oral  SpO2: 100% 100% 98% 100%  Weight:      Height:        Intake/Output Summary (Last 24 hours) at 09/26/2022 1410 Last data filed at 09/26/2022 0846 Gross per 24 hour  Intake 7390.22 ml  Output --  Net 7390.22 ml   Weight change:   Exam: General exam: Alert, awake, oriented x 3; afebrile, passing gas and with reported BM overnight. Respiratory system: Clear to auscultation. Respiratory effort normal.good saturation on RA. Cardiovascular system:RRR. No  murmurs, rubs, gallops. Gastrointestinal system: Abdomen is nondistended, soft and no guarding and reporting intermittent abd pain. Positive BS. Central nervous system: Alert and oriented. No focal neurological deficits. Extremities: No  cyanosis, no clubbing. Skin: No petechiae. Psychiatry: Judgement and insight appear normal. Mood & affect appropriate.   Data Reviewed: I have personally reviewed following labs and imaging studies  Basic Metabolic Panel: Recent Labs  Lab 09/20/22 0412 09/21/22 0409 09/24/22 0404  NA 142 140  --   K 3.8 3.9  --   CL 104 107  --   CO2 28 26  --   GLUCOSE 70 65*  --   BUN 12 8  --   CREATININE 0.67 0.59 0.52  CALCIUM 8.1* 7.8*  --   MG 2.0 1.6*  --    CBC: Recent Labs  Lab 09/21/22 0409  WBC 9.6  HGB 9.9*  HCT 31.3*  MCV 85.1  PLT 448*   Urine analysis:    Component Value Date/Time   COLORURINE YELLOW 09/17/2022 1200   APPEARANCEUR CLEAR 09/17/2022 1200   LABSPEC 1.023 09/17/2022 1200   PHURINE 5.0 09/17/2022 1200   GLUCOSEU NEGATIVE 09/17/2022 1200   HGBUR NEGATIVE 09/17/2022 1200   HGBUR negative 08/10/2009 1013   BILIRUBINUR NEGATIVE 09/17/2022 1200   KETONESUR NEGATIVE 09/17/2022 1200   PROTEINUR 30 (A) 09/17/2022 1200   UROBILINOGEN 0.2 10/17/2010 1249   NITRITE NEGATIVE 09/17/2022 1200   LEUKOCYTESUR NEGATIVE 09/17/2022 1200   Scheduled Meds:  enoxaparin (LOVENOX) injection  40 mg Subcutaneous Q24H   methylPREDNISolone (SOLU-MEDROL) injection  60 mg Intravenous Daily   pantoprazole  40 mg Oral Daily   Continuous Infusions:  0.9 % NaCl with KCl 40 mEq / L 100 mL/hr at 09/26/22 1027   Procedures/Studies: DG Abd 1 View  Result Date: 09/18/2022 CLINICAL DATA:  Check gastric catheter placement EXAM: ABDOMEN - 1 VIEW COMPARISON:  None Available. FINDINGS: Gastric catheter extends into the stomach. The proximal side port now lies within the gastric lumen. Lungs are hypoinflated but clear. No bony abnormality is noted. IMPRESSION: Gastric catheter in satisfactory position. Electronically Signed   By: Alcide Clever M.D.   On: 09/18/2022 00:50   DG Abd 1 View  Result Date: 09/17/2022 CLINICAL DATA:  Check gastric catheter placement EXAM: ABDOMEN - 1 VIEW  COMPARISON:  None Available. FINDINGS: Gastric catheter is noted with the tip in the stomach. Proximal side port lies at the gastroesophageal junction. This should be advanced deeper into the stomach. Lungs are clear but hypoinflated. No free air is noted in the abdomen. IMPRESSION: Gastric catheter as described. This should be advanced deeper into the stomach. Electronically Signed   By: Alcide Clever M.D.   On: 09/17/2022 21:52   DG Abd Portable 1V  Result Date: 09/17/2022 CLINICAL DATA:  NG tube placement. EXAM: PORTABLE ABDOMEN - 1 VIEW COMPARISON:  October 28, 2017 FINDINGS: Enteric catheter terminates at the expected location of gastric body, side hole at the level of the GE junction. Partially visualized distension of small bowels. Elevation of the left hemidiaphragm. IMPRESSION: Enteric catheter terminates at the expected location of gastric body, side hole at the level of the GE junction. Advancement of the enteric catheter is recommended. Electronically Signed   By: Ted Mcalpine M.D.   On: 09/17/2022 19:06   CT ABDOMEN PELVIS W CONTRAST  Result Date: 09/17/2022 CLINICAL DATA:  Abdominal pain.  History of Crohn's disease. EXAM: CT ABDOMEN AND PELVIS WITH CONTRAST TECHNIQUE: Multidetector CT imaging of the  abdomen and pelvis was performed using the standard protocol following bolus administration of intravenous contrast. RADIATION DOSE REDUCTION: This exam was performed according to the departmental dose-optimization program which includes automated exposure control, adjustment of the mA and/or kV according to patient size and/or use of iterative reconstruction technique. CONTRAST:  OMNIPAQUE IOHEXOL 300 MG/ML  SOLN COMPARISON:  October 28, 2017 FINDINGS: Lower chest: No acute abnormality. Hepatobiliary: Diffuse intrahepatic biliary ductal dilation. Post cholecystectomy. Pancreas: Unremarkable. No pancreatic ductal dilatation or surrounding inflammatory changes. Spleen: Normal in size  without focal abnormality. Adrenals/Urinary Tract: Adrenal glands are unremarkable. Kidneys are normal, without renal calculi, focal lesion, or hydronephrosis. Bladder is unremarkable. 1.5 cm benign right renal cyst. Stomach/Bowel: The stomach is fluid-filled and moderately distended. Diffuse distension of small bowel loops which are also fluid-filled to the level of the distal ileum. Previous resection of the terminal ileum. Marked inflammatory changes and bowel wall thickening at the anastomosis causing a high-grade small bowel obstruction. The small bowel loops upstream of dilated to 5.3 cm. The more distal portion of the ileum demonstrates diffuse mucosal thickening. The colon is decompressed. Vascular/Lymphatic: No significant vascular findings are present. No enlarged abdominal or pelvic lymph nodes. Reproductive: Uterus and bilateral adnexa are unremarkable. Other: No abdominal wall hernia or abnormality. No abdominopelvic ascites. Musculoskeletal: Spondylosis at L5-S1. IMPRESSION: 1. High-grade small bowel obstruction secondary to marked inflammatory changes and bowel wall thickening versus volvulus at the anastomosis of the terminal ileum and right colon. 2. Diffuse mucosal thickening of the more distal portion of the ileum. 3. Diffuse intrahepatic biliary ductal dilation. 4. Post cholecystectomy. 5. Spondylosis at L5-S1. Electronically Signed   By: Ted Mcalpine M.D.   On: 09/17/2022 15:06    Vassie Loll, MD  [facility]  If 7PM-7AM, please contact night-coverage [email or web address] Password [email or web address] 09/26/2022, 2:10 PM   LOS: 9 days

## 2022-09-26 NOTE — Progress Notes (Signed)
Mobility Specialist Progress Note:    09/26/22 1105  Mobility  Activity Ambulated with assistance in hallway  Level of Assistance Contact guard assist, steadying assist  Assistive Device Front wheel walker  Distance Ambulated (ft) 60 ft  Range of Motion/Exercises Active;All extremities  Activity Response Tolerated well  Mobility Referral Yes  $Mobility charge 1 Mobility  Mobility Specialist Start Time (ACUTE ONLY) 1045  Mobility Specialist Stop Time (ACUTE ONLY) 1100  Mobility Specialist Time Calculation (min) (ACUTE ONLY) 15 min   Pt received in bed, agreeable to mobility session. Ambulated in hallway with RW for additional support. Tolerated well, asx throughout. Pt had slow cadence throughout. Returned pt to chair in room, all needs met, call bell in reach.   Feliciana Rossetti Mobility Specialist Please contact via Special educational needs teacher or  Rehab office at [phone]

## 2022-09-26 NOTE — Progress Notes (Signed)
Elon Jester PA-C; Devlin Brink S, CMA; Gatha Mayer, CMA I sent in the treatment order for Stelara infusion. We will need to see how she does over the weekend before we can say when her start date is. She may require surgery which would push it off quite a bit. We will have to do the outpatient maintenance dose approval to

## 2022-09-26 NOTE — Progress Notes (Signed)
Subjective: Mild nausea. No vomiting. Mild abdominal pain, but overall better. Minimal distension. Had a small BM this morning. Tolerated soft diet last night and this morning well. No brbpr or melena.   She wonders if her mild nausea is secondary to GERD. She was taking omeprazole outpatient, but isn't receiving PPI this admission.   Not interested in surgical management if possible. She s encouraged that her bowels are moving and feels she is moving in the right direction.   Objective: Vital signs in last 24 hours: Temp:  [97.9 F (36.6 C)-98.6 F (37 C)] 97.9 F (36.6 C) (07/22 0326) Pulse Rate:  [74-87] 74 (07/22 0326) Resp:  [18-20] 18 (07/22 0326) BP: (142-152)/(68-102) 152/68 (07/22 0326) SpO2:  [98 %-100 %] 100 % (07/22 0326) Last BM Date : 09/24/22 General:   Alert and oriented, pleasant, NAD.  Head:  Normocephalic and atraumatic. Abdomen:  Bowel sounds present but hypoactive, soft, non-tender, non-distended.  No rebound or guarding.  Msk:  Symmetrical without gross deformities. Normal posture. Extremities:  Without edema. Neurologic:  Alert and  oriented x4;  grossly normal neurologically. Skin:  Warm and dry, intact without significant lesions.  Psych: Normal mood and affect.  Intake/Output from previous day: 07/21 0701 - 07/22 0700 In: 600 [P.O.:600] Out: -  Intake/Output this shift: Total I/O In: 7150.2 [I.V.:7150.2] Out: -   Lab Results: No results for input(s): "WBC", "HGB", "HCT", "PLT" in the last 72 hours. BMET Recent Labs    09/24/22 0404  CREATININE 0.52     Assessment: 52 year old female with past medical history of ileocolonic Chron's disease s/p ileocolonic resection in 2022, GERD, PUD, bipolar, and sleep apnea, who was admitted to the hospital after presenting abdominal pain and nausea with findings concerning for small bowel obstruction due to uncontrolled Crohn's in the setting of Humira treatment interruption. Unfortunately, the  patient's insurance did not cover Humira anymore and she could not pay this medication. Has tried Remicade in the past.   Imaging during current hospitalization showed severe inflammation with stricturing disease at the anastomosis in the setting of previous surgery.  She has been started on IV steroids and has slowly improved during the current hospitalization.  Has tolerated soft diet starting last night and seems to be tolerating this well and had a small BM this morning. Reports only mild nausea, but wonders if this is secondary to GERD as she hasn't been receiving PPI this admission.   As she has continued to slowly improve, no need for acute surgical intervention/eval at tertiary surgical center at this time. Recommend continuing soft diet and monitoring over the next 24-48 hours. She can continue to use Zofran PRN. Will need to try limit pain medications as much as possible. If she does well over the next 24-48 hours, consider discharge with long steroid taper with plan for Stelara as outpatient maintenance (insurance benefits verified, this is the medication that will be covered by her insurance).    Plan: Continue soft diet, no lactose.  Continue IV solumedrol 60 mg daily.  Stelara approval process being worked on outpatient, timing on induction dependent on clinical course and discharge timing.  Will favor transition to long oral steroid taper at discharge.  Continue Zofran prn.  Limit pain medications as much as possible. Start Pantoprazole 40 mg daily for history of GERD.  Continue Lovenox 40 mg SQ daily.  If she continues to tolerate soft diet and having bowel movements, could consider discharge in the next 24-48 hours.  LOS: 9 days    09/26/2022, 11:09 AM   Ermalinda Memos, [facility]

## 2022-09-27 ENCOUNTER — Other Ambulatory Visit: Payer: Self-pay

## 2022-09-27 ENCOUNTER — Telehealth: Payer: Self-pay | Admitting: Gastroenterology

## 2022-09-27 DIAGNOSIS — K50812 Crohn's disease of both small and large intestine with intestinal obstruction: Secondary | ICD-10-CM | POA: Diagnosis not present

## 2022-09-27 DIAGNOSIS — K56609 Unspecified intestinal obstruction, unspecified as to partial versus complete obstruction: Secondary | ICD-10-CM | POA: Diagnosis not present

## 2022-09-27 DIAGNOSIS — R1033 Periumbilical pain: Secondary | ICD-10-CM

## 2022-09-27 LAB — BASIC METABOLIC PANEL WITH GFR
Anion gap: 7 (ref 5–15)
BUN: 5 mg/dL — ABNORMAL LOW (ref 6–20)
CO2: 31 mmol/L (ref 22–32)
Calcium: 8.2 mg/dL — ABNORMAL LOW (ref 8.9–10.3)
Chloride: 97 mmol/L — ABNORMAL LOW (ref 98–111)
Creatinine, Ser: 0.71 mg/dL (ref 0.44–1.00)
GFR, Estimated: >60 mL/min
Glucose, Bld: 103 mg/dL — ABNORMAL HIGH (ref 70–99)
Potassium: 4.2 mmol/L (ref 3.5–5.1)
Sodium: 135 mmol/L (ref 135–145)

## 2022-09-27 MED ORDER — HYDROMORPHONE HCL 2 MG PO TABS: 2.0000 mg | ORAL_TABLET | Freq: Three times a day (TID) | ORAL | 0 refills | Status: AC | PRN

## 2022-09-27 MED ORDER — PANTOPRAZOLE SODIUM 40 MG PO TBEC: 40.0000 mg | DELAYED_RELEASE_TABLET | Freq: Every day | ORAL | 1 refills | Status: DC

## 2022-09-27 MED ORDER — DICYCLOMINE HCL 10 MG PO CAPS: 10.0000 mg | ORAL_CAPSULE | Freq: Four times a day (QID) | ORAL | 0 refills | Status: DC | PRN

## 2022-09-27 MED ORDER — PREDNISONE 5 MG PO TABS: ORAL_TABLET | ORAL | 0 refills | Status: DC

## 2022-09-27 MED ORDER — DICYCLOMINE HCL 10 MG PO CAPS
10.0000 mg | ORAL_CAPSULE | Freq: Four times a day (QID) | ORAL | Status: DC | PRN
  Administered 2022-09-27 – 2022-09-28 (×2): 10 mg via ORAL
  Filled 2022-09-27 (×3): qty 1

## 2022-09-27 NOTE — Telephone Encounter (Signed)
[employer] form placed on [name]'s desk

## 2022-09-27 NOTE — Discharge Summary (Signed)
Physician Discharge Summary   Patient: [name] MRN: [id number] DOB: 02/18/1971  Admit date:     09/17/2022  Discharge date: 09/28/22  Discharge Physician: Shon Hale   PCP: Kerri Perches, MD   Recommendations at discharge:  Need to follow soft diet--please drink liquids frequently to avoid dehydration -Please note that there has been changes to your medications -take medications as prescribed Follow-up with gastroenterology as instructed--- the plan is for you to start Stelara infusions/injections soon Follow-up with PCP in 2 weeks-repeat CBC and CMP blood test advised at that visit  Discharge Diagnoses: Principal Problem:   Small bowel obstruction (HCC) Active Problems:   GERD   Bipolar disorder (HCC)   Crohn's disease of both small and large intestine (HCC)   Periumbilical abdominal pain   Chronic bilateral low back pain with sciatica  Hospital Course: 52 year old female with a history of Crohn's disease on chronic Humira status post ileocolonic resection, GERD, bipolar disorder, OSA presenting with abdominal pain that began on 09/16/2022.  She had associated nausea and vomiting.  She denied fevers, chills, chest pain, shortness of breath.  She states that her abdominal pain was similar to her previous Crohn's exacerbations.  Because of worsening pain and vomiting, she presented for further evaluation and treatment. In the ED, the patient was afebrile and hemodynamically stable with oxygen saturation 90% room air.  WBC 14.1, hemoglobin 12.1, platelets 520,000.  Sodium 137, potassium 3.2, bicarbonate 28, serum creatinine 0.60.  LFTs were unremarkable.  CT of the abdomen and pelvis showed high-grade SBO secondary to marked inflammatory changes with bowel wall thickening at the level of the terminal ileum and right colon anastomosis.  There was diffuse mucosal thickening of the distal ileum. The patient was given a dose of IV Solu-Medrol.  GI and general surgery consulted  to assist with management.  Upon further questioning, it was noted the patient had not received the Humira dose since April 2024.  05/10/2021 colonoscopy--noted the opening to the neoterminal ileum was very narrow; unable to pass/intubate with pediatric colonoscope  Assessment and Plan: Crohn's disease exacerbation/Small Bowel Obstruction -Case discussed with gastroenterology and general surgery.  Appreciate assistance and collaboration. -The patient's current obstruction secondary to Crohn's exacerbation with inflammatory changes worsening her established stricture at her anastomosis -Continue to maintain adequate hydration and supportive care. - No further vomiting;  -Tolerating oral intake -Following GI service recommendation will send home on oral and slow prednisone tapering and outpatient follow-up for initiation of biological agent--possibly Stelara -Continue daily PPI.   Hypokalemia/Hypomagnesemia -rePlaced and normalized   Bipolar disorder -Patient son died recently ---she is grieving -c/n Effexor , temazepam and vistaril  -No suicidal ideation or hallucinations.  -Leukocytosis--suspect that this is steroid-induced -Clinically patient is much improved No fever  Or chills  -Repeat CBC as outpatient as advised   Consultants: Gastroenterology service Procedures performed: See below for x-ray reports. Disposition: Home Diet recommendation: Lactose-free soft diet.  DISCHARGE MEDICATION: Allergies as of 09/28/2022       Reactions   Feraheme [ferumoxytol] Anaphylaxis   Peanut-containing Drug Products Anaphylaxis   Pecans and walnuts also   Shellfish Allergy Anaphylaxis   Oxycodone-acetaminophen Itching, Swelling   Tramadol Hcl Itching        Medication List     STOP taking these medications    cyclobenzaprine 10 MG tablet Commonly known as: FLEXERIL       TAKE these medications    acetaminophen 500 MG tablet Commonly known as: TYLENOL Take 1,000 mg  by  mouth every 6 (six) hours as needed for mild pain.   ascorbic acid 500 MG tablet Commonly known as: VITAMIN C Take 500 mg by mouth daily.   calcium-vitamin D 500-200 MG-UNIT tablet Commonly known as: OSCAL WITH D Take 1 tablet by mouth daily.   dicyclomine 10 MG capsule Commonly known as: BENTYL Take 1 capsule (10 mg total) by mouth every 6 (six) hours as needed for spasms (abdominal pain).   Fish Oil 1200 MG Caps Take 1,200 mg by mouth daily.   HYDROmorphone 2 MG tablet Commonly known as: Dilaudid Take 1 tablet (2 mg total) by mouth every 8 (eight) hours as needed for up to 7 days for severe pain.   hydrOXYzine 25 MG capsule Commonly known as: VISTARIL TAKE 1 TO 2 CAPSULES BY MOUTH AT BEDTIME FOR SLEEP   iron polysaccharides 150 MG capsule Commonly known as: NIFEREX TAKE 1 CAPSULE(150 MG) BY MOUTH DAILY What changed: See the new instructions.   multivitamin with minerals Tabs tablet Take 1 tablet by mouth daily.   pantoprazole 40 MG tablet Commonly known as: PROTONIX Take 1 tablet (40 mg total) by mouth daily.   predniSONE 5 MG tablet Commonly known as: DELTASONE Take 8 tablets by mouth daily x 7 days; then 7 tablets by mouth daily x 7 days; then 6 tablet by mouth daily x 7 days; then 5 tablet by mouth daily x 7 days; then 4 tablets by mouth daily x 7 days; then 3 tablet by mouth daily x 7 days; then 2 tablets by mouth x 7 days; then 1 tablet by mouth x 7 days and stop prednisone.   PROBIOTIC PO Take 1 tablet by mouth daily.   temazepam 7.5 MG capsule Commonly known as: Restoril Take 1 capsule (7.5 mg total) by mouth at bedtime.   venlafaxine XR 37.5 MG 24 hr capsule Commonly known as: EFFEXOR-XR TAKE 1 CAPSULE(37.5 MG) BY MOUTH DAILY WITH BREAKFAST What changed: See the new instructions.   VITAMIN B6 PO Take 1 tablet by mouth daily.        Follow-up Information     Kerri Perches, MD. Schedule an appointment as soon as possible for a visit in 2  week(s).   Specialty: Family Medicine Contact information: [address], [address] [place] [address] [phone]                Discharge Exam: Ceasar Mons Weights   09/17/22 1137  Weight: 78 kg   Physical Exam Gen:- Awake Alert, in no acute distress  HEENT:- Jolly.AT, No sclera icterus Neck-Supple Neck,No JVD,.  Lungs-  CTAB , fair air movement bilaterally  CV- S1, S2 normal, RRR Abd-  +ve B.Sounds, Abd Soft, No tenderness,    Extremity/Skin:- No  edema,   good pedal pulses  Psych-affect is appropriate, oriented x3 Neuro-no new focal deficits, no tremors  Condition at discharge: Stable   The results of significant diagnostics from this hospitalization (including imaging, microbiology, ancillary and laboratory) are listed below for reference.   Imaging Studies: DG Abd 1 View  Result Date: 09/18/2022 CLINICAL DATA:  Check gastric catheter placement EXAM: ABDOMEN - 1 VIEW COMPARISON:  None Available. FINDINGS: Gastric catheter extends into the stomach. The proximal side port now lies within the gastric lumen. Lungs are hypoinflated but clear. No bony abnormality is noted. IMPRESSION: Gastric catheter in satisfactory position. Electronically Signed   By: Alcide Clever M.D.   On: 09/18/2022 00:50   DG Abd 1 View  Result Date: 09/17/2022 CLINICAL DATA:  Check gastric catheter placement EXAM: ABDOMEN - 1 VIEW COMPARISON:  None Available. FINDINGS: Gastric catheter is noted with the tip in the stomach. Proximal side port lies at the gastroesophageal junction. This should be advanced deeper into the stomach. Lungs are clear but hypoinflated. No free air is noted in the abdomen. IMPRESSION: Gastric catheter as described. This should be advanced deeper into the stomach. Electronically Signed   By: Alcide Clever M.D.   On: 09/17/2022 21:52   DG Abd Portable 1V  Result Date: 09/17/2022 CLINICAL DATA:  NG tube placement. EXAM: PORTABLE ABDOMEN - 1 VIEW COMPARISON:  October 28, 2017  FINDINGS: Enteric catheter terminates at the expected location of gastric body, side hole at the level of the GE junction. Partially visualized distension of small bowels. Elevation of the left hemidiaphragm. IMPRESSION: Enteric catheter terminates at the expected location of gastric body, side hole at the level of the GE junction. Advancement of the enteric catheter is recommended. Electronically Signed   By: Ted Mcalpine M.D.   On: 09/17/2022 19:06   CT ABDOMEN PELVIS W CONTRAST  Result Date: 09/17/2022 CLINICAL DATA:  Abdominal pain.  History of Crohn's disease. EXAM: CT ABDOMEN AND PELVIS WITH CONTRAST TECHNIQUE: Multidetector CT imaging of the abdomen and pelvis was performed using the standard protocol following bolus administration of intravenous contrast. RADIATION DOSE REDUCTION: This exam was performed according to the departmental dose-optimization program which includes automated exposure control, adjustment of the mA and/or kV according to patient size and/or use of iterative reconstruction technique. CONTRAST:  OMNIPAQUE IOHEXOL 300 MG/ML  SOLN COMPARISON:  October 28, 2017 FINDINGS: Lower chest: No acute abnormality. Hepatobiliary: Diffuse intrahepatic biliary ductal dilation. Post cholecystectomy. Pancreas: Unremarkable. No pancreatic ductal dilatation or surrounding inflammatory changes. Spleen: Normal in size without focal abnormality. Adrenals/Urinary Tract: Adrenal glands are unremarkable. Kidneys are normal, without renal calculi, focal lesion, or hydronephrosis. Bladder is unremarkable. 1.5 cm benign right renal cyst. Stomach/Bowel: The stomach is fluid-filled and moderately distended. Diffuse distension of small bowel loops which are also fluid-filled to the level of the distal ileum. Previous resection of the terminal ileum. Marked inflammatory changes and bowel wall thickening at the anastomosis causing a high-grade small bowel obstruction. The small bowel loops upstream of  dilated to 5.3 cm. The more distal portion of the ileum demonstrates diffuse mucosal thickening. The colon is decompressed. Vascular/Lymphatic: No significant vascular findings are present. No enlarged abdominal or pelvic lymph nodes. Reproductive: Uterus and bilateral adnexa are unremarkable. Other: No abdominal wall hernia or abnormality. No abdominopelvic ascites. Musculoskeletal: Spondylosis at L5-S1. IMPRESSION: 1. High-grade small bowel obstruction secondary to marked inflammatory changes and bowel wall thickening versus volvulus at the anastomosis of the terminal ileum and right colon. 2. Diffuse mucosal thickening of the more distal portion of the ileum. 3. Diffuse intrahepatic biliary ductal dilation. 4. Post cholecystectomy. 5. Spondylosis at L5-S1. Electronically Signed   By: Ted Mcalpine M.D.   On: 09/17/2022 15:06    Microbiology: Results for orders placed or performed in visit on 11/15/10  Pathologist smear review     Status: None   Collection Time: 11/15/10  5:14 PM  Result Value Ref Range Status   Plt Morphology Reviewed by H. Hollice Espy, M.D.  Final    Comment: 11/16/10        Absolute lymphocytosis and neutrophilia with mild left shift in maturation.  Favor reactive process for both. Recommend clinical correlation.   *Note: Due to a  large number of results and/or encounters for the requested time period, some results have not been displayed. A complete set of results can be found in Results Review.    Labs: CBC: Recent Labs  Lab 09/28/22 0337  WBC 18.3*  HGB 10.5*  HCT 32.8*  MCV 85.0  PLT 446*   Basic Metabolic Panel: Recent Labs  Lab 09/24/22 0404 09/27/22 1051  NA  --  135  K  --  4.2  CL  --  97*  CO2  --  31  GLUCOSE  --  103*  BUN  --  5*  CREATININE 0.52 0.71  CALCIUM  --  8.2*   Discharge time spent: greater than 30 minutes.  Signed: Shon Hale, MD [facility] 09/28/2022

## 2022-09-27 NOTE — Telephone Encounter (Signed)
Patient is getting discharged today. Please arrange hospital follow-up with Tana Coast, PA-C.   Do we have any updates on Stelara approval?

## 2022-09-27 NOTE — Telephone Encounter (Signed)
Spoke with Bayard Hugger. He will get the PA started for infusion. She will be contacted most likely late next week since they are moving locations (across street from where currently is). Nothing further needed from my end for infusion. Will need to start PA for outpatient portion

## 2022-09-27 NOTE — Telephone Encounter (Signed)
Waiting to see if pt needs surgery.

## 2022-09-27 NOTE — Progress Notes (Signed)
    Subjective: Feeling fairly well overall. Reports she is tolerating soft diet well. No nausea or vomiting. Feels that the addition of pantoprazole really helped this. Had 1 small BM yesterday, but has been passing a lot of gas. Reports her abdominal pain seems to be primarily in the evening, described as "cramping pain".   Objective: Vital signs in last 24 hours: Temp:  [97.7 F (36.5 C)-98.3 F (36.8 C)] 97.7 F (36.5 C) (07/23 0505) Pulse Rate:  [75-84] 75 (07/23 0505) Resp:  [16-18] 16 (07/23 0505) BP: (139-143)/(78-89) 143/89 (07/23 0505) SpO2:  [96 %-100 %] 99 % (07/23 0505) Last BM Date : 09/26/22 General:   Alert and oriented, pleasant Head:  Normocephalic and atraumatic. Abdomen:  Bowel sounds present, soft,  non-distended. Mild TTP in RLQ, RUQ, minimal TTP in LUQ. No rebound or guarding.  Extremities:  Without edema. Neurologic:  Alert and  oriented x4 Psych:  Normal mood and affect.   Assessment: 52 year old female with past medical history of ileocolonic Chron's disease s/p ileocolonic resection in 2022, GERD, PUD, bipolar, and sleep apnea, who was admitted to the hospital after presenting abdominal pain and nausea with findings concerning for small bowel obstruction due to uncontrolled Crohn's in the setting of Humira treatment interruption. Unfortunately, the patient's insurance did not cover Humira anymore and she could not pay this medication. Has tried Remicade in the past.    Imaging during current hospitalization showed severe inflammation with stricturing disease at the anastomosis in the setting of previous surgery.  She has been started on IV steroids and has slowly improved during the current hospitalization. Has tolerated a soft diet for >24 hours now. Nausea essentially resolved. 1 BM yesterday, but passing a lot of gas. Abdominal pain has improved quite a bit, seems to be more pronounced in the evening, described as cramping.   As she has improved and is  tolerating a soft diet, would be ok to discharge home this afternoon. She will need to be on long steroid taper.  Planning for Stelara as outpatient maintenance (approval process has been started).    Plan: Update BMP. Continue soft diet, no lactose.  Add dicyclomine as needed for abdominal pain. Recommend trying this first for pain management.  Limit pain medications as much as possible. Continue IV solumedrol 60 mg daily while inpatient.  Transition to long steroid taper at discharge (40 mg daily x 1 week, then 35 mg daily x 1 week, continue decreasing by 5 mg per day each week).  Stelara approval process being worked on outpatient. Continue Protonix 40 mg daily.  From GI standpoint, OK to discharge home today. We will arrange follow-up in the clinic.    LOS: 10 days    09/27/2022, 10:31 AM   Ermalinda Memos, PA-C [facility] G[facility]enter[facility]

## 2022-09-27 NOTE — Telephone Encounter (Signed)
Noted  

## 2022-09-27 NOTE — Telephone Encounter (Signed)
Patient is not having surgery. She has improved with IV steroids. She is getting discharged on oral steroids and needs to start Stelara ASAP.

## 2022-09-27 NOTE — Progress Notes (Signed)
Mobility Specialist Progress Note:    09/27/22 1146  Mobility  Activity Ambulated with assistance in hallway  Level of Assistance Modified independent, requires aide device or extra time  Assistive Device Other (Comment) (IV pole)  Distance Ambulated (ft) 80 ft  Range of Motion/Exercises Active;All extremities  Activity Response Tolerated well  Mobility Referral Yes  $Mobility charge 1 Mobility  Mobility Specialist Start Time (ACUTE ONLY) 1130  Mobility Specialist Stop Time (ACUTE ONLY) 1145  Mobility Specialist Time Calculation (min) (ACUTE ONLY) 15 min   Pt received ambulating independently in room with ModI (IV pole), agreeable to ambulation in hallway. Tolerated well, asx throughout. Returned pt to room, all needs met.   Feliciana Rossetti Mobility Specialist Please contact via Special educational needs teacher or  Rehab office at [phone]

## 2022-09-28 ENCOUNTER — Telehealth: Payer: Self-pay

## 2022-09-28 DIAGNOSIS — K56609 Unspecified intestinal obstruction, unspecified as to partial versus complete obstruction: Secondary | ICD-10-CM | POA: Diagnosis not present

## 2022-09-28 LAB — CBC
HCT: 32.8 % — ABNORMAL LOW (ref 36.0–46.0)
Hemoglobin: 10.5 g/dL — ABNORMAL LOW (ref 12.0–15.0)
MCH: 27.2 pg (ref 26.0–34.0)
MCHC: 32 g/dL (ref 30.0–36.0)
MCV: 85 fL (ref 80.0–100.0)
Platelets: 446 10*3/uL — ABNORMAL HIGH (ref 150–400)
RBC: 3.86 MIL/uL — ABNORMAL LOW (ref 3.87–5.11)
RDW: 17 % — ABNORMAL HIGH (ref 11.5–15.5)
WBC: 18.3 10*3/uL — ABNORMAL HIGH (ref 4.0–10.5)
nRBC: 0 % (ref 0.0–0.2)

## 2022-09-28 MED ORDER — OXYCODONE HCL 5 MG PO TABS
5.0000 mg | ORAL_TABLET | ORAL | Status: DC | PRN
  Administered 2022-09-28: 5 mg via ORAL
  Filled 2022-09-28: qty 1

## 2022-09-28 NOTE — Care Management Important Message (Signed)
Important Message  Patient Details  Name: [name] MRN: [id number] Date of Birth: 09-Oct-1970   Medicare Important Message Given:  Yes     Corey Harold 09/28/2022, 10:11 AM

## 2022-09-28 NOTE — Telephone Encounter (Signed)
-----   Message from Tana Coast sent at 09/23/2022 12:31 PM EDT ----- I sent in the treatment order for Stelara infusion. We will need to see how she does over the weekend before we can say when her start date is. She may require surgery which would push it off quite a bit. We will have to do the outpatient maintenance dose approval to

## 2022-09-28 NOTE — Progress Notes (Addendum)
Dr.Courage and Charge RN aware of pt's husband picking her up after work (4pm)ish  All discharge instructions,medications appts reviewed with pt. All questions and concerns addressed. Pt.has all belongings gathered and ready to take home when husband arrives.

## 2022-09-28 NOTE — Plan of Care (Signed)

## 2022-09-28 NOTE — Telephone Encounter (Signed)
See other notes regarding this

## 2022-09-28 NOTE — Telephone Encounter (Signed)
Spoke with Leonette Most from [facility] and was given information on form to fill out. Form has been filled out and placed in your folder to be signed.

## 2022-09-28 NOTE — Telephone Encounter (Signed)
Spoke with Tammy C and made up to date with status.

## 2022-09-28 NOTE — Progress Notes (Signed)
Mobility Specialist Progress Note:    09/28/22 1100  Mobility  Activity Ambulated independently in room  Level of Assistance Independent  Assistive Device None  Distance Ambulated (ft) 20 ft  Range of Motion/Exercises Active;All extremities  Activity Response Tolerated well  Mobility Referral Yes  $Mobility charge 1 Mobility  Mobility Specialist Start Time (ACUTE ONLY) 1100  Mobility Specialist Stop Time (ACUTE ONLY) 1115  Mobility Specialist Time Calculation (min) (ACUTE ONLY) 15 min   Pt received ambulating in room independently, no AD required. Tolerated well, asx throughout. Left pt in room, eager for d/c, all needs met.   Feliciana Rossetti Mobility Specialist Please contact via Special educational needs teacher or  Rehab office at [phone]

## 2022-09-28 NOTE — Consult Note (Signed)
[facility] Customer service manager [facility]) Accountable Care Organization (ACO) [facility] Liaison Note  09/28/2022  [name] 05-02-1970 [id number]  Location: [facility] RN Hospital Liaison screened the patient remotely at [facility].  Insurance: [employer] Advant[employer]   [name] is a 52 y.o. female who is a Primary Care Patient of Kerri Perches, MD [facility]). The patient was screened for readmission hospitalization with noted low risk score for unplanned readmission risk with 1 IP in 6 months.  The patient was assessed for potential [facility]) Care Management service needs for post hospital transition for care coordination. Review of patient's electronic medical record reveals patient was admitted for Small Bowel obstruction. No needs anticipated at this time.  Plan: [facility] Liaison will continue to follow progress and disposition to asess for post hospital community care coordination/management needs.  Referral request for community care coordination: anticipate [facility] Transitions of Care Team follow up.   [facility] Care Management/Population Health does not replace or interfere with any arrangements made by the Inpatient Transition of Care team.   For questions contact:   Elliot Cousin, RN, [facility] Liaison [place]   Population Health Office Hours MTWF  8:00 am-6:00 pm Off on Thursday [phone] mobile [phone] [Office toll free line] Office Hours are M-F 8:30 - 5 pm 24 hour nurse advise line [phone] [email or web address]  [email or web address]

## 2022-09-28 NOTE — Progress Notes (Addendum)
Dr.Madera sent message yesterday morning to inquire about stopping IV pain medication and transitioning to PO medication only. MD ok with pt.continuing with IV pain medication and PO medication ordered.   Dr.Madera and Dr.Castaneda notified yesterday of pt's concern with being discharged. Pt.was under impression that she was staying another night for pain control and to transition to PO pain medication.  [email or web address] through out shift yesterday on importance of utilizing PO pain medication due to pending discharge. Pt.verbalizes understanding and was given PO pain medication ordered. At end of shift yesterday pt.informs RN that her pain control is not lasting with PO medication, however agrees to try and wait until next dose is due.   Per report from previous shift pt.was given IV pain medication and no doses of PO ordered.  See flowsheet for specifics  Dr.Courage sent message that pt.is requiring IV pain medication around the clock and has not been given any PO.  RN inquiring if MD wants to stop IV pain medication and change or continue PO pain medication ordered.  Also if MD wants to continue IV fluids with Potassium. See labs for specifics.

## 2022-09-29 ENCOUNTER — Telehealth: Payer: Self-pay

## 2022-09-29 ENCOUNTER — Ambulatory Visit: Payer: Medicare HMO | Admitting: Professional Counselor

## 2022-09-29 NOTE — Transitions of Care (Post Inpatient/ED Visit) (Signed)
09/29/2022  Name: [name] MRN: [id number] DOB: [birth year]  Today's TOC FU Call Status: Today's TOC FU Call Status:: Successful TOC FU Call Competed TOC FU Call Complete Date: 09/29/22  Transition Care Management Follow-up Telephone Call Date of Discharge: 09/28/22 Discharge [facility]: [facility] Type of Discharge: Inpatient Admission Primary Inpatient Discharge Diagnosis:: Small Bowel Obstruction How have you been since you were released from the hospital?: Better Any questions or concerns?: No  Items Reviewed: Did you receive and understand the discharge instructions provided?: Yes Medications obtained,verified, and reconciled?: No Any new allergies since your discharge?: No Dietary orders reviewed?: Yes Type of Diet Ordered:: Soft diet Do you have support at home?: Yes People in Home: spouse Name of Support/Comfort Primary Source: Caryn Bee  Medications Reviewed Today: Medications Reviewed Today     Reviewed by Jodelle Gross, RN (Case Manager) on 09/29/22 at 1405  Med List Status: <None>   Medication Order Taking? Sig Documenting Provider Last Dose Status Informant  acetaminophen (TYLENOL) 500 MG tablet [id number] No Take 1,000 mg by mouth every 6 (six) hours as needed for mild pain. [provider] Unknown Active Self, Pharmacy Records           Med Note Electa Sniff, [facility]   Thu Sep 29, 2022  2:04 PM) TOC did not complete medication reconciliation  calcium-vitamin D (OSCAL WITH D) 500-200 MG-UNIT tablet [id number]  Take 1 tablet by mouth daily. [provider]  Active Self, Pharmacy Records           Med Note Tyrell Antonio   Sat Sep 17, 2022  4:41 PM)    dicyclomine (BENTYL) 10 MG capsule [id number]  Take 1 capsule (10 mg total) by mouth every 6 (six) hours as needed for spasms (abdominal pain). Vassie Loll, MD  Active   HYDROmorphone (DILAUDID) 2 MG tablet [id number]  Take 1 tablet (2 mg total) by mouth every 8 (eight) hours as needed for up  to 7 days for severe pain. Vassie Loll, MD  Active   hydrOXYzine (VISTARIL) 25 MG capsule [id number]  TAKE 1 TO 2 CAPSULES BY MOUTH AT BEDTIME FOR SLEEP Kerri Perches, MD  Active Self, Pharmacy Records  iron polysaccharides (NIFEREX) 150 MG capsule [id number]  TAKE 1 CAPSULE(150 MG) BY MOUTH DAILY  Patient taking differently: Take 150 mg by mouth daily.   Kerri Perches, MD  Active Self, Pharmacy Records  Multiple Vitamin (MULTIVITAMIN WITH MINERALS) TABS tablet [id number]  Take 1 tablet by mouth daily. [provider]  Active Self, Pharmacy Records  Omega-3 Fatty Acids (FISH OIL) 1200 MG CAPS [id number]  Take 1,200 mg by mouth daily.  [provider]  Active Self, Pharmacy Records  pantoprazole (PROTONIX) 40 MG tablet [id number]  Take 1 tablet (40 mg total) by mouth daily. Vassie Loll, MD  Active   predniSONE (DELTASONE) 5 MG tablet [id number]  Take 8 tablets by mouth daily x 7 days; then 7 tablets by mouth daily x 7 days; then 6 tablet by mouth daily x 7 days; then 5 tablet by mouth daily x 7 days; then 4 tablets by mouth daily x 7 days; then 3 tablet by mouth daily x 7 days; then 2 tablets by mouth x 7 days; then 1 tablet by mouth x 7 days and stop prednisone. Vassie Loll, MD  Active   Probiotic Product (PROBIOTIC PO) [id number]  Take 1 tablet by mouth daily. [provider]  Active Self, Pharmacy Records  Pyridoxine HCl (VITAMIN B6 PO)  [id number]  Take 1 tablet by mouth daily. [provider]  Active Self, Pharmacy Records  temazepam (RESTORIL) 7.5 MG capsule [id number]  Take 1 capsule (7.5 mg total) by mouth at bedtime. Kerri Perches, MD  Active Self, Pharmacy Records  venlafaxine XR (EFFEXOR-XR) 37.5 MG 24 hr capsule [id number]  TAKE 1 CAPSULE(37.5 MG) BY MOUTH DAILY WITH BREAKFAST  Patient taking differently: Take 37.5 mg by mouth daily with breakfast.   Kerri Perches, MD  Active Self, Pharmacy Records  vitamin C (ASCORBIC ACID) 500 MG  tablet [id number]  Take 500 mg by mouth daily. [provider]  Active Self, Pharmacy Records            Home Care and Equipment/Supplies: Were Home Health Services Ordered?: No Any new equipment or medical supplies ordered?: No  Functional Questionnaire: Do you need assistance with bathing/showering or dressing?: No Do you need assistance with meal preparation?: No Do you need assistance with eating?: No Do you have difficulty maintaining continence: No Do you need assistance with getting out of bed/getting out of a chair/moving?: No Do you have difficulty managing or taking your medications?: No  Follow up appointments reviewed: PCP Follow-up appointment confirmed?: Yes Date of PCP follow-up appointment?: 10/04/22 Follow-up Provider: Rica Records, NP Specialist Hospital Follow-up appointment confirmed?: Yes Date of Specialist follow-up appointment?: 10/19/22 Follow-Up Specialty Provider:: Dr. Melvyn Neth Do you need transportation to your follow-up appointment?: No Do you understand care options if your condition(s) worsen?: Yes-patient verbalized understanding  SDOH Interventions Today    Flowsheet Row Most Recent Value  SDOH Interventions   Food Insecurity Interventions Intervention Not Indicated  Housing Interventions Intervention Not Indicated  Transportation Interventions Intervention Not Indicated      TOC Interventions Today    Flowsheet Row Most Recent Value  TOC Interventions   TOC Interventions Discussed/Reviewed TOC Interventions Discussed, TOC Interventions Reviewed, Arranged PCP follow up within 7 days/Care Guide scheduled       Jodelle Gross, RN, BSN, CCM Care Management Coordinator [facility] Phone: [phone]/Fax: [phone]

## 2022-09-29 NOTE — Progress Notes (Signed)
See other telephone notes.  

## 2022-09-29 NOTE — Telephone Encounter (Signed)
Formed was completed and returned to Mingus C

## 2022-09-30 NOTE — Telephone Encounter (Signed)
Spoke with [facility] regarding form and they are currently processing it.

## 2022-10-04 ENCOUNTER — Ambulatory Visit (INDEPENDENT_AMBULATORY_CARE_PROVIDER_SITE_OTHER): Payer: Medicare HMO | Admitting: Family Medicine

## 2022-10-04 ENCOUNTER — Encounter: Payer: Self-pay | Admitting: Family Medicine

## 2022-10-04 VITALS — BP 137/79 | HR 106 | Ht 67.0 in | Wt 178.0 lb

## 2022-10-04 DIAGNOSIS — Z8719 Personal history of other diseases of the digestive system: Secondary | ICD-10-CM | POA: Diagnosis not present

## 2022-10-04 DIAGNOSIS — K56609 Unspecified intestinal obstruction, unspecified as to partial versus complete obstruction: Secondary | ICD-10-CM

## 2022-10-04 NOTE — Assessment & Plan Note (Addendum)
Hospital Follow up, CBC and BMP ordered.  Patient reports slight abdominal pain but has gotten better since discharge, takes Bentyl 10 mg with moderate relief. Advise to eat small, Frequent Meals: Eat smaller portions more frequently throughout the day to reduce the burden on the digestive system. Following up with GI on 8/14
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
## 2022-10-04 NOTE — Patient Instructions (Signed)

## 2022-10-05 ENCOUNTER — Encounter: Payer: Self-pay | Admitting: Family Medicine

## 2022-10-05 ENCOUNTER — Other Ambulatory Visit: Payer: Self-pay | Admitting: Family Medicine

## 2022-10-05 MED ORDER — ONDANSETRON HCL 4 MG PO TABS: 4.0000 mg | ORAL_TABLET | Freq: Four times a day (QID) | ORAL | 1 refills | Status: DC | PRN

## 2022-10-05 MED ORDER — DICYCLOMINE HCL 10 MG PO CAPS: 10.0000 mg | ORAL_CAPSULE | Freq: Four times a day (QID) | ORAL | 0 refills | Status: DC | PRN

## 2022-10-06 ENCOUNTER — Ambulatory Visit (INDEPENDENT_AMBULATORY_CARE_PROVIDER_SITE_OTHER): Payer: Medicare HMO | Admitting: Professional Counselor

## 2022-10-06 DIAGNOSIS — F331 Major depressive disorder, recurrent, moderate: Secondary | ICD-10-CM

## 2022-10-06 NOTE — BH Specialist Note (Signed)
[facility] Telephone Follow-up  MRN: [id number] NAME: [name] Date: 10/06/22  Start time: Start Time: 1030 End time: Stop Time: 1100 Total time: Total Time in Minutes (Visit): 30 Call number: Visit Number: 5-Fifth Visit  Reason for call today:  The patient is a 52 year old female presenting for a collaborative care follow-up. Recently, she experienced a Crohn's disease flare-up, leading to hospitalization for over a week due to an obstruction that required extraction through a tube. She found the hospitalization very stressful but has since regained stability and was discharged feeling much better this week. The patient reports a conflict with her daughter over differing views on grief and closure following the death of her son. While the patient feels the need for a memorial to achieve closure and begin healing, her daughter is not ready for this step. Her older daughter has been mediating their communication, which has improved since an argument. The behavioral counselor provided recommendations on how to approach discussions with her daughter and emphasized understanding the different stages of grief, which the patient found helpful.  Despite improved family communication, the patient reports ongoing difficulty sleeping and mild symptoms of depression and anxiety, although her scores are relatively low. She missed her psychiatric referral due to her hospitalization, so the behavioral counselor will place a new order to reconnect her with psychiatric services. The patient feels stable and believes her previous bipolar diagnosis no longer reflects her current symptoms, denying any episodes of mania. Her primary concern remains processing grief for her son and finding ways to honor him through a [employer]. She continues to function well, maintaining her work and daily responsibilities. The behavioral counselor provided additional education about grief, which she can share with her  daughter to support their collective healing process. A follow-up appointment is scheduled in two weeks.  PHQ-9 Scores:     10/04/2022    8:43 AM 09/15/2022    2:49 PM 09/15/2022    2:35 PM 08/26/2022   10:36 AM 08/16/2022    9:45 AM  Depression screen PHQ 2/9  Decreased Interest 1 1 1 1  0  Down, Depressed, Hopeless 0 1 1 1  0  PHQ - 2 Score 1 2 2 2  0  Altered sleeping 2 2 2 2  0  Tired, decreased energy 0 2 2 2  0  Change in appetite 0 1 1 0 0  Feeling bad or failure about yourself  1 0 0 0 0  Trouble concentrating 0 0 0 0 0  Moving slowly or fidgety/restless 0 0 0 0 0  Suicidal thoughts 0 0 0 0 0  PHQ-9 Score 4 7 7 6  0  Difficult doing work/chores Not difficult at all Somewhat difficult Somewhat difficult Not difficult at all    GAD-7 Scores:     10/04/2022    8:44 AM 09/15/2022    2:50 PM 08/26/2022   10:38 AM 08/12/2022    9:27 AM  GAD 7 : Generalized Anxiety Score  Nervous, Anxious, on Edge 0 1 0 0  Control/stop worrying 0 0 1 1  Worry too much - different things 1 0 1 1  Trouble relaxing 0 1 1 1   Restless 0 0 0 0  Easily annoyed or irritable 1 0 1 2  Afraid - awful might happen 0 0 0 0  Total GAD 7 Score 2 2 4 5   Anxiety Difficulty Not difficult at all Somewhat difficult Not difficult at all Somewhat difficult    Stress Current stressors:  Ongoing  grief Sleep:  Disrupted Appetite:  Good Coping ability:  Good Patient taking medications as prescribed:  Yes  Current medications:  Outpatient Encounter Medications as of 10/06/2022  Medication Sig   acetaminophen (TYLENOL) 500 MG tablet Take 1,000 mg by mouth every 6 (six) hours as needed for mild pain.   calcium-vitamin D (OSCAL WITH D) 500-200 MG-UNIT tablet Take 1 tablet by mouth daily.   dicyclomine (BENTYL) 10 MG capsule Take 1 capsule (10 mg total) by mouth every 6 (six) hours as needed for spasms (abdominal pain).   hydrOXYzine (VISTARIL) 25 MG capsule TAKE 1 TO 2 CAPSULES BY MOUTH AT BEDTIME FOR SLEEP   iron  polysaccharides (NIFEREX) 150 MG capsule TAKE 1 CAPSULE(150 MG) BY MOUTH DAILY (Patient taking differently: Take 150 mg by mouth daily.)   Multiple Vitamin (MULTIVITAMIN WITH MINERALS) TABS tablet Take 1 tablet by mouth daily.   Omega-3 Fatty Acids (FISH OIL) 1200 MG CAPS Take 1,200 mg by mouth daily.    ondansetron (ZOFRAN) 4 MG tablet Take 1 tablet (4 mg total) by mouth every 6 (six) hours as needed for nausea or vomiting.   pantoprazole (PROTONIX) 40 MG tablet Take 1 tablet (40 mg total) by mouth daily.   predniSONE (DELTASONE) 5 MG tablet Take 8 tablets by mouth daily x 7 days; then 7 tablets by mouth daily x 7 days; then 6 tablet by mouth daily x 7 days; then 5 tablet by mouth daily x 7 days; then 4 tablets by mouth daily x 7 days; then 3 tablet by mouth daily x 7 days; then 2 tablets by mouth x 7 days; then 1 tablet by mouth x 7 days and stop prednisone.   Probiotic Product (PROBIOTIC PO) Take 1 tablet by mouth daily.   Pyridoxine HCl (VITAMIN B6 PO) Take 1 tablet by mouth daily.   temazepam (RESTORIL) 7.5 MG capsule Take 1 capsule (7.5 mg total) by mouth at bedtime.   venlafaxine XR (EFFEXOR-XR) 37.5 MG 24 hr capsule TAKE 1 CAPSULE(37.5 MG) BY MOUTH DAILY WITH BREAKFAST (Patient taking differently: Take 37.5 mg by mouth daily with breakfast.)   vitamin C (ASCORBIC ACID) 500 MG tablet Take 500 mg by mouth daily.   No facility-administered encounter medications on file as of 10/06/2022.     Self-harm Behaviors Risk Assessment Self-harm risk factors:  Previous attempts Patient endorses recent thoughts of harming self:  Denies   Danger to Others Risk Assessment Danger to others risk factors:  None Patient endorses recent thoughts of harming others:  Denies    Goals, Interventions and Follow-up Plan Goals: Increase healthy adjustment to current life circumstances Interventions: Solution-Focused Strategies, Mindfulness or Relaxation Training, and CBT Cognitive Behavioral Therapy Follow-up  Plan: Follow up two weeks   Reuel Boom

## 2022-10-07 NOTE — Progress Notes (Signed)
Patient hospitalized at time of appointment in Saturday clinic.

## 2022-10-10 ENCOUNTER — Other Ambulatory Visit: Payer: Self-pay | Admitting: Family Medicine

## 2022-10-10 ENCOUNTER — Encounter: Payer: Self-pay | Admitting: Family Medicine

## 2022-10-11 ENCOUNTER — Other Ambulatory Visit: Payer: Self-pay

## 2022-10-11 ENCOUNTER — Telehealth: Payer: Self-pay | Admitting: Pharmacy Technician

## 2022-10-11 ENCOUNTER — Encounter: Payer: Self-pay | Admitting: Family Medicine

## 2022-10-11 NOTE — Telephone Encounter (Signed)
FYI, Eye is better once patient used her dry eye drops and now having spasming in feet and hands, patient will way this out until next week at her appointment

## 2022-10-11 NOTE — Telephone Encounter (Signed)
Called patient, patient used dry eye drops eye is better today

## 2022-10-11 NOTE — Telephone Encounter (Signed)
Auth Submission: APPROVED Site of care: Site of care: [facility] Payer: [employer] Medication & CPT/J Code(s) submitted: [facility] (Ustekinumab) J3358 Route of submission (phone, fax, portal): PORTAL Phone # Fax # Auth type: Buy/Bill PB Units/visits requested: 390MG  X1 Reference number:  Approval from:  to

## 2022-10-12 ENCOUNTER — Ambulatory Visit
Admission: RE | Admit: 2022-10-12 | Discharge: 2022-10-12 | Disposition: A | Discharge status: Home / Self Care | Payer: Medicare HMO | Source: Ambulatory Visit

## 2022-10-12 VITALS — BP 118/90 | HR 116 | Temp 98.3°F | Resp 16

## 2022-10-12 DIAGNOSIS — M62838 Other muscle spasm: Secondary | ICD-10-CM | POA: Diagnosis not present
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
## 2022-10-12 NOTE — ED Triage Notes (Signed)
Patient to Urgent Care with complaints of spasms and pain in both her legs and feet that started yesterday. Report she also fell two days ago.  Reports she recently was prescribed bentyl, expresses concerns that this could be a side effect.

## 2022-10-12 NOTE — Discharge Instructions (Signed)
Today your evaluated for muscle spasms  Have obtained blood work to check your electrolytes, you will be notified of any concerning value and told how to move forward  Muscle relaxants have been sent in to be used as needed, may take every 8 hours as needed, be mindful these can make you sleepy  Whether or not magnesium level is low research has shown that supplements have been helpful in managing muscle aches and spasms, this may also be attempted for comfort  You may continue to elevate legs as has been helpful in managing discomfort  Low suspicion that because of spasming is related to use of dicyclomine as this is not a common adverse reaction  You may attempt use of warm to cool compresses over the affected area  If you continue to have symptoms you may follow-up with your primary doctor for further evaluation as needed

## 2022-10-13 ENCOUNTER — Encounter: Payer: Self-pay | Admitting: Internal Medicine

## 2022-10-13 ENCOUNTER — Other Ambulatory Visit: Payer: Self-pay

## 2022-10-13 ENCOUNTER — Ambulatory Visit (INDEPENDENT_AMBULATORY_CARE_PROVIDER_SITE_OTHER): Payer: Medicare HMO | Admitting: Internal Medicine

## 2022-10-13 VITALS — BP 132/86 | HR 109 | Ht 67.5 in | Wt 174.0 lb

## 2022-10-13 DIAGNOSIS — R252 Cramp and spasm: Secondary | ICD-10-CM | POA: Diagnosis not present

## 2022-10-13 NOTE — Telephone Encounter (Signed)
FYI: [name]  °

## 2022-10-13 NOTE — Assessment & Plan Note (Signed)
Could be due to electrolyte imbalance Advised to take magnesium oxide 400 mg daily for now Can try tonic water for muscle cramps She is on temazepam for insomnia, can also help with muscle cramps If persistent, can try Flexeril Needs to continue iron supplement considering her history of IDA in the setting of crohn's disease
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
## 2022-10-13 NOTE — Patient Instructions (Signed)
Please take Magnesium 400 mg once daily for 1 week and then 200 mg once daily.  Okay to try Mustard or tonic water for leg cramps.  Please continue taking other medications as prescribed.

## 2022-10-17 ENCOUNTER — Encounter: Payer: Medicare HMO | Attending: Gastroenterology | Admitting: Emergency Medicine

## 2022-10-17 VITALS — BP 131/86 | HR 100 | Temp 99.1°F | Resp 18 | Wt 182.0 lb

## 2022-10-17 DIAGNOSIS — K50019 Crohn's disease of small intestine with unspecified complications: Secondary | ICD-10-CM | POA: Diagnosis not present

## 2022-10-17 MED ORDER — USTEKINUMAB 130 MG/26ML IV SOLN
390.0000 mg | Freq: Once | INTRAVENOUS | Status: AC
  Administered 2022-10-17: 390 mg via INTRAVENOUS
  Filled 2022-10-17: qty 78

## 2022-10-17 NOTE — Progress Notes (Signed)
Diagnosis: Crohn's Disease  Provider:  Tana Coast PA  Procedure: IV Infusion  IV Type: Peripheral, IV Location: L Hand  Stelera (Ustekinumab), Dose: 390 mg  Infusion Start Time: 0920  Infusion Stop Time: 1035  Post Infusion IV Care: Observation period completed  Discharge: Condition: Good, Destination: Home . AVS Provided  Performed by:  Arrie Senate, RN

## 2022-10-18 ENCOUNTER — Telehealth: Payer: Self-pay

## 2022-10-18 ENCOUNTER — Encounter: Payer: Self-pay | Admitting: Family Medicine

## 2022-10-18 NOTE — Telephone Encounter (Signed)
Patient assistance for [name] has been approved. Approval letter and documents will be scanned into patient's chart.

## 2022-10-18 NOTE — Telephone Encounter (Signed)
Need anything from me, RX?

## 2022-10-18 NOTE — Progress Notes (Signed)
I have reviewed and agree with the above documentation. Milus Mallick. Lodema Hong, MD [facility] Location Provider: office Location Patient: home

## 2022-10-19 ENCOUNTER — Ambulatory Visit (INDEPENDENT_AMBULATORY_CARE_PROVIDER_SITE_OTHER): Payer: Medicare HMO | Admitting: Gastroenterology

## 2022-10-19 ENCOUNTER — Other Ambulatory Visit: Payer: Self-pay

## 2022-10-19 ENCOUNTER — Encounter: Payer: Self-pay | Admitting: Gastroenterology

## 2022-10-19 VITALS — BP 134/81 | HR 96 | Temp 98.9°F | Ht 67.5 in | Wt 184.0 lb

## 2022-10-19 DIAGNOSIS — D509 Iron deficiency anemia, unspecified: Secondary | ICD-10-CM | POA: Diagnosis not present

## 2022-10-19 DIAGNOSIS — K219 Gastro-esophageal reflux disease without esophagitis: Secondary | ICD-10-CM | POA: Diagnosis not present

## 2022-10-19 DIAGNOSIS — K50019 Crohn's disease of small intestine with unspecified complications: Secondary | ICD-10-CM | POA: Diagnosis not present

## 2022-10-19 DIAGNOSIS — K5080 Crohn's disease of both small and large intestine without complications: Secondary | ICD-10-CM

## 2022-10-19 MED ORDER — POLYETHYLENE GLYCOL 3350 17 GM/SCOOP PO POWD: ORAL | Status: DC

## 2022-10-19 NOTE — Patient Instructions (Addendum)
Continue your prednisone taper by 5mg  each week until you are done. Your Stelara injection will start 8 weeks after your infusion was given. You will take an injection every 8 weeks.  Take miralax two capfuls in 12 ounces of liquid every day until stools are soft and easy to pass, then continue one capful daily as needed.  Please update labs in 01/2023, we will send you a reminder.  Return office visit in 01/2023.   It was a pleasure to see you today.  I truly value your feedback, so please be on the lookout for a survey regarding your visit with me today. I appreciate your time in completing this!

## 2022-10-19 NOTE — Telephone Encounter (Signed)
No ma'am Rx was part of the original form that you signed. Pt is good to go!

## 2022-10-19 NOTE — Progress Notes (Signed)
GI Office Note    Referring Provider: Kerri Perches, MD Primary Care Physician:  Kerri Perches, MD  Primary Gastroenterologist: Roetta Sessions, MD   Chief Complaint   Chief Complaint  Patient presents with   Follow-up    Doing well, having issues with constipation    History of Present Illness   [name] is a 52 y.o. female presenting today for hospital follow up. History of GERD, Ileocolonic Crohn's disease. Crohn's diagnosed in 2002. Course complicated by resection of TI with right hemicolectomy. She had been on Humira since 2019 when she presented with partial small bowel obstruction with diffuse wall thickening of the distal small bowel. She required weekly Humira dosing due to her low drug level previously. No history of antibodies. Previously did not tolerate Remicade due to vomiting. She also has a history of IDA followed by hematology. She has a history of remote H. pylori gastritis in 2009, confirmed eradication by stool antigen 06/2022. She has history of positive Hep B Core total Ab, Hep B surfact Ab reactive, Hep B surface Ag negative. HBV DNA negative. History of TB gold test positive on two previous occasions throughout the years, requiring chest xray which were negative.   Seen in the hospital last month when she presenting with abdominal pain and nasea with findings concerning for small bowel obsturciton due to uncontrolled Crohn's in the setting of Humira treatment interruption. Unfortunately, the patient's insurance did not cover Humira anymore and she could not get her medication.  CT A/P with contrast 09/2022: IMPRESSION: 1. High-grade small bowel obstruction secondary to marked inflammatory changes and bowel wall thickening versus volvulus at the anastomosis of the terminal ileum and right colon. 2. Diffuse mucosal thickening of the more distal portion of the ileum. 3. Diffuse intrahepatic biliary ductal dilation. 4. Post cholecystectomy. 5.  Spondylosis at L5-S1.  She wanted to transition to a different medication because Humira was no longer covered. She received induction infusion of Stelera on 10/17/22. She remains on prednisone for gap coverage. Long steroid taper at discharge (40 mg daily x 1 week, then 35 mg daily x 1 week, continue decreasing by 5 mg per day each week).  She is feeling better. She reports that she is having to strain to get BM to pass. Stools don't look hard. Rectal pain lingers after BM. Eating soft foods. Takes bentyl usually only at night. Takes Zofran at times but not regularly. She has been having some muscle cramps, seen in urgent care and started on magnesium 400mg  daily. Electrolyte levels were ok. Sees PCP Friday for menopausal symptoms. Heartburn doing ok.   She completed colonoscopy March 2023.  She had evidence of prior surgical resection of the terminal ileum/cecum/right colon.  Proximal colon with suspected surgical changes, small nodule and adjacent small ulcer biopsied, possibly contributing to IDA.  Suspect postop finding.  Pathology showed active inflammation but no tumor.  Although this could be in part related to active inflammatory bowel disease, it was felt like NSAID use likely more the culprit here.  Repeat colonoscopy in 5 years recommended.   FINAL MICROSCOPIC DIAGNOSIS:   A. COLON, ULCER AT ANASTOMOSIS, BIOPSY:  - Active chronic colitis with localized ulceration.  Granulomas not  identified.  No dysplasia or malignancy.   B. COLON, NODULE AT ANASTOMOSIS, BIOPSY:  - Active chronic colitis.  No dysplasia or malignancy.  - Focal changes suggestive of granuloma.   Vaccines:  Shingles 2023 No prior PNA vaccines Hep B  immune   Medications   Current Outpatient Medications  Medication Sig Dispense Refill   acetaminophen (TYLENOL) 500 MG tablet Take 1,000 mg by mouth every 6 (six) hours as needed for mild pain.     calcium-vitamin D (OSCAL WITH D) 500-200 MG-UNIT tablet Take 1 tablet  by mouth daily.     dicyclomine (BENTYL) 10 MG capsule Take 1 capsule (10 mg total) by mouth every 6 (six) hours as needed for spasms (abdominal pain). 60 capsule 0   hydrOXYzine (VISTARIL) 25 MG capsule TAKE 1 TO 2 CAPSULES BY MOUTH AT BEDTIME FOR SLEEP 180 capsule 0   iron polysaccharides (NIFEREX) 150 MG capsule TAKE 1 CAPSULE(150 MG) BY MOUTH DAILY (Patient taking differently: Take 150 mg by mouth daily.) 30 capsule 6   magnesium oxide (MAG-OX) 400 (240 Mg) MG tablet Take 400 mg by mouth daily.     Multiple Vitamin (MULTIVITAMIN WITH MINERALS) TABS tablet Take 1 tablet by mouth daily.     Omega-3 Fatty Acids (FISH OIL) 1200 MG CAPS Take 1,200 mg by mouth daily.      ondansetron (ZOFRAN) 4 MG tablet Take 1 tablet (4 mg total) by mouth every 6 (six) hours as needed for nausea or vomiting. 30 tablet 1   pantoprazole (PROTONIX) 40 MG tablet Take 1 tablet (40 mg total) by mouth daily. 30 tablet 1   predniSONE (DELTASONE) 5 MG tablet Take 8 tablets by mouth daily x 7 days; then 7 tablets by mouth daily x 7 days; then 6 tablet by mouth daily x 7 days; then 5 tablet by mouth daily x 7 days; then 4 tablets by mouth daily x 7 days; then 3 tablet by mouth daily x 7 days; then 2 tablets by mouth x 7 days; then 1 tablet by mouth x 7 days and stop prednisone. 252 tablet 0   Probiotic Product (PROBIOTIC PO) Take 1 tablet by mouth daily.     Pyridoxine HCl (VITAMIN B6 PO) Take 1 tablet by mouth daily.     Ustekinumab (STELARA ) Inject 90 mg into the skin as directed. Every 8 weeks     venlafaxine XR (EFFEXOR-XR) 37.5 MG 24 hr capsule TAKE 1 CAPSULE(37.5 MG) BY MOUTH DAILY WITH BREAKFAST (Patient taking differently: Take 37.5 mg by mouth daily with breakfast.) 30 capsule 3   vitamin C (ASCORBIC ACID) 500 MG tablet Take 500 mg by mouth daily.     Vitamin D, Ergocalciferol, (DRISDOL) 1.25 MG (50000 UNIT) CAPS capsule Take 50,000 Units by mouth once a week.     No current facility-administered medications for  this visit.    Allergies   Allergies as of 10/19/2022 - Review Complete 10/19/2022  Allergen Reaction Noted   Feraheme [ferumoxytol] Anaphylaxis 10/06/2015   Peanut-containing drug products Anaphylaxis 03/22/2011   Shellfish allergy Anaphylaxis 03/22/2011   Oxycodone-acetaminophen Itching and Swelling 12/29/2005   Tramadol hcl Itching      Review of Systems   General: Negative for anorexia, weight loss, fever, chills, fatigue, weakness. ENT: Negative for hoarseness, difficulty swallowing , nasal congestion. CV: Negative for chest pain, angina, palpitations, dyspnea on exertion, peripheral edema.  Respiratory: Negative for dyspnea at rest, dyspnea on exertion, cough, sputum, wheezing.  GI: See history of present illness. GU:  Negative for dysuria, hematuria, urinary incontinence, urinary frequency, nocturnal urination.  Endo: Negative for unusual weight change.     Physical Exam   BP 134/81 (BP Location: Right Arm, Patient Position: Sitting, Cuff Size: Normal)   Pulse 96  Temp 98.9 F (37.2 C) (Oral)   Ht 5' 7.5" (1.715 m)   Wt 184 lb (83.5 kg)   LMP  (LMP Unknown)   SpO2 99%   BMI 28.39 kg/m    General: Well-nourished, well-developed in no acute distress.  Eyes: No icterus. Mouth: Oropharyngeal mucosa moist and pink   Lungs: Clear to auscultation bilaterally.  Heart: Regular rate and rhythm, no murmurs rubs or gallops.  Abdomen: Bowel sounds are normal, nontender, nondistended, no hepatosplenomegaly or masses,  no abdominal bruits or hernia , no rebound or guarding.  Rectal: not performed  Extremities: No lower extremity edema. No clubbing or deformities. Neuro: Alert and oriented x 4   Skin: Warm and dry, no jaundice.   Psych: Alert and cooperative, normal mood and affect.  Labs   Lab Results  Component Value Date   NA 139 10/12/2022   CL 99 10/12/2022   K 4.4 10/12/2022   CO2 22 10/12/2022   BUN 14 10/12/2022   CREATININE 0.90 10/12/2022   EGFR 77  10/12/2022   CALCIUM 9.4 10/12/2022   ALBUMIN 3.4 (L) 10/04/2022   GLUCOSE 149 (H) 10/12/2022   Lab Results  Component Value Date   WBC 12.4 (H) 10/04/2022   HGB 10.7 (L) 10/04/2022   HCT 33.1 (L) 10/04/2022   MCV 85 10/04/2022   PLT 529 (H) 10/04/2022   Lab Results  Component Value Date   ALT 22 10/04/2022   AST 16 10/04/2022   ALKPHOS 104 10/04/2022   BILITOT <0.2 10/04/2022   Lab Results  Component Value Date   IRON 85 04/08/2022   TIBC 292 04/08/2022   FERRITIN 96 04/08/2022   Lab Results  Component Value Date   VITAMINB12 >2000 (H) 06/13/2022   Lab Results  Component Value Date   FOLATE 8.1 06/13/2022    Imaging Studies   No results found.  Assessment   *Crohn's *Constipation *GERD *IDA  Doing better since recent admission for SBO. Received induction infusion of Stelara this week. Tapering prednisone 5mg  weekly, currently on 30mg  daily. Tolerating soft foods. Less abdominal pain. Having difficult with BMs, feels constipated, more difficult to go but stools are not hard. Some lingering rectal pain after stool.   IDA continues to be followed by hematology with next planned visit in 04/2023. We will update labs in 3 months.    PLAN   Update labs in 2 months.  Continue prednisone taper. Stelara SQ to start 8 weeks after induction infusion give and continued every 9 weeks. She is receiving patient assistance, just approved.  Take two capfuls of miralax in 12 ounces of water daily until soft stool, then continue one capful daily as needed. Return ov in 01/2023.    Leanna Battles. Melvyn Neth, MHS, PA-C [facility]

## 2022-10-20 ENCOUNTER — Ambulatory Visit (INDEPENDENT_AMBULATORY_CARE_PROVIDER_SITE_OTHER): Payer: Medicare HMO | Admitting: Professional Counselor

## 2022-10-20 DIAGNOSIS — F330 Major depressive disorder, recurrent, mild: Secondary | ICD-10-CM | POA: Diagnosis not present

## 2022-10-20 NOTE — BH Specialist Note (Signed)
[facility] Telephone Follow-up  MRN: [id number] NAME: LILAS SPAR Date: 10/20/22  Start time: Start Time: 1000 End time: Stop Time: 1045 Total time: Total Time in Minutes (Visit): 45 Call number: Visit Number: 6-Sixth Visit  Reason for call today:  The patient, a 52 year old female, presented for a collaborative care follow-up. She continues to process her grief surrounding the suicide of her son. This week, the patient reported feeling heavy guilt, expressing that she should have, and perhaps could have, done more to prevent the tragedy. The behavioral counselor provided education on the stages of grief and noted that the patient appears to be working through the bargaining stage. The counselor explained that this is a normal part of the grieving process and offered tips and strategies to help her navigate this stage. The patient is actively utilizing a Facebook grief group for grieving parents, which she finds very helpful. She has been diligent in keeping track of her emotions, reaching out for support, and consistently attending counseling sessions. The behavioral counselor reiterated these accomplishments and acknowledged her efforts. During the session, the patient also discussed some procrastination regarding a potential career change. Upon further exploration, she revealed that she feels a connection to her current job because her son used to work there, and this may be why she has been hesitant to make a change. She appreciated this insight. Overall, the patient is progressing well and continues to work through her grief. A follow-up appointment is scheduled for two weeks.  PHQ-9 Scores:     10/20/2022   11:55 AM 10/13/2022    9:20 AM 10/04/2022    8:43 AM 09/15/2022    2:49 PM 09/15/2022    2:35 PM  Depression screen PHQ 2/9  Decreased Interest 1 1 1 1 1   Down, Depressed, Hopeless 0 0 0 1 1  PHQ - 2 Score 1 1 1 2 2   Altered sleeping 2 2 2 2 2   Tired, decreased energy 1 1 0 2 2   Change in appetite 0 0 0 1 1  Feeling bad or failure about yourself  0 0 1 0 0  Trouble concentrating 0 0 0 0 0  Moving slowly or fidgety/restless 0 0 0 0 0  Suicidal thoughts 0 0 0 0 0  PHQ-9 Score 4 4 4 7 7   Difficult doing work/chores Somewhat difficult Somewhat difficult Not difficult at all Somewhat difficult Somewhat difficult   GAD-7 Scores:     10/20/2022   11:56 AM 10/13/2022    9:20 AM 10/04/2022    8:44 AM 09/15/2022    2:50 PM  GAD 7 : Generalized Anxiety Score  Nervous, Anxious, on Edge 0 0 0 1  Control/stop worrying 1 0 0 0  Worry too much - different things 1 0 1 0  Trouble relaxing 0 1 0 1  Restless 0 0 0 0  Easily annoyed or irritable 0 0 1 0  Afraid - awful might happen 0 0 0 0  Total GAD 7 Score 2 1 2 2   Anxiety Difficulty Not difficult at all Not difficult at all Not difficult at all Somewhat difficult    Stress Current stressors:  Procrastination Sleep:  Fair Appetite:  Good Coping ability:  Good Patient taking medications as prescribed:  Yes  Current medications:  Outpatient Encounter Medications as of 10/20/2022  Medication Sig   acetaminophen (TYLENOL) 500 MG tablet Take 1,000 mg by mouth every 6 (six) hours as needed for mild pain.   calcium-vitamin  D (OSCAL WITH D) 500-200 MG-UNIT tablet Take 1 tablet by mouth daily.   dicyclomine (BENTYL) 10 MG capsule Take 1 capsule (10 mg total) by mouth every 6 (six) hours as needed for spasms (abdominal pain).   hydrOXYzine (VISTARIL) 25 MG capsule TAKE 1 TO 2 CAPSULES BY MOUTH AT BEDTIME FOR SLEEP   iron polysaccharides (NIFEREX) 150 MG capsule TAKE 1 CAPSULE(150 MG) BY MOUTH DAILY (Patient taking differently: Take 150 mg by mouth daily.)   magnesium oxide (MAG-OX) 400 (240 Mg) MG tablet Take 400 mg by mouth daily.   Multiple Vitamin (MULTIVITAMIN WITH MINERALS) TABS tablet Take 1 tablet by mouth daily.   Omega-3 Fatty Acids (FISH OIL) 1200 MG CAPS Take 1,200 mg by mouth daily.    ondansetron (ZOFRAN) 4 MG  tablet Take 1 tablet (4 mg total) by mouth every 6 (six) hours as needed for nausea or vomiting.   pantoprazole (PROTONIX) 40 MG tablet Take 1 tablet (40 mg total) by mouth daily.   polyethylene glycol powder (GLYCOLAX/MIRALAX) 17 GM/SCOOP powder Take two capfuls in 12 ounces of liquid daily until soft stools that are easy to pass, then continue one capful daily as needed.   predniSONE (DELTASONE) 5 MG tablet Take 8 tablets by mouth daily x 7 days; then 7 tablets by mouth daily x 7 days; then 6 tablet by mouth daily x 7 days; then 5 tablet by mouth daily x 7 days; then 4 tablets by mouth daily x 7 days; then 3 tablet by mouth daily x 7 days; then 2 tablets by mouth x 7 days; then 1 tablet by mouth x 7 days and stop prednisone.   Probiotic Product (PROBIOTIC PO) Take 1 tablet by mouth daily.   Pyridoxine HCl (VITAMIN B6 PO) Take 1 tablet by mouth daily.   Ustekinumab (STELARA [place]) Inject 90 mg into the skin as directed. Every 8 weeks   venlafaxine XR (EFFEXOR-XR) 37.5 MG 24 hr capsule TAKE 1 CAPSULE(37.5 MG) BY MOUTH DAILY WITH BREAKFAST (Patient taking differently: Take 37.5 mg by mouth daily with breakfast.)   vitamin C (ASCORBIC ACID) 500 MG tablet Take 500 mg by mouth daily.   Vitamin D, Ergocalciferol, (DRISDOL) 1.25 MG (50000 UNIT) CAPS capsule Take 50,000 Units by mouth once a week.   No facility-administered encounter medications on file as of 10/20/2022.     Self-harm Behaviors Risk Assessment Self-harm risk factors:  Low Patient endorses recent thoughts of harming self:  Denies   Danger to Others Risk Assessment Danger to others risk factors:  None Patient endorses recent thoughts of harming others:  Denies    Goals, Interventions and Follow-up Plan Goals:  Continue to process grief and learn coping skills to deal with anxiety and depression related to grief process. Interventions: Mindfulness or Relaxation Training and Behavioral Activation Follow-up Plan:  2 week follow  up    Reuel Boom

## 2022-10-21 ENCOUNTER — Encounter (HOSPITAL_COMMUNITY): Payer: Self-pay | Admitting: Hematology

## 2022-10-21 ENCOUNTER — Encounter: Payer: Self-pay | Admitting: Family Medicine

## 2022-10-21 ENCOUNTER — Encounter: Payer: Self-pay | Admitting: Gastroenterology

## 2022-10-21 ENCOUNTER — Ambulatory Visit (INDEPENDENT_AMBULATORY_CARE_PROVIDER_SITE_OTHER): Payer: Medicare HMO | Admitting: Family Medicine

## 2022-10-21 VITALS — BP 124/84 | HR 95 | Ht 67.0 in | Wt 182.0 lb

## 2022-10-21 DIAGNOSIS — F4321 Adjustment disorder with depressed mood: Secondary | ICD-10-CM

## 2022-10-21 DIAGNOSIS — M549 Dorsalgia, unspecified: Secondary | ICD-10-CM | POA: Insufficient documentation

## 2022-10-21 DIAGNOSIS — M5441 Lumbago with sciatica, right side: Secondary | ICD-10-CM

## 2022-10-21 DIAGNOSIS — G8929 Other chronic pain: Secondary | ICD-10-CM

## 2022-10-21 DIAGNOSIS — K56609 Unspecified intestinal obstruction, unspecified as to partial versus complete obstruction: Secondary | ICD-10-CM | POA: Diagnosis not present

## 2022-10-21 DIAGNOSIS — M5442 Lumbago with sciatica, left side: Secondary | ICD-10-CM

## 2022-10-21 MED ORDER — HYDROXYZINE HCL 50 MG PO TABS: ORAL_TABLET | ORAL | 0 refills | Status: DC

## 2022-10-21 NOTE — Assessment & Plan Note (Signed)
Rated at 8 goes to back of knees, refer to PT twice weekly for 6 weeks

## 2022-10-21 NOTE — Patient Instructions (Signed)
F/U in 7 weeks, call if you need me sooner  Send message re sleep medication, dose effectively doubled as of today  You are referred fpro PT for back in [place] for 6 weeks   I have reached out to Psychiatry for an appt to be rescheduled for you  It is important that you exercise regularly at least 30 minutes 5 times a week. If you develop chest pain, have severe difficulty breathing, or feel very tired, stop exercising immediately and seek medical attention  Thanks for choosing [facility], we consider it a privelige to serve you.

## 2022-10-24 ENCOUNTER — Encounter: Payer: Self-pay | Admitting: Family Medicine

## 2022-10-25 ENCOUNTER — Encounter (HOSPITAL_COMMUNITY): Payer: Self-pay | Admitting: Emergency Medicine

## 2022-10-25 ENCOUNTER — Emergency Department (HOSPITAL_COMMUNITY)
Admission: EM | Admit: 2022-10-25 | Discharge: 2022-10-25 | Disposition: A | Discharge status: Home / Self Care | Payer: Medicare HMO | Attending: Emergency Medicine | Admitting: Emergency Medicine

## 2022-10-25 DIAGNOSIS — Z91010 Allergy to peanuts: Secondary | ICD-10-CM | POA: Diagnosis not present

## 2022-10-25 DIAGNOSIS — M5442 Lumbago with sciatica, left side: Secondary | ICD-10-CM | POA: Diagnosis not present

## 2022-10-25 DIAGNOSIS — M5432 Sciatica, left side: Secondary | ICD-10-CM

## 2022-10-25 DIAGNOSIS — J45909 Unspecified asthma, uncomplicated: Secondary | ICD-10-CM | POA: Insufficient documentation

## 2022-10-25 DIAGNOSIS — M545 Low back pain, unspecified: Secondary | ICD-10-CM | POA: Diagnosis not present

## 2022-10-25 MED ORDER — HYDROCODONE-ACETAMINOPHEN 5-325 MG PO TABS: ORAL_TABLET | ORAL | 0 refills | Status: DC

## 2022-10-25 MED ORDER — METHOCARBAMOL 500 MG PO TABS: 500.0000 mg | ORAL_TABLET | Freq: Three times a day (TID) | ORAL | 0 refills | Status: DC

## 2022-10-25 NOTE — Discharge Instructions (Signed)
Avoid twisting bending movements.  Take the medication as directed.  This may cause drowsiness.  Do not operate or drive machinery while taking this medication.  You may alternate ice and heat to your lower back.  You may also use over-the-counter 4% lidocaine patches applied to the affected area of your back.  Apply the patches as directed.  Please follow-up with your primary care provider for recheck, return to emergency department for any new or worsening symptoms.

## 2022-10-25 NOTE — ED Provider Notes (Signed)
[place] EMERGENCY DEPARTMENT AT [facility] Provider Note   CSN: [id number] Arrival date & time: 10/25/22  1835     History  Chief Complaint  Patient presents with   Back Pain    [name] is a 52 y.o. female.   Back Pain Associated symptoms: no abdominal pain, no chest pain, no dysuria, no fever, no headaches, no numbness and no weakness        [name] is a 52 y.o. female past medical history of Crohn's, GERD, peptic ulcer disease, and asthma who presents to the Emergency Department complaining of left-sided low back pain for several weeks.  She describes a burning sensation of her left low back, left buttock and left thigh area.  Pain worsens with certain positions, only improves minimally when in the fetal position.  She was seen by her PCP and had x-rays of her lower back few months ago.  She was seen again last week and is awaiting for physical therapy.  She denies any abdominal pain, numbness or weakness of her lower extremities, urine or bowel changes, fever or chills, flank pain or shortness of breath.  Home Medications Prior to Admission medications   Medication Sig Start Date End Date Taking? Authorizing Provider  acetaminophen (TYLENOL) 500 MG tablet Take 1,000 mg by mouth every 6 (six) hours as needed for mild pain.    [provider]  calcium-vitamin D (OSCAL WITH D) 500-200 MG-UNIT tablet Take 1 tablet by mouth daily.    [provider]  dicyclomine (BENTYL) 10 MG capsule Take 1 capsule (10 mg total) by mouth every 6 (six) hours as needed for spasms (abdominal pain). 10/05/22   Kerri Perches, MD  hydrOXYzine (ATARAX) 50 MG tablet Take two tablets by mouth at bedtime for sleep 10/21/22   Kerri Perches, MD  iron polysaccharides (NIFEREX) 150 MG capsule TAKE 1 CAPSULE(150 MG) BY MOUTH DAILY Patient taking differently: Take 150 mg by mouth daily. 09/09/22   Kerri Perches, MD  magnesium oxide (MAG-OX) 400 (240 Mg) MG  tablet Take 400 mg by mouth daily.    [provider]  Multiple Vitamin (MULTIVITAMIN WITH MINERALS) TABS tablet Take 1 tablet by mouth daily.    [provider]  Omega-3 Fatty Acids (FISH OIL) 1200 MG CAPS Take 1,200 mg by mouth daily.     [provider]  ondansetron (ZOFRAN) 4 MG tablet Take 1 tablet (4 mg total) by mouth every 6 (six) hours as needed for nausea or vomiting. 10/05/22   Tiffany Kocher, PA-C  pantoprazole (PROTONIX) 40 MG tablet Take 1 tablet (40 mg total) by mouth daily. 09/28/22   Vassie Loll, MD  polyethylene glycol powder [facility]) 17 GM/SCOOP powder Take two capfuls in 12 ounces of liquid daily until soft stools that are easy to pass, then continue one capful daily as needed. 10/19/22   Tiffany Kocher, PA-C  predniSONE (DELTASONE) 5 MG tablet Take 8 tablets by mouth daily x 7 days; then 7 tablets by mouth daily x 7 days; then 6 tablet by mouth daily x 7 days; then 5 tablet by mouth daily x 7 days; then 4 tablets by mouth daily x 7 days; then 3 tablet by mouth daily x 7 days; then 2 tablets by mouth x 7 days; then 1 tablet by mouth x 7 days and stop prednisone. 09/27/22   Vassie Loll, MD  Probiotic Product (PROBIOTIC PO) Take 1 tablet by mouth daily.  [provider]  Pyridoxine HCl (VITAMIN B6 PO) Take 1 tablet by mouth daily.    [provider]  Ustekinumab (STELARA [place]) Inject 90 mg into the skin as directed. Every 8 weeks    [provider]  venlafaxine XR (EFFEXOR-XR) 37.5 MG 24 hr capsule TAKE 1 CAPSULE(37.5 MG) BY MOUTH DAILY WITH BREAKFAST Patient taking differently: Take 37.5 mg by mouth daily with breakfast. 09/09/22   Kerri Perches, MD  vitamin C (ASCORBIC ACID) 500 MG tablet Take 500 mg by mouth daily.    [provider]  Vitamin D, Ergocalciferol, (DRISDOL) 1.25 MG (50000 UNIT) CAPS capsule Take 50,000 Units by mouth once a week. 10/10/22   [provider]      Allergies     Feraheme [ferumoxytol], Peanut-containing drug products, Shellfish allergy, Oxycodone-acetaminophen, and Tramadol hcl    Review of Systems   Review of Systems  Constitutional:  Negative for appetite change, chills and fever.  Respiratory:  Negative for shortness of breath.   Cardiovascular:  Negative for chest pain.  Gastrointestinal:  Negative for abdominal pain, diarrhea, nausea and vomiting.  Genitourinary:  Negative for dysuria and flank pain.  Musculoskeletal:  Positive for back pain. Negative for neck pain.  Skin:  Negative for color change, rash and wound.  Neurological:  Negative for dizziness, weakness, numbness and headaches.    Physical Exam Updated Vital Signs BP (!) 153/75 (BP Location: Right Arm)   Pulse 99   Temp 98.6 F (37 C) (Oral)   Resp 16   Ht 5\' 7"  (1.702 m)   Wt 82.5 kg   LMP  (LMP Unknown)   SpO2 96%   BMI 28.49 kg/m  Physical Exam Vitals and nursing note reviewed.  Constitutional:      General: She is not in acute distress.    Appearance: Normal appearance. She is not ill-appearing or toxic-appearing.  Cardiovascular:     Rate and Rhythm: Normal rate and regular rhythm.     Pulses: Normal pulses.  Pulmonary:     Effort: Pulmonary effort is normal.  Abdominal:     General: There is no distension.     Palpations: Abdomen is soft.     Tenderness: There is no abdominal tenderness.  Musculoskeletal:        General: Tenderness present. No signs of injury.     Lumbar back: No edema. Normal range of motion. Positive left straight leg raise test. Negative right straight leg raise test. No scoliosis.     Right lower leg: No edema.     Left lower leg: No edema.     Comments: Tenderness palpation of the left lower lumbar paraspinal muscles and left SI joint space.  She has a positive straight leg raise on the left.  Maintains full range of motion of the hip flexors and extensors.  Skin:    General: Skin is warm.     Capillary Refill: Capillary refill  takes less than 2 seconds.     Findings: No rash.  Neurological:     General: No focal deficit present.     Mental Status: She is alert.     Sensory: No sensory deficit.     Motor: No weakness.     ED Results / Procedures / Treatments   Labs (all labs ordered are listed, but only abnormal results are displayed) Labs Reviewed - No data to display  EKG None  Radiology No results found.  Procedures Procedures    Medications Ordered in  ED Medications - No data to display  ED Course/ Medical Decision Making/ A&P                                 Medical Decision Making Patient here with left-sided low back pain persistent for several weeks.  Not improving with stretches and exercises at home or over-the-counter medications.  Has history of Crohn's disease and currently taking Stelara.   Triage note mentions fall 2 to 3 weeks ago but patient does not report this to me.  She has been having low back pain since June.  On exam, she has some localized tenderness of the lumbar paraspinal area.  Positive straight leg raise, I do not appreciate any focal neurodeficits.  I suspect this is musculoskeletal pain, cauda equina, UTI, pyelonephritis, kidney stone also all considered but felt less likely given reproducible pain to the lumbar region.  No recent procedure to suggest a spinal abscess.  No red flags on her exam.  Amount and/or Complexity of Data Reviewed Discussion of management or test interpretation with external provider(s): Patient had plain films of the lumbar spine in June that showed degenerative changes, images were reviewed by me.  I suspect this is musculoskeletal pain and secondary to her degenerative disc disease.  She is ambulatory in the department, no focal neurodeficits.  No indication for emergent imaging at this time.  I feel she is appropriate for discharge home with short course of pain medication as I will refrain from using anti-inflammatories.  Will recommend  lidocaine patches as well.  She will follow back up with PCP and she is currently waiting for initiation of physical therapy.  Return precautions were discussed.           Final Clinical Impression(s) / ED Diagnoses Final diagnoses:  Sciatica of left side    Rx / DC Orders ED Discharge Orders     None         Rosey Bath 10/25/22 2004    Eber Hong, MD 10/25/22 2219

## 2022-10-25 NOTE — ED Triage Notes (Signed)
Pt endorses lower back pain. Endorses that for the past few weeks she has had burning pain to lower back on both sides. Seen by Dr. Lodema Hong and has xrays months ago. Ambulatory to triage, no incontinence or urinary symptoms. Pt endorses fall 2-3 weeks ago.

## 2022-10-26 ENCOUNTER — Encounter: Payer: Self-pay | Admitting: Family Medicine

## 2022-10-26 NOTE — Assessment & Plan Note (Signed)
Benefiting greatly from therapy, needs psych appt

## 2022-10-26 NOTE — Progress Notes (Signed)
   GWYNETH ROUSSELLE     MRN: [id number]      DOB: March 24, 1970  Chief Complaint  Patient presents with   Follow-up    HPI [name] is here for follow up and re-evaluation of chronic medical conditions, medication management and review of any available recent lab and radiology data.  C/o ongoing low back pain radiating down both legs to back of knee, denies new weakness or numbness , denies incontinence. Pain is rated at 8, interferes with ADL's Sleep is a challenge in that she will fall asleep but has difficulty staying asleep. Denies hangover or adverse s/e of hydroxyzine ROS Denies recent fever or chills. Denies sinus pressure, nasal congestion, ear pain or sore throat. Denies chest congestion, productive cough or wheezing. Denies chest pains, palpitations and leg swelling Denies abdominal pain, nausea, vomiting,diarrhea or constipation.   Denies dysuria, frequency, hesitancy or incontinence. Denies skin break down or rash. Ongoing challenge with grief at recent loss of son to suicide, great benefit from therapy, missed app with psych as hospitalized , will reach out to Psych   PE  BP 124/84   Pulse 95   Ht 5\' 7"  (1.702 m)   Wt 182 lb (82.6 kg)   LMP  (LMP Unknown)   SpO2 96%   BMI 28.51 kg/m   Patient alert and oriented and in no cardiopulmonary distress.  HEENT: No facial asymmetry, EOMI,     Neck supple .  Chest: Clear to auscultation bilaterally.  CVS: S1, S2 no murmurs, no S3.Regular rate.  .   Ext: No edema  MS: decreased ROM lumbar spine, adequate in shoulders  and knees Skin: Intact, no ulcerations or rash noted.  Psych: Good eye contact, normal affect. Memory intact not anxious or depressed appearing.  CNS: CN 2-12 intact, power,  normal throughout.no focal deficits noted.   Assessment & Plan  Back pain Rated at 8 goes to back of knees, refer to PT twice weekly for 6 weeks  Complicated grief Benefiting greatly from therapy, needs psych appt  Small  bowel obstruction (HCC) No symptoms of obstruction an abdominal pain is minimal, started on new medication with noted benefit

## 2022-10-26 NOTE — Assessment & Plan Note (Signed)
No symptoms of obstruction an abdominal pain is minimal, started on new medication with noted benefit

## 2022-10-27 ENCOUNTER — Other Ambulatory Visit: Payer: Self-pay

## 2022-11-01 ENCOUNTER — Ambulatory Visit: Payer: Medicare HMO | Attending: Family Medicine

## 2022-11-01 ENCOUNTER — Telehealth: Payer: Self-pay

## 2022-11-01 DIAGNOSIS — G8929 Other chronic pain: Secondary | ICD-10-CM | POA: Insufficient documentation

## 2022-11-01 DIAGNOSIS — M5459 Other low back pain: Secondary | ICD-10-CM | POA: Diagnosis not present

## 2022-11-01 DIAGNOSIS — M5432 Sciatica, left side: Secondary | ICD-10-CM | POA: Diagnosis not present

## 2022-11-01 DIAGNOSIS — M5441 Lumbago with sciatica, right side: Secondary | ICD-10-CM | POA: Diagnosis not present

## 2022-11-01 DIAGNOSIS — M5431 Sciatica, right side: Secondary | ICD-10-CM | POA: Insufficient documentation

## 2022-11-01 DIAGNOSIS — M5442 Lumbago with sciatica, left side: Secondary | ICD-10-CM | POA: Insufficient documentation
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
## 2022-11-01 NOTE — Telephone Encounter (Signed)
Transition Care Management Unsuccessful Follow-up Telephone Call  Date of discharge and from where:  [facility]  Attempts:  1st Attempt  Reason for unsuccessful TCM follow-up call:  No answer/busy   Lenard Forth [place]  [facility], [facility] Guide, Phone: [phone] Website: [email or web address]

## 2022-11-02 ENCOUNTER — Encounter: Payer: Self-pay | Admitting: Gastroenterology

## 2022-11-02 ENCOUNTER — Ambulatory Visit (HOSPITAL_COMMUNITY)
Admission: RE | Admit: 2022-11-02 | Discharge: 2022-11-02 | Disposition: A | Discharge status: Home / Self Care | Payer: Medicare HMO | Source: Ambulatory Visit | Attending: Gastroenterology | Admitting: Gastroenterology

## 2022-11-02 ENCOUNTER — Ambulatory Visit (INDEPENDENT_AMBULATORY_CARE_PROVIDER_SITE_OTHER): Payer: Medicare HMO | Admitting: Gastroenterology

## 2022-11-02 ENCOUNTER — Telehealth: Payer: Self-pay

## 2022-11-02 VITALS — BP 137/80 | HR 102 | Temp 96.6°F | Ht 68.0 in | Wt 184.9 lb

## 2022-11-02 DIAGNOSIS — K5090 Crohn's disease, unspecified, without complications: Secondary | ICD-10-CM | POA: Diagnosis not present

## 2022-11-02 DIAGNOSIS — K5080 Crohn's disease of both small and large intestine without complications: Secondary | ICD-10-CM

## 2022-11-02 DIAGNOSIS — R1010 Upper abdominal pain, unspecified: Secondary | ICD-10-CM | POA: Diagnosis not present

## 2022-11-02 MED ORDER — ONDANSETRON HCL 4 MG PO TABS: 4.0000 mg | ORAL_TABLET | Freq: Four times a day (QID) | ORAL | 1 refills | Status: DC | PRN

## 2022-11-02 MED ORDER — BUDESONIDE 3 MG PO CPEP: ORAL_CAPSULE | ORAL | 0 refills | Status: DC

## 2022-11-02 NOTE — Progress Notes (Deleted)
Friday at home and started havin pain above umbilicus, intense, felt like a hand was in her gut moving things through. Had just been to the ED with sciatica discomfort.   Drank liquids all weekend. Severe nausea but not vomiting during this weekend. Pain was constant and intense. Level 9. Passed flatus Saturday evening. No fever/chills.  No diarrhea. Stools more like milkshake.   Has been on prednisone taper. Had been on 20 mg prednisone last Wednesday or Thursday. Has taken budesonide in the past. Last BM this morning, soft, no rectal bleeding. Has been on 30 mg prednisone since Monday.   Pain is now at a level 8. Nausea has decreased somewhat. Eating pudding, applesauce, juice, rice, mashed potatoes.

## 2022-11-02 NOTE — Patient Instructions (Signed)
Please have blood work today and complete the stool test when you are able. The stool test will be more like a marker for [place] as we move forward in medication therapy for disease control.  I have sent in budesonide 9 mg to take instead of prednisone. This may need a prior authorization. Take the 30 mg prednisone for now until we get budesonide!  I have ordered an abdominal xray. Please call if worsening symptoms.   We will see you in 4-6 weeks.   I enjoyed seeing you again today! I value our relationship and want to provide genuine, compassionate, and quality care. You may receive a survey regarding your visit with me, and I welcome your feedback! Thanks so much for taking the time to complete this. I look forward to seeing you again.      Gelene Mink, PhD, ANP-BC [facility]
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
## 2022-11-02 NOTE — Telephone Encounter (Signed)
Transition Care Management Unsuccessful Follow-up Telephone Call  Date of discharge and from where:  [name] 8/20  Attempts:  2nd Attempt  Reason for unsuccessful TCM follow-up call:  No answer/busy   Lenard Forth Bertrand  [facility], [facility] Guide, Phone: [phone] Website: [email or web address]

## 2022-11-03 ENCOUNTER — Ambulatory Visit: Payer: Medicare HMO

## 2022-11-03 ENCOUNTER — Other Ambulatory Visit: Payer: Self-pay | Admitting: Family Medicine

## 2022-11-03 DIAGNOSIS — M5441 Lumbago with sciatica, right side: Secondary | ICD-10-CM | POA: Diagnosis not present

## 2022-11-03 DIAGNOSIS — M5432 Sciatica, left side: Secondary | ICD-10-CM | POA: Diagnosis not present

## 2022-11-03 DIAGNOSIS — M5431 Sciatica, right side: Secondary | ICD-10-CM

## 2022-11-03 DIAGNOSIS — M5459 Other low back pain: Secondary | ICD-10-CM

## 2022-11-03 DIAGNOSIS — G8929 Other chronic pain: Secondary | ICD-10-CM | POA: Diagnosis not present

## 2022-11-03 DIAGNOSIS — M5442 Lumbago with sciatica, left side: Secondary | ICD-10-CM | POA: Diagnosis not present

## 2022-11-03 LAB — COMPREHENSIVE METABOLIC PANEL
ALT: 14 IU/L (ref 0–32)
AST: 13 IU/L (ref 0–40)
Albumin: 3.6 g/dL — ABNORMAL LOW (ref 3.8–4.9)
Alkaline Phosphatase: 80 IU/L (ref 44–121)
BUN/Creatinine Ratio: 19 (ref 9–23)
BUN: 15 mg/dL (ref 6–24)
Bilirubin Total: <0.2 mg/dL (ref 0.0–1.2)
CO2: 24 mmol/L (ref 20–29)
Calcium: 9.3 mg/dL (ref 8.7–10.2)
Chloride: 104 mmol/L (ref 96–106)
Creatinine, Ser: 0.79 mg/dL (ref 0.57–1.00)
Globulin, Total: 3 g/dL (ref 1.5–4.5)
Glucose: 97 mg/dL (ref 70–99)
Potassium: 5.4 mmol/L — ABNORMAL HIGH (ref 3.5–5.2)
Sodium: 143 mmol/L (ref 134–144)
Total Protein: 6.6 g/dL (ref 6.0–8.5)
eGFR: 90 mL/min/{1.73_m2} (ref >59)

## 2022-11-03 LAB — CBC WITH DIFFERENTIAL/PLATELET
Basophils Absolute: 0.1 10*3/uL (ref 0.0–0.2)
Basos: 0 %
EOS (ABSOLUTE): 0 10*3/uL (ref 0.0–0.4)
Eos: 0 %
Hematocrit: 35.7 % (ref 34.0–46.6)
Hemoglobin: 11.5 g/dL (ref 11.1–15.9)
Immature Grans (Abs): 0.1 10*3/uL (ref 0.0–0.1)
Immature Granulocytes: 1 %
Lymphocytes Absolute: 3.1 10*3/uL (ref 0.7–3.1)
Lymphs: 21 %
MCH: 27.8 pg (ref 26.6–33.0)
MCHC: 32.2 g/dL (ref 31.5–35.7)
MCV: 86 fL (ref 79–97)
Monocytes Absolute: 0.3 10*3/uL (ref 0.1–0.9)
Monocytes: 2 %
Neutrophils Absolute: 11.4 10*3/uL — ABNORMAL HIGH (ref 1.4–7.0)
Neutrophils: 76 %
Platelets: 549 10*3/uL — ABNORMAL HIGH (ref 150–450)
RBC: 4.14 x10E6/uL (ref 3.77–5.28)
RDW: 15.9 % — ABNORMAL HIGH (ref 11.7–15.4)
WBC: 15 10*3/uL — ABNORMAL HIGH (ref 3.4–10.8)

## 2022-11-03 LAB — C-REACTIVE PROTEIN: CRP: 9 mg/L (ref 0–10)

## 2022-11-03 LAB — SEDIMENTATION RATE: Sed Rate: 6 mm/h (ref 0–40)
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
## 2022-11-04 ENCOUNTER — Ambulatory Visit: Payer: Medicare HMO | Admitting: Professional Counselor

## 2022-11-08 ENCOUNTER — Ambulatory Visit: Payer: Medicare HMO

## 2022-11-09 ENCOUNTER — Other Ambulatory Visit: Payer: Self-pay | Admitting: Gastroenterology

## 2022-11-09 DIAGNOSIS — K5080 Crohn's disease of both small and large intestine without complications: Secondary | ICD-10-CM | POA: Diagnosis not present

## 2022-11-10 ENCOUNTER — Ambulatory Visit: Payer: Medicare HMO | Attending: Family Medicine

## 2022-11-10 DIAGNOSIS — M5431 Sciatica, right side: Secondary | ICD-10-CM | POA: Insufficient documentation

## 2022-11-10 DIAGNOSIS — M5432 Sciatica, left side: Secondary | ICD-10-CM | POA: Insufficient documentation

## 2022-11-10 DIAGNOSIS — M5459 Other low back pain: Secondary | ICD-10-CM | POA: Diagnosis not present
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
## 2022-11-13 LAB — CALPROTECTIN, FECAL: Calprotectin, Fecal: 769 ug/g — ABNORMAL HIGH (ref 0–120)

## 2022-11-14 ENCOUNTER — Other Ambulatory Visit: Payer: Self-pay

## 2022-11-14 ENCOUNTER — Ambulatory Visit: Payer: Medicare HMO

## 2022-11-14 ENCOUNTER — Other Ambulatory Visit: Payer: Self-pay | Admitting: Family Medicine

## 2022-11-14 DIAGNOSIS — M5432 Sciatica, left side: Secondary | ICD-10-CM

## 2022-11-14 DIAGNOSIS — K5080 Crohn's disease of both small and large intestine without complications: Secondary | ICD-10-CM

## 2022-11-14 DIAGNOSIS — M5431 Sciatica, right side: Secondary | ICD-10-CM | POA: Diagnosis not present

## 2022-11-14 DIAGNOSIS — M5459 Other low back pain: Secondary | ICD-10-CM | POA: Diagnosis not present

## 2022-11-14 DIAGNOSIS — D509 Iron deficiency anemia, unspecified: Secondary | ICD-10-CM
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
## 2022-11-14 NOTE — Telephone Encounter (Signed)
Pt was made aware and verbalized understanding. Pt was scheduled an appt with Tobi Bastos on 11/29/2022.

## 2022-11-14 NOTE — Telephone Encounter (Signed)
noted 

## 2022-11-14 NOTE — Telephone Encounter (Signed)
Please have patient continue on soft diets, if significant pain, she can further reduce to clear liquids.   She should stay on entocort 9mg  daily for right now until symptoms settle down then she can complete taper per Anna's instructions.   I would like for her to increase her pantoprazole to BID before breakfast and evening meal.  She needs an ov in the next 1-2 weeks preferably with Tobi Bastos who has been following her more lately.   If symptoms don't settle down she needs to call.

## 2022-11-15 ENCOUNTER — Other Ambulatory Visit: Payer: Self-pay | Admitting: Family Medicine

## 2022-11-16 ENCOUNTER — Ambulatory Visit: Payer: Medicare HMO

## 2022-11-17 ENCOUNTER — Other Ambulatory Visit: Payer: Self-pay | Admitting: Family Medicine

## 2022-11-18 ENCOUNTER — Other Ambulatory Visit: Payer: Self-pay

## 2022-11-18 ENCOUNTER — Telehealth: Payer: Self-pay | Admitting: Family Medicine

## 2022-11-18 ENCOUNTER — Ambulatory Visit: Payer: Medicare HMO | Admitting: Professional Counselor

## 2022-11-18 DIAGNOSIS — F330 Major depressive disorder, recurrent, mild: Secondary | ICD-10-CM | POA: Diagnosis not present

## 2022-11-18 DIAGNOSIS — D509 Iron deficiency anemia, unspecified: Secondary | ICD-10-CM | POA: Diagnosis not present

## 2022-11-18 DIAGNOSIS — K5080 Crohn's disease of both small and large intestine without complications: Secondary | ICD-10-CM | POA: Diagnosis not present

## 2022-11-18 MED ORDER — METHOCARBAMOL 500 MG PO TABS: 500.0000 mg | ORAL_TABLET | Freq: Three times a day (TID) | ORAL | 0 refills | Status: DC

## 2022-11-18 NOTE — BH Specialist Note (Signed)
[facility] Telephone Follow-up  MRN: [id number] NAME: [name] Date: 11/18/22  Start time: Start Time: 1000 End time: Stop Time: 1035 Total time: Total Time in Minutes (Visit): 35 Call number: Visit Number: Additional Visit  Reason for call today:  The patient, a 52 year old female, returned for a collaborative care follow-up in a positive mood. She continues to show improvement with decreased GAD-7 and PHQ-9 scores, indicating reduced anxiety and depressive symptoms. The patient remains compliant with her medication regimen and reports feeling satisfactory overall. Despite her progress, a few recent situations have triggered feelings of grief, including an emotional moment at work. Fortunately, she received strong support from her colleagues during this time. The patient is still seeking closure regarding her son's suicide and continues to express a desire to see the vehicle in which he passed away, believing it may help her process her loss. We continue to employ mindfulness techniques and grief education to assist her in navigating these emotions. Overall, it was a positive session, and a follow-up is scheduled in two weeks.  PHQ-9 Scores:     11/18/2022   10:17 AM 10/21/2022    9:11 AM 10/20/2022   11:55 AM 10/13/2022    9:20 AM 10/04/2022    8:43 AM  Depression screen PHQ 2/9  Decreased Interest 1 1 1 1 1   Down, Depressed, Hopeless 1 0 0 0 0  PHQ - 2 Score 2 1 1 1 1   Altered sleeping 2 2 2 2 2   Tired, decreased energy 1 0 1 1 0  Change in appetite 0 0 0 0 0  Feeling bad or failure about yourself  1 0 0 0 1  Trouble concentrating 0 0 0 0 0  Moving slowly or fidgety/restless 0 0 0 0 0  Suicidal thoughts 0 0 0 0 0  PHQ-9 Score 6 3 4 4 4   Difficult doing work/chores  Somewhat difficult Somewhat difficult Somewhat difficult Not difficult at all   GAD-7 Scores:     11/18/2022   10:18 AM 10/21/2022    9:11 AM 10/20/2022   11:56 AM 10/13/2022    9:20 AM  GAD 7 :  Generalized Anxiety Score  Nervous, Anxious, on Edge 0 0 0 0  Control/stop worrying 0 0 1 0  Worry too much - different things 1 0 1 0  Trouble relaxing 1 1 0 1  Restless 0 1 0 0  Easily annoyed or irritable 1 0 0 0  Afraid - awful might happen 0 0 0 0  Total GAD 7 Score 3 2 2 1   Anxiety Difficulty Somewhat difficult Somewhat difficult Not difficult at all Not difficult at all    Stress Current stressors:  work Sleep:  improved Appetite:  good Coping ability:  good Patient taking medications as prescribed:    Current medications:  Outpatient Encounter Medications as of 11/18/2022  Medication Sig   budesonide (ENTOCORT EC) 3 MG 24 hr capsule Take 3 capsules (9 mg total) by mouth daily for 14 days, THEN 2 capsules (6 mg total) daily for 14 days, THEN 1 capsule (3 mg total) daily for 14 days.   calcium-vitamin D (OSCAL WITH D) 500-200 MG-UNIT tablet Take 1 tablet by mouth daily.   dicyclomine (BENTYL) 10 MG capsule Take 1 capsule (10 mg total) by mouth every 6 (six) hours as needed for spasms (abdominal pain). (Patient not taking: Reported on 11/02/2022)   HYDROcodone-acetaminophen (NORCO/VICODIN) 5-325 MG tablet Take one tab po q 4 hrs  prn pain   hydrOXYzine (ATARAX) 50 MG tablet TAKE 2 TABLETS BY MOUTH AT BEDTIME FOR SLEEP   iron polysaccharides (NIFEREX) 150 MG capsule TAKE 1 CAPSULE(150 MG) BY MOUTH DAILY (Patient taking differently: Take 150 mg by mouth daily.)   magnesium oxide (MAG-OX) 400 (240 Mg) MG tablet Take 400 mg by mouth daily.   methocarbamol (ROBAXIN) 500 MG tablet TAKE 1 TABLET(500 MG) BY MOUTH THREE TIMES DAILY   Multiple Vitamin (MULTIVITAMIN WITH MINERALS) TABS tablet Take 1 tablet by mouth daily.   Omega-3 Fatty Acids (FISH OIL) 1200 MG CAPS Take 1,200 mg by mouth daily.    ondansetron (ZOFRAN) 4 MG tablet Take 1 tablet (4 mg total) by mouth every 6 (six) hours as needed for nausea or vomiting.   pantoprazole (PROTONIX) 40 MG tablet Take 1 tablet (40 mg total) by  mouth daily.   polyethylene glycol powder (GLYCOLAX/MIRALAX) 17 GM/SCOOP powder Take two capfuls in 12 ounces of liquid daily until soft stools that are easy to pass, then continue one capful daily as needed.   predniSONE (DELTASONE) 5 MG tablet Take 8 tablets by mouth daily x 7 days; then 7 tablets by mouth daily x 7 days; then 6 tablet by mouth daily x 7 days; then 5 tablet by mouth daily x 7 days; then 4 tablets by mouth daily x 7 days; then 3 tablet by mouth daily x 7 days; then 2 tablets by mouth x 7 days; then 1 tablet by mouth x 7 days and stop prednisone.   Probiotic Product (PROBIOTIC PO) Take 1 tablet by mouth daily.   Pyridoxine HCl (VITAMIN B6 PO) Take 1 tablet by mouth daily.   Ustekinumab (STELARA Dare) Inject 90 mg into the skin as directed. Every 8 weeks   venlafaxine XR (EFFEXOR-XR) 37.5 MG 24 hr capsule TAKE 1 CAPSULE(37.5 MG) BY MOUTH DAILY WITH BREAKFAST (Patient taking differently: Take 37.5 mg by mouth daily with breakfast.)   vitamin C (ASCORBIC ACID) 500 MG tablet Take 500 mg by mouth daily.   Vitamin D, Ergocalciferol, (DRISDOL) 1.25 MG (50000 UNIT) CAPS capsule Take 50,000 Units by mouth once a week.   No facility-administered encounter medications on file as of 11/18/2022.     Self-harm Behaviors Risk Assessment Self-harm risk factors:  Past attempt Patient endorses recent thoughts of harming self:  Denies   Danger to Others Risk Assessment Danger to others risk factors:  None Patient endorses recent thoughts of harming others:  Denies   Goals, Interventions and Follow-up Plan Goals:  Maintain improved mood and continue to work through grief Interventions: CBT Cognitive Behavioral Therapy Follow-up Plan:  2 weeks   Reuel Boom

## 2022-11-18 NOTE — Telephone Encounter (Signed)
PATIENT IS COMPLETELY OUT OF THIS MEDICATION. NEED REFILL  Prescription Request  11/18/2022  LOV: 10/21/2022  What is the name of the medication or equipment? methocarbamol (ROBAXIN) 500 MG tablet [[id number]   Have you contacted your pharmacy to request a refill? Yes   Which pharmacy would you like this sent to?  [id number] Nicholes Rough, [place] - [address] AT [facility] OF [address] [place] [address] Phone: [phone] Fax: [phone]    Patient notified that their request is being sent to the clinical staff for review and that they should receive a response within 2 business days.   Please advise at  [facility] office

## 2022-11-18 NOTE — Telephone Encounter (Signed)
Med refilled.

## 2022-11-19 DIAGNOSIS — Z91010 Allergy to peanuts: Secondary | ICD-10-CM | POA: Diagnosis not present

## 2022-11-19 DIAGNOSIS — R590 Localized enlarged lymph nodes: Secondary | ICD-10-CM | POA: Diagnosis not present

## 2022-11-19 DIAGNOSIS — K5090 Crohn's disease, unspecified, without complications: Secondary | ICD-10-CM | POA: Diagnosis not present

## 2022-11-19 DIAGNOSIS — K56609 Unspecified intestinal obstruction, unspecified as to partial versus complete obstruction: Secondary | ICD-10-CM | POA: Diagnosis not present

## 2022-11-19 DIAGNOSIS — K50812 Crohn's disease of both small and large intestine with intestinal obstruction: Secondary | ICD-10-CM | POA: Insufficient documentation

## 2022-11-19 DIAGNOSIS — R140 Abdominal distension (gaseous): Secondary | ICD-10-CM | POA: Diagnosis not present

## 2022-11-19 DIAGNOSIS — R1084 Generalized abdominal pain: Secondary | ICD-10-CM | POA: Diagnosis not present

## 2022-11-19 DIAGNOSIS — K8020 Calculus of gallbladder without cholecystitis without obstruction: Secondary | ICD-10-CM | POA: Diagnosis not present

## 2022-11-19 DIAGNOSIS — Z4682 Encounter for fitting and adjustment of non-vascular catheter: Secondary | ICD-10-CM | POA: Diagnosis not present

## 2022-11-19 DIAGNOSIS — R0989 Other specified symptoms and signs involving the circulatory and respiratory systems: Secondary | ICD-10-CM | POA: Diagnosis not present

## 2022-11-19 LAB — BASIC METABOLIC PANEL
BUN/Creatinine Ratio: 15 (ref 9–23)
BUN: 11 mg/dL (ref 6–24)
CO2: 24 mmol/L (ref 20–29)
Calcium: 8.8 mg/dL (ref 8.7–10.2)
Chloride: 102 mmol/L (ref 96–106)
Creatinine, Ser: 0.72 mg/dL (ref 0.57–1.00)
Glucose: 78 mg/dL (ref 70–99)
Potassium: 4.2 mmol/L (ref 3.5–5.2)
Sodium: 140 mmol/L (ref 134–144)
eGFR: 101 mL/min/{1.73_m2} (ref >59)

## 2022-11-19 MED ORDER — METHOCARBAMOL 500 MG PO TABS: 500.0000 mg | ORAL_TABLET | Freq: Three times a day (TID) | ORAL | 1 refills | Status: DC | PRN

## 2022-11-19 NOTE — Addendum Note (Signed)
Addended by: Syliva Overman E on: 11/19/2022 12:58 AM   Modules accepted: Orders

## 2022-11-20 ENCOUNTER — Encounter (HOSPITAL_COMMUNITY): Payer: Self-pay | Admitting: Emergency Medicine

## 2022-11-20 ENCOUNTER — Emergency Department (HOSPITAL_COMMUNITY)
Admission: EM | Admit: 2022-11-20 | Discharge: 2022-11-20 | Disposition: A | Discharge status: Other Acute Inpatient Hospital | Payer: Medicare HMO | Attending: Emergency Medicine | Admitting: Emergency Medicine

## 2022-11-20 ENCOUNTER — Emergency Department (HOSPITAL_COMMUNITY): Payer: Medicare HMO

## 2022-11-20 ENCOUNTER — Other Ambulatory Visit: Payer: Self-pay

## 2022-11-20 DIAGNOSIS — R0989 Other specified symptoms and signs involving the circulatory and respiratory systems: Secondary | ICD-10-CM | POA: Diagnosis not present

## 2022-11-20 DIAGNOSIS — Z9049 Acquired absence of other specified parts of digestive tract: Secondary | ICD-10-CM | POA: Diagnosis not present

## 2022-11-20 DIAGNOSIS — K8020 Calculus of gallbladder without cholecystitis without obstruction: Secondary | ICD-10-CM | POA: Diagnosis not present

## 2022-11-20 DIAGNOSIS — R109 Unspecified abdominal pain: Secondary | ICD-10-CM | POA: Diagnosis not present

## 2022-11-20 DIAGNOSIS — K219 Gastro-esophageal reflux disease without esophagitis: Secondary | ICD-10-CM | POA: Diagnosis not present

## 2022-11-20 DIAGNOSIS — K50812 Crohn's disease of both small and large intestine with intestinal obstruction: Secondary | ICD-10-CM | POA: Diagnosis not present

## 2022-11-20 DIAGNOSIS — K50012 Crohn's disease of small intestine with intestinal obstruction: Secondary | ICD-10-CM | POA: Diagnosis not present

## 2022-11-20 DIAGNOSIS — K9131 Postprocedural partial intestinal obstruction: Secondary | ICD-10-CM | POA: Diagnosis not present

## 2022-11-20 DIAGNOSIS — K50014 Crohn's disease of small intestine with abscess: Secondary | ICD-10-CM | POA: Diagnosis not present

## 2022-11-20 DIAGNOSIS — K56609 Unspecified intestinal obstruction, unspecified as to partial versus complete obstruction: Secondary | ICD-10-CM | POA: Diagnosis not present

## 2022-11-20 DIAGNOSIS — K50914 Crohn's disease, unspecified, with abscess: Secondary | ICD-10-CM | POA: Diagnosis not present

## 2022-11-20 DIAGNOSIS — F418 Other specified anxiety disorders: Secondary | ICD-10-CM | POA: Diagnosis not present

## 2022-11-20 DIAGNOSIS — R590 Localized enlarged lymph nodes: Secondary | ICD-10-CM | POA: Diagnosis not present

## 2022-11-20 DIAGNOSIS — F319 Bipolar disorder, unspecified: Secondary | ICD-10-CM | POA: Diagnosis not present

## 2022-11-20 DIAGNOSIS — K56600 Partial intestinal obstruction, unspecified as to cause: Secondary | ICD-10-CM | POA: Diagnosis not present

## 2022-11-20 DIAGNOSIS — K50912 Crohn's disease, unspecified, with intestinal obstruction: Secondary | ICD-10-CM | POA: Diagnosis not present

## 2022-11-20 DIAGNOSIS — R1084 Generalized abdominal pain: Secondary | ICD-10-CM | POA: Diagnosis not present

## 2022-11-20 DIAGNOSIS — Z4682 Encounter for fitting and adjustment of non-vascular catheter: Secondary | ICD-10-CM | POA: Diagnosis not present

## 2022-11-20 DIAGNOSIS — Z91010 Allergy to peanuts: Secondary | ICD-10-CM | POA: Diagnosis not present

## 2022-11-20 DIAGNOSIS — K5669 Other partial intestinal obstruction: Secondary | ICD-10-CM | POA: Diagnosis not present

## 2022-11-20 DIAGNOSIS — I498 Other specified cardiac arrhythmias: Secondary | ICD-10-CM | POA: Diagnosis not present

## 2022-11-20 DIAGNOSIS — G8929 Other chronic pain: Secondary | ICD-10-CM | POA: Diagnosis not present

## 2022-11-20 DIAGNOSIS — D638 Anemia in other chronic diseases classified elsewhere: Secondary | ICD-10-CM | POA: Diagnosis not present

## 2022-11-20 DIAGNOSIS — Z79899 Other long term (current) drug therapy: Secondary | ICD-10-CM | POA: Diagnosis not present

## 2022-11-20 DIAGNOSIS — M542 Cervicalgia: Secondary | ICD-10-CM | POA: Diagnosis not present

## 2022-11-20 DIAGNOSIS — R140 Abdominal distension (gaseous): Secondary | ICD-10-CM | POA: Diagnosis not present

## 2022-11-20 DIAGNOSIS — K50814 Crohn's disease of both small and large intestine with abscess: Secondary | ICD-10-CM | POA: Diagnosis not present

## 2022-11-20 DIAGNOSIS — M549 Dorsalgia, unspecified: Secondary | ICD-10-CM | POA: Diagnosis not present

## 2022-11-20 DIAGNOSIS — Z4659 Encounter for fitting and adjustment of other gastrointestinal appliance and device: Secondary | ICD-10-CM | POA: Diagnosis not present

## 2022-11-20 LAB — CBC
HCT: 40.2 % (ref 36.0–46.0)
Hemoglobin: 12.8 g/dL (ref 12.0–15.0)
MCH: 27.8 pg (ref 26.0–34.0)
MCHC: 31.8 g/dL (ref 30.0–36.0)
MCV: 87.2 fL (ref 80.0–100.0)
Platelets: 511 10*3/uL — ABNORMAL HIGH (ref 150–400)
RBC: 4.61 MIL/uL (ref 3.87–5.11)
RDW: 16.4 % — ABNORMAL HIGH (ref 11.5–15.5)
WBC: 14.8 10*3/uL — ABNORMAL HIGH (ref 4.0–10.5)
nRBC: 0 % (ref 0.0–0.2)

## 2022-11-20 LAB — COMPREHENSIVE METABOLIC PANEL
ALT: 15 U/L (ref 0–44)
AST: 16 U/L (ref 15–41)
Albumin: 3.2 g/dL — ABNORMAL LOW (ref 3.5–5.0)
Alkaline Phosphatase: 79 U/L (ref 38–126)
Anion gap: 11 (ref 5–15)
BUN: 8 mg/dL (ref 6–20)
CO2: 28 mmol/L (ref 22–32)
Calcium: 9.3 mg/dL (ref 8.9–10.3)
Chloride: 97 mmol/L — ABNORMAL LOW (ref 98–111)
Creatinine, Ser: 0.72 mg/dL (ref 0.44–1.00)
GFR, Estimated: >60 mL/min (ref >60)
Glucose, Bld: 130 mg/dL — ABNORMAL HIGH (ref 70–99)
Potassium: 3.2 mmol/L — ABNORMAL LOW (ref 3.5–5.1)
Sodium: 136 mmol/L (ref 135–145)
Total Bilirubin: 0.5 mg/dL (ref 0.3–1.2)
Total Protein: 7.7 g/dL (ref 6.5–8.1)

## 2022-11-20 LAB — LIPASE, BLOOD: Lipase: 24 U/L (ref 11–51)

## 2022-11-20 MED ORDER — PIPERACILLIN-TAZOBACTAM 3.375 G IVPB: 3.3750 g | Freq: Three times a day (TID) | INTRAVENOUS | Status: DC

## 2022-11-20 MED ORDER — PIPERACILLIN-TAZOBACTAM 3.375 G IVPB
3.3750 g | Freq: Four times a day (QID) | INTRAVENOUS | Status: DC
  Administered 2022-11-20: 3.375 g via INTRAVENOUS
  Filled 2022-11-20: qty 50

## 2022-11-20 MED ORDER — HYDROMORPHONE HCL 1 MG/ML IJ SOLN
1.0000 mg | Freq: Once | INTRAMUSCULAR | Status: AC
  Administered 2022-11-20: 1 mg via INTRAVENOUS
  Filled 2022-11-20: qty 1

## 2022-11-20 MED ORDER — HYDROMORPHONE HCL 1 MG/ML IJ SOLN
1.0000 mg | INTRAMUSCULAR | Status: DC | PRN
  Administered 2022-11-20 (×2): 1 mg via INTRAVENOUS
  Filled 2022-11-20 (×2): qty 1

## 2022-11-20 MED ORDER — SODIUM CHLORIDE 0.9 % IV BOLUS
500.0000 mL | Freq: Once | INTRAVENOUS | Status: AC
  Administered 2022-11-20: 500 mL via INTRAVENOUS

## 2022-11-20 MED ORDER — LORAZEPAM 2 MG/ML IJ SOLN
1.0000 mg | Freq: Once | INTRAMUSCULAR | Status: AC
  Administered 2022-11-20: 1 mg via INTRAVENOUS
  Filled 2022-11-20: qty 1

## 2022-11-20 MED ORDER — IOHEXOL 300 MG/ML  SOLN
100.0000 mL | Freq: Once | INTRAMUSCULAR | Status: AC | PRN
  Administered 2022-11-20: 100 mL via INTRAVENOUS

## 2022-11-20 MED ORDER — ONDANSETRON HCL 4 MG/2ML IJ SOLN
4.0000 mg | Freq: Once | INTRAMUSCULAR | Status: AC
  Administered 2022-11-20: 4 mg via INTRAVENOUS
  Filled 2022-11-20: qty 2

## 2022-11-20 NOTE — ED Triage Notes (Addendum)
Pt with Crohn's Disease states she has been having abdominal pain around her umbilicus for 1-2 weeks that is progressively getting worse. Pt endorses nausea but no vomiting.

## 2022-11-20 NOTE — Progress Notes (Signed)
[facility]  White blood cell 14, vital sign stable, reviewed imaging.   Dilated bowel and long segment of thickening, question structure with small abscess.    Discussed with Dr. Levon Hedger and Polina further.  In patient's best interest as she has failed her medical management they can offer. I would think she has high risk of needing an operation and given her prior surgeries it does not appear she has a lot of small bowel. She needs to be managed by team that has options for preserving as much bowel as possible, like stricturoplasty.  Algis Greenhouse, MD

## 2022-11-20 NOTE — ED Notes (Signed)
Patient transported to CT 

## 2022-11-20 NOTE — ED Provider Notes (Signed)
  Physical Exam  BP (!) 146/80   Pulse (!) 110   Temp 98.5 F (36.9 C) (Oral)   Resp 18   Ht 5\' 8"  (1.727 m)   Wt 81.6 kg   SpO2 92%   BMI 27.37 kg/m   Physical Exam  Procedures  Procedures  ED Course / MDM    Medical Decision Making Amount and/or Complexity of Data Reviewed Labs: ordered. Radiology: ordered.  Risk Prescription drug management.   Pending transfer to [facility].  Reevaluated prior to transfer.       Benjiman Core, MD 11/20/22 [phone]

## 2022-11-20 NOTE — ED Provider Notes (Signed)
[place] [facility] AT [facility] Provider Note   CSN: [id number] Arrival date & time: 11/19/22  2354     History  Chief Complaint  Patient presents with   Abdominal Pain    [name] is a 52 y.o. female.  Patient presents to the emergency department for evaluation of abdominal pain.  Patient reports that pain has been present for about 2 weeks.  She has had progressively worsening pain, mostly around the umbilicus.  She has had nausea but no vomiting.  She has had bowel movements that did not relieve the pain.       Home Medications Prior to Admission medications   Medication Sig Start Date End Date Taking? Authorizing Provider  budesonide (ENTOCORT EC) 3 MG 24 hr capsule Take 3 capsules (9 mg total) by mouth daily for 14 days, THEN 2 capsules (6 mg total) daily for 14 days, THEN 1 capsule (3 mg total) daily for 14 days. 11/02/22 12/14/22  Gelene Mink, NP  calcium-vitamin D (OSCAL WITH D) 500-200 MG-UNIT tablet Take 1 tablet by mouth daily.    [provider]  dicyclomine (BENTYL) 10 MG capsule Take 1 capsule (10 mg total) by mouth every 6 (six) hours as needed for spasms (abdominal pain). Patient not taking: Reported on 11/02/2022 10/05/22   Kerri Perches, MD  HYDROcodone-acetaminophen (NORCO/VICODIN) 5-325 MG tablet Take one tab po q 4 hrs prn pain 10/25/22   Triplett, Tammy, PA-C  hydrOXYzine (ATARAX) 50 MG tablet TAKE 2 TABLETS BY MOUTH AT BEDTIME FOR SLEEP 11/17/22   Kerri Perches, MD  iron polysaccharides (NIFEREX) 150 MG capsule TAKE 1 CAPSULE(150 MG) BY MOUTH DAILY Patient taking differently: Take 150 mg by mouth daily. 09/09/22   Kerri Perches, MD  magnesium oxide (MAG-OX) 400 (240 Mg) MG tablet Take 400 mg by mouth daily.    [provider]  methocarbamol (ROBAXIN) 500 MG tablet Take 1 tablet (500 mg total) by mouth every 8 (eight) hours as needed for muscle spasms. 11/19/22   Kerri Perches, MD  Multiple  Vitamin (MULTIVITAMIN WITH MINERALS) TABS tablet Take 1 tablet by mouth daily.    [provider]  Omega-3 Fatty Acids (FISH OIL) 1200 MG CAPS Take 1,200 mg by mouth daily.     [provider]  ondansetron (ZOFRAN) 4 MG tablet Take 1 tablet (4 mg total) by mouth every 6 (six) hours as needed for nausea or vomiting. 11/02/22   Gelene Mink, NP  pantoprazole (PROTONIX) 40 MG tablet Take 1 tablet (40 mg total) by mouth daily. 09/28/22   Vassie Loll, MD  polyethylene glycol powder [facility]) 17 GM/SCOOP powder Take two capfuls in 12 ounces of liquid daily until soft stools that are easy to pass, then continue one capful daily as needed. 10/19/22   Tiffany Kocher, PA-C  predniSONE (DELTASONE) 5 MG tablet Take 8 tablets by mouth daily x 7 days; then 7 tablets by mouth daily x 7 days; then 6 tablet by mouth daily x 7 days; then 5 tablet by mouth daily x 7 days; then 4 tablets by mouth daily x 7 days; then 3 tablet by mouth daily x 7 days; then 2 tablets by mouth x 7 days; then 1 tablet by mouth x 7 days and stop prednisone. 09/27/22   Vassie Loll, MD  Probiotic Product (PROBIOTIC PO) Take 1 tablet by mouth daily.    [provider]  Pyridoxine HCl (VITAMIN B6 PO) Take 1  tablet by mouth daily.    [provider]  Ustekinumab (STELARA Roseto) Inject 90 mg into the skin as directed. Every 8 weeks    [provider]  venlafaxine XR (EFFEXOR-XR) 37.5 MG 24 hr capsule TAKE 1 CAPSULE(37.5 MG) BY MOUTH DAILY WITH BREAKFAST Patient taking differently: Take 37.5 mg by mouth daily with breakfast. 09/09/22   Kerri Perches, MD  vitamin C (ASCORBIC ACID) 500 MG tablet Take 500 mg by mouth daily.    [provider]  Vitamin D, Ergocalciferol, (DRISDOL) 1.25 MG (50000 UNIT) CAPS capsule Take 50,000 Units by mouth once a week. 10/10/22   [provider]  methocarbamol (ROBAXIN) 500 MG tablet Take 1 tablet (500 mg total) by mouth 3 (three) times daily.  11/18/22   Kerri Perches, MD      Allergies    Feraheme [ferumoxytol], Peanut-containing drug products, Shellfish allergy, Oxycodone-acetaminophen, and Tramadol hcl    Review of Systems   Review of Systems  Physical Exam Updated Vital Signs BP 133/87 (BP Location: Right Leg)   Pulse (!) 109   Temp 98 F (36.7 C) (Oral)   Resp (!) 24   Ht 5\' 8"  (1.727 m)   Wt 81.6 kg   SpO2 98%   BMI 27.37 kg/m  Physical Exam Vitals and nursing note reviewed.  Constitutional:      General: She is not in acute distress.    Appearance: She is well-developed.  HENT:     Head: Normocephalic and atraumatic.     Mouth/Throat:     Mouth: Mucous membranes are moist.  Eyes:     General: Vision grossly intact. Gaze aligned appropriately.     Extraocular Movements: Extraocular movements intact.     Conjunctiva/sclera: Conjunctivae normal.  Cardiovascular:     Rate and Rhythm: Normal rate and regular rhythm.     Pulses: Normal pulses.     Heart sounds: Normal heart sounds, S1 normal and S2 normal. No murmur heard.    No friction rub. No gallop.  Pulmonary:     Effort: Pulmonary effort is normal. No respiratory distress.     Breath sounds: Normal breath sounds.  Abdominal:     General: Bowel sounds are normal.     Palpations: Abdomen is soft.     Tenderness: There is generalized abdominal tenderness. There is no guarding or rebound.     Hernia: No hernia is present.  Musculoskeletal:        General: No swelling.     Cervical back: Full passive range of motion without pain, normal range of motion and neck supple. No spinous process tenderness or muscular tenderness. Normal range of motion.     Right lower leg: No edema.     Left lower leg: No edema.  Skin:    General: Skin is warm and dry.     Capillary Refill: Capillary refill takes less than 2 seconds.     Findings: No ecchymosis, erythema, rash or wound.  Neurological:     General: No focal deficit present.     Mental Status: She is  alert and oriented to person, place, and time.     GCS: GCS eye subscore is 4. GCS verbal subscore is 5. GCS motor subscore is 6.     Cranial Nerves: Cranial nerves 2-12 are intact.     Sensory: Sensation is intact.     Motor: Motor function is intact.     Coordination: Coordination is intact.  Psychiatric:  Attention and Perception: Attention normal.        Mood and Affect: Mood normal.        Speech: Speech normal.        Behavior: Behavior normal.     ED Results / Procedures / Treatments   Labs (all labs ordered are listed, but only abnormal results are displayed) Labs Reviewed  COMPREHENSIVE METABOLIC PANEL - Abnormal; Notable for the following components:      Result Value   Potassium 3.2 (*)    Chloride 97 (*)    Glucose, Bld 130 (*)    Albumin 3.2 (*)    All other components within normal limits  CBC - Abnormal; Notable for the following components:   WBC 14.8 (*)    RDW 16.4 (*)    Platelets 511 (*)    All other components within normal limits  LIPASE, BLOOD  URINALYSIS, ROUTINE W REFLEX MICROSCOPIC    EKG None  Radiology CT ABDOMEN PELVIS W CONTRAST  Result Date: 11/20/2022 CLINICAL DATA:  Patient with Crohn's disease having abdominal pain around umbilicus for 1-2 weeks getting worse. Nausea. EXAM: CT ABDOMEN AND PELVIS WITH CONTRAST TECHNIQUE: Multidetector CT imaging of the abdomen and pelvis was performed using the standard protocol following bolus administration of intravenous contrast. RADIATION DOSE REDUCTION: This exam was performed according to the departmental dose-optimization program which includes automated exposure control, adjustment of the mA and/or kV according to patient size and/or use of iterative reconstruction technique. CONTRAST:  OMNIPAQUE IOHEXOL 300 MG/ML  SOLN COMPARISON:  CT abdomen and pelvis 09/17/2022 FINDINGS: Lower chest: No acute abnormality. Hepatobiliary: Cholelithiasis. Mild prominence of the intra and extrahepatic  biliary ducts due to reservoir effect. Pancreas: Unremarkable. Spleen: Unremarkable. Adrenals/Urinary Tract: Normal adrenal glands. No urinary calculi or hydronephrosis. Unremarkable bladder. Stomach/Bowel: Postoperative change at the terminal ileum with neoterminal ileum. Dilated loops of small bowel measuring up to 6.3 cm in diameter with abrupt transition point at the neoterminal ileum along a 6-7 cm segment of marked wall thickening and hyperenhancement. There is inflammatory stranding about the terminal ileum with trace free fluid. There is a small 1.5 x 1.4 cm fluid collection adjacent to the terminal ileum compatible with abscess (series 2/image 55). Decompressed colon. Stomach is within normal limits. The appendix is not visualized. Vascular/Lymphatic: No significant vascular findings are present. Enlarged central mesenteric lymph nodes measuring up to 1.4 cm (2/43) are not significantly changed from 09/17/2022 and likely reactive. Reproductive: Unremarkable. Other: No free intraperitoneal air. Musculoskeletal: No acute fracture. IMPRESSION: 1. High-grade small bowel obstruction secondary to inflammatory changes at the neoterminal ileum. 2. There is a small 1.5 cm fluid collection adjacent to the terminal ileum compatible with abscess. Electronically Signed   By: Minerva Fester M.D.   On: 11/20/2022 03:42    Procedures Procedures    Medications Ordered in ED Medications  piperacillin-tazobactam (ZOSYN) IVPB 3.375 g (has no administration in time range)  HYDROmorphone (DILAUDID) injection 1 mg (has no administration in time range)  LORazepam (ATIVAN) injection 1 mg (has no administration in time range)  sodium chloride 0.9 % bolus 500 mL (500 mLs Intravenous New Bag/Given 11/20/22 0308)  HYDROmorphone (DILAUDID) injection 1 mg (1 mg Intravenous Given 11/20/22 0308)  ondansetron (ZOFRAN) injection 4 mg (4 mg Intravenous Given 11/20/22 0308)  iohexol (OMNIPAQUE) 300 MG/ML solution 100 mL (100 mLs  Intravenous Contrast Given 11/20/22 0319)    ED Course/ Medical Decision Making/ A&P  Medical Decision Making Amount and/or Complexity of Data Reviewed External Data Reviewed: labs, radiology and notes. Labs: ordered. Decision-making details documented in ED Course. Radiology: ordered and independent interpretation performed. Decision-making details documented in ED Course. ECG/medicine tests: ordered and independent interpretation performed. Decision-making details documented in ED Course.  Risk Prescription drug management.   Differential Diagnosis considered includes, but not limited to: Cholelithiasis; cholecystitis; cholangitis; bowel obstruction; Crohn's exacerbation  Patient with history of Crohn's disease presents to the emergency department with complaints of progressively worsening abdominal pain over period of 2 weeks.  Patient reports prior bowel obstruction and surgery secondary to Crohn's.  Patient with abdominal distention, decreased bowel sounds.  She has had nausea but no vomiting.  Blood work with leukocytosis, no other significant abnormalities.  Patient underwent CT scan to further evaluate.  Patient has a high-grade obstruction due to inflammatory changes at the terminal ileum with a very small, 1.5 cm focus of fluid that is likely abscess.  Patient discussed extensively with Dr. Algis Greenhouse (general surgery) as well as Dr. Katrinka Blazing (gastroenterology) who have reviewed patient's history and imaging.  It is recommended that the patient be initiated on antibiotics and transferred to tertiary care center.  She has refractory Crohn's disease (failed Humira and Remicade, currently on steroid taper with induction of Stelara) and limited bowel for surgical intervention if needed.  Discussed with Dr. Les Pou, [facility].  Patient accepted for transfer for further care.  Patient stable for transfer.Marland Kitchen   CRITICAL  CARE Performed by: Gilda Crease   Total critical care time: 35 minutes  Critical care time was exclusive of separately billable procedures and treating other patients.  Critical care was necessary to treat or prevent imminent or life-threatening deterioration.  Critical care was time spent personally by me on the following activities: development of treatment plan with patient and/or surrogate as well as nursing, discussions with consultants, evaluation of patient's response to treatment, examination of patient, obtaining history from patient or surrogate, ordering and performing treatments and interventions, ordering and review of laboratory studies, ordering and review of radiographic studies, pulse oximetry and re-evaluation of patient's condition.         Final Clinical Impression(s) / ED Diagnoses Final diagnoses:  SBO (small bowel obstruction) (HCC)  Crohn's disease of both small and large intestine with intestinal obstruction [facility])    Rx / DC Orders ED Discharge Orders     None         Mattie Novosel, Canary Brim, MD 11/20/22 [phone]

## 2022-11-20 NOTE — ED Notes (Signed)
Pt did not obtain sample.

## 2022-11-20 NOTE — Progress Notes (Signed)
[facility]  Patient with high grade SBO in setting Crohn's with perforation/ fistula disease with small abscess and recent flare. Follows with GI here.  In setting of Crohn's, unless patient is emergently sick, peritoneal, I do not think they should have surgery at a community / rural facility like [facility] and should be at a place with more experience with Crohn's, potentially tertiary care where they can manage in multi disciplinary setting.  Discussed with Dr. Oletta Cohn and advised he needs to speak with her GI team here to decide what their opinion is and preference or if they feel like she has more medical options or if they would even feel comfortable given this abscess.  Npo, NG  Algis Greenhouse, MD

## 2022-11-20 NOTE — Progress Notes (Signed)
I was reached overnight by the ED physician. Patient presented worsening abdominal pain and recurrent bowel obstruction symptoms.  She was found to have presence of a collection in the terminal ileum area with presence of severe inflammatory changes in the neoterminal ileum.  Given these abnormalities, she will benefit from antibiotic treatment and potential steroid management to decrease inflammation and avoid surgery.  However, the patient had a similar presentation recently which required a long steroid course and she was started on Stelara.  Unfortunately, I suspect that she has a high risk of failure to treatment and has a high risk of needing surgical intervention.  After discussion with the ED physician and Dr. Henreitta Leber, patient was requested to be transferred to a tertiary center.

## 2022-11-20 NOTE — ED Notes (Signed)
Report given to Chrissy with [employer].

## 2022-11-20 NOTE — ED Notes (Signed)
EDP at bedside at discharge

## 2022-11-21 DIAGNOSIS — K56600 Partial intestinal obstruction, unspecified as to cause: Secondary | ICD-10-CM | POA: Diagnosis not present

## 2022-11-21 DIAGNOSIS — K56609 Unspecified intestinal obstruction, unspecified as to partial versus complete obstruction: Secondary | ICD-10-CM | POA: Diagnosis not present

## 2022-11-21 DIAGNOSIS — K50914 Crohn's disease, unspecified, with abscess: Secondary | ICD-10-CM | POA: Diagnosis not present

## 2022-11-21 DIAGNOSIS — K50014 Crohn's disease of small intestine with abscess: Secondary | ICD-10-CM | POA: Diagnosis not present

## 2022-11-22 DIAGNOSIS — K9131 Postprocedural partial intestinal obstruction: Secondary | ICD-10-CM | POA: Diagnosis not present

## 2022-11-22 DIAGNOSIS — Z9049 Acquired absence of other specified parts of digestive tract: Secondary | ICD-10-CM | POA: Diagnosis not present

## 2022-11-22 DIAGNOSIS — K50814 Crohn's disease of both small and large intestine with abscess: Secondary | ICD-10-CM | POA: Diagnosis not present

## 2022-11-23 ENCOUNTER — Ambulatory Visit: Payer: Medicare HMO

## 2022-11-25 DIAGNOSIS — K50914 Crohn's disease, unspecified, with abscess: Secondary | ICD-10-CM | POA: Diagnosis not present

## 2022-11-25 DIAGNOSIS — G8929 Other chronic pain: Secondary | ICD-10-CM | POA: Diagnosis not present

## 2022-11-25 DIAGNOSIS — D638 Anemia in other chronic diseases classified elsewhere: Secondary | ICD-10-CM | POA: Diagnosis not present

## 2022-11-25 DIAGNOSIS — F418 Other specified anxiety disorders: Secondary | ICD-10-CM | POA: Diagnosis not present

## 2022-11-25 DIAGNOSIS — M549 Dorsalgia, unspecified: Secondary | ICD-10-CM | POA: Diagnosis not present

## 2022-11-25 DIAGNOSIS — K56600 Partial intestinal obstruction, unspecified as to cause: Secondary | ICD-10-CM | POA: Diagnosis not present

## 2022-11-25 DIAGNOSIS — F319 Bipolar disorder, unspecified: Secondary | ICD-10-CM | POA: Diagnosis not present

## 2022-11-25 DIAGNOSIS — M542 Cervicalgia: Secondary | ICD-10-CM | POA: Diagnosis not present

## 2022-11-25 DIAGNOSIS — K219 Gastro-esophageal reflux disease without esophagitis: Secondary | ICD-10-CM | POA: Diagnosis not present

## 2022-11-28 ENCOUNTER — Ambulatory Visit: Payer: Medicare HMO

## 2022-11-29 ENCOUNTER — Ambulatory Visit: Payer: Medicare HMO | Admitting: Gastroenterology

## 2022-11-30 ENCOUNTER — Ambulatory Visit: Payer: Medicare HMO

## 2022-11-30 ENCOUNTER — Telehealth: Payer: Self-pay

## 2022-11-30 NOTE — Telephone Encounter (Signed)
No show. Called pt who said that she wanted to cancel this week due to being in the hospital. Still not feeling better. Would like to remove her from the rest of the schedule until she is cleared by the doctor to continue physical therapy.

## 2022-12-01 ENCOUNTER — Encounter (HOSPITAL_COMMUNITY): Payer: Self-pay | Admitting: Emergency Medicine

## 2022-12-01 ENCOUNTER — Emergency Department (HOSPITAL_COMMUNITY)
Admission: EM | Admit: 2022-12-01 | Discharge: 2022-12-05 | Disposition: A | Discharge status: Home / Self Care | Payer: Medicare HMO | Attending: Emergency Medicine | Admitting: Emergency Medicine

## 2022-12-01 ENCOUNTER — Other Ambulatory Visit: Payer: Self-pay

## 2022-12-01 ENCOUNTER — Emergency Department (HOSPITAL_COMMUNITY): Payer: Medicare HMO

## 2022-12-01 DIAGNOSIS — K50012 Crohn's disease of small intestine with intestinal obstruction: Secondary | ICD-10-CM | POA: Diagnosis not present

## 2022-12-01 DIAGNOSIS — Z91010 Allergy to peanuts: Secondary | ICD-10-CM | POA: Diagnosis not present

## 2022-12-01 DIAGNOSIS — K50112 Crohn's disease of large intestine with intestinal obstruction: Secondary | ICD-10-CM | POA: Diagnosis not present

## 2022-12-01 DIAGNOSIS — K56609 Unspecified intestinal obstruction, unspecified as to partial versus complete obstruction: Secondary | ICD-10-CM | POA: Diagnosis not present

## 2022-12-01 DIAGNOSIS — R109 Unspecified abdominal pain: Secondary | ICD-10-CM | POA: Diagnosis present

## 2022-12-01 DIAGNOSIS — R1010 Upper abdominal pain, unspecified: Secondary | ICD-10-CM | POA: Diagnosis not present

## 2022-12-01 DIAGNOSIS — K5090 Crohn's disease, unspecified, without complications: Secondary | ICD-10-CM | POA: Diagnosis not present

## 2022-12-01 DIAGNOSIS — Z4682 Encounter for fitting and adjustment of non-vascular catheter: Secondary | ICD-10-CM | POA: Diagnosis not present

## 2022-12-01 DIAGNOSIS — R0989 Other specified symptoms and signs involving the circulatory and respiratory systems: Secondary | ICD-10-CM | POA: Diagnosis not present

## 2022-12-01 DIAGNOSIS — N281 Cyst of kidney, acquired: Secondary | ICD-10-CM | POA: Diagnosis not present

## 2022-12-01 DIAGNOSIS — E876 Hypokalemia: Secondary | ICD-10-CM | POA: Diagnosis not present

## 2022-12-01 DIAGNOSIS — R918 Other nonspecific abnormal finding of lung field: Secondary | ICD-10-CM | POA: Diagnosis not present

## 2022-12-01 LAB — CBC
HCT: 38.9 % (ref 36.0–46.0)
Hemoglobin: 12.1 g/dL (ref 12.0–15.0)
MCH: 27.1 pg (ref 26.0–34.0)
MCHC: 31.1 g/dL (ref 30.0–36.0)
MCV: 87.2 fL (ref 80.0–100.0)
Platelets: 609 10*3/uL — ABNORMAL HIGH (ref 150–400)
RBC: 4.46 MIL/uL (ref 3.87–5.11)
RDW: 16 % — ABNORMAL HIGH (ref 11.5–15.5)
WBC: 14 10*3/uL — ABNORMAL HIGH (ref 4.0–10.5)
nRBC: 0.1 % (ref 0.0–0.2)

## 2022-12-01 LAB — URINALYSIS, ROUTINE W REFLEX MICROSCOPIC
Bacteria, UA: NONE SEEN
Bilirubin Urine: NEGATIVE
Glucose, UA: NEGATIVE mg/dL
Hgb urine dipstick: NEGATIVE
Ketones, ur: 80 mg/dL — AB
Leukocytes,Ua: NEGATIVE
Nitrite: NEGATIVE
Protein, ur: 30 mg/dL — AB
Specific Gravity, Urine: 1.016 (ref 1.005–1.030)
pH: 6 (ref 5.0–8.0)

## 2022-12-01 LAB — COMPREHENSIVE METABOLIC PANEL
ALT: 17 U/L (ref 0–44)
AST: 20 U/L (ref 15–41)
Albumin: 3.2 g/dL — ABNORMAL LOW (ref 3.5–5.0)
Alkaline Phosphatase: 70 U/L (ref 38–126)
Anion gap: 13 (ref 5–15)
BUN: <5 mg/dL — ABNORMAL LOW (ref 6–20)
CO2: 30 mmol/L (ref 22–32)
Calcium: 8.5 mg/dL — ABNORMAL LOW (ref 8.9–10.3)
Chloride: 93 mmol/L — ABNORMAL LOW (ref 98–111)
Creatinine, Ser: 0.65 mg/dL (ref 0.44–1.00)
GFR, Estimated: >60 mL/min (ref >60)
Glucose, Bld: 105 mg/dL — ABNORMAL HIGH (ref 70–99)
Potassium: 2.4 mmol/L — CL (ref 3.5–5.1)
Sodium: 136 mmol/L (ref 135–145)
Total Bilirubin: 0.6 mg/dL (ref 0.3–1.2)
Total Protein: 7.3 g/dL (ref 6.5–8.1)

## 2022-12-01 LAB — BASIC METABOLIC PANEL
Anion gap: 9 (ref 5–15)
BUN: <5 mg/dL — ABNORMAL LOW (ref 6–20)
CO2: 27 mmol/L (ref 22–32)
Calcium: 8 mg/dL — ABNORMAL LOW (ref 8.9–10.3)
Chloride: 100 mmol/L (ref 98–111)
Creatinine, Ser: 0.6 mg/dL (ref 0.44–1.00)
GFR, Estimated: >60 mL/min (ref >60)
Glucose, Bld: 102 mg/dL — ABNORMAL HIGH (ref 70–99)
Potassium: 2.7 mmol/L — CL (ref 3.5–5.1)
Sodium: 136 mmol/L (ref 135–145)

## 2022-12-01 LAB — LIPASE, BLOOD: Lipase: 34 U/L (ref 11–51)

## 2022-12-01 LAB — LACTIC ACID, PLASMA: Lactic Acid, Venous: 0.8 mmol/L (ref 0.5–1.9)

## 2022-12-01 LAB — MAGNESIUM: Magnesium: 1.8 mg/dL (ref 1.7–2.4)

## 2022-12-01 MED ORDER — POTASSIUM CHLORIDE IN NACL 40-0.9 MEQ/L-% IV SOLN
INTRAVENOUS | Status: AC
  Filled 2022-12-01: qty 1000

## 2022-12-01 MED ORDER — MAGNESIUM SULFATE IN D5W 1-5 GM/100ML-% IV SOLN
1.0000 g | Freq: Once | INTRAVENOUS | Status: AC
  Administered 2022-12-01: 1 g via INTRAVENOUS
  Filled 2022-12-01: qty 100

## 2022-12-01 MED ORDER — PANTOPRAZOLE SODIUM 40 MG IV SOLR
40.0000 mg | INTRAVENOUS | Status: DC
  Administered 2022-12-01 – 2022-12-04 (×4): 40 mg via INTRAVENOUS
  Filled 2022-12-01 (×4): qty 10

## 2022-12-01 MED ORDER — POTASSIUM CHLORIDE 10 MEQ/100ML IV SOLN
10.0000 meq | INTRAVENOUS | Status: AC
  Administered 2022-12-01 (×3): 10 meq via INTRAVENOUS
  Filled 2022-12-01: qty 100

## 2022-12-01 MED ORDER — CIPROFLOXACIN IN D5W 400 MG/200ML IV SOLN
400.0000 mg | Freq: Two times a day (BID) | INTRAVENOUS | Status: DC
  Administered 2022-12-01 – 2022-12-05 (×8): 400 mg via INTRAVENOUS
  Filled 2022-12-01 (×8): qty 200

## 2022-12-01 MED ORDER — DIPHENHYDRAMINE HCL 50 MG/ML IJ SOLN
25.0000 mg | Freq: Once | INTRAMUSCULAR | Status: AC
  Administered 2022-12-01: 25 mg via INTRAVENOUS
  Filled 2022-12-01: qty 1

## 2022-12-01 MED ORDER — LIDOCAINE VISCOUS HCL 2 % MT SOLN
15.0000 mL | Freq: Once | OROMUCOSAL | Status: AC
  Administered 2022-12-01: 15 mL via OROMUCOSAL
  Filled 2022-12-01: qty 15

## 2022-12-01 MED ORDER — POTASSIUM CHLORIDE IN NACL 40-0.9 MEQ/L-% IV SOLN
INTRAVENOUS | Status: DC
Start: 2022-12-01 — End: 2022-12-01

## 2022-12-01 MED ORDER — MORPHINE SULFATE (PF) 4 MG/ML IV SOLN
4.0000 mg | Freq: Once | INTRAVENOUS | Status: AC
  Administered 2022-12-01: 4 mg via INTRAVENOUS
  Filled 2022-12-01: qty 1

## 2022-12-01 MED ORDER — IOHEXOL 300 MG/ML  SOLN
100.0000 mL | Freq: Once | INTRAMUSCULAR | Status: AC | PRN
  Administered 2022-12-01: 100 mL via INTRAVENOUS

## 2022-12-01 MED ORDER — METRONIDAZOLE 500 MG/100ML IV SOLN
500.0000 mg | Freq: Two times a day (BID) | INTRAVENOUS | Status: DC
  Administered 2022-12-01 – 2022-12-05 (×8): 500 mg via INTRAVENOUS
  Filled 2022-12-01 (×8): qty 100

## 2022-12-01 MED ORDER — LACTATED RINGERS IV BOLUS
1000.0000 mL | Freq: Once | INTRAVENOUS | Status: AC
  Administered 2022-12-01: 1000 mL via INTRAVENOUS

## 2022-12-01 MED ORDER — LACTATED RINGERS IV SOLN: INTRAVENOUS | Status: DC
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
## 2022-12-01 NOTE — ED Notes (Signed)
O2 desat after morphine Placed pt on 4LPM O2 via [place]

## 2022-12-01 NOTE — ED Provider Notes (Signed)
[place] EMERGENCY DEPARTMENT AT [facility] Provider Note   CSN: [id number] Arrival date & time: 12/01/22  5284     History  Chief Complaint  Patient presents with  . Abdominal Pain    [name] is a 52 y.o. female.   Abdominal Pain   52 year old female presents emergency department with complaints of abdominal pain, nausea.  Patient states that she has been having abdominal pain for the past month or so.  Was recently admitted on 11/20/2022 and discharged on 11/28/2022 for concerns for SBO/colitis with evidence of abscess.  Patient states she was feeling well upon discharge per symptoms returned over the past few days.  Reports feelings of nausea with no emesis.  Does report decreased p.o. intake since symptom onset only able to manage few bites of rice as well as chicken but mainly has been drinking water.  Patient reports last bowel movement this morning.  Denies any urinary symptoms, change in bowel habits.  Reports pain in upper middle abdomen without radiation.  Past medical history significant for Crohn's disease, bipolar disorder, GERD, OSA, iron deficiency anemia  Home Medications Prior to Admission medications   Medication Sig Start Date End Date Taking? Authorizing Provider  calcium-vitamin D (OSCAL WITH D) 500-200 MG-UNIT tablet Take 1 tablet by mouth daily.   Yes [provider]  hydrOXYzine (ATARAX) 50 MG tablet TAKE 2 TABLETS BY MOUTH AT BEDTIME FOR SLEEP 11/17/22  Yes Kerri Perches, MD  magnesium oxide (MAG-OX) 400 (240 Mg) MG tablet Take 400 mg by mouth daily.   Yes [provider]  methocarbamol (ROBAXIN) 500 MG tablet Take 1 tablet (500 mg total) by mouth every 8 (eight) hours as needed for muscle spasms. 11/19/22  Yes Kerri Perches, MD  Multiple Vitamin (MULTIVITAMIN WITH MINERALS) TABS tablet Take 1 tablet by mouth daily.   Yes [provider]  Omega-3 Fatty Acids (FISH OIL) 1200 MG CAPS Take 1,200 mg by mouth  daily.    Yes [provider]  ondansetron (ZOFRAN-ODT) 4 MG disintegrating tablet Take 4 mg by mouth every 6 (six) hours as needed for vomiting or nausea. 11/28/22 12/05/22 Yes [provider]  pantoprazole (PROTONIX) 40 MG tablet Take 1 tablet (40 mg total) by mouth daily. 09/28/22  Yes Vassie Loll, MD  Probiotic Product (PROBIOTIC PO) Take 1 tablet by mouth daily.   Yes [provider]  Pyridoxine HCl (VITAMIN B6 PO) Take 1 tablet by mouth daily.   Yes [provider]  Ustekinumab (STELARA Pickett) Inject 90 mg into the skin as directed. Every 8 weeks   Yes [provider]  venlafaxine XR (EFFEXOR-XR) 37.5 MG 24 hr capsule TAKE 1 CAPSULE(37.5 MG) BY MOUTH DAILY WITH BREAKFAST Patient taking differently: Take 37.5 mg by mouth daily with breakfast. 09/09/22  Yes Kerri Perches, MD  vitamin C (ASCORBIC ACID) 500 MG tablet Take 500 mg by mouth daily.   Yes [provider]  Vitamin D, Ergocalciferol, (DRISDOL) 1.25 MG (50000 UNIT) CAPS capsule Take 50,000 Units by mouth once a week. 10/10/22  Yes [provider]  methocarbamol (ROBAXIN) 500 MG tablet Take 1 tablet (500 mg total) by mouth 3 (three) times daily. 11/18/22   Kerri Perches, MD      Allergies    Ferumoxytol, Other, Peanut-containing drug products, Shellfish allergy, and Oxycodone-acetaminophen    Review of Systems   Review of Systems  Gastrointestinal:  Positive for abdominal pain.  All other systems reviewed and  are negative.   Physical Exam Updated Vital Signs BP 125/69   Pulse 78   Temp 98.1 F (36.7 C)   Resp 15   Ht 5' 7.5" (1.715 m)   Wt 81.6 kg   SpO2 98%   BMI 27.78 kg/m  Physical Exam Vitals and nursing note reviewed.  Constitutional:      General: She is not in acute distress.    Appearance: She is well-developed.  HENT:     Head: Normocephalic and atraumatic.  Eyes:     Conjunctiva/sclera: Conjunctivae normal.  Cardiovascular:     Rate and  Rhythm: Normal rate and regular rhythm.     Heart sounds: No murmur heard. Pulmonary:     Effort: Pulmonary effort is normal. No respiratory distress.     Breath sounds: Normal breath sounds.  Abdominal:     Palpations: Abdomen is soft.     Tenderness: There is abdominal tenderness in the epigastric area and periumbilical area. There is no right CVA tenderness or left CVA tenderness.  Musculoskeletal:        General: No swelling.     Cervical back: Neck supple.  Skin:    General: Skin is warm and dry.     Capillary Refill: Capillary refill takes less than 2 seconds.  Neurological:     Mental Status: She is alert.  Psychiatric:        Mood and Affect: Mood normal.     ED Results / Procedures / Treatments   Labs (all labs ordered are listed, but only abnormal results are displayed) Labs Reviewed  COMPREHENSIVE METABOLIC PANEL - Abnormal; Notable for the following components:      Result Value   Potassium 2.4 (*)    Chloride 93 (*)    Glucose, Bld 105 (*)    BUN <5 (*)    Calcium 8.5 (*)    Albumin 3.2 (*)    All other components within normal limits  CBC - Abnormal; Notable for the following components:   WBC 14.0 (*)    RDW 16.0 (*)    Platelets 609 (*)    All other components within normal limits  URINALYSIS, ROUTINE W REFLEX MICROSCOPIC - Abnormal; Notable for the following components:   Ketones, ur 80 (*)    Protein, ur 30 (*)    Crystals PRESENT (*)    All other components within normal limits  CBC - Abnormal; Notable for the following components:   WBC 10.9 (*)    Hemoglobin 11.0 (*)    HCT 34.6 (*)    RDW 16.6 (*)    Platelets 476 (*)    All other components within normal limits  BASIC METABOLIC PANEL - Abnormal; Notable for the following components:   Potassium 3.3 (*)    BUN <5 (*)    Calcium 8.0 (*)    All other components within normal limits  BASIC METABOLIC PANEL - Abnormal; Notable for the following components:   Potassium 2.7 (*)    Glucose, Bld  102 (*)    BUN <5 (*)    Calcium 8.0 (*)    All other components within normal limits  LIPASE, BLOOD  MAGNESIUM  LACTIC ACID, PLASMA    EKG EKG Interpretation Date/Time:  Thursday December 01 2022 10:41:20 EDT Ventricular Rate:  98 PR Interval:  122 QRS Duration:  84 QT Interval:  362 QTC Calculation: 462 R Axis:   24  Text Interpretation: Sinus rhythm with Premature atrial complexes Nonspecific T wave abnormality  Abnormal ECG When compared with ECG of 02-Mar-2016 09:12, Premature atrial complexes are now Present Nonspecific T wave abnormality now evident in Lateral leads U waves noted Confirmed by Derwood Kaplan [phone]) on 12/01/2022 3:14:45 PM  Radiology CT ABDOMEN PELVIS W CONTRAST  Result Date: 12/01/2022 CLINICAL DATA:  Upper abdominal pain for 1 month. History of Crohn's disease. EXAM: CT ABDOMEN AND PELVIS WITH CONTRAST TECHNIQUE: Multidetector CT imaging of the abdomen and pelvis was performed using the standard protocol following bolus administration of intravenous contrast. RADIATION DOSE REDUCTION: This exam was performed according to the departmental dose-optimization program which includes automated exposure control, adjustment of the mA and/or kV according to patient size and/or use of iterative reconstruction technique. CONTRAST:  OMNIPAQUE IOHEXOL 300 MG/ML  SOLN COMPARISON:  November 20, 2022. FINDINGS: Lower chest: No acute abnormality. Hepatobiliary: No focal liver abnormality is seen. Status post cholecystectomy. No biliary dilatation. Pancreas: Unremarkable. No pancreatic ductal dilatation or surrounding inflammatory changes. Spleen: Normal in size without focal abnormality. Adrenals/Urinary Tract: Adrenal glands appear normal. Right renal cyst is noted fourth no further follow-up is required. No hydronephrosis or renal obstruction is noted. Urinary bladder is unremarkable. Stomach/Bowel: Stomach is unremarkable. Postsurgical changes are seen involving the cecum  and terminal ileum there is severe distal small bowel dilatation which extends to the neoterminal ileum, where there is again noted severe inflammatory change which results in obstruction. Colon is nondilated. Vascular/Lymphatic: No significant vascular findings are present. No enlarged abdominal or pelvic lymph nodes. Reproductive: Uterus and bilateral adnexa are unremarkable. Other: No abdominal wall hernia or abnormality. No abdominopelvic ascites. Musculoskeletal: No acute or significant osseous findings. IMPRESSION: There is again noted severe distal small bowel obstruction secondary to extensive inflammation and wall thickening involving the neoterminal ileum, presumably representing acute inflammation secondary to Crohn's disease. Electronically Signed   By: Lupita Raider M.D.   On: 12/01/2022 16:05    Procedures .Critical Care  Performed by: Peter Garter, PA Authorized by: Peter Garter, PA   Critical care provider statement:    Critical care time (minutes):  81   Critical care was necessary to treat or prevent imminent or life-threatening deterioration of the following conditions: hypokalemia/sbo.   Critical care was time spent personally by me on the following activities:  Development of treatment plan with patient or surrogate, discussions with consultants, evaluation of patient's response to treatment, examination of patient, ordering and review of laboratory studies, ordering and review of radiographic studies, ordering and performing treatments and interventions, pulse oximetry, re-evaluation of patient's condition and review of old charts   I assumed direction of critical care for this patient from another provider in my specialty: no     Care discussed with: admitting provider and accepting provider at another facility       Medications Ordered in ED Medications  0.9 % NaCl with KCl 40 mEq / L  infusion (0 mL/hr Intravenous Stopped 12/02/22 0820)  metroNIDAZOLE (FLAGYL)  IVPB 500 mg (500 mg Intravenous New Bag/Given 12/02/22 0915)  ciprofloxacin (CIPRO) IVPB 400 mg (400 mg Intravenous New Bag/Given 12/02/22 0911)  pantoprazole (PROTONIX) injection 40 mg (40 mg Intravenous Given 12/01/22 2241)  potassium chloride 10 mEq in 100 mL IVPB (10 mEq Intravenous Not Given 12/02/22 0751)  0.9 % NaCl with KCl 40 mEq / L  infusion ( Intravenous New Bag/Given 12/02/22 0911)  potassium chloride 10 mEq in 100 mL IVPB (0 mEq Intravenous Stopped 12/01/22 1609)  lactated ringers bolus 1,000 mL (0 mLs  Intravenous Stopped 12/01/22 1609)  iohexol (OMNIPAQUE) 300 MG/ML solution 100 mL (100 mLs Intravenous Contrast Given 12/01/22 1334)  morphine (PF) 4 MG/ML injection 4 mg (4 mg Intravenous Given 12/01/22 1507)  diphenhydrAMINE (BENADRYL) injection 25 mg (25 mg Intravenous Given 12/01/22 1507)  potassium chloride 10 mEq in 100 mL IVPB (0 mEq Intravenous Stopped 12/01/22 2132)  magnesium sulfate IVPB 1 g 100 mL (0 g Intravenous Stopped 12/01/22 2132)  morphine (PF) 4 MG/ML injection 4 mg (4 mg Intravenous Given 12/01/22 2008)  lidocaine (XYLOCAINE) 2 % viscous mouth solution 15 mL (15 mLs Mouth/Throat Given 12/01/22 2052)  morphine (PF) 4 MG/ML injection 4 mg (4 mg Intravenous Given 12/02/22 0051)  ondansetron (ZOFRAN) injection 4 mg (4 mg Intravenous Given 12/02/22 0143)  diphenhydrAMINE (BENADRYL) injection 25 mg (25 mg Intravenous Given 12/02/22 0236)  morphine (PF) 4 MG/ML injection 4 mg (4 mg Intravenous Given 12/02/22 0556)  ondansetron (ZOFRAN) injection 4 mg (4 mg Intravenous Given 12/02/22 4540)    ED Course/ Medical Decision Making/ A&P Clinical Course as of 12/02/22 1855  Thu Dec 01, 2022  1627 Consulted general surgery Dr. Robyne Peers regarding the patient who recommended either transfer to [facility] or [facility] for surgical intervention. [CR]  [facility] had no beds available.  Will try [facility] general surgery [CR]  1818 Discussed callback from [facility] transfer center who talked to the  medical director Dr. Milbert Coulter who declined transfer due to capacity. [CR]  1828 Consulted Dr. Robyne Peers again regarding the patient who recommended admission given every the hospital has declined.  Will insert NG tube and consult GI as well as hospitalist for admission. [CR]  1909 Consulted hospitalist Elgergawy regarding the patient who declined admission.  Will keep patient in the ED and on the transfer list to [facility] [place].  Patient still adamantly declining transfer to [facility]. [CR]  Fri Dec 02, 2022  [facility] so declining any surgical transfer at this time.  Awaiting response from [facility] for possible transfer. [CR]  [facility] as well as [facility] both declined transfer.  Recommended calling back in 24 hours to see if "things have changed."  Patient still adamantly declining transfer to [facility]. [CR]    Clinical Course User Index [CR] Peter Garter, PA                                 Medical Decision Making Amount and/or Complexity of Data Reviewed Labs: ordered. Radiology: ordered.  Risk Prescription drug management. Decision regarding hospitalization.   This patient presents to the ED for concern of abdominal pain, this involves an extensive number of treatment options, and is a complaint that carries with it a high risk of complications and morbidity.  The differential diagnosis includes gastritis, PUD, SBO/LBO, abscess, Crohn's, pyelonephritis, nephrolithiasis, cystitis, ovarian torsion, other   Co morbidities that complicate the patient evaluation  See HPI   Additional history obtained:  Additional history obtained from EMR External records from outside source obtained and reviewed including hospital records   Lab Tests:  I Ordered, and personally interpreted labs.  The pertinent results include: Hypokalemia with potassium 2.4, hypochloremia of 93, hypocalcemia 5.5.  No transaminitis.  No renal dysfunction.  Leukocytosis of 14, thrombocytosis of  600, no evidence of anemia.  UA with 80 ketones, 30 proteins otherwise unremarkable.  Lactic acid normal.   Imaging Studies ordered:  I ordered imaging studies including CT  abdomen pelvis I independently visualized and interpreted imaging which showed severe distal small bowel secondary to extensive inflammatory wall thickening of the neoterminal ileum. I agree with the radiologist interpretation   Cardiac Monitoring: / EKG:  The patient was maintained on a cardiac monitor.  I personally viewed and interpreted the cardiac monitored which showed an underlying rhythm of: Sinus rhythm T waves.  Nonspecific T wave changes.  PAC   Consultations Obtained:  See ED course  Problem List / ED Course / Critical interventions / Medication management  SBO, Crohn's colitis, hypokalemia I ordered medication including morphine, Benadryl, potassium chloride, lactated Ringer's  Reevaluation of the patient after these medicines showed that the patient improved I have reviewed the patients home medicines and have made adjustments as needed   Social Determinants of Health:  Former cigarette use.  Denies illicit substance use.   Test / Admission - Considered:  SBO, Crohn's colitis, hypokalemia Vitals signs significant for hypertension initially blood pressure was 60/89. Otherwise within normal range and stable throughout visit. Laboratory/imaging studies significant for: See above 52 year old female presents emerged department with complaints of continued abdominal pain.  Patient was recently admitted in the [facility] system for similar episode on the 15th of this month.  Patient with recurrence of symptoms earlier this week.  On exam, patient with tenderness mainly in upper abdomen.  CT study again showed severe SBO with inflammatory changes of neoterminal ileum consistent with Crohn's colitis.  Discussion was had multiple times with patient about returning to [facility] given that she was  recently admitted there and seen by general surgery and her GI specialist is through the same health network.  She continued to adamantly decline going back to [facility] in favor of another hospital facility.  Consulted general surgery here at [facility] who agreed that patient is not surgical candidate at this facility but would be suited better at tertiary facility.  Consulted both [facility] as well as [facility] who declined transfer.  Again discussed with patient multiple times regarding possibility of being transferred to [facility] for surgical intervention but she continued to decline.  Hospitalist here declined for admission.  Patient to be placed on transfer list at both [facility] as well as [facility].  Potassium replenished while in the ED as well as NG tube placed and patient to be kept n.p.o.  Pending transfer.         Final Clinical Impression(s) / ED Diagnoses Final diagnoses:  SBO (small bowel obstruction) (HCC)  Crohn's colitis, with intestinal obstruction (HCC)  Hypokalemia    Rx / DC Orders ED Discharge Orders     None         Peter Garter, [place] 12/02/22 1003    Derwood Kaplan, MD 12/02/22 1525

## 2022-12-01 NOTE — Progress Notes (Addendum)
[facility]  Called by ED provider regarding this patient.  She was recently seen here on 9/15 with high-grade small bowel obstruction secondary to active Crohn's flare.  At that time, case was discussed with both general surgery and GI, who recommended that she be transferred to a tertiary care facility for further management of her Crohn's and possible surgical intervention given her failure of medical management.  She was transferred to [facility] and managed by colorectal surgery at that time.  They wanted to try to avoid emergent management during that admission and recommended follow-up on 10/10 to discuss ileostomy.  She was discharged home on 9/23.    She is now re-presenting to the emergency department with the same symptoms that she had on 9/15.  She underwent a CT of the abdomen and pelvis which is again showing a high-grade small bowel obstruction secondary to an active Crohn's flare.  She again has a leukocytosis of 14.  Her lactic acid is normal, and her vitals are stable.  There is no emergent need for surgical intervention at this time, and I would recommend against surgery at a community/rural facility such as [facility] if no emergent need.  I again recommend that this patient be transferred to a tertiary care facility for the likely need for surgical intervention, especially given recent failure of medical management.  Patient is stable for transfer at this time.  Would recommend transferring the patient back to [facility] since she just left their facility earlier this week.  If patient does not want to return to that facility, would recommend transfer to a different tertiary care facility for management by a multidisciplinary team with more experience with Crohn's disease.   Theophilus Kinds, DO [facility] [address] [place], [place] [address] [phone] (office)

## 2022-12-01 NOTE — ED Notes (Signed)
AC aware of needing 0.9with 40kcl

## 2022-12-01 NOTE — ED Notes (Signed)
Introduced self to pt and attached to partial monitor Labs, UA and 12 lead EKG completed in triage Pt complains of Upper center ABD pain.  Discharged Monday from [facility] for abscess, SBO and strictures.  Pt stated that they attempted to D/C Friday but stayed over the weekend due to poor oral intake of PO ABX and increase in BP  Pt complains of increase in pain  Denies Nausea and vomiting, stated that her stools are "green moosh"

## 2022-12-01 NOTE — ED Triage Notes (Signed)
Pt presents with abdominal pain, per pt d/c for [facility] on Monday 11/28/22, diagnosed with abscess in intestines, f/u 12/15/22, but pain has returned as of yesterday.

## 2022-12-01 NOTE — ED Notes (Signed)
Critical Potassium 2.4 Informed

## 2022-12-02 ENCOUNTER — Ambulatory Visit: Payer: Medicare HMO | Admitting: Professional Counselor

## 2022-12-02 ENCOUNTER — Emergency Department (HOSPITAL_COMMUNITY): Payer: Medicare HMO

## 2022-12-02 DIAGNOSIS — E876 Hypokalemia: Secondary | ICD-10-CM | POA: Diagnosis not present

## 2022-12-02 DIAGNOSIS — R918 Other nonspecific abnormal finding of lung field: Secondary | ICD-10-CM | POA: Diagnosis not present

## 2022-12-02 DIAGNOSIS — K56609 Unspecified intestinal obstruction, unspecified as to partial versus complete obstruction: Secondary | ICD-10-CM | POA: Diagnosis not present

## 2022-12-02 DIAGNOSIS — R0989 Other specified symptoms and signs involving the circulatory and respiratory systems: Secondary | ICD-10-CM | POA: Diagnosis not present

## 2022-12-02 DIAGNOSIS — Z4682 Encounter for fitting and adjustment of non-vascular catheter: Secondary | ICD-10-CM | POA: Diagnosis not present

## 2022-12-02 DIAGNOSIS — K50112 Crohn's disease of large intestine with intestinal obstruction: Secondary | ICD-10-CM | POA: Diagnosis not present

## 2022-12-02 LAB — CBC
HCT: 34.6 % — ABNORMAL LOW (ref 36.0–46.0)
Hemoglobin: 11 g/dL — ABNORMAL LOW (ref 12.0–15.0)
MCH: 27.6 pg (ref 26.0–34.0)
MCHC: 31.8 g/dL (ref 30.0–36.0)
MCV: 86.7 fL (ref 80.0–100.0)
Platelets: 476 10*3/uL — ABNORMAL HIGH (ref 150–400)
RBC: 3.99 MIL/uL (ref 3.87–5.11)
RDW: 16.6 % — ABNORMAL HIGH (ref 11.5–15.5)
WBC: 10.9 10*3/uL — ABNORMAL HIGH (ref 4.0–10.5)
nRBC: 0 % (ref 0.0–0.2)

## 2022-12-02 LAB — BASIC METABOLIC PANEL
Anion gap: 8 (ref 5–15)
BUN: <5 mg/dL — ABNORMAL LOW (ref 6–20)
CO2: 27 mmol/L (ref 22–32)
Calcium: 8 mg/dL — ABNORMAL LOW (ref 8.9–10.3)
Chloride: 102 mmol/L (ref 98–111)
Creatinine, Ser: 0.63 mg/dL (ref 0.44–1.00)
GFR, Estimated: >60 mL/min (ref >60)
Glucose, Bld: 88 mg/dL (ref 70–99)
Potassium: 3.3 mmol/L — ABNORMAL LOW (ref 3.5–5.1)
Sodium: 137 mmol/L (ref 135–145)

## 2022-12-02 MED ORDER — ONDANSETRON HCL 4 MG/2ML IJ SOLN
4.0000 mg | Freq: Four times a day (QID) | INTRAMUSCULAR | Status: DC | PRN
  Administered 2022-12-02: 4 mg via INTRAVENOUS
  Filled 2022-12-02: qty 2

## 2022-12-02 MED ORDER — POTASSIUM CHLORIDE IN NACL 40-0.9 MEQ/L-% IV SOLN
INTRAVENOUS | Status: AC
  Filled 2022-12-02: qty 1000
  Filled 2022-12-02: qty 2000

## 2022-12-02 MED ORDER — METHYLPREDNISOLONE SODIUM SUCC 125 MG IJ SOLR
60.0000 mg | INTRAMUSCULAR | Status: DC
  Administered 2022-12-02 – 2022-12-04 (×3): 60 mg via INTRAVENOUS
  Filled 2022-12-02 (×3): qty 2

## 2022-12-02 MED ORDER — DIPHENHYDRAMINE HCL 50 MG/ML IJ SOLN
INTRAMUSCULAR | Status: AC
  Filled 2022-12-02: qty 1

## 2022-12-02 MED ORDER — ONDANSETRON HCL 4 MG/2ML IJ SOLN
4.0000 mg | Freq: Once | INTRAMUSCULAR | Status: AC
  Administered 2022-12-02: 4 mg via INTRAVENOUS
  Filled 2022-12-02: qty 2

## 2022-12-02 MED ORDER — ONDANSETRON HCL 4 MG/2ML IJ SOLN
INTRAMUSCULAR | Status: AC
  Filled 2022-12-02: qty 2

## 2022-12-02 MED ORDER — MORPHINE SULFATE (PF) 4 MG/ML IV SOLN
4.0000 mg | Freq: Once | INTRAVENOUS | Status: AC
  Administered 2022-12-02: 4 mg via INTRAVENOUS
  Filled 2022-12-02: qty 1

## 2022-12-02 MED ORDER — ONDANSETRON HCL 4 MG/2ML IJ SOLN
4.0000 mg | Freq: Three times a day (TID) | INTRAMUSCULAR | Status: DC | PRN
  Administered 2022-12-03 – 2022-12-05 (×6): 4 mg via INTRAVENOUS
  Filled 2022-12-02 (×7): qty 2

## 2022-12-02 MED ORDER — MORPHINE SULFATE (PF) 4 MG/ML IV SOLN
4.0000 mg | INTRAVENOUS | Status: DC | PRN
  Administered 2022-12-02 – 2022-12-05 (×13): 4 mg via INTRAVENOUS
  Filled 2022-12-02 (×14): qty 1

## 2022-12-02 MED ORDER — POTASSIUM CHLORIDE 10 MEQ/100ML IV SOLN
10.0000 meq | INTRAVENOUS | Status: AC
  Administered 2022-12-02 (×3): 10 meq via INTRAVENOUS
  Filled 2022-12-02 (×4): qty 100

## 2022-12-02 MED ORDER — ONDANSETRON HCL 4 MG/2ML IJ SOLN
4.0000 mg | Freq: Once | INTRAMUSCULAR | Status: AC
  Administered 2022-12-02: 4 mg via INTRAVENOUS

## 2022-12-02 MED ORDER — DIPHENHYDRAMINE HCL 50 MG/ML IJ SOLN
25.0000 mg | Freq: Once | INTRAMUSCULAR | Status: AC
  Administered 2022-12-02: 25 mg via INTRAVENOUS

## 2022-12-02 NOTE — ED Notes (Signed)
Waiting on pharmacy to bring medication . Pharmacy called earlier.

## 2022-12-02 NOTE — Progress Notes (Signed)
PROGRESS NOTE    [name]  ONG:[id number] DOB: March 13, 1970 DOA: 12/01/2022 PCP: Kerri Perches, MD   Brief Narrative:    [name]  is a 52 y.o. female, with a history of Crohn's disease status post ileocolonic resection, GERD, bipolar disorder, OSA . -Patient presents to ED secondary to complaints of abdominal pain, nausea, patient with known history of disease, was recently admitted to [facility] from 11/20/2022, she was discharged 11/28/2022, for concern of SBO, secondary to colon colitis, with evidence of abscess, was discharged on p.o. Cipro, and Flagyl, was managed by surgical team there, with recommendation for follow-up in 2 weeks for outpatient surgical management, patient reports she was feeling fine upon discharge, but her symptoms has returned, she reports nausea, no emesis, she reports decreased oral intake, unable to tolerate few bites of rice as well chicken, mainly has been able to keep fluids, has BM this morning, no fever, no chills, no urinary symptoms.  Patient has been admitted with SBO likely secondary to Crohn's colitis and remains on IV fluids and antibiotics.  EDP working on transfer to [facility].  Assessment and Plan:   Crohn's colitis disease exacerbation Small bowel obstruction -Patient with history of recurrent obstruction secondary to Crohn's exacerbation, with inflammatory changes, worsening or establish strictures at her anastomosis,. -Multiple recent hospitalization for the same, she was just discharged from [facility] 9/23 for the same -General Surgery input greatly appreciated, does appear patient with high-grade small bowel obstruction secondary to her active Crohn's flare, CT abdomen pelvis again showing high-grade small bowel obstruction secondary to active Crohn's flare, mentation by general surgery to transfer to  tertiary care facility for management by multidisciplinary team with more experience with chron's disease, patient had  failure of medical management, so it went via appropriate to admit her to this facility as likely she will end up needing surgical intervention by experienced multidisciplinary team with chron's disease, especially with her multiple surgeries in the past, and any surgery need to be performed will need to preserve as much small intestine as possible -Will help with medical management patient while in ED awaiting acceptance by other tertiary facility which is actively being arranged by ED (patient declines to go to [facility] and [facility] at capacity at the moment) -Patient was discharged on p.o. Cipro and Flagyl, will be continued on IV form -GT inserted, remains on ILS. -Continue with IV fluids.  Hypokalemia -Replete and reevaluate in a.m.   DVT prophylaxis: SCDs Code Status: Full Family Communication: None at bedside Disposition Plan: Awaiting transfer to tertiary facility per EDP  Procedures:  None  Antimicrobials:  Anti-infectives (From admission, onward)    Start     Dose/Rate Route Frequency Ordered Stop   12/01/22 1945  metroNIDAZOLE (FLAGYL) IVPB 500 mg        500 mg 100 mL/hr over 60 Minutes Intravenous Every 12 hours 12/01/22 1939     12/01/22 1945  ciprofloxacin (CIPRO) IVPB 400 mg        400 mg 200 mL/hr over 60 Minutes Intravenous Every 12 hours 12/01/22 1939        Subjective: Patient seen and evaluated today with no new acute complaints or concerns. No acute concerns or events noted overnight.  She has minimal abdominal pain and overall appears to be feeling better with no nausea or vomiting noted.  Objective: Vitals:   12/02/22 0200 12/02/22 0300 12/02/22 0500 12/02/22 0600  BP: 131/76 127/68 (!) 134/53 (!) 145/72  Pulse: 80 81 87 83  Resp: 14 15 20 13   Temp:      TempSrc:      SpO2: 93% 94% 99% 98%  Weight:      Height:       No intake or output data in the 24 hours ending 12/02/22 0727 Filed Weights   12/01/22 1032  Weight: 81.6 kg     Examination:  General exam: Appears calm and comfortable  Respiratory system: Clear to auscultation. Respiratory effort normal.  2 L nasal cannula Cardiovascular system: S1 & S2 heard, RRR.  Gastrointestinal system: Abdomen is soft, NG tube present Central nervous system: Alert and awake Extremities: No edema Skin: No significant lesions noted Psychiatry: Flat affect.    Data Reviewed: I have personally reviewed following labs and imaging studies  CBC: Recent Labs  Lab 12/01/22 1112 12/02/22 0616  WBC 14.0* 10.9*  HGB 12.1 11.0*  HCT 38.9 34.6*  MCV 87.2 86.7  PLT 609* 476*   Basic Metabolic Panel: Recent Labs  Lab 12/01/22 1112 12/01/22 2253 12/02/22 0616  NA 136 136 137  K 2.4* 2.7* 3.3*  CL 93* 100 102  CO2 30 27 27   GLUCOSE 105* 102* 88  BUN <5* <5* <5*  CREATININE 0.65 0.60 0.63  CALCIUM 8.5* 8.0* 8.0*  MG 1.8  --   --    GFR: Estimated Creatinine Clearance: 91.3 mL/min (by C-G formula based on SCr of 0.63 mg/dL). Liver Function Tests: Recent Labs  Lab 12/01/22 1112  AST 20  ALT 17  ALKPHOS 70  BILITOT 0.6  PROT 7.3  ALBUMIN 3.2*   Recent Labs  Lab 12/01/22 1112  LIPASE 34   No results for input(s): "AMMONIA" in the last 168 hours. Coagulation Profile: No results for input(s): "INR", "PROTIME" in the last 168 hours. Cardiac Enzymes: No results for input(s): "CKTOTAL", "CKMB", "CKMBINDEX", "TROPONINI" in the last 168 hours. BNP (last 3 results) No results for input(s): "PROBNP" in the last 8760 hours. HbA1C: No results for input(s): "HGBA1C" in the last 72 hours. CBG: No results for input(s): "GLUCAP" in the last 168 hours. Lipid Profile: No results for input(s): "CHOL", "HDL", "LDLCALC", "TRIG", "CHOLHDL", "LDLDIRECT" in the last 72 hours. Thyroid Function Tests: No results for input(s): "TSH", "T4TOTAL", "FREET4", "T3FREE", "THYROIDAB" in the last 72 hours. Anemia Panel: No results for input(s): "VITAMINB12", "FOLATE",  "FERRITIN", "TIBC", "IRON", "RETICCTPCT" in the last 72 hours. Sepsis Labs: Recent Labs  Lab 12/01/22 1237  LATICACIDVEN 0.8    No results found for this or any previous visit (from the past 240 hour(s)).       Radiology Studies: CT ABDOMEN PELVIS W CONTRAST  Result Date: 12/01/2022 CLINICAL DATA:  Upper abdominal pain for 1 month. History of Crohn's disease. EXAM: CT ABDOMEN AND PELVIS WITH CONTRAST TECHNIQUE: Multidetector CT imaging of the abdomen and pelvis was performed using the standard protocol following bolus administration of intravenous contrast. RADIATION DOSE REDUCTION: This exam was performed according to the departmental dose-optimization program which includes automated exposure control, adjustment of the mA and/or kV according to patient size and/or use of iterative reconstruction technique. CONTRAST:  OMNIPAQUE IOHEXOL 300 MG/ML  SOLN COMPARISON:  November 20, 2022. FINDINGS: Lower chest: No acute abnormality. Hepatobiliary: No focal liver abnormality is seen. Status post cholecystectomy. No biliary dilatation. Pancreas: Unremarkable. No pancreatic ductal dilatation or surrounding inflammatory changes. Spleen: Normal in size without focal abnormality. Adrenals/Urinary Tract: Adrenal glands appear normal. Right renal cyst is noted fourth no further follow-up  is required. No hydronephrosis or renal obstruction is noted. Urinary bladder is unremarkable. Stomach/Bowel: Stomach is unremarkable. Postsurgical changes are seen involving the cecum and terminal ileum there is severe distal small bowel dilatation which extends to the neoterminal ileum, where there is again noted severe inflammatory change which results in obstruction. Colon is nondilated. Vascular/Lymphatic: No significant vascular findings are present. No enlarged abdominal or pelvic lymph nodes. Reproductive: Uterus and bilateral adnexa are unremarkable. Other: No abdominal wall hernia or abnormality. No  abdominopelvic ascites. Musculoskeletal: No acute or significant osseous findings. IMPRESSION: There is again noted severe distal small bowel obstruction secondary to extensive inflammation and wall thickening involving the neoterminal ileum, presumably representing acute inflammation secondary to Crohn's disease. Electronically Signed   By: Lupita Raider M.D.   On: 12/01/2022 16:05        Scheduled Meds:  pantoprazole (PROTONIX) IV  40 mg Intravenous Q24H   Continuous Infusions:  ciprofloxacin Stopped (12/01/22 2132)   metronidazole Stopped (12/01/22 2132)   potassium chloride 10 mEq (12/02/22 0433)     LOS: 0 days    Time spent: 35 minutes    Yan Pankratz D Sherryll Burger, DO [facility]  If 7PM-7AM, please contact night-coverage [email or web address] 12/02/2022, 7:27 AM
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
## 2022-12-02 NOTE — ED Notes (Signed)
Nurse called pharmacy for meds again.

## 2022-12-02 NOTE — ED Provider Notes (Signed)
Emergency Medicine Observation Re-evaluation Note  [name] is a 52 y.o. female, seen on rounds today.  Pt initially presented to the ED for complaints of Abdominal Pain Currently, the patient is awaiting transfer to either [facility] or [facility] system for surgical intervention.  Patient found with evidence of severe SBO most likely secondary to Crohn's colitis.  Patient overall feeling much better.  Minimal abdominal pain of which is responding to morphine.  Potassium being supplemented via IV.  NG tube in place.  Patient still is declining transfer to [facility] and desires to wait transfer to either [facility] or [facility].  Physical Exam  BP 125/69   Pulse 78   Temp 98.1 F (36.7 C)   Resp 15   Ht 5' 7.5" (1.715 m)   Wt 81.6 kg   SpO2 98%   BMI 27.78 kg/m  Physical Exam General: Well-appearing in no acute distress.  Patient on her phone resting during exam. Cardiac: Normal rate and regular rhythm Lungs: Lungs clear to auscultation bilaterally Abdomen: Minimal epigastric tenderness to palpation. Psych: Mood and affect normal  ED Course / MDM  EKG:EKG Interpretation Date/Time:  Thursday December 01 2022 10:41:20 EDT Ventricular Rate:  98 PR Interval:  122 QRS Duration:  84 QT Interval:  362 QTC Calculation: 462 R Axis:   24  Text Interpretation: Sinus rhythm with Premature atrial complexes Nonspecific T wave abnormality Abnormal ECG When compared with ECG of 02-Mar-2016 09:12, Premature atrial complexes are now Present Nonspecific T wave abnormality now evident in Lateral leads U waves noted Confirmed by Derwood Kaplan [phone]) on 12/01/2022 3:14:45 PM  I have reviewed the labs performed to date as well as medications administered while in observation.   Plan  Current plan is for continue to await transfer to either [facility] or [facility].  Spoke with Diplomatic Services operational officer again regarding reaching out to transfer center for both hospital facilities.  Patient still declining transfer to  [facility] for surgical intervention.  Will continue n.p.o. status as well as replenish patient's potassium and control pain.    Peter Garter, [place] 12/02/22 1006    Coral Spikes, DO 12/02/22 1534
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
## 2022-12-03 DIAGNOSIS — K50112 Crohn's disease of large intestine with intestinal obstruction: Secondary | ICD-10-CM | POA: Diagnosis not present

## 2022-12-03 DIAGNOSIS — E876 Hypokalemia: Secondary | ICD-10-CM | POA: Diagnosis not present

## 2022-12-03 DIAGNOSIS — K56609 Unspecified intestinal obstruction, unspecified as to partial versus complete obstruction: Secondary | ICD-10-CM | POA: Diagnosis not present

## 2022-12-03 LAB — BASIC METABOLIC PANEL
Anion gap: 9 (ref 5–15)
BUN: 6 mg/dL (ref 6–20)
CO2: 25 mmol/L (ref 22–32)
Calcium: 7.8 mg/dL — ABNORMAL LOW (ref 8.9–10.3)
Chloride: 104 mmol/L (ref 98–111)
Creatinine, Ser: 0.66 mg/dL (ref 0.44–1.00)
GFR, Estimated: >60 mL/min (ref >60)
Glucose, Bld: 82 mg/dL (ref 70–99)
Potassium: 3.5 mmol/L (ref 3.5–5.1)
Sodium: 138 mmol/L (ref 135–145)

## 2022-12-03 LAB — CBC
HCT: 32.1 % — ABNORMAL LOW (ref 36.0–46.0)
Hemoglobin: 10.1 g/dL — ABNORMAL LOW (ref 12.0–15.0)
MCH: 27.3 pg (ref 26.0–34.0)
MCHC: 31.5 g/dL (ref 30.0–36.0)
MCV: 86.8 fL (ref 80.0–100.0)
Platelets: 475 10*3/uL — ABNORMAL HIGH (ref 150–400)
RBC: 3.7 MIL/uL — ABNORMAL LOW (ref 3.87–5.11)
RDW: 16.8 % — ABNORMAL HIGH (ref 11.5–15.5)
WBC: 12.6 10*3/uL — ABNORMAL HIGH (ref 4.0–10.5)
nRBC: 0 % (ref 0.0–0.2)

## 2022-12-03 LAB — HEPATIC FUNCTION PANEL
ALT: 25 U/L (ref 0–44)
AST: 31 U/L (ref 15–41)
Albumin: 2.5 g/dL — ABNORMAL LOW (ref 3.5–5.0)
Alkaline Phosphatase: 70 U/L (ref 38–126)
Bilirubin, Direct: <0.1 mg/dL (ref 0.0–0.2)
Total Bilirubin: 0.3 mg/dL (ref 0.3–1.2)
Total Protein: 5.7 g/dL — ABNORMAL LOW (ref 6.5–8.1)

## 2022-12-03 LAB — MAGNESIUM: Magnesium: 1.7 mg/dL (ref 1.7–2.4)

## 2022-12-03 MED ORDER — PROMETHAZINE HCL 25 MG/ML IJ SOLN
INTRAMUSCULAR | Status: AC
  Filled 2022-12-03: qty 1

## 2022-12-03 MED ORDER — SODIUM CHLORIDE 0.9 % IV SOLN
12.5000 mg | Freq: Once | INTRAVENOUS | Status: AC
  Administered 2022-12-03: 12.5 mg via INTRAVENOUS
  Filled 2022-12-03: qty 0.5

## 2022-12-03 MED ORDER — KCL IN DEXTROSE-NACL 40-5-0.9 MEQ/L-%-% IV SOLN
INTRAVENOUS | Status: DC
  Filled 2022-12-03 (×7): qty 1000

## 2022-12-03 MED ORDER — POTASSIUM CHLORIDE IN NACL 40-0.9 MEQ/L-% IV SOLN
INTRAVENOUS | Status: DC
Start: 2022-12-03 — End: 2022-12-03

## 2022-12-03 NOTE — ED Notes (Signed)
EDP speaking with pt at [facility].

## 2022-12-03 NOTE — ED Notes (Addendum)
Update: no changes. Pt with SBO and NGT continues to hold in ED waiting for transfer to acceptable facility. Pt declined [facility]. [facility] declined pt, and [facility] not responding. Awaiting acceptance. Hospitalist team is rounding. EDP is primary.

## 2022-12-03 NOTE — ED Provider Notes (Signed)
Care of him assumed from off going provider.  This patient presented to the emergency department 2 days ago for abdominal pain and nausea.  She has a history of Crohn's disease.  She is typically on budesonide and Stelara.  Last Tory taper was in July.  On 9/15, she was diagnosed with a high-grade bowel obstruction and a small 1.5 cm focus concerning for abscess.  She was transferred to [facility] for management.  During that admission, she was treated with antibiotics and NG tube.  During hospitalization she had NG tube removed on day 3 and was tolerating clear liquid diet.  She did not require surgery.  She states that they attempted to discharge her on 9/20 but she still endorsed abdominal pain at the time.  She was kept over the weekend and discharged on Monday, 9/23.  Symptoms returned 2 days later.  She came back to the ED on 9/26.   CT imaging from 2 days ago showed redemonstration of severe distal small bowel obstruction.  This was discussed with general surgery who recommended transferred back to [facility].  Patient refused this.  [facility] and [facility] were engaged but they refused transfer.  Patient has been boarding in the ED awaiting transfer.  Yesterday, she was seen by GI and general surgery.  GI recommends transfer to tertiary center, NG tube, C. difficile study, n.p.o., antibiotics, and methylprednisolone 60 mg IV daily.  These have been ordered.  General surgery also recommends continued pursuit of transfer, NG tube, IV antibiotics, fluids. Physical Exam  BP 132/69   Pulse 80   Temp 97.9 F (36.6 C) (Oral)   Resp 14   Ht 5' 7.5" (1.715 m)   Wt 81.6 kg   SpO2 96%   BMI 27.78 kg/m   Physical Exam Vitals and nursing note reviewed.  Constitutional:      General: She is not in acute distress.    Appearance: She is well-developed. She is not ill-appearing, toxic-appearing or diaphoretic.  HENT:     Head: Normocephalic and atraumatic.     Mouth/Throat:     Mouth: Mucous membranes are moist.   Eyes:     Extraocular Movements: Extraocular movements intact.     Conjunctiva/sclera: Conjunctivae normal.  Cardiovascular:     Rate and Rhythm: Normal rate and regular rhythm.  Pulmonary:     Effort: Pulmonary effort is normal. No respiratory distress.  Abdominal:     Comments: NG tube in place with bilious contents in suction canister.  Musculoskeletal:        General: No swelling.     Cervical back: Neck supple.  Skin:    General: Skin is warm and dry.  Neurological:     General: No focal deficit present.     Mental Status: She is alert and oriented to person, place, and time.  Psychiatric:        Mood and Affect: Mood is anxious. Affect is tearful.        Speech: Speech normal.        Behavior: Behavior normal.        Thought Content: Thought content normal.     Procedures  Procedures  ED Course / MDM   Clinical Course as of 12/03/22 0806  Thu Dec 01, 2022  1627 Consulted general surgery Dr. Robyne Peers regarding the patient who recommended either transfer to [facility] or [facility] for surgical intervention. [CR]  1730 [facility] had no beds available.  Will try [facility] general surgery [CR]  1818  Discussed callback from [facility] transfer center who talked to the medical director Dr. Milbert Coulter who declined transfer due to capacity. [CR]  1828 Consulted Dr. Robyne Peers again regarding the patient who recommended admission given every the hospital has declined.  Will insert NG tube and consult GI as well as hospitalist for admission. [CR]  1909 Consulted hospitalist Elgergawy regarding the patient who declined admission.  Will keep patient in the ED and on the transfer list to [facility], [place].  Patient still adamantly declining transfer to [facility]. [CR]  Fri Dec 02, 2022  1241 [facility] so declining any surgical transfer at this time.  Awaiting response from [facility] for possible transfer. [CR]  1332 [facility] as well as [facility] both declined transfer.  Recommended calling back in  24 hours to see if "things have changed."  Patient still adamantly declining transfer to [facility]. [CR]    Clinical Course User Index [CR] Peter Garter, PA   Medical Decision Making Amount and/or Complexity of Data Reviewed Labs: ordered. Radiology: ordered.  Risk Prescription drug management.   On assessment, patient sitting on ED stretcher.  NG tube is in place.  Suction canister appears to have bilious contents, green in color.  She states that her pain is improved.  She had 2 bowel movements yesterday.  She does not think she is passing gas today.  I spoke with her about her experience at [facility].  Patient states that, because they did not perform surgery, it was a waste of her time.  It was explained to her that conservative approaches for management of SBO's are always pursued first, given risks of surgery.  From what I gather and EMR, it seems that she did improve with conservative management last week.  Patient becomes anxious and tearful when speaking about her experience at [facility].  She is still grieving the loss of her son, who died in 07/30/22 of this year.  She wants to be close by her family, who lives [place] of here.  I explained to her that [facility] and [facility] have not excepted her and that [facility] remains the best option.  She is adamant that she will not go back there.  I spoke with [facility] transfer center and updated them on patient's vital signs and today's lab work results.  They called back stating that they were refusing transfer based on capacity.  I spoke with hospitalist who did come and evaluate the patient today.  They will round on her daily while she awaits transfer.  There was no callback from [facility].  Care of patient was signed out to oncoming ED provider.       Gloris Manchester, MD 12/03/22 [phone]

## 2022-12-03 NOTE — Progress Notes (Signed)
PROGRESS NOTE    [name]  WGN:[id number] DOB: 1970/11/13 DOA: 12/01/2022 PCP: Kerri Perches, MD   Brief Narrative:    [name]  is a 52 y.o. female, with a history of Crohn's disease status post ileocolonic resection, GERD, bipolar disorder, OSA . -Patient presents to ED secondary to complaints of abdominal pain, nausea, patient with known history of disease, was recently admitted to [facility] from 11/20/2022, she was discharged 11/28/2022, for concern of SBO, secondary to colon colitis, with evidence of abscess, was discharged on p.o. Cipro, and Flagyl, was managed by surgical team there, with recommendation for follow-up in 2 weeks for outpatient surgical management, patient reports she was feeling fine upon discharge, but her symptoms has returned, she reports nausea, no emesis, she reports decreased oral intake, unable to tolerate few bites of rice as well chicken, mainly has been able to keep fluids, has BM this morning, no fever, no chills, no urinary symptoms.  Patient has been admitted with SBO likely secondary to Crohn's colitis and remains on IV fluids and antibiotics.  EDP working on transfer to [facility].  Assessment and Plan:   Crohn's colitis disease exacerbation Small bowel obstruction -Patient with history of recurrent obstruction secondary to Crohn's exacerbation, with inflammatory changes, worsening or establish strictures at her anastomosis,. -Multiple recent hospitalization for the same, she was just discharged from [facility] 9/23 for the same -General Surgery input greatly appreciated, does appear patient with high-grade small bowel obstruction secondary to her active Crohn's flare, CT abdomen pelvis again showing high-grade small bowel obstruction secondary to active Crohn's flare, mentation by general surgery to transfer to  tertiary care facility for management by multidisciplinary team with more experience with chron's disease, patient had  failure of medical management, so it went via appropriate to admit her to this facility as likely she will end up needing surgical intervention by experienced multidisciplinary team with chron's disease, especially with her multiple surgeries in the past, and any surgery need to be performed will need to preserve as much small intestine as possible -Will help with medical management patient while in ED awaiting acceptance by other tertiary facility which is actively being arranged by ED (patient declines to go to [place], [place] and [facility] at capacity at the moment) -Patient was discharged on p.o. Cipro and Flagyl, will be continued on IV form -GT inserted, remains on ILS. -Continue with IV fluids -Started on IV Solu-Medrol daily per GI  Hypokalemia -Currently improved, continue to monitor And maintain on IV fluids with D5 and potassium given ongoing GI losses   DVT prophylaxis: SCDs Code Status: Full Family Communication: None at bedside Disposition Plan: Awaiting transfer to tertiary facility per EDP  Procedures:  None  Antimicrobials:  Anti-infectives (From admission, onward)    Start     Dose/Rate Route Frequency Ordered Stop   12/01/22 1945  metroNIDAZOLE (FLAGYL) IVPB 500 mg        500 mg 100 mL/hr over 60 Minutes Intravenous Every 12 hours 12/01/22 1939     12/01/22 1945  ciprofloxacin (CIPRO) IVPB 400 mg        400 mg 200 mL/hr over 60 Minutes Intravenous Every 12 hours 12/01/22 1939        Subjective: Patient seen and evaluated today with no new acute complaints or concerns. No acute concerns or events noted overnight.  She denies abdominal pain or any nausea or vomiting.  She did have 2 bowel movements yesterday.  Objective: Vitals:  12/03/22 0830 12/03/22 0834 12/03/22 1130 12/03/22 1200  BP: 135/72 (!) 163/82 130/73 138/68  Pulse: 80 77 74 83  Resp: 16 18 14 14   Temp:  97.8 F (36.6 C)  97.6 F (36.4 C)  TempSrc:  Oral  Oral  SpO2: 96% 97% 98% 95%  Weight:       Height:       No intake or output data in the 24 hours ending 12/03/22 1227 Filed Weights   12/01/22 1032  Weight: 81.6 kg    Examination:  General exam: Appears calm and comfortable  Respiratory system: Clear to auscultation. Respiratory effort normal.  2 L nasal cannula Cardiovascular system: S1 & S2 heard, RRR.  Gastrointestinal system: Abdomen is soft, NG tube present Central nervous system: Alert and awake Extremities: No edema Skin: No significant lesions noted Psychiatry: Flat affect.    Data Reviewed: I have personally reviewed following labs and imaging studies  CBC: Recent Labs  Lab 12/01/22 1112 12/02/22 0616 12/03/22 0438  WBC 14.0* 10.9* 12.6*  HGB 12.1 11.0* 10.1*  HCT 38.9 34.6* 32.1*  MCV 87.2 86.7 86.8  PLT 609* 476* 475*   Basic Metabolic Panel: Recent Labs  Lab 12/01/22 1112 12/01/22 2253 12/02/22 0616 12/03/22 0438  NA 136 136 137 138  K 2.4* 2.7* 3.3* 3.5  CL 93* 100 102 104  CO2 30 27 27 25   GLUCOSE 105* 102* 88 82  BUN <5* <5* <5* 6  CREATININE 0.65 0.60 0.63 0.66  CALCIUM 8.5* 8.0* 8.0* 7.8*  MG 1.8  --   --  1.7   GFR: Estimated Creatinine Clearance: 91.3 mL/min (by C-G formula based on SCr of 0.66 mg/dL). Liver Function Tests: Recent Labs  Lab 12/01/22 1112 12/03/22 0832  AST 20 31  ALT 17 25  ALKPHOS 70 70  BILITOT 0.6 0.3  PROT 7.3 5.7*  ALBUMIN 3.2* 2.5*   Recent Labs  Lab 12/01/22 1112  LIPASE 34   No results for input(s): "AMMONIA" in the last 168 hours. Coagulation Profile: No results for input(s): "INR", "PROTIME" in the last 168 hours. Cardiac Enzymes: No results for input(s): "CKTOTAL", "CKMB", "CKMBINDEX", "TROPONINI" in the last 168 hours. BNP (last 3 results) No results for input(s): "PROBNP" in the last 8760 hours. HbA1C: No results for input(s): "HGBA1C" in the last 72 hours. CBG: No results for input(s): "GLUCAP" in the last 168 hours. Lipid Profile: No results for input(s): "CHOL", "HDL",  "LDLCALC", "TRIG", "CHOLHDL", "LDLDIRECT" in the last 72 hours. Thyroid Function Tests: No results for input(s): "TSH", "T4TOTAL", "FREET4", "T3FREE", "THYROIDAB" in the last 72 hours. Anemia Panel: No results for input(s): "VITAMINB12", "FOLATE", "FERRITIN", "TIBC", "IRON", "RETICCTPCT" in the last 72 hours. Sepsis Labs: Recent Labs  Lab 12/01/22 1237  LATICACIDVEN 0.8    No results found for this or any previous visit (from the past 240 hour(s)).       Radiology Studies: DG Chest Port 1 View  Result Date: 12/02/2022 CLINICAL DATA:  [id number] Encounter for orogastric (OG) tube placement [id number] EXAM: PORTABLE CHEST 1 VIEW COMPARISON:  07/04/2022. FINDINGS: Interval placement of nasogastric tube with its tip and side hole overlying the left upper quadrant, within the proximal stomach. Low lung volume. There are increased interstitial markings, which may be accentuated by low lung volume. There is a new heterogeneous opacity at the left lung base, which may represent focal pneumonitis versus atelectasis. Bilateral costophrenic angles are clear. Stable cardio-mediastinal silhouette. No acute osseous abnormalities. The soft tissues are within normal  limits. IMPRESSION: 1. Interval placement of nasogastric tube with its tip and side hole overlying the left upper quadrant, within the proximal stomach. 2. Increased interstitial markings, which may be accentuated by low lung volume. These are nonspecific and differential diagnosis includes mild pulmonary edema or atypical infection. 3. New heterogeneous opacity at the left lung base, which may represent focal pneumonitis versus atelectasis. Electronically Signed   By: Jules Schick M.D.   On: 12/02/2022 13:09   CT ABDOMEN PELVIS W CONTRAST  Result Date: 12/01/2022 CLINICAL DATA:  Upper abdominal pain for 1 month. History of Crohn's disease. EXAM: CT ABDOMEN AND PELVIS WITH CONTRAST TECHNIQUE: Multidetector CT imaging of the abdomen and pelvis was  performed using the standard protocol following bolus administration of intravenous contrast. RADIATION DOSE REDUCTION: This exam was performed according to the departmental dose-optimization program which includes automated exposure control, adjustment of the mA and/or kV according to patient size and/or use of iterative reconstruction technique. CONTRAST:  OMNIPAQUE IOHEXOL 300 MG/ML  SOLN COMPARISON:  November 20, 2022. FINDINGS: Lower chest: No acute abnormality. Hepatobiliary: No focal liver abnormality is seen. Status post cholecystectomy. No biliary dilatation. Pancreas: Unremarkable. No pancreatic ductal dilatation or surrounding inflammatory changes. Spleen: Normal in size without focal abnormality. Adrenals/Urinary Tract: Adrenal glands appear normal. Right renal cyst is noted fourth no further follow-up is required. No hydronephrosis or renal obstruction is noted. Urinary bladder is unremarkable. Stomach/Bowel: Stomach is unremarkable. Postsurgical changes are seen involving the cecum and terminal ileum there is severe distal small bowel dilatation which extends to the neoterminal ileum, where there is again noted severe inflammatory change which results in obstruction. Colon is nondilated. Vascular/Lymphatic: No significant vascular findings are present. No enlarged abdominal or pelvic lymph nodes. Reproductive: Uterus and bilateral adnexa are unremarkable. Other: No abdominal wall hernia or abnormality. No abdominopelvic ascites. Musculoskeletal: No acute or significant osseous findings. IMPRESSION: There is again noted severe distal small bowel obstruction secondary to extensive inflammation and wall thickening involving the neoterminal ileum, presumably representing acute inflammation secondary to Crohn's disease. Electronically Signed   By: Lupita Raider M.D.   On: 12/01/2022 16:05        Scheduled Meds:  methylPREDNISolone (SOLU-MEDROL) injection  60 mg Intravenous Q24H    pantoprazole (PROTONIX) IV  40 mg Intravenous Q24H   Continuous Infusions:  ciprofloxacin Stopped (12/03/22 0941)   dextrose 5 % and 0.9 % NaCl with KCl 40 mEq/L 75 mL/hr at 12/03/22 1207   metronidazole Stopped (12/03/22 0941)     LOS: 0 days    Time spent: 35 minutes    Chakira Jachim Hoover Brunette, DO [facility]  If 7PM-7AM, please contact night-coverage [email or web address] 12/03/2022, 12:27 PM

## 2022-12-03 NOTE — ED Notes (Signed)
Pt called this RN to room for discomfort and nausea Pt crying and very anxious Medicated with PRN zofran (early)  Pt talked downed Reattached to monitor, vitals listed

## 2022-12-03 NOTE — ED Notes (Signed)
Pharmacy called pertaining to fluids ordered, states they will send the correct one down

## 2022-12-03 NOTE — Progress Notes (Addendum)
Gastroenterology & Hepatology   Interval History: Patient seen and examined bedside in ER this afternoon.  Reports to have 2 bowel movement yesterday, unsure if she is passing gas.  Denies any further abdominal pain nausea or vomiting.  NG tube in place with bilious output  Inpatient Medications:  Current Facility-Administered Medications:    ciprofloxacin (CIPRO) IVPB 400 mg, 400 mg, Intravenous, Q12H, Elgergawy, Leana Roe, MD, Stopped at 12/03/22 0941   dextrose 5 % and 0.9 % NaCl with KCl 40 mEq/L infusion, , Intravenous, Continuous, Sherryll Burger, Pratik D, DO, Last Rate: 75 mL/hr at 12/03/22 1207, New Bag at 12/03/22 1207   methylPREDNISolone sodium succinate (SOLU-MEDROL) 125 mg/2 mL injection 60 mg, 60 mg, Intravenous, Q24H, Mahon, Courtney L, NP, 60 mg at 12/03/22 1539   metroNIDAZOLE (FLAGYL) IVPB 500 mg, 500 mg, Intravenous, Q12H, Elgergawy, Leana Roe, MD, Stopped at 12/03/22 0941   morphine (PF) 4 MG/ML injection 4 mg, 4 mg, Intravenous, Q5H PRN, Sherian Maroon A, PA, 4 mg at 12/03/22 1044   ondansetron (ZOFRAN) injection 4 mg, 4 mg, Intravenous, Q8H PRN, Arabella Merles, PA-C, 4 mg at 12/03/22 1257   pantoprazole (PROTONIX) injection 40 mg, 40 mg, Intravenous, Q24H, Elgergawy, Leana Roe, MD, 40 mg at 12/02/22 2218  Current Outpatient Medications:    calcium-vitamin D (OSCAL WITH D) 500-200 MG-UNIT tablet, Take 1 tablet by mouth daily., Disp: , Rfl:    hydrOXYzine (ATARAX) 50 MG tablet, TAKE 2 TABLETS BY MOUTH AT BEDTIME FOR SLEEP, Disp: 60 tablet, Rfl: 0   magnesium oxide (MAG-OX) 400 (240 Mg) MG tablet, Take 400 mg by mouth daily., Disp: , Rfl:    methocarbamol (ROBAXIN) 500 MG tablet, Take 1 tablet (500 mg total) by mouth every 8 (eight) hours as needed for muscle spasms., Disp: 30 tablet, Rfl: 1   Multiple Vitamin (MULTIVITAMIN WITH MINERALS) TABS tablet, Take 1 tablet by mouth daily., Disp: , Rfl:    Omega-3 Fatty Acids (FISH OIL) 1200 MG CAPS, Take 1,200 mg by mouth daily. , Disp: ,  Rfl:    ondansetron (ZOFRAN-ODT) 4 MG disintegrating tablet, Take 4 mg by mouth every 6 (six) hours as needed for vomiting or nausea., Disp: , Rfl:    pantoprazole (PROTONIX) 40 MG tablet, Take 1 tablet (40 mg total) by mouth daily., Disp: 30 tablet, Rfl: 1   Probiotic Product (PROBIOTIC PO), Take 1 tablet by mouth daily., Disp: , Rfl:    Pyridoxine HCl (VITAMIN B6 PO), Take 1 tablet by mouth daily., Disp: , Rfl:    Ustekinumab (STELARA [place]), Inject 90 mg into the skin as directed. Every 8 weeks, Disp: , Rfl:    venlafaxine XR (EFFEXOR-XR) 37.5 MG 24 hr capsule, TAKE 1 CAPSULE(37.5 MG) BY MOUTH DAILY WITH BREAKFAST (Patient taking differently: Take 37.5 mg by mouth daily with breakfast.), Disp: 30 capsule, Rfl: 3   vitamin C (ASCORBIC ACID) 500 MG tablet, Take 500 mg by mouth daily., Disp: , Rfl:    Vitamin D, Ergocalciferol, (DRISDOL) 1.25 MG (50000 UNIT) CAPS capsule, Take 50,000 Units by mouth once a week., Disp: , Rfl:    I/O   No intake or output data in the 24 hours ending 12/03/22 1612   Physical Exam: Temp:  [97.6 F (36.4 C)-98.3 F (36.8 C)] 98.3 F (36.8 C) (09/28 1530) Pulse Rate:  [73-103] 81 (09/28 1530) Resp:  [11-21] 14 (09/28 1530) BP: (94-163)/(54-110) 153/70 (09/28 1530) SpO2:  [85 %-98 %] 97 % (09/28 1530)  Temp (24hrs), Avg:97.9 F (36.6 C), Min:97.6  F (36.4 C), Max:98.3 F (36.8 C)  GENERAL: NG tube in place with bilious output HEENT: Head is normocephalic and atraumatic. EOMI are intact. Mouth is well hydrated and without lesions. NECK: Supple. No masses LUNGS: Clear to auscultation. No presence of rhonchi/wheezing/rales. Adequate chest expansion HEART: RRR, normal s1 and s2. ABDOMEN: Soft, nontender, no guarding, no peritoneal signs, and nondistended. BS +. No masses.   Laboratory Data: CBC:     Component Value Date/Time   WBC 12.6 (H) 12/03/2022 0438   RBC 3.70 (L) 12/03/2022 0438   HGB 10.1 (L) 12/03/2022 0438   HGB 11.5 11/02/2022 0929   HCT 32.1  (L) 12/03/2022 0438   HCT 35.7 11/02/2022 0929   PLT 475 (H) 12/03/2022 0438   PLT 549 (H) 11/02/2022 0929   MCV 86.8 12/03/2022 0438   MCV 86 11/02/2022 0929   MCH 27.3 12/03/2022 0438   MCHC 31.5 12/03/2022 0438   RDW 16.8 (H) 12/03/2022 0438   RDW 15.9 (H) 11/02/2022 0929   LYMPHSABS 3.1 11/02/2022 0929   MONOABS 0.6 04/08/2022 1058   EOSABS 0.0 11/02/2022 0929   BASOSABS 0.1 11/02/2022 0929   COAG: No results found for: "INR", "PROTIME"  BMP:     Latest Ref Rng & Units 12/03/2022    4:38 AM 12/02/2022    6:16 AM 12/01/2022   10:53 PM  BMP  Glucose 70 - 99 mg/dL 82  88  350   BUN 6 - 20 mg/dL 6  <5  <5   Creatinine 0.44 - 1.00 mg/dL 0.93  8.18  2.99   Sodium 135 - 145 mmol/L 138  137  136   Potassium 3.5 - 5.1 mmol/L 3.5  3.3  2.7   Chloride 98 - 111 mmol/L 104  102  100   CO2 22 - 32 mmol/L 25  27  27    Calcium 8.9 - 10.3 mg/dL 7.8  8.0  8.0     HEPATIC:     Latest Ref Rng & Units 12/03/2022    8:32 AM 12/01/2022   11:12 AM 11/20/2022    1:36 AM  Hepatic Function  Total Protein 6.5 - 8.1 g/dL 5.7  7.3  7.7   Albumin 3.5 - 5.0 g/dL 2.5  3.2  3.2   AST 15 - 41 U/L 31  20  16    ALT 0 - 44 U/L 25  17  15    Alk Phosphatase 38 - 126 U/L 70  70  79   Total Bilirubin 0.3 - 1.2 mg/dL 0.3  0.6  0.5   Bilirubin, Direct 0.0 - 0.2 mg/dL <3.7       CARDIAC:  Lab Results  Component Value Date   CKTOTAL 63 05/30/2016      Imaging: I personally reviewed and interpreted the available labs, imaging and endoscopic files.   Assessment/Plan:  This is a 52 year old female with complicated Crohn's disease status post  bowel resection with recurrent SBO in the past few months presented to the ER with worsening abdominal pain nausea and vomiting. GI was consulted for further management of Crohn's disease with small bowel obstruction   Patient is known to our service due to recurrent SBO over the last several months with hospitalization in July and most recent hospitalization where  patient was transferred to [facility] after multidisciplinary discussion for tertiary care.  Patient was discharged from [facility] with presumed conservative management and outpatient follow-up with surgery for further management of stricturing Crohn's disease.   Patient is  awaiting bed to be transferred to a tertiary center for further management.  Patient has expressed desire not to pursue her treatment at [facility] rather is requesting to be seen at [facility] or [facility]   While patient awaits for bed, would advise NG tube to low intermittent wall suction.  keep n.p.o., continue antibiotics and methylprednisolone 60 mg IV daily.  Continue with DVT prophylaxis   Vista Lawman, MD Gastroenterology and Hepatology [facility]   This chart has been completed using [facility] Dictation software, and while attempts have been made to ensure accuracy , certain words and phrases may not be transcribed as intended

## 2022-12-03 NOTE — ED Notes (Signed)
Spoke with flow manager called at this time to page out hospitalitist for DR [employer]. Misty Stanley

## 2022-12-03 NOTE — ED Provider Notes (Signed)
  Physical Exam  BP (!) 149/80   Pulse 82   Temp 98.3 F (36.8 C) (Oral)   Resp 15   Ht 5' 7.5" (1.715 m)   Wt 81.6 kg   SpO2 93%   BMI 27.78 kg/m   Physical Exam Vitals and nursing note reviewed.  HENT:     Head: Normocephalic and atraumatic.  Eyes:     Pupils: Pupils are equal, round, and reactive to light.  Cardiovascular:     Rate and Rhythm: Normal rate and regular rhythm.  Pulmonary:     Effort: Pulmonary effort is normal.     Breath sounds: Normal breath sounds.  Abdominal:     Palpations: Abdomen is soft.     Tenderness: There is no abdominal tenderness.     Comments: NG tube in place with green bilious drainage in collection canister  Skin:    General: Skin is warm and dry.  Neurological:     Mental Status: She is alert.  Psychiatric:        Mood and Affect: Mood normal.     Procedures  Procedures  ED Course / MDM   Clinical Course as of 12/03/22 2241  Thu Dec 01, 2022  1627 Consulted general surgery Dr. Robyne Peers regarding the patient who recommended either transfer to [facility] or [facility] for surgical intervention. [CR]  1730 [facility] had no beds available.  Will try [facility] [CR]  1818 Discussed callback from [facility] transfer center who talked to the medical director Dr. Milbert Coulter who declined transfer due to capacity. [CR]  1828 Consulted Dr. Robyne Peers again regarding the patient who recommended admission given every the hospital has declined.  Will insert NG tube and consult GI as well as hospitalist for admission. [CR]  1909 Consulted hospitalist Elgergawy regarding the patient who declined admission.  Will keep patient in the ED and on the transfer list to [facility], [place].  Patient still adamantly declining transfer to [facility]. [CR]  Fri Dec 02, 2022  1241 [facility] so declining any surgical transfer at this time.  Awaiting response from [facility] for possible transfer. [CR]  1332 [facility] as well as [facility] both declined transfer.   Recommended calling back in 24 hours to see if "things have changed."  Patient still adamantly declining transfer to [facility]. [CR]    Clinical Course User Index [CR] Peter Garter, PA   Medical Decision Making Amount and/or Complexity of Data Reviewed Labs: ordered. Radiology: ordered.  Risk Prescription drug management.  I assumed care of this patient.  Unfortunately we remained in the same holding pattern during the course of my shift.  The patient has been offered transfer to [facility] multiple times but continues to refuse transfer stating, "I am not leaving here until I am better and I am not going to be away from my family" the hospitalist team here has been kind enough to continue rounding on the patient but she remains under the care of the emergency department.  The GI service has also been seeing the patient daily and again recommends transfer to a tertiary center for operative fixation of bowel obstruction.  She has remained stable during my shift with an NG tube still in place.  Transfer of care to oncoming ED provider pending continued monitoring and final disposition  Final diagnosis SBO       Royanne Foots, DO 12/03/22 2248

## 2022-12-03 NOTE — ED Notes (Signed)
Patient given box of tissues at this time

## 2022-12-04 DIAGNOSIS — K50112 Crohn's disease of large intestine with intestinal obstruction: Secondary | ICD-10-CM | POA: Diagnosis not present

## 2022-12-04 DIAGNOSIS — E876 Hypokalemia: Secondary | ICD-10-CM | POA: Diagnosis not present

## 2022-12-04 DIAGNOSIS — K56609 Unspecified intestinal obstruction, unspecified as to partial versus complete obstruction: Secondary | ICD-10-CM | POA: Diagnosis not present

## 2022-12-04 LAB — COMPREHENSIVE METABOLIC PANEL
ALT: 39 U/L (ref 0–44)
AST: 43 U/L — ABNORMAL HIGH (ref 15–41)
Albumin: 2.6 g/dL — ABNORMAL LOW (ref 3.5–5.0)
Alkaline Phosphatase: 65 U/L (ref 38–126)
Anion gap: 8 (ref 5–15)
BUN: 6 mg/dL (ref 6–20)
CO2: 28 mmol/L (ref 22–32)
Calcium: 8 mg/dL — ABNORMAL LOW (ref 8.9–10.3)
Chloride: 104 mmol/L (ref 98–111)
Creatinine, Ser: 0.68 mg/dL (ref 0.44–1.00)
GFR, Estimated: >60 mL/min (ref >60)
Glucose, Bld: 100 mg/dL — ABNORMAL HIGH (ref 70–99)
Potassium: 3.5 mmol/L (ref 3.5–5.1)
Sodium: 140 mmol/L (ref 135–145)
Total Bilirubin: 0.3 mg/dL (ref 0.3–1.2)
Total Protein: 5.9 g/dL — ABNORMAL LOW (ref 6.5–8.1)

## 2022-12-04 LAB — MAGNESIUM: Magnesium: 1.7 mg/dL (ref 1.7–2.4)

## 2022-12-04 LAB — CBC
HCT: 32.4 % — ABNORMAL LOW (ref 36.0–46.0)
Hemoglobin: 10.2 g/dL — ABNORMAL LOW (ref 12.0–15.0)
MCH: 27.7 pg (ref 26.0–34.0)
MCHC: 31.5 g/dL (ref 30.0–36.0)
MCV: 88 fL (ref 80.0–100.0)
Platelets: 498 10*3/uL — ABNORMAL HIGH (ref 150–400)
RBC: 3.68 MIL/uL — ABNORMAL LOW (ref 3.87–5.11)
RDW: 17.2 % — ABNORMAL HIGH (ref 11.5–15.5)
WBC: 10.8 10*3/uL — ABNORMAL HIGH (ref 4.0–10.5)
nRBC: 0 % (ref 0.0–0.2)

## 2022-12-04 NOTE — Progress Notes (Addendum)
[facility]  Patient seen and examined.  She is resting comfortably in bed.  She feels that her abdominal pain is slowly improving, and currently denies nausea and vomiting.  She did have a small bowel movement yesterday, and confirms passing flatus yesterday.  NG tube remains in place with bilious output.  Exam: Nose: NG tube in place with bilious output Abdomen: Soft, mild distention, no percussion tenderness, nontender to palpation; no rigidity, guarding, rebound tenderness  Assessment and plan: Patient is a 52 year old female who presented to the hospital with recurrent small bowel obstruction secondary to Crohn's flare.  -This patient has refractory Crohn's and has a bowel obstruction secondary to her Crohn's which has failed conservative measures.  She would be better served at a tertiary care facility for potential surgical intervention -Patient awaiting transfer to tertiary care facility, either [facility] or [facility], as patient does not want to return to [facility] -No need for emergent surgical intervention -NG to LIS -IV fluids -Antibiotics per primary team -Medical management of Crohn's per GI -If patient continues to have bowel function, could consider clamping NG tube and trialing clear liquids to see if she is able to tolerate oral intake -No role for small bowel obstruction protocol given obstruction is from an inflammatory process and not adhesive disease -Care per primary team  Theophilus Kinds, DO [facility] [address] [place], [place] [address] [phone] (office)

## 2022-12-04 NOTE — Progress Notes (Signed)
PROGRESS NOTE    [name]  KGM:[id number] DOB: 16-Feb-1971 DOA: 12/01/2022 PCP: Kerri Perches, MD   Brief Narrative:    [name]  is a 52 y.o. female, with a history of Crohn's disease status post ileocolonic resection, GERD, bipolar disorder, OSA . -Patient presents to ED secondary to complaints of abdominal pain, nausea, patient with known history of disease, was recently admitted to [facility] from 11/20/2022, she was discharged 11/28/2022, for concern of SBO, secondary to colon colitis, with evidence of abscess, was discharged on p.o. Cipro, and Flagyl, was managed by surgical team there, with recommendation for follow-up in 2 weeks for outpatient surgical management, patient reports she was feeling fine upon discharge, but her symptoms has returned, she reports nausea, no emesis, she reports decreased oral intake, unable to tolerate few bites of rice as well chicken, mainly has been able to keep fluids, has BM this morning, no fever, no chills, no urinary symptoms.  Patient has been admitted with SBO likely secondary to Crohn's colitis and remains on IV fluids and antibiotics.  EDP working on transfer to [facility].  Assessment and Plan: Crohn's colitis disease exacerbation Small bowel obstruction -Patient with history of recurrent obstruction secondary to Crohn's exacerbation, with inflammatory changes, worsening or establish strictures at her anastomosis,. -Multiple recent hospitalization for the same, she was just discharged from [facility] 9/23 for the same -General Surgery input greatly appreciated, does appear patient with high-grade small bowel obstruction secondary to her active Crohn's flare, CT abdomen pelvis again showing high-grade small bowel obstruction secondary to active Crohn's flare, mentation by general surgery to transfer to  tertiary care facility for management by multidisciplinary team with more experience with chron's disease, patient had  failure of medical management, so it went via appropriate to admit her to this facility as likely she will end up needing surgical intervention by experienced multidisciplinary team with chron's disease, especially with her multiple surgeries in the past, and any surgery need to be performed will need to preserve as much small intestine as possible -Will help with medical management while in ED awaiting acceptance by a tertiary facility which is actively being arranged by ED (patient declines to go to [place], [place] and [facility] at capacity at the moment).  -Patient was discharged on p.o. Cipro and Flagyl from recent hospitalization, antibiotic has been continue on IV form. -Continue intermittent low suction NG tube. -Continue to maintain adequate hydration with IV fluids supplementation. -Continue IV steroids as per GI recommendations.  Hypokalemia -Currently improved/within normal limits -Continue maintenance IV fluids with D5 and potassium given ongoing GI losses   DVT prophylaxis: SCDs Code Status: Full Family Communication: None at bedside Disposition Plan: Awaiting transfer to tertiary facility per EDP  Procedures:  None  Antimicrobials:  Anti-infectives (From admission, onward)    Start     Dose/Rate Route Frequency Ordered Stop   12/01/22 1945  metroNIDAZOLE (FLAGYL) IVPB 500 mg        500 mg 100 mL/hr over 60 Minutes Intravenous Every 12 hours 12/01/22 1939     12/01/22 1945  ciprofloxacin (CIPRO) IVPB 400 mg        400 mg 200 mL/hr over 60 Minutes Intravenous Every 12 hours 12/01/22 1939        Subjective: Afebrile, no nausea, no vomiting.  Reported intermittent soreness in her abdomen and had NG tube in place with biliary color output.  Hemodynamically stable.  Objective: Vitals:   12/04/22 0430 12/04/22 0500  12/04/22 0510 12/04/22 0800  BP: 123/79 116/74  (!) 137/118  Pulse: 75 65  76  Resp: 11 13  (!) 28  Temp:   98.4 F (36.9 C)   TempSrc:   Oral   SpO2: 97% 96%   95%  Weight:      Height:        Intake/Output Summary (Last 24 hours) at 12/04/2022 1049 Last data filed at 12/04/2022 0510 Gross per 24 hour  Intake 1554.31 ml  Output 600 ml  Net 954.31 ml   Filed Weights   12/01/22 1032  Weight: 81.6 kg    Examination: General exam: Alert, awake, oriented x 3; afebrile and currently expressing no nausea.  NG tube in place.  Intermittent abdominal soreness reported. Respiratory system: Clear to auscultation. Respiratory effort normal.  Good saturation on room air. Cardiovascular system:RRR. No rubs or gallops; no JVD. Gastrointestinal system: Abdomen is slightly distended and mildly tender to the palpation.  Decreased but present bowel sounds on auscultation.  No guarding.  NG tube in place. Central nervous system: Alert and oriented. No focal neurological deficits. Extremities: No cyanosis, clubbing or edema. Skin: No rashes, no petechiae. Psychiatry: Judgement and insight appear normal.  Flat affect appreciated on exam.  Data Reviewed: I have personally reviewed following labs and imaging studies  CBC: Recent Labs  Lab 12/01/22 1112 12/02/22 0616 12/03/22 0438 12/04/22 0627  WBC 14.0* 10.9* 12.6* 10.8*  HGB 12.1 11.0* 10.1* 10.2*  HCT 38.9 34.6* 32.1* 32.4*  MCV 87.2 86.7 86.8 88.0  PLT 609* 476* 475* 498*   Basic Metabolic Panel: Recent Labs  Lab 12/01/22 1112 12/01/22 2253 12/02/22 0616 12/03/22 0438 12/04/22 0627  NA 136 136 137 138 140  K 2.4* 2.7* 3.3* 3.5 3.5  CL 93* 100 102 104 104  CO2 30 27 27 25 28   GLUCOSE 105* 102* 88 82 100*  BUN <5* <5* <5* 6 6  CREATININE 0.65 0.60 0.63 0.66 0.68  CALCIUM 8.5* 8.0* 8.0* 7.8* 8.0*  MG 1.8  --   --  1.7 1.7   GFR: Estimated Creatinine Clearance: 91.3 mL/min (by C-G formula based on SCr of 0.68 mg/dL).  Liver Function Tests: Recent Labs  Lab 12/01/22 1112 12/03/22 0832 12/04/22 0627  AST 20 31 43*  ALT 17 25 39  ALKPHOS 70 70 65  BILITOT 0.6 0.3 0.3  PROT 7.3  5.7* 5.9*  ALBUMIN 3.2* 2.5* 2.6*   Recent Labs  Lab 12/01/22 1112  LIPASE 34   Sepsis Labs: Recent Labs  Lab 12/01/22 1237  LATICACIDVEN 0.8   Radiology Studies: [facility] Chest Port 1 View  Result Date: 12/02/2022 CLINICAL DATA:  [id number] Encounter for orogastric (OG) tube placement [id number] EXAM: PORTABLE CHEST 1 VIEW COMPARISON:  07/04/2022. FINDINGS: Interval placement of nasogastric tube with its tip and side hole overlying the left upper quadrant, within the proximal stomach. Low lung volume. There are increased interstitial markings, which may be accentuated by low lung volume. There is a new heterogeneous opacity at the left lung base, which may represent focal pneumonitis versus atelectasis. Bilateral costophrenic angles are clear. Stable cardio-mediastinal silhouette. No acute osseous abnormalities. The soft tissues are within normal limits. IMPRESSION: 1. Interval placement of nasogastric tube with its tip and side hole overlying the left upper quadrant, within the proximal stomach. 2. Increased interstitial markings, which may be accentuated by low lung volume. These are nonspecific and differential diagnosis includes mild pulmonary edema or atypical infection. 3. New heterogeneous opacity at the  left lung base, which may represent focal pneumonitis versus atelectasis. Electronically Signed   By: Jules Schick M.D.   On: 12/02/2022 13:09     Scheduled Meds:  methylPREDNISolone (SOLU-MEDROL) injection  60 mg Intravenous Q24H   pantoprazole (PROTONIX) IV  40 mg Intravenous Q24H   Continuous Infusions:  ciprofloxacin 400 mg (12/04/22 0949)   dextrose 5 % and 0.9 % NaCl with KCl 40 mEq/L 75 mL/hr at 12/04/22 0510   metronidazole 500 mg (12/04/22 0950)     LOS: 0 days    Time spent: 35 minutes   Vassie Loll, MD [facility]  If 7PM-7AM, please contact night-coverage [email or web address] 12/04/2022, 10:49 AM

## 2022-12-04 NOTE — ED Notes (Signed)
Pt requesting nausea meds rn aware was just given at 11:00 . Misty Stanley

## 2022-12-04 NOTE — ED Provider Notes (Signed)
Patient awaiting transfer for small bowel obstruction in the setting of Crohn's disease. Physical Exam  BP (!) 137/118   Pulse 76   Temp 98.4 F (36.9 C) (Oral)   Resp (!) 28   Ht 5' 7.5" (1.715 m)   Wt 81.6 kg   SpO2 95%   BMI 27.78 kg/m   Physical Exam Vitals and nursing note reviewed.  Constitutional:      General: She is not in acute distress.    Appearance: She is well-developed. She is not ill-appearing, toxic-appearing or diaphoretic.  HENT:     Head: Normocephalic and atraumatic.     Mouth/Throat:     Mouth: Mucous membranes are moist.  Eyes:     Conjunctiva/sclera: Conjunctivae normal.  Cardiovascular:     Rate and Rhythm: Normal rate and regular rhythm.  Pulmonary:     Effort: Pulmonary effort is normal. No respiratory distress.  Abdominal:     Comments: NG tube in place  Musculoskeletal:        General: No swelling.     Cervical back: Neck supple.  Skin:    General: Skin is warm and dry.  Neurological:     General: No focal deficit present.     Mental Status: She is alert and oriented to person, place, and time.  Psychiatric:        Mood and Affect: Mood normal.        Behavior: Behavior normal.     Procedures  Procedures  ED Course / MDM   Clinical Course as of 12/04/22 1034  Thu Dec 01, 2022  1627 Consulted general surgery Dr. Robyne Peers regarding the patient who recommended either transfer to [facility] or [facility] for surgical intervention. [CR]  1730 [facility] had no beds available.  Will try [facility] general [facility] [CR]  1818 Discussed callback from [facility] transfer center who talked to the medical director Dr. Milbert Coulter who declined transfer due to capacity. [CR]  1828 Consulted Dr. Robyne Peers again regarding the patient who recommended admission given every the hospital has declined.  Will insert NG tube and consult GI as well as hospitalist for admission. [CR]  1909 Consulted hospitalist Elgergawy regarding the patient who declined admission.  Will  keep patient in the ED and on the transfer list to [facility] [place].  Patient still adamantly declining transfer to [facility]. [CR]  Fri Dec 02, 2022  1241 [facility] so declining any surgical transfer at this time.  Awaiting response from [facility] for possible transfer. [CR]  1332 [facility] as well as [facility] both declined transfer.  Recommended calling back in 24 hours to see if "things have changed."  Patient still adamantly declining transfer to [facility]. [CR]    Clinical Course User Index [CR] Peter Garter, PA   Medical Decision Making Amount and/or Complexity of Data Reviewed Labs: ordered. Radiology: ordered.  Risk Prescription drug management.   Today, patient reporting mildly improved symptoms.  She had another bowel movement yesterday.  I spoke with general surgeon on-call at [facility], Dr. Hillery Hunter.  He feels like her medical management so far has been appropriate.  She does not need any surgical intervention right now.  He stated that he would be happy to see her in consult as needed.  Plan will be for admission at [facility].  I spoke with general surgeon on-call here at [facility], Dr. Robyne Peers who will speak directly to Dr. Hillery Hunter.  Care of patient was signed out to oncoming ED  provider.       Gloris Manchester, MD 12/04/22 [phone]

## 2022-12-04 NOTE — ED Notes (Signed)
Patient complaining of Nausea, pt aware she had received nausea medication, Stated "The nausea has not went away and this happened last night." MD made Aware.

## 2022-12-04 NOTE — Progress Notes (Signed)
Gastroenterology & Hepatology   Interval History: Patient seen and examined bedside.  Reports that abdominal pain is better than before denies any nausea and vomiting.  Had 1 bowel movement since I saw her yesterday.  Passed gas yesterday but not today  Inpatient Medications:  Current Facility-Administered Medications:    ciprofloxacin (CIPRO) IVPB 400 mg, 400 mg, Intravenous, Q12H, Elgergawy, Leana Roe, MD, Last Rate: 200 mL/hr at 12/04/22 0949, 400 mg at 12/04/22 0949   dextrose 5 % and 0.9 % NaCl with KCl 40 mEq/L infusion, , Intravenous, Continuous, Shah, Pratik D, DO, Last Rate: 75 mL/hr at 12/04/22 1340, Infusion Verify at 12/04/22 1340   methylPREDNISolone sodium succinate (SOLU-MEDROL) 125 mg/2 mL injection 60 mg, 60 mg, Intravenous, Q24H, Mahon, Courtney L, NP, 60 mg at 12/03/22 1539   metroNIDAZOLE (FLAGYL) IVPB 500 mg, 500 mg, Intravenous, Q12H, Elgergawy, Leana Roe, MD, Last Rate: 100 mL/hr at 12/04/22 0950, 500 mg at 12/04/22 0950   morphine (PF) 4 MG/ML injection 4 mg, 4 mg, Intravenous, Q5H PRN, Sherian Maroon A, PA, 4 mg at 12/04/22 0945   ondansetron (ZOFRAN) injection 4 mg, 4 mg, Intravenous, Q8H PRN, Arabella Merles, PA-C, 4 mg at 12/04/22 1105   pantoprazole (PROTONIX) injection 40 mg, 40 mg, Intravenous, Q24H, Elgergawy, Leana Roe, MD, 40 mg at 12/03/22 2113  Current Outpatient Medications:    calcium-vitamin D (OSCAL WITH D) 500-200 MG-UNIT tablet, Take 1 tablet by mouth daily., Disp: , Rfl:    hydrOXYzine (ATARAX) 50 MG tablet, TAKE 2 TABLETS BY MOUTH AT BEDTIME FOR SLEEP, Disp: 60 tablet, Rfl: 0   magnesium oxide (MAG-OX) 400 (240 Mg) MG tablet, Take 400 mg by mouth daily., Disp: , Rfl:    methocarbamol (ROBAXIN) 500 MG tablet, Take 1 tablet (500 mg total) by mouth every 8 (eight) hours as needed for muscle spasms., Disp: 30 tablet, Rfl: 1   Multiple Vitamin (MULTIVITAMIN WITH MINERALS) TABS tablet, Take 1 tablet by mouth daily., Disp: , Rfl:    Omega-3 Fatty Acids (FISH  OIL) 1200 MG CAPS, Take 1,200 mg by mouth daily. , Disp: , Rfl:    ondansetron (ZOFRAN-ODT) 4 MG disintegrating tablet, Take 4 mg by mouth every 6 (six) hours as needed for vomiting or nausea., Disp: , Rfl:    pantoprazole (PROTONIX) 40 MG tablet, Take 1 tablet (40 mg total) by mouth daily., Disp: 30 tablet, Rfl: 1   Probiotic Product (PROBIOTIC PO), Take 1 tablet by mouth daily., Disp: , Rfl:    Pyridoxine HCl (VITAMIN B6 PO), Take 1 tablet by mouth daily., Disp: , Rfl:    Ustekinumab (STELARA [place]), Inject 90 mg into the skin as directed. Every 8 weeks, Disp: , Rfl:    venlafaxine XR (EFFEXOR-XR) 37.5 MG 24 hr capsule, TAKE 1 CAPSULE(37.5 MG) BY MOUTH DAILY WITH BREAKFAST (Patient taking differently: Take 37.5 mg by mouth daily with breakfast.), Disp: 30 capsule, Rfl: 3   vitamin C (ASCORBIC ACID) 500 MG tablet, Take 500 mg by mouth daily., Disp: , Rfl:    Vitamin D, Ergocalciferol, (DRISDOL) 1.25 MG (50000 UNIT) CAPS capsule, Take 50,000 Units by mouth once a week., Disp: , Rfl:    I/O    Intake/Output Summary (Last 24 hours) at 12/04/2022 1419 Last data filed at 12/04/2022 1340 Gross per 24 hour  Intake 2188.96 ml  Output 600 ml  Net 1588.96 ml     Physical Exam: Temp:  [98.2 F (36.8 C)-98.4 F (36.9 C)] 98.4 F (36.9 C) (09/29 1106) Pulse Rate:  [  65-114] 72 (09/29 1200) Resp:  [11-33] 15 (09/29 1200) BP: (116-171)/(65-118) 134/106 (09/29 1200) SpO2:  [91 %-97 %] 95 % (09/29 1200)  Temp (24hrs), Avg:98.3 F (36.8 C), Min:98.2 F (36.8 C), Max:98.4 F (36.9 C)  GENERAL: NG tube in place with bilious output LUNGS: Clear to auscultation. No presence of rhonchi/wheezing/rales. Adequate chest expansion HEART: RRR, normal s1 and s2. ABDOMEN: Soft, nontender, no guarding, no peritoneal signs, and nondistended. BS +. No masses.   Laboratory Data: CBC:     Component Value Date/Time   WBC 10.8 (H) 12/04/2022 0627   RBC 3.68 (L) 12/04/2022 0627   HGB 10.2 (L) 12/04/2022 0627    HGB 11.5 11/02/2022 0929   HCT 32.4 (L) 12/04/2022 0627   HCT 35.7 11/02/2022 0929   PLT 498 (H) 12/04/2022 0627   PLT 549 (H) 11/02/2022 0929   MCV 88.0 12/04/2022 0627   MCV 86 11/02/2022 0929   MCH 27.7 12/04/2022 0627   MCHC 31.5 12/04/2022 0627   RDW 17.2 (H) 12/04/2022 0627   RDW 15.9 (H) 11/02/2022 0929   LYMPHSABS 3.1 11/02/2022 0929   MONOABS 0.6 04/08/2022 1058   EOSABS 0.0 11/02/2022 0929   BASOSABS 0.1 11/02/2022 0929   COAG: No results found for: "INR", "PROTIME"  BMP:     Latest Ref Rng & Units 12/04/2022    6:27 AM 12/03/2022    4:38 AM 12/02/2022    6:16 AM  BMP  Glucose 70 - 99 mg/dL 161  82  88   BUN 6 - 20 mg/dL 6  6  <5   Creatinine 0.96 - 1.00 mg/dL 0.45  4.09  8.11   Sodium 135 - 145 mmol/L 140  138  137   Potassium 3.5 - 5.1 mmol/L 3.5  3.5  3.3   Chloride 98 - 111 mmol/L 104  104  102   CO2 22 - 32 mmol/L 28  25  27    Calcium 8.9 - 10.3 mg/dL 8.0  7.8  8.0     HEPATIC:     Latest Ref Rng & Units 12/04/2022    6:27 AM 12/03/2022    8:32 AM 12/01/2022   11:12 AM  Hepatic Function  Total Protein 6.5 - 8.1 g/dL 5.9  5.7  7.3   Albumin 3.5 - 5.0 g/dL 2.6  2.5  3.2   AST 15 - 41 U/L 43  31  20   ALT 0 - 44 U/L 39  25  17   Alk Phosphatase 38 - 126 U/L 65  70  70   Total Bilirubin 0.3 - 1.2 mg/dL 0.3  0.3  0.6   Bilirubin, Direct 0.0 - 0.2 mg/dL  <9.1      CARDIAC:  Lab Results  Component Value Date   CKTOTAL 63 05/30/2016      Imaging: I personally reviewed and interpreted the available labs, imaging and endoscopic files.   Assessment/Plan: This is a 52 year old female with complicated Crohn's disease status post  bowel resection with recurrent SBO in the past few months presented to the ER with worsening abdominal pain nausea and vomiting. GI was consulted for further management of Crohn's disease with small bowel obstruction     Patient is awaiting bed to be transferred to a tertiary center for further management as with previous  multidisciplinary discussion patient will be better served at a tertiary care center for potential surgical intervention.  Patient has expressed desire not to pursue her treatment at [facility] rather is requesting to  be seen at [facility] or [facility]   If continues to have bowel movement and passing gas may clamp NG tube tomorrow and reassess, continue antibiotics and methylprednisolone 60 mg IV daily.  Continue with DVT prophylaxis   Vista Lawman, MD Gastroenterology and Hepatology [facility]   This chart has been completed using [facility] Dictation software, and while attempts have been made to ensure accuracy , certain words and phrases may not be transcribed as intended

## 2022-12-05 ENCOUNTER — Ambulatory Visit: Payer: Medicare HMO

## 2022-12-05 ENCOUNTER — Telehealth: Payer: Self-pay | Admitting: Gastroenterology

## 2022-12-05 DIAGNOSIS — K56609 Unspecified intestinal obstruction, unspecified as to partial versus complete obstruction: Secondary | ICD-10-CM | POA: Diagnosis not present

## 2022-12-05 DIAGNOSIS — E876 Hypokalemia: Secondary | ICD-10-CM | POA: Diagnosis not present

## 2022-12-05 DIAGNOSIS — K50112 Crohn's disease of large intestine with intestinal obstruction: Secondary | ICD-10-CM | POA: Diagnosis not present

## 2022-12-05 DIAGNOSIS — K5080 Crohn's disease of both small and large intestine without complications: Secondary | ICD-10-CM

## 2022-12-05 MED ORDER — PREDNISONE 10 MG (21) PO TBPK: ORAL_TABLET | Freq: Every day | ORAL | 0 refills | Status: DC

## 2022-12-05 MED ORDER — ONDANSETRON 4 MG PO TBDP: 4.0000 mg | ORAL_TABLET | Freq: Four times a day (QID) | ORAL | 0 refills | Status: AC | PRN

## 2022-12-05 NOTE — Progress Notes (Signed)
Gastroenterology Progress Note    Primary Care Physician:  Kerri Perches, MD Primary Gastroenterologist:  Dr. Jena Gauss   Patient ID: [name]; [id number]; 1971-01-16    Subjective   Abdominal pain present but much improved. +flatus. Soft BM last night and this morning. No overt GI bleeding. Nausea worse at night. She is eager to go home and pursue outpatient surgical evaluation. Wants to attempt clear liquids. NG tube in place .   Objective   Vital signs in last 24 hours Temp:  [97.7 F (36.5 C)-98.5 F (36.9 C)] 98 F (36.7 C) (09/30 0455) Pulse Rate:  [70-84] 78 (09/30 0800) Resp:  [12-21] 15 (09/30 0800) BP: (103-155)/(71-106) 155/84 (09/30 0800) SpO2:  [94 %-96 %] 95 % (09/30 0800) Last BM Date : 12/03/22  Physical Exam General:   Alert and oriented, pleasant Head:  Normocephalic and atraumatic. Eyes:  No icterus, sclera clear. Conjuctiva pink.  Abdomen:  Bowel sounds present, soft, mild TTP epigastric. No rebound or guarding. Msk:  Symmetrical without gross deformities. Normal posture. Extremities:  Without edema. Neurologic:  Alert and  oriented x4  Intake/Output from previous day: 09/29 0701 - 09/30 0700 In: 634.7 [I.V.:634.7] Out: -  Intake/Output this shift: Total I/O In: 1400 [I.V.:1000; IV Piggyback:400] Out: -   Lab Results  Recent Labs    12/03/22 0438 12/04/22 0627  WBC 12.6* 10.8*  HGB 10.1* 10.2*  HCT 32.1* 32.4*  PLT 475* 498*   BMET Recent Labs    12/03/22 0438 12/04/22 0627  NA 138 140  K 3.5 3.5  CL 104 104  CO2 25 28  GLUCOSE 82 100*  BUN 6 6  CREATININE 0.66 0.68  CALCIUM 7.8* 8.0*   LFT Recent Labs    12/03/22 0832 12/04/22 0627  PROT 5.7* 5.9*  ALBUMIN 2.5* 2.6*  AST 31 43*  ALT 25 39  ALKPHOS 70 65  BILITOT 0.3 0.3  BILIDIR <0.1  --   IBILI NOT CALCULATED  --     Studies/Results DG Chest Port 1 View  Result Date: 12/02/2022 CLINICAL DATA:  [id number] Encounter for orogastric (OG) tube placement  [id number] EXAM: PORTABLE CHEST 1 VIEW COMPARISON:  07/04/2022. FINDINGS: Interval placement of nasogastric tube with its tip and side hole overlying the left upper quadrant, within the proximal stomach. Low lung volume. There are increased interstitial markings, which may be accentuated by low lung volume. There is a new heterogeneous opacity at the left lung base, which may represent focal pneumonitis versus atelectasis. Bilateral costophrenic angles are clear. Stable cardio-mediastinal silhouette. No acute osseous abnormalities. The soft tissues are within normal limits. IMPRESSION: 1. Interval placement of nasogastric tube with its tip and side hole overlying the left upper quadrant, within the proximal stomach. 2. Increased interstitial markings, which may be accentuated by low lung volume. These are nonspecific and differential diagnosis includes mild pulmonary edema or atypical infection. 3. New heterogeneous opacity at the left lung base, which may represent focal pneumonitis versus atelectasis. Electronically Signed   By: Jules Schick M.D.   On: 12/02/2022 13:09   CT ABDOMEN PELVIS W CONTRAST  Result Date: 12/01/2022 CLINICAL DATA:  Upper abdominal pain for 1 month. History of Crohn's disease. EXAM: CT ABDOMEN AND PELVIS WITH CONTRAST TECHNIQUE: Multidetector CT imaging of the abdomen and pelvis was performed using the standard protocol following bolus administration of intravenous contrast. RADIATION DOSE REDUCTION: This exam was performed according to the departmental dose-optimization program which includes automated exposure control, adjustment of the  mA and/or kV according to patient size and/or use of iterative reconstruction technique. CONTRAST:  OMNIPAQUE IOHEXOL 300 MG/ML  SOLN COMPARISON:  November 20, 2022. FINDINGS: Lower chest: No acute abnormality. Hepatobiliary: No focal liver abnormality is seen. Status post cholecystectomy. No biliary dilatation. Pancreas: Unremarkable. No  pancreatic ductal dilatation or surrounding inflammatory changes. Spleen: Normal in size without focal abnormality. Adrenals/Urinary Tract: Adrenal glands appear normal. Right renal cyst is noted fourth no further follow-up is required. No hydronephrosis or renal obstruction is noted. Urinary bladder is unremarkable. Stomach/Bowel: Stomach is unremarkable. Postsurgical changes are seen involving the cecum and terminal ileum there is severe distal small bowel dilatation which extends to the neoterminal ileum, where there is again noted severe inflammatory change which results in obstruction. Colon is nondilated. Vascular/Lymphatic: No significant vascular findings are present. No enlarged abdominal or pelvic lymph nodes. Reproductive: Uterus and bilateral adnexa are unremarkable. Other: No abdominal wall hernia or abnormality. No abdominopelvic ascites. Musculoskeletal: No acute or significant osseous findings. IMPRESSION: There is again noted severe distal small bowel obstruction secondary to extensive inflammation and wall thickening involving the neoterminal ileum, presumably representing acute inflammation secondary to Crohn's disease. Electronically Signed   By: Lupita Raider M.D.   On: 12/01/2022 16:05   DG Abd 1 View  Result Date: 11/20/2022 CLINICAL DATA:  Encounter for nasogastric tube EXAM: ABDOMEN - 1 VIEW COMPARISON:  Abdominal CT from earlier today FINDINGS: Enteric tube with tip and side-port over the stomach which is moderately gas distended. Low volume chest with interstitial crowding. No airspace disease on preceding CT at the lung bases. Multiple right-sided surgical clips. IMPRESSION: 1. Enteric tube with tip and side port at the stomach. 2. Low lung volumes. Electronically Signed   By: Tiburcio Pea M.D.   On: 11/20/2022 06:33   CT ABDOMEN PELVIS W CONTRAST  Result Date: 11/20/2022 CLINICAL DATA:  Patient with Crohn's disease having abdominal pain around umbilicus for 1-2 weeks getting  worse. Nausea. EXAM: CT ABDOMEN AND PELVIS WITH CONTRAST TECHNIQUE: Multidetector CT imaging of the abdomen and pelvis was performed using the standard protocol following bolus administration of intravenous contrast. RADIATION DOSE REDUCTION: This exam was performed according to the departmental dose-optimization program which includes automated exposure control, adjustment of the mA and/or kV according to patient size and/or use of iterative reconstruction technique. CONTRAST:  OMNIPAQUE IOHEXOL 300 MG/ML  SOLN COMPARISON:  CT abdomen and pelvis 09/17/2022 FINDINGS: Lower chest: No acute abnormality. Hepatobiliary: Cholelithiasis. Mild prominence of the intra and extrahepatic biliary ducts due to reservoir effect. Pancreas: Unremarkable. Spleen: Unremarkable. Adrenals/Urinary Tract: Normal adrenal glands. No urinary calculi or hydronephrosis. Unremarkable bladder. Stomach/Bowel: Postoperative change at the terminal ileum with neoterminal ileum. Dilated loops of small bowel measuring up to 6.3 cm in diameter with abrupt transition point at the neoterminal ileum along a 6-7 cm segment of marked wall thickening and hyperenhancement. There is inflammatory stranding about the terminal ileum with trace free fluid. There is a small 1.5 x 1.4 cm fluid collection adjacent to the terminal ileum compatible with abscess (series 2/image 55). Decompressed colon. Stomach is within normal limits. The appendix is not visualized. Vascular/Lymphatic: No significant vascular findings are present. Enlarged central mesenteric lymph nodes measuring up to 1.4 cm (2/43) are not significantly changed from 09/17/2022 and likely reactive. Reproductive: Unremarkable. Other: No free intraperitoneal air. Musculoskeletal: No acute fracture. IMPRESSION: 1. High-grade small bowel obstruction secondary to inflammatory changes at the neoterminal ileum. 2. There is a small 1.5 cm  fluid collection adjacent to the terminal ileum compatible with  abscess. Electronically Signed   By: Minerva Fester M.D.   On: 11/20/2022 03:42    Assessment  52 y.o. delightful female with complicated Crohn's disease s/p bowel resection, recurrent SBOs in the past few months, now presenting again with SBO.   She has been awaiting transfer to tertiary center for potential surgical intervention but bed availability has been limited. She would like to go home and follow-up with Surgery as outpatient, as she is clinically feeling better this morning with passing stool and flatus.   Will clamp NG tube and trial sips of clear liquids. Hopefully, she can tolerate this and we can arrange expeditious outpatient evaluation.     Plan / Recommendations  Clamp NG tube Trial clears Continue course of abx Will need to continue steroids with taper as outpatient    LOS: 0 days    12/05/2022, 9:52 AM  Gelene Mink, PhD, ANP-BC [facility]

## 2022-12-05 NOTE — Discharge Instructions (Addendum)
A prescription for a steroid taper was sent to your pharmacy.  Take as prescribed.  Continue clear liquids only today.  Advance your diet very slowly.  [name] should be arranging an outpatient follow-up with surgery.  If you do not hear from the GI office, call the number below.  If you do have any worsening symptoms, return to the emergency department.

## 2022-12-05 NOTE — Addendum Note (Signed)
Addended by: Armstead Peaks on: 12/05/2022 03:36 PM   Modules accepted: Orders

## 2022-12-05 NOTE — Telephone Encounter (Signed)
Mindy and Tammy:  Please refer to [facility] or [facility] expeditiously if possible due to history of complicated Crohn's disease s/p bowel resection, recurrent SBOs, recently in the ED again with SBO, failing Humira and Stelara. She was admitted to [facility] in September for flare and partial SBO as well and medical management was provided in order for a "cooling off" period prior to operative intervention. She does NOT want to return to [facility].

## 2022-12-05 NOTE — ED Notes (Signed)
Pt's NG tube removed, tolerated well. Verbalized understanding of all discharge instructions and discharged home in with all personal belongings.

## 2022-12-05 NOTE — ED Provider Notes (Signed)
Patient now boarding in the ED for the past 4 days.  Initially presented for abdominal pain secondary to small bowel obstruction from Crohn's stricture. Physical Exam  BP (!) 155/84   Pulse 78   Temp 98 F (36.7 C)   Resp 15   Ht 5' 7.5" (1.715 m)   Wt 81.6 kg   SpO2 95%   BMI 27.78 kg/m   Physical Exam Vitals and nursing note reviewed.  Constitutional:      General: She is not in acute distress.    Appearance: She is well-developed. She is not ill-appearing, toxic-appearing or diaphoretic.  HENT:     Head: Normocephalic and atraumatic.     Mouth/Throat:     Mouth: Mucous membranes are moist.  Eyes:     Conjunctiva/sclera: Conjunctivae normal.  Cardiovascular:     Rate and Rhythm: Normal rate and regular rhythm.  Pulmonary:     Effort: Pulmonary effort is normal. No respiratory distress.  Abdominal:     General: There is no distension.  Musculoskeletal:        General: No swelling.     Cervical back: Neck supple.  Skin:    General: Skin is warm and dry.  Neurological:     General: No focal deficit present.     Mental Status: She is alert and oriented to person, place, and time.  Psychiatric:        Mood and Affect: Mood normal.        Behavior: Behavior normal.     Procedures  Procedures  ED Course / MDM   Clinical Course as of 12/05/22 1231  Thu Dec 01, 2022  1627 Consulted general surgery Dr. Robyne Peers regarding the patient who recommended either transfer to [facility] or [facility] for surgical intervention. [CR]  17[facility] had no beds available.  Will try [facility] [CR]  1818 Discussed callback from [facility] transfer center who talked to the medical director Dr. Milbert Coulter who declined transfer due to capacity. [CR]  1828 Consulted Dr. Robyne Peers again regarding the patient who recommended admission given every the hospital has declined.  Will insert NG tube and consult GI as well as hospitalist for admission. [CR]  1909 Consulted hospitalist Elgergawy  regarding the patient who declined admission.  Will keep patient in the ED and on the transfer list to [facility] [place].  Patient still adamantly declining transfer to [facility]. [CR]  Fri Dec 02, 2022  1241 [facility] so declining any surgical transfer at this time.  Awaiting response from [facility] for possible transfer. [CR]  1332 [facility] as well as [facility] both declined transfer.  Recommended calling back in 24 hours to see if "things have changed."  Patient still adamantly declining transfer to [facility]. [CR]    Clinical Course User Index [CR] Peter Garter, PA   Medical Decision Making Amount and/or Complexity of Data Reviewed Labs: ordered. Radiology: ordered.  Risk Prescription drug management.   Patient was seen by GI and surgery yesterday who plan for possible clamping of NGT today and trialing clear liquids.  This morning, she reports continued improved symptoms.  She had bowel movements last night and this morning.  She reports ongoing flatus.  She also states that she would like to go home today.  I reached out to surgery and GI so that they can see her this morning.  GI evaluated and will clamp NG tube and trial p.o. intake.  Patient was able to tolerate clear liquids.  General surgeon, Dr. Robyne Peers stated that she can get admitted here to continue to advance diet and continue medical management.  Patient stated that she is not willing to stay.  She was as adamant about this as she was of not willing to go to [facility].  GI team to arrange outpatient surgery follow-up.  Steroid taper was prescribed.  Patient was counseled on advancing diet very slowly.  She was discharged in stable condition.       Gloris Manchester, MD 12/05/22 [phone]

## 2022-12-05 NOTE — Progress Notes (Signed)
PROGRESS NOTE    [name]  WUJ:[id number] DOB: Aug 10, 1970 DOA: 12/01/2022 PCP: Kerri Perches, MD   Brief Narrative:    [name]  is a 52 y.o. female, with a history of Crohn's disease status post ileocolonic resection, GERD, bipolar disorder, OSA . -Patient presents to ED secondary to complaints of abdominal pain, nausea, patient with known history of disease, was recently admitted to [facility] from 11/20/2022, she was discharged 11/28/2022, for concern of SBO, secondary to colon colitis, with evidence of abscess, was discharged on p.o. Cipro, and Flagyl, was managed by surgical team there, with recommendation for follow-up in 2 weeks for outpatient surgical management, patient reports she was feeling fine upon discharge, but her symptoms has returned, she reports nausea, no emesis, she reports decreased oral intake, unable to tolerate few bites of rice as well chicken, mainly has been able to keep fluids, has BM this morning, no fever, no chills, no urinary symptoms.  Patient has been admitted with SBO likely secondary to Crohn's colitis and remains on IV fluids and antibiotics.  EDP working on transfer to [facility].  Assessment and Plan: Crohn's colitis disease exacerbation Small bowel obstruction -Patient with history of recurrent obstruction secondary to Crohn's exacerbation, with inflammatory changes, worsening or establish strictures at her anastomosis,. -Multiple recent hospitalization for the same, she was just discharged from [facility] 9/23 for the same -General Surgery input greatly appreciated, does appear patient with high-grade small bowel obstruction secondary to her active Crohn's flare, CT abdomen pelvis again showing high-grade small bowel obstruction secondary to active Crohn's flare, mentation by general surgery to transfer to  tertiary care facility for management by multidisciplinary team with more experience with chron's disease, patient had  failure of medical management, so it went via appropriate to admit her to this facility as likely she will end up needing surgical intervention by experienced multidisciplinary team with chron's disease, especially with her multiple surgeries in the past, and any surgery need to be performed will need to preserve as much small intestine as possible -Will help with medical management while in ED awaiting acceptance by a tertiary facility which is actively being arranged by ED (patient declines to go to [place], [place] and [facility] at capacity at the moment).  -Patient was discharged on p.o. Cipro and Flagyl from recent hospitalization, antibiotic has been continue on IV form. -Continue to maintain adequate hydration with IV fluids supplementation. -Continue IV steroids as per GI recommendations. -Planning NG tube clamping and diet advancement per GI service. -Will remain available if needed.  Hypokalemia -Currently improved/within normal limits -Continue maintenance IV fluids with D5 and potassium given ongoing GI losses   DVT prophylaxis: SCDs Code Status: Full Family Communication: None at bedside Disposition Plan: Awaiting transfer to tertiary facility per EDP  Procedures:  None  Antimicrobials:  Anti-infectives (From admission, onward)    Start     Dose/Rate Route Frequency Ordered Stop   12/01/22 1945  metroNIDAZOLE (FLAGYL) IVPB 500 mg        500 mg 100 mL/hr over 60 Minutes Intravenous Every 12 hours 12/01/22 1939     12/01/22 1945  ciprofloxacin (CIPRO) IVPB 400 mg        400 mg 200 mL/hr over 60 Minutes Intravenous Every 12 hours 12/01/22 1939        Subjective: No fever, no nausea, no vomiting.  Expressed no abdominal pain, bowel movements overnight and overall feeling better.  She will like to have something  to eat and is contemplating after discussing with the ED provider potentially going home.  Objective: Vitals:   12/05/22 0452 12/05/22 0455 12/05/22 0800 12/05/22 1236   BP: (!) 141/95  (!) 155/84 (!) 164/83  Pulse: 80  78 79  Resp: 17  15 16   Temp:  98 F (36.7 C)    TempSrc:      SpO2: 94%  95% 98%  Weight:      Height:        Intake/Output Summary (Last 24 hours) at 12/05/2022 1754 Last data filed at 12/05/2022 0946 Gross per 24 hour  Intake 1453.7 ml  Output --  Net 1453.7 ml   Filed Weights   12/01/22 1032  Weight: 81.6 kg    Examination: General exam: Alert, awake, oriented x 3; reports feeling better and having bowel movement overnight. Respiratory system: Clear to auscultation. Respiratory effort normal.  Good saturation on room air. Cardiovascular system:RRR. No murmurs, rubs, gallops. Gastrointestinal system: Abdomen is nondistended, soft and nontender.  Decreased but present bowel sounds appreciated.  No guarding. Central nervous system: Alert and oriented. No focal neurological deficits. Extremities: No cyanosis or clubbing. Skin: No petechiae. Psychiatry: Judgement and insight appear normal. Mood & affect appropriate.   Data Reviewed: I have personally reviewed following labs and imaging studies  CBC: Recent Labs  Lab 12/01/22 1112 12/02/22 0616 12/03/22 0438 12/04/22 0627  WBC 14.0* 10.9* 12.6* 10.8*  HGB 12.1 11.0* 10.1* 10.2*  HCT 38.9 34.6* 32.1* 32.4*  MCV 87.2 86.7 86.8 88.0  PLT 609* 476* 475* 498*   Basic Metabolic Panel: Recent Labs  Lab 12/01/22 1112 12/01/22 2253 12/02/22 0616 12/03/22 0438 12/04/22 0627  NA 136 136 137 138 140  K 2.4* 2.7* 3.3* 3.5 3.5  CL 93* 100 102 104 104  CO2 30 27 27 25 28   GLUCOSE 105* 102* 88 82 100*  BUN <5* <5* <5* 6 6  CREATININE 0.65 0.60 0.63 0.66 0.68  CALCIUM 8.5* 8.0* 8.0* 7.8* 8.0*  MG 1.8  --   --  1.7 1.7   GFR: Estimated Creatinine Clearance: 91.3 mL/min (by C-G formula based on SCr of 0.68 mg/dL).  Liver Function Tests: Recent Labs  Lab 12/01/22 1112 12/03/22 0832 12/04/22 0627  AST 20 31 43*  ALT 17 25 39  ALKPHOS 70 70 65  BILITOT 0.6 0.3 0.3   PROT 7.3 5.7* 5.9*  ALBUMIN 3.2* 2.5* 2.6*   Recent Labs  Lab 12/01/22 1112  LIPASE 34   Sepsis Labs: Recent Labs  Lab 12/01/22 1237  LATICACIDVEN 0.8   Radiology Studies: No results found.   Scheduled Meds:  methylPREDNISolone (SOLU-MEDROL) injection  60 mg Intravenous Q24H   pantoprazole (PROTONIX) IV  40 mg Intravenous Q24H   Continuous Infusions:  ciprofloxacin Stopped (12/05/22 0928)   dextrose 5 % and 0.9 % NaCl with KCl 40 mEq/L Stopped (12/05/22 0946)   metronidazole Stopped (12/05/22 0946)     LOS: 0 days    Time spent: 30 minutes   Vassie Loll, MD [facility]  If 7PM-7AM, please contact night-coverage [email or web address] 12/05/2022, 5:54 PM

## 2022-12-05 NOTE — Telephone Encounter (Addendum)
Referral sent to IBD clinic at [facility] Fax [phone]

## 2022-12-06 ENCOUNTER — Encounter (HOSPITAL_COMMUNITY): Payer: Self-pay | Admitting: Hematology

## 2022-12-06 ENCOUNTER — Encounter: Payer: Self-pay | Admitting: Gastroenterology

## 2022-12-07 ENCOUNTER — Encounter: Payer: Self-pay | Admitting: Family Medicine

## 2022-12-12 MED ORDER — PANTOPRAZOLE SODIUM 40 MG PO TBEC: 40.0000 mg | DELAYED_RELEASE_TABLET | Freq: Every day | ORAL | 3 refills | Status: DC

## 2022-12-12 MED ORDER — PREDNISONE 10 MG PO TABS: 10.0000 mg | ORAL_TABLET | Freq: Every day | ORAL | 0 refills | Status: DC

## 2022-12-14 ENCOUNTER — Encounter: Payer: Self-pay | Admitting: Gastroenterology

## 2022-12-14 ENCOUNTER — Ambulatory Visit: Payer: Medicare HMO | Admitting: Gastroenterology

## 2022-12-14 VITALS — BP 134/77 | HR 87 | Temp 98.1°F | Ht 67.5 in | Wt 182.2 lb

## 2022-12-14 DIAGNOSIS — K50819 Crohn's disease of both small and large intestine with unspecified complications: Secondary | ICD-10-CM | POA: Diagnosis not present

## 2022-12-14 MED ORDER — PREDNISONE 10 MG PO TABS: ORAL_TABLET | ORAL | 0 refills | Status: AC

## 2022-12-14 NOTE — Addendum Note (Signed)
Addended by: Gelene Mink on: 12/14/2022 11:32 AM   Modules accepted: Orders

## 2022-12-14 NOTE — Telephone Encounter (Signed)
[name], Before I release the labs and call the pt, did you mean to have her going to 2 different labs for these test. Please advise
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
## 2022-12-14 NOTE — Telephone Encounter (Signed)
Mindy: can we actually refer to [facility]? She needs this expedited. She has family in [place] that can be helpful if needing to have surgery. Please see below reasons.    Dena: I am asking the patient to complete labs and fecal calprotectin in 2 weeks. If she does not continue to improve or has persistently elevated markers, we will need to change Stelara to every 4 weeks. Please release labs and let her know to go in about 2 weeks to the lab to complete this (one is a stool test).

## 2022-12-14 NOTE — Telephone Encounter (Signed)
Referral faxed to [facility].

## 2022-12-14 NOTE — Patient Instructions (Signed)
I have sent in a new steroid taper starting today with the 40 mg for 7 days, then 30 mg for 7 days, then 20 mg for 7 days, then 10 mg for 7 days. I want to do a slower taper.  Will be in touch about Stelara dosing!  We are referring to [facility].  I would like to see you in 6-8 weeks. Please message with any concerns whatsoever!  I enjoyed seeing you again today! I value our relationship and want to provide genuine, compassionate, and quality care. You may receive a survey regarding your visit with me, and I welcome your feedback! Thanks so much for taking the time to complete this. I look forward to seeing you again.      Gelene Mink, PhD, ANP-BC [facility] Gastroenterology

## 2022-12-15 ENCOUNTER — Other Ambulatory Visit: Payer: Self-pay

## 2022-12-15 DIAGNOSIS — K5080 Crohn's disease of both small and large intestine without complications: Secondary | ICD-10-CM

## 2022-12-15 NOTE — Telephone Encounter (Signed)
Needs to be [facility] for all. Thanks!

## 2022-12-15 NOTE — Addendum Note (Signed)
Addended by: Gelene Mink on: 12/15/2022 02:27 PM   Modules accepted: Orders

## 2022-12-15 NOTE — Telephone Encounter (Signed)
They are on 2 different forms. One states [facility] and the other states [facility]

## 2022-12-15 NOTE — Telephone Encounter (Signed)
Ok. I have re-ordered the CRP and sed rate for [facility]. Please make sure the fecal calprotectin, CRP, and sed rate are all released and available for her. THanks!

## 2022-12-23 ENCOUNTER — Encounter: Payer: Self-pay | Admitting: Family Medicine

## 2022-12-23 ENCOUNTER — Ambulatory Visit: Payer: Medicare HMO | Admitting: Professional Counselor

## 2022-12-23 ENCOUNTER — Ambulatory Visit (INDEPENDENT_AMBULATORY_CARE_PROVIDER_SITE_OTHER): Payer: Medicare HMO | Admitting: Family Medicine

## 2022-12-23 VITALS — BP 156/80 | HR 75 | Ht 67.0 in | Wt 183.1 lb

## 2022-12-23 DIAGNOSIS — R252 Cramp and spasm: Secondary | ICD-10-CM

## 2022-12-23 DIAGNOSIS — K50019 Crohn's disease of small intestine with unspecified complications: Secondary | ICD-10-CM

## 2022-12-23 DIAGNOSIS — I10 Essential (primary) hypertension: Secondary | ICD-10-CM | POA: Diagnosis not present

## 2022-12-23 DIAGNOSIS — Z1231 Encounter for screening mammogram for malignant neoplasm of breast: Secondary | ICD-10-CM | POA: Diagnosis not present

## 2022-12-23 DIAGNOSIS — R7989 Other specified abnormal findings of blood chemistry: Secondary | ICD-10-CM

## 2022-12-23 DIAGNOSIS — K56609 Unspecified intestinal obstruction, unspecified as to partial versus complete obstruction: Secondary | ICD-10-CM | POA: Diagnosis not present

## 2022-12-23 DIAGNOSIS — F4321 Adjustment disorder with depressed mood: Secondary | ICD-10-CM | POA: Diagnosis not present

## 2022-12-23 DIAGNOSIS — F330 Major depressive disorder, recurrent, mild: Secondary | ICD-10-CM

## 2022-12-23 DIAGNOSIS — E663 Overweight: Secondary | ICD-10-CM | POA: Diagnosis not present

## 2022-12-23 DIAGNOSIS — R110 Nausea: Secondary | ICD-10-CM

## 2022-12-23 DIAGNOSIS — Z13220 Encounter for screening for lipoid disorders: Secondary | ICD-10-CM

## 2022-12-23 MED ORDER — OLMESARTAN MEDOXOMIL 20 MG PO TABS: 20.0000 mg | ORAL_TABLET | Freq: Every day | ORAL | 2 refills | Status: DC

## 2022-12-23 MED ORDER — ONDANSETRON 4 MG PO TBDP: 4.0000 mg | ORAL_TABLET | Freq: Three times a day (TID) | ORAL | 0 refills | Status: DC | PRN
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
## 2022-12-23 NOTE — Patient Instructions (Addendum)
Annual exam early December , call iof you ned me before   New for blood pressure is olmesartan 20 mg daily  I hope that surgery gets sorted out soon  Cmp and EGFr, Magnesium and PTH level level today Limited zofran ids prescribed, and I will message GI  Please schedule mammogram at checkout   Flu and ovid vaccines are recommended

## 2022-12-25 DIAGNOSIS — R7989 Other specified abnormal findings of blood chemistry: Secondary | ICD-10-CM | POA: Insufficient documentation

## 2022-12-25 DIAGNOSIS — R110 Nausea: Secondary | ICD-10-CM | POA: Insufficient documentation

## 2022-12-25 LAB — CMP14+EGFR
ALT: 11 [IU]/L (ref 0–32)
AST: 12 [IU]/L (ref 0–40)
Albumin: 3.2 g/dL — ABNORMAL LOW (ref 3.8–4.9)
Alkaline Phosphatase: 71 [IU]/L (ref 44–121)
BUN/Creatinine Ratio: 15 (ref 9–23)
BUN: 9 mg/dL (ref 6–24)
Bilirubin Total: <0.2 mg/dL (ref 0.0–1.2)
CO2: 29 mmol/L (ref 20–29)
Calcium: 8.6 mg/dL — ABNORMAL LOW (ref 8.7–10.2)
Chloride: 101 mmol/L (ref 96–106)
Creatinine, Ser: 0.62 mg/dL (ref 0.57–1.00)
Globulin, Total: 3.3 g/dL (ref 1.5–4.5)
Glucose: 82 mg/dL (ref 70–99)
Potassium: 4.1 mmol/L (ref 3.5–5.2)
Sodium: 142 mmol/L (ref 134–144)
Total Protein: 6.5 g/dL (ref 6.0–8.5)
eGFR: 106 mL/min/{1.73_m2} (ref >59)

## 2022-12-25 LAB — MAGNESIUM: Magnesium: 1.9 mg/dL (ref 1.6–2.3)

## 2022-12-25 LAB — PARATHYROID HORMONE, INTACT (NO CA): PTH: 76 pg/mL — ABNORMAL HIGH (ref 15–65)

## 2022-12-25 NOTE — Assessment & Plan Note (Signed)
Persistent with abn PTH level refer Endo , update lab also

## 2022-12-25 NOTE — Progress Notes (Signed)
[name]     MRN: [id number]      DOB: 03/09/1970  Chief Complaint  Patient presents with   Follow-up    Follow up refill on zofran    HPI [name] is here for follow up and re-evaluation of chronic medical conditions, medication management and review of any available recent lab and radiology data.  Preventive health is updated, specifically  Cancer screening and Immunization.  Holding off on flu vaccine at this visit Has been to the hospital and been  held in the ED for several days waiting on a bed for surgical admission for obstruction from  the complications of Chron's disease waiting on date for surgery and is requesting [facility] for several reasons and GI is working with her on this C/o significant nausea requets rx stating that she can use up to 3 tabs /day between 3 and 4 days per week, I have advised I will message GI about this and rx a limited supply The PT denies any adverse reactions to current medications since the last visit.  ROS Denies recent fever or chills. Denies sinus pressure, nasal congestion, ear pain or sore throat. Denies chest congestion, productive cough or wheezing. Denies chest pains, palpitations and leg swelling Denies abdominal pain, nausea, vomiting,diarrhea or constipation.   Denies dysuria, frequency, hesitancy or incontinence. Denies joint pain, swelling and limitation in mobility. Denies headaches, seizures, numbness, or tingling. C/o  depression and  anxiety working through [employer] with the help of her therapist Denies skin break down or rash.   PE  BP (!) 156/80   Pulse 75   Ht 5\' 7"  (1.702 m)   Wt 183 lb 1.9 oz (83.1 kg)   SpO2 97%   BMI 28.68 kg/m   Patient alert and oriented and in no cardiopulmonary distress.  HEENT: No facial asymmetry, EOMI,     Neck supple .  Chest: Clear to auscultation bilaterally.  CVS: S1, S2 no murmurs, no S3.Regular rate.  Ext: No edema  MS: Adequate ROM spine, shoulders, hips and knees.  Skin:  Intact, no ulcerations or rash noted.  Psych: Good eye contact, normal affect. Memory intact not anxious or depressed appearing.  CNS: CN 2-12 intact, power,  normal throughout.no focal deficits noted.   Assessment & Plan  SBO (small bowel obstruction) (HCC) Resolved however awaiting definitive surgery, plagued with increased nausea, denies vomiting or constipation currently  Hypocalcemia Persistent with abn PTH level refer Endo , update lab also  Leg cramps Check potassium and magnesium levels  Crohn's disease of ileum with complication [facility]) Surgery pending at [facility], recently had significant obstruction  Complicated grief Good response to therapyand current meds, continue both  Nausea without vomiting Increased with frequent bad days needing zofran 3 to 4 times daily, fil limited amt and have GI follow up  Essential hypertension Uncontrolled and at several visits Start olmesartan 20 mg daily and re eval in 6 to 8 weeks DASH diet and commitment to daily physical activity for a minimum of 30 minutes discussed and encouraged, as a part of hypertension management. The importance of attaining a healthy weight is also discussed.     12/23/2022    3:29 PM 12/23/2022    2:51 PM 12/23/2022    2:49 PM 12/14/2022    8:32 AM 12/05/2022   12:36 PM 12/05/2022    8:00 AM 12/05/2022    4:52 AM  BP/Weight  Systolic BP 156 153 152 134 164 155 141  Diastolic BP 80  86 80 77 83 84 95  Wt. (Lbs)   183.12 182.2     BMI   28.68 kg/m2 28.12 kg/m2

## 2022-12-25 NOTE — Assessment & Plan Note (Addendum)
Good response to therapyand current meds, continue both

## 2022-12-25 NOTE — Assessment & Plan Note (Signed)
Uncontrolled and at several visits Start olmesartan 20 mg daily and re eval in 6 to 8 weeks DASH diet and commitment to daily physical activity for a minimum of 30 minutes discussed and encouraged, as a part of hypertension management. The importance of attaining a healthy weight is also discussed.     12/23/2022    3:29 PM 12/23/2022    2:51 PM 12/23/2022    2:49 PM 12/14/2022    8:32 AM 12/05/2022   12:36 PM 12/05/2022    8:00 AM 12/05/2022    4:52 AM  BP/Weight  Systolic BP 156 153 152 134 164 155 141  Diastolic BP 80 86 80 77 83 84 95  Wt. (Lbs)   183.12 182.2     BMI   28.68 kg/m2 28.12 kg/m2

## 2022-12-25 NOTE — Assessment & Plan Note (Addendum)
Resolved however awaiting definitive surgery, plagued with increased nausea, denies vomiting or constipation currently

## 2022-12-25 NOTE — Assessment & Plan Note (Signed)
Surgery pending at [facility], recently had significant obstruction

## 2022-12-25 NOTE — Assessment & Plan Note (Signed)
Check potassium and magnesium levels.

## 2022-12-25 NOTE — Assessment & Plan Note (Signed)
Increased with frequent bad days needing zofran 3 to 4 times daily, fil limited amt and have GI follow up

## 2022-12-26 ENCOUNTER — Other Ambulatory Visit: Payer: Self-pay | Admitting: Gastroenterology

## 2022-12-26 MED ORDER — DICYCLOMINE HCL 10 MG PO CAPS: 10.0000 mg | ORAL_CAPSULE | Freq: Three times a day (TID) | ORAL | 1 refills | Status: DC

## 2022-12-26 NOTE — Addendum Note (Signed)
Addended by: Gelene Mink on: 12/26/2022 01:35 PM   Modules accepted: Orders

## 2022-12-28 ENCOUNTER — Encounter (HOSPITAL_COMMUNITY): Payer: Self-pay

## 2022-12-28 ENCOUNTER — Ambulatory Visit (HOSPITAL_COMMUNITY)
Admission: RE | Admit: 2022-12-28 | Discharge: 2022-12-28 | Disposition: A | Discharge status: Home / Self Care | Payer: Medicare HMO | Source: Ambulatory Visit | Attending: Family Medicine | Admitting: Family Medicine

## 2022-12-28 DIAGNOSIS — Z1231 Encounter for screening mammogram for malignant neoplasm of breast: Secondary | ICD-10-CM | POA: Insufficient documentation

## 2023-01-01 ENCOUNTER — Encounter: Payer: Self-pay | Admitting: Family Medicine

## 2023-01-04 MED ORDER — PREDNISONE 10 MG PO TABS: ORAL_TABLET | ORAL | 0 refills | Status: AC

## 2023-01-05 NOTE — Telephone Encounter (Signed)
Mindy: what is status of referral to [facility]?

## 2023-01-05 NOTE — Telephone Encounter (Signed)
[facility] and they are trying to coordinate an appointment for her. They will reach out once the provider let's them know when.

## 2023-01-06 ENCOUNTER — Other Ambulatory Visit: Payer: Self-pay

## 2023-01-06 DIAGNOSIS — K5080 Crohn's disease of both small and large intestine without complications: Secondary | ICD-10-CM | POA: Diagnosis not present

## 2023-01-06 DIAGNOSIS — D509 Iron deficiency anemia, unspecified: Secondary | ICD-10-CM | POA: Diagnosis not present

## 2023-01-07 LAB — CBC WITH DIFFERENTIAL/PLATELET
Basophils Absolute: 0 10*3/uL (ref 0.0–0.2)
Basos: 0 %
EOS (ABSOLUTE): 0 10*3/uL (ref 0.0–0.4)
Eos: 0 %
Hematocrit: 36.6 % (ref 34.0–46.6)
Hemoglobin: 11.2 g/dL (ref 11.1–15.9)
Immature Grans (Abs): 0 10*3/uL (ref 0.0–0.1)
Immature Granulocytes: 1 %
Lymphocytes Absolute: 2.5 10*3/uL (ref 0.7–3.1)
Lymphs: 30 %
MCH: 27.1 pg (ref 26.6–33.0)
MCHC: 30.6 g/dL — ABNORMAL LOW (ref 31.5–35.7)
MCV: 88 fL (ref 79–97)
Monocytes Absolute: 0.3 10*3/uL (ref 0.1–0.9)
Monocytes: 3 %
Neutrophils Absolute: 5.4 10*3/uL (ref 1.4–7.0)
Neutrophils: 66 %
Platelets: 571 10*3/uL — ABNORMAL HIGH (ref 150–450)
RBC: 4.14 x10E6/uL (ref 3.77–5.28)
RDW: 13.9 % (ref 11.7–15.4)
WBC: 8.2 10*3/uL (ref 3.4–10.8)

## 2023-01-07 LAB — COMPREHENSIVE METABOLIC PANEL
ALT: 10 [IU]/L (ref 0–32)
AST: 13 [IU]/L (ref 0–40)
Albumin: 3.4 g/dL — ABNORMAL LOW (ref 3.8–4.9)
Alkaline Phosphatase: 93 [IU]/L (ref 44–121)
BUN/Creatinine Ratio: 13 (ref 9–23)
BUN: 16 mg/dL (ref 6–24)
Bilirubin Total: <0.2 mg/dL (ref 0.0–1.2)
CO2: 19 mmol/L — ABNORMAL LOW (ref 20–29)
Calcium: 8.6 mg/dL — ABNORMAL LOW (ref 8.7–10.2)
Chloride: 103 mmol/L (ref 96–106)
Creatinine, Ser: 1.21 mg/dL — ABNORMAL HIGH (ref 0.57–1.00)
Globulin, Total: 2.8 g/dL (ref 1.5–4.5)
Glucose: 167 mg/dL — ABNORMAL HIGH (ref 70–99)
Potassium: 5.6 mmol/L — ABNORMAL HIGH (ref 3.5–5.2)
Sodium: 138 mmol/L (ref 134–144)
Total Protein: 6.2 g/dL (ref 6.0–8.5)
eGFR: 54 mL/min/{1.73_m2} — ABNORMAL LOW (ref >59)

## 2023-01-10 ENCOUNTER — Encounter: Payer: Self-pay | Admitting: *Deleted

## 2023-01-10 ENCOUNTER — Telehealth: Payer: Self-pay

## 2023-01-10 NOTE — Telephone Encounter (Signed)
FYI

## 2023-01-10 NOTE — Telephone Encounter (Signed)
Transition Care Management Unsuccessful Follow-up Telephone Call  Date of discharge and from where:  [name] 9/30  Attempts:  1st Attempt  Reason for unsuccessful TCM follow-up call:  No answer/busy   Lenard Forth [place]  [facility], [facility] Guide, Phone: [phone] Website: [email or web address]

## 2023-01-11 ENCOUNTER — Telehealth: Payer: Self-pay

## 2023-01-11 ENCOUNTER — Encounter: Payer: Self-pay | Admitting: Family Medicine

## 2023-01-11 NOTE — Telephone Encounter (Signed)
Transition Care Management Unsuccessful Follow-up Telephone Call  Date of discharge and from where:  [facility]  Attempts:  2nd Attempt  Reason for unsuccessful TCM follow-up call:  No answer/busy   Lenard Forth [place]  [facility], [facility] Guide, Phone: [phone] Website: [email or web address]

## 2023-01-12 ENCOUNTER — Other Ambulatory Visit: Payer: Self-pay

## 2023-01-12 DIAGNOSIS — K5080 Crohn's disease of both small and large intestine without complications: Secondary | ICD-10-CM

## 2023-01-14 ENCOUNTER — Other Ambulatory Visit: Payer: Self-pay | Admitting: Hematology

## 2023-01-16 ENCOUNTER — Encounter: Payer: Self-pay | Admitting: Gastroenterology

## 2023-01-16 ENCOUNTER — Encounter (HOSPITAL_COMMUNITY): Payer: Self-pay | Admitting: Hematology

## 2023-01-18 ENCOUNTER — Other Ambulatory Visit: Payer: Self-pay | Admitting: Family Medicine

## 2023-01-19 ENCOUNTER — Ambulatory Visit: Payer: Medicare HMO | Admitting: Professional Counselor

## 2023-01-19 DIAGNOSIS — K5080 Crohn's disease of both small and large intestine without complications: Secondary | ICD-10-CM | POA: Diagnosis not present

## 2023-01-19 DIAGNOSIS — F330 Major depressive disorder, recurrent, mild: Secondary | ICD-10-CM

## 2023-01-20 LAB — BASIC METABOLIC PANEL
BUN/Creatinine Ratio: 18 (ref 9–23)
BUN: 18 mg/dL (ref 6–24)
CO2: 21 mmol/L (ref 20–29)
Calcium: 9.2 mg/dL (ref 8.7–10.2)
Chloride: 98 mmol/L (ref 96–106)
Creatinine, Ser: 1 mg/dL (ref 0.57–1.00)
Glucose: 109 mg/dL — ABNORMAL HIGH (ref 70–99)
Potassium: 4 mmol/L (ref 3.5–5.2)
Sodium: 143 mmol/L (ref 134–144)
eGFR: 67 mL/min/{1.73_m2} (ref >59)

## 2023-01-20 LAB — C-REACTIVE PROTEIN: CRP: 11 mg/L — ABNORMAL HIGH (ref 0–10)

## 2023-01-20 LAB — SEDIMENTATION RATE: Sed Rate: 7 mm/h (ref 0–40)

## 2023-01-20 NOTE — BH Specialist Note (Signed)
[place] [facility] Telephone Follow-up  MRN: [id number] NAME: ELETA KOLBE Date: 01/20/23  Start time: Start Time: 1000 End time: Stop Time: 1030 Total time: Total Time in Minutes (Visit): 30 Call number: Visit Number: Additional Visit  Reason for call today:  The patient is a 52 year old female presenting for a collaborative care follow-up via telehealth. She reports feeling relatively well over the past couple of weeks but has experienced intermittent depressive episodes as she continues to process the loss of her son. She expresses ambivalence about obtaining the full autopsy report, believing it could bring closure but also fearing it would make the loss feel more final and overwhelming. The patient describes battling different stages of grief but remains actively engaged in grief support groups and reaches out for help when needed.  Currently living alone, she notes that the solitude has been helpful, especially as her husband is staying with his brother due to ongoing legal issues. Despite her grief, she finds solace and purpose through work and social interactions, which help her maintain a sense of structure and function. While she has been coping relatively well, her depressive symptoms have increased slightly, warranting close monitoring. A follow-up is scheduled for next week to continue addressing her symptoms, explore her ambivalence about the autopsy report, and support her ongoing efforts in managing her grief.   PHQ-9 Scores:     12/23/2022    9:52 AM 11/18/2022   10:17 AM 10/21/2022    9:11 AM 10/20/2022   11:55 AM 10/13/2022    9:20 AM  Depression screen PHQ 2/9  Decreased Interest 1 1 1 1 1   Down, Depressed, Hopeless 1 1 0 0 0  PHQ - 2 Score 2 2 1 1 1   Altered sleeping 2 2 2 2 2   Tired, decreased energy 1 1 0 1 1  Change in appetite 0 0 0 0 0  Feeling bad or failure about yourself  0 1 0 0 0  Trouble concentrating 0 0 0 0 0  Moving slowly or fidgety/restless 0 0 0 0  0  Suicidal thoughts 0 0 0 0 0  PHQ-9 Score 5 6 3 4 4   Difficult doing work/chores Somewhat difficult  Somewhat difficult Somewhat difficult Somewhat difficult   GAD-7 Scores:     12/23/2022    9:53 AM 11/18/2022   10:18 AM 10/21/2022    9:11 AM 10/20/2022   11:56 AM  GAD 7 : Generalized Anxiety Score  Nervous, Anxious, on Edge 0 0 0 0  Control/stop worrying 0 0 0 1  Worry too much - different things 1 1 0 1  Trouble relaxing 1 1 1  0  Restless 0 0 1 0  Easily annoyed or irritable 0 1 0 0  Afraid - awful might happen 0 0 0 0  Total GAD 7 Score 2 3 2 2   Anxiety Difficulty Somewhat difficult Somewhat difficult Somewhat difficult Not difficult at all    Stress Current stressors:  Grief Sleep:  Good Appetite:  good Coping ability:  good Patient taking medications as prescribed:  Yes  Current medications:  Outpatient Encounter Medications as of 01/19/2023  Medication Sig   calcium-vitamin D (OSCAL WITH D) 500-200 MG-UNIT tablet Take 1 tablet by mouth daily.   dicyclomine (BENTYL) 10 MG capsule Take 1 capsule (10 mg total) by mouth 4 (four) times daily -  before meals and at bedtime. For stomach cramping. Stop if constipation. Please use sparingly as can slow intestinal transit.   hydrOXYzine (  ATARAX) 50 MG tablet TAKE 2 TABLETS BY MOUTH AT BEDTIME FOR SLEEP   magnesium oxide (MAG-OX) 400 (240 Mg) MG tablet Take 400 mg by mouth daily.   methocarbamol (ROBAXIN) 500 MG tablet TAKE 1 TABLET(500 MG) BY MOUTH EVERY 8 HOURS AS NEEDED FOR MUSCLE SPASMS   Multiple Vitamin (MULTIVITAMIN WITH MINERALS) TABS tablet Take 1 tablet by mouth daily.   olmesartan (BENICAR) 20 MG tablet Take 1 tablet (20 mg total) by mouth daily.   Omega-3 Fatty Acids (FISH OIL) 1200 MG CAPS Take 1,200 mg by mouth daily.    ondansetron (ZOFRAN-ODT) 4 MG disintegrating tablet Take 1 tablet (4 mg total) by mouth every 8 (eight) hours as needed for nausea or vomiting.   pantoprazole (PROTONIX) 40 MG tablet Take 1 tablet  (40 mg total) by mouth daily.   predniSONE (DELTASONE) 10 MG tablet Take 1 tablet (10 mg total) by mouth daily with breakfast.   predniSONE (DELTASONE) 10 MG tablet Take 3 tablets (30 mg total) by mouth daily with breakfast for 14 days, THEN 2 tablets (20 mg total) daily with breakfast for 14 days, THEN 1 tablet (10 mg total) daily with breakfast for 14 days.   predniSONE (STERAPRED UNI-PAK 21 TAB) 10 MG (21) TBPK tablet Take by mouth daily. Take 6 tabs by mouth daily  for 2 days, then 5 tabs for 2 days, then 4 tabs for 2 days, then 3 tabs for 2 days, 2 tabs for 2 days, then 1 tab by mouth daily for 2 days   Probiotic Product (PROBIOTIC PO) Take 1 tablet by mouth daily.   Pyridoxine HCl (VITAMIN B6 PO) Take 1 tablet by mouth daily.   Ustekinumab (STELARA [place]) Inject 90 mg into the skin as directed. Every 8 weeks   venlafaxine XR (EFFEXOR-XR) 37.5 MG 24 hr capsule TAKE 1 CAPSULE(37.5 MG) BY MOUTH DAILY WITH BREAKFAST (Patient taking differently: Take 37.5 mg by mouth daily with breakfast.)   vitamin C (ASCORBIC ACID) 500 MG tablet Take 500 mg by mouth daily.   Vitamin D, Ergocalciferol, (DRISDOL) 1.25 MG (50000 UNIT) CAPS capsule TAKE ONE CAPSULE BY MOUTH ONCE A WEEK   [DISCONTINUED] methocarbamol (ROBAXIN) 500 MG tablet Take 1 tablet (500 mg total) by mouth 3 (three) times daily.   No facility-administered encounter medications on file as of 01/19/2023.     Self-harm Behaviors Risk Assessment Self-harm risk factors:  Past attempts, depression, grief Patient endorses recent thoughts of harming self:  Denies   Danger to Others Risk Assessment Danger to others risk factors:  None Patient endorses recent thoughts of harming others:  Denies   Substance Use Assessment Patient recently consumed alcohol:  NO  Alcohol Use Disorder Identification Test (AUDIT):     08/19/2020    3:57 PM 08/24/2021    9:53 AM 07/22/2022    5:52 AM 08/29/2022    9:18 AM 12/20/2022    3:31 PM  Alcohol Use Disorder  Test (AUDIT)  1. How often do you have a drink containing alcohol? 0 0 0 0 0  2. How many drinks containing alcohol do you have on a typical day when you are drinking? 0 0     3. How often do you have six or more drinks on one occasion? 0 0 0 0 0  AUDIT-C Score 0 0       Goals, Interventions and Follow-up Plan Goals: Increase healthy adjustment to current life circumstances Interventions: Behavioral Activation and CBT Cognitive Behavioral Therapy Follow-up Plan:  follow  up in 1 week    Reuel Boom

## 2023-01-24 DIAGNOSIS — K50012 Crohn's disease of small intestine with intestinal obstruction: Secondary | ICD-10-CM | POA: Diagnosis not present

## 2023-01-24 DIAGNOSIS — Z87898 Personal history of other specified conditions: Secondary | ICD-10-CM | POA: Diagnosis not present

## 2023-01-24 DIAGNOSIS — K5080 Crohn's disease of both small and large intestine without complications: Secondary | ICD-10-CM | POA: Diagnosis not present

## 2023-01-27 ENCOUNTER — Ambulatory Visit: Payer: Medicare HMO | Admitting: Professional Counselor

## 2023-01-27 DIAGNOSIS — F330 Major depressive disorder, recurrent, mild: Secondary | ICD-10-CM

## 2023-01-27 NOTE — BH Specialist Note (Unsigned)
[place] [facility] BH Telephone Follow-up  MRN: [id number] NAME: [name] Date: 01/31/23  Start time: Start Time: 0400 End time: Stop Time: 0430 Total time: Total Time in Minutes (Visit): 30 Call number: Visit Number: Additional Visit  Reason for call today: The patient is a 52 year old female returning for a collaborative care follow-up. She continues to navigate the grief process following the recent suicide of her son, with the upcoming holidays amplifying her thoughts and emotions. She reports that her family is also deeply impacted, and she is experiencing conflicts with her daughter, particularly regarding communication and efforts to establish boundaries. Despite these challenges, the patient continues to function well and actively works to process her grief.  She shares conflicting feelings about awaiting her son's autopsy report. While part of her wants closure, another part feels unprepared to fully accept the finality of his death. Supportive counseling remains focused on helping her manage these conflicting emotions, maintain boundaries, and continue progressing through her grief journey.  The patient expresses that collaborative care has been beneficial, providing her with emotional support during this difficult time. Follow-up is scheduled in two weeks to monitor her progress and provide ongoing support as she navigates her grief and family dynamics.   PHQ-9 Scores:     01/27/2023    4:06 PM 12/23/2022    9:52 AM 11/18/2022   10:17 AM 10/21/2022    9:11 AM 10/20/2022   11:55 AM  Depression screen PHQ 2/9  Decreased Interest 1 1 1 1 1   Down, Depressed, Hopeless 1 1 1  0 0  PHQ - 2 Score 2 2 2 1 1   Altered sleeping 2 2 2 2 2   Tired, decreased energy 1 1 1  0 1  Change in appetite 0 0 0 0 0  Feeling bad or failure about yourself  0 0 1 0 0  Trouble concentrating 0 0 0 0 0  Moving slowly or fidgety/restless 0 0 0 0 0  Suicidal thoughts 0 0 0 0 0  PHQ-9 Score 5 5 6 3 4    Difficult doing work/chores Somewhat difficult Somewhat difficult  Somewhat difficult Somewhat difficult   GAD-7 Scores:     01/27/2023    4:07 PM 12/23/2022    9:53 AM 11/18/2022   10:18 AM 10/21/2022    9:11 AM  GAD 7 : Generalized Anxiety Score  Nervous, Anxious, on Edge 0 0 0 0  Control/stop worrying 0 0 0 0  Worry too much - different things 0 1 1 0  Trouble relaxing 1 1 1 1   Restless 0 0 0 1  Easily annoyed or irritable 1 0 1 0  Afraid - awful might happen 0 0 0 0  Total GAD 7 Score 2 2 3 2   Anxiety Difficulty Not difficult at all Somewhat difficult Somewhat difficult Somewhat difficult    Stress Current stressors:  Work, grief, marriage Sleep:  Good Appetite:  Good Coping ability:  Good Patient taking medications as prescribed:  Yes  Current medications:  Outpatient Encounter Medications as of 01/27/2023  Medication Sig   calcium-vitamin D (OSCAL WITH D) 500-200 MG-UNIT tablet Take 1 tablet by mouth daily.   dicyclomine (BENTYL) 10 MG capsule Take 1 capsule (10 mg total) by mouth 4 (four) times daily -  before meals and at bedtime. For stomach cramping. Stop if constipation. Please use sparingly as can slow intestinal transit.   hydrOXYzine (ATARAX) 50 MG tablet TAKE 2 TABLETS BY MOUTH AT BEDTIME FOR SLEEP   magnesium  oxide (MAG-OX) 400 (240 Mg) MG tablet Take 400 mg by mouth daily.   methocarbamol (ROBAXIN) 500 MG tablet TAKE 1 TABLET(500 MG) BY MOUTH EVERY 8 HOURS AS NEEDED FOR MUSCLE SPASMS   Multiple Vitamin (MULTIVITAMIN WITH MINERALS) TABS tablet Take 1 tablet by mouth daily.   olmesartan (BENICAR) 20 MG tablet Take 1 tablet (20 mg total) by mouth daily.   Omega-3 Fatty Acids (FISH OIL) 1200 MG CAPS Take 1,200 mg by mouth daily.    ondansetron (ZOFRAN-ODT) 4 MG disintegrating tablet Take 1 tablet (4 mg total) by mouth every 8 (eight) hours as needed for nausea or vomiting.   pantoprazole (PROTONIX) 40 MG tablet Take 1 tablet (40 mg total) by mouth daily.    predniSONE (DELTASONE) 10 MG tablet Take 1 tablet (10 mg total) by mouth daily with breakfast.   predniSONE (DELTASONE) 10 MG tablet Take 3 tablets (30 mg total) by mouth daily with breakfast for 14 days, THEN 2 tablets (20 mg total) daily with breakfast for 14 days, THEN 1 tablet (10 mg total) daily with breakfast for 14 days.   predniSONE (STERAPRED UNI-PAK 21 TAB) 10 MG (21) TBPK tablet Take by mouth daily. Take 6 tabs by mouth daily  for 2 days, then 5 tabs for 2 days, then 4 tabs for 2 days, then 3 tabs for 2 days, 2 tabs for 2 days, then 1 tab by mouth daily for 2 days   Probiotic Product (PROBIOTIC PO) Take 1 tablet by mouth daily.   Pyridoxine HCl (VITAMIN B6 PO) Take 1 tablet by mouth daily.   Ustekinumab (STELARA [place]) Inject 90 mg into the skin as directed. Every 8 weeks   venlafaxine XR (EFFEXOR-XR) 37.5 MG 24 hr capsule TAKE 1 CAPSULE(37.5 MG) BY MOUTH DAILY WITH BREAKFAST (Patient taking differently: Take 37.5 mg by mouth daily with breakfast.)   vitamin C (ASCORBIC ACID) 500 MG tablet Take 500 mg by mouth daily.   Vitamin D, Ergocalciferol, (DRISDOL) 1.25 MG (50000 UNIT) CAPS capsule TAKE ONE CAPSULE BY MOUTH ONCE A WEEK   [DISCONTINUED] methocarbamol (ROBAXIN) 500 MG tablet Take 1 tablet (500 mg total) by mouth 3 (three) times daily.   No facility-administered encounter medications on file as of 01/27/2023.     Self-harm Behaviors Risk Assessment Self-harm risk factors:  Past thoughts Patient endorses recent thoughts of harming self:  Denies   Danger to Others Risk Assessment Danger to others risk factors:  None Patient endorses recent thoughts of harming others:  Denies   Goals, Interventions and Follow-up Plan Goals:  Increase self care and Improve communication with daughter Interventions: Behavioral Activation, CBT Cognitive Behavioral Therapy, and Supportive Counseling Follow-up Plan:  Bi-weekly follow ups   Reuel Boom

## 2023-01-30 DIAGNOSIS — R590 Localized enlarged lymph nodes: Secondary | ICD-10-CM | POA: Diagnosis not present

## 2023-01-30 DIAGNOSIS — K5080 Crohn's disease of both small and large intestine without complications: Secondary | ICD-10-CM | POA: Diagnosis not present

## 2023-01-30 DIAGNOSIS — K50914 Crohn's disease, unspecified, with abscess: Secondary | ICD-10-CM | POA: Diagnosis not present

## 2023-01-31 ENCOUNTER — Other Ambulatory Visit: Payer: Self-pay | Admitting: Family Medicine

## 2023-02-07 ENCOUNTER — Other Ambulatory Visit: Payer: Self-pay | Admitting: Family Medicine

## 2023-02-07 ENCOUNTER — Other Ambulatory Visit: Payer: Self-pay | Admitting: Gastroenterology

## 2023-02-08 ENCOUNTER — Ambulatory Visit: Payer: Medicare HMO | Admitting: Gastroenterology

## 2023-02-09 ENCOUNTER — Ambulatory Visit: Payer: Medicare HMO | Admitting: Professional Counselor

## 2023-02-11 ENCOUNTER — Other Ambulatory Visit: Payer: Self-pay | Admitting: Family Medicine

## 2023-02-15 DIAGNOSIS — Z885 Allergy status to narcotic agent status: Secondary | ICD-10-CM | POA: Diagnosis not present

## 2023-02-15 DIAGNOSIS — K50012 Crohn's disease of small intestine with intestinal obstruction: Secondary | ICD-10-CM | POA: Diagnosis not present

## 2023-02-15 DIAGNOSIS — R109 Unspecified abdominal pain: Secondary | ICD-10-CM | POA: Diagnosis not present

## 2023-02-16 ENCOUNTER — Other Ambulatory Visit: Payer: Self-pay | Admitting: Family Medicine

## 2023-02-24 ENCOUNTER — Encounter: Payer: Self-pay | Admitting: Family Medicine

## 2023-02-24 ENCOUNTER — Ambulatory Visit (INDEPENDENT_AMBULATORY_CARE_PROVIDER_SITE_OTHER): Payer: Medicare HMO | Admitting: Family Medicine

## 2023-02-24 VITALS — BP 121/77 | HR 92 | Ht 67.0 in | Wt 200.1 lb

## 2023-02-24 DIAGNOSIS — I10 Essential (primary) hypertension: Secondary | ICD-10-CM

## 2023-02-24 DIAGNOSIS — E663 Overweight: Secondary | ICD-10-CM | POA: Diagnosis not present

## 2023-02-24 DIAGNOSIS — Z23 Encounter for immunization: Secondary | ICD-10-CM

## 2023-02-24 DIAGNOSIS — Z0000 Encounter for general adult medical examination without abnormal findings: Secondary | ICD-10-CM

## 2023-02-24 DIAGNOSIS — E559 Vitamin D deficiency, unspecified: Secondary | ICD-10-CM

## 2023-02-24 DIAGNOSIS — Z13220 Encounter for screening for lipoid disorders: Secondary | ICD-10-CM | POA: Diagnosis not present

## 2023-02-24 MED ORDER — HYDROXYZINE PAMOATE 100 MG PO CAPS: ORAL_CAPSULE | ORAL | 5 refills | Status: DC

## 2023-02-24 NOTE — Progress Notes (Signed)
    [name]     MRN: [id number]      DOB: 1970-09-28  Chief Complaint  Patient presents with   Annual Exam    CPE flu shot    HPI: Patient is in for annual physical exam. No other health concerns are expressed or addressed at the visit. Recent labs,  are reviewed. Immunization is reviewed , and  updated if needed.   PE: BP 121/77 (BP Location: Right Arm, Patient Position: Sitting, Cuff Size: Large)   Pulse 92   Ht 5\' 7"  (1.702 m)   Wt 200 lb 1.9 oz (90.8 kg)   SpO2 95%   BMI 31.34 kg/m   Pleasant  female, alert and oriented x 3, in no cardio-pulmonary distress. Afebrile. HEENT No facial trauma or asymetry. Sinuses non tender.  Extra occullar muscles intact.. External ears normal, . Neck: supple, no adenopathy,JVD or thyromegaly.No bruits.  Chest: Clear to ascultation bilaterally.No crackles or wheezes. Non tender to palpation  Breast: Not examined , asymptomatic  Cardiovascular system; Heart sounds normal,  S1 and  S2 ,no S3.  No murmur, or thrill. Apical beat not displaced Peripheral pulses normal.  Abdomen: Soft, mild superficial tenderness , no guarding or rebound.   GU: Asymptomatic , not examines  Musculoskeletal exam: Full ROM of spine, hips , shoulders and knees. No deformity ,swelling or crepitus noted. No muscle wasting or atrophy.   Neurologic: Cranial nerves 2 to 12 intact. Power, tone , normal throughout. No disturbance in gait. No tremor.  Skin: Intact, no ulceration, erythema , scaling or rash noted. Pigmentation normal throughout  Psych; Normal mood and affect. Judgement and concentration normal   Assessment & Plan:  Annual physical exam Annual exam as documented. Counseling done  re healthy lifestyle involving commitment to 150 minutes exercise per week, heart healthy diet, and attaining healthy weight.The importance of adequate sleep also discussed.  Immunization and cancer screening needs are specifically addressed at  this visit.   Immunization due After obtaining informed consent, the influenza  vaccine is  administered , with no adverse effect noted at the time of administration.

## 2023-02-24 NOTE — Patient Instructions (Addendum)
F/u in 4 months, call if you need me sooner  Flu vaccine today  Fasting lipid, cmp and EGFR , TSH, magnesium , vit D in next 1 week  All the best with upcomming surgery  Thanks for choosing [facility], we consider it a privelige to serve you.

## 2023-02-24 NOTE — Assessment & Plan Note (Signed)
Annual exam as documented. Counseling done  re healthy lifestyle involving commitment to 150 minutes exercise per week, heart healthy diet, and attaining healthy weight.The importance of adequate sleep also discussed.  Immunization and cancer screening needs are specifically addressed at this visit.  

## 2023-02-24 NOTE — Assessment & Plan Note (Signed)
After obtaining informed consent, the influenza vaccine is  administered , with no adverse effect noted at the time of administration.

## 2023-03-02 ENCOUNTER — Encounter: Payer: Medicare HMO | Admitting: Professional Counselor

## 2023-03-02 NOTE — Progress Notes (Unsigned)
Virtual Visit via Video Note  I connected with [name] on 03/02/23 at  4:00 PM EST by a video enabled telemedicine application and verified that I am speaking with the correct person using two identifiers.  {Patient Location:[phone]::"Home"} {Provider Location:([phone]::"Home Office"}  I discussed the limitations, risks, security, and privacy concerns of performing an evaluation and management service by video and the availability of in person appointments. I also discussed with the patient that there may be a patient responsible charge related to this service. The patient expressed understanding and agreed to proceed.  Subjective: PCP: Kerri Perches, MD  No chief complaint on file.  HPI   ROS: Per HPI  Current Outpatient Medications:    budesonide (ENTOCORT EC) 3 MG 24 hr capsule, Take by mouth., Disp: , Rfl:    calcium-vitamin D (OSCAL WITH D) 500-200 MG-UNIT tablet, Take 1 tablet by mouth daily., Disp: , Rfl:    dicyclomine (BENTYL) 10 MG capsule, Take 1 capsule (10 mg total) by mouth 4 (four) times daily -  before meals and at bedtime. For stomach cramping. Stop if constipation. Please use sparingly as can slow intestinal transit., Disp: 120 capsule, Rfl: 1   hydrOXYzine (VISTARIL) 100 MG capsule, Take one capsule by mouth every night for sleep and anxiety, Disp: 30 capsule, Rfl: 5   magnesium oxide (MAG-OX) 400 (240 Mg) MG tablet, Take 400 mg by mouth daily., Disp: , Rfl:    methocarbamol (ROBAXIN) 500 MG tablet, TAKE 1 TABLET(500 MG) BY MOUTH EVERY 8 HOURS AS NEEDED FOR MUSCLE SPASMS, Disp: 30 tablet, Rfl: 1   Multiple Vitamin (MULTIVITAMIN WITH MINERALS) TABS tablet, Take 1 tablet by mouth daily., Disp: , Rfl:    Omega-3 Fatty Acids (FISH OIL) 1200 MG CAPS, Take 1,200 mg by mouth daily. , Disp: , Rfl:    ondansetron (ZOFRAN-ODT) 4 MG disintegrating tablet, DISSOLVE 1 TABLET(4 MG) ON THE TONGUE EVERY 8 HOURS AS NEEDED FOR NAUSEA OR VOMITING, Disp: 30 tablet,  Rfl: 0   pantoprazole (PROTONIX) 40 MG tablet, Take 1 tablet (40 mg total) by mouth daily., Disp: 90 tablet, Rfl: 3   predniSONE (DELTASONE) 10 MG tablet, Take 1 tablet (10 mg total) by mouth daily with breakfast., Disp: 60 tablet, Rfl: 0   Probiotic Product (PROBIOTIC PO), Take 1 tablet by mouth daily., Disp: , Rfl:    Pyridoxine HCl (VITAMIN B6 PO), Take 1 tablet by mouth daily., Disp: , Rfl:    Ustekinumab (STELARA Delton), Inject 90 mg into the skin as directed. Every 8 weeks, Disp: , Rfl:    venlafaxine XR (EFFEXOR-XR) 37.5 MG 24 hr capsule, TAKE 1 CAPSULE(37.5 MG) BY MOUTH DAILY WITH BREAKFAST, Disp: 30 capsule, Rfl: 3   vitamin C (ASCORBIC ACID) 500 MG tablet, Take 500 mg by mouth daily., Disp: , Rfl:    Vitamin D, Ergocalciferol, (DRISDOL) 1.25 MG (50000 UNIT) CAPS capsule, TAKE ONE CAPSULE BY MOUTH ONCE A WEEK, Disp: 16 capsule, Rfl: 0  Observations/Objective: There were no vitals filed for this visit. Physical Exam  Assessment and Plan: There are no diagnoses linked to this encounter.  Follow Up Instructions: No follow-ups on file.   I discussed the assessment and treatment plan with the patient. The patient was provided an opportunity to ask questions, and all were answered. The patient agreed with the plan and demonstrated an understanding of the instructions.   The patient was advised to call back or seek an in-person evaluation if the symptoms worsen or if the condition  fails to improve as anticipated.  The above assessment and management plan was discussed with the patient. The patient verbalized understanding of and has agreed to the management plan.   Cruzita Lederer Newman Nip, FNP

## 2023-03-03 ENCOUNTER — Ambulatory Visit: Payer: Medicare HMO | Admitting: Professional Counselor

## 2023-03-03 ENCOUNTER — Telehealth: Payer: Medicare HMO | Admitting: Family Medicine

## 2023-03-04 ENCOUNTER — Other Ambulatory Visit: Payer: Self-pay | Admitting: Family Medicine

## 2023-03-10 DIAGNOSIS — Z87891 Personal history of nicotine dependence: Secondary | ICD-10-CM | POA: Diagnosis not present

## 2023-03-10 DIAGNOSIS — K644 Residual hemorrhoidal skin tags: Secondary | ICD-10-CM | POA: Diagnosis not present

## 2023-03-10 DIAGNOSIS — Z980 Intestinal bypass and anastomosis status: Secondary | ICD-10-CM | POA: Diagnosis not present

## 2023-03-10 DIAGNOSIS — Z885 Allergy status to narcotic agent status: Secondary | ICD-10-CM | POA: Diagnosis not present

## 2023-03-10 DIAGNOSIS — K9189 Other postprocedural complications and disorders of digestive system: Secondary | ICD-10-CM | POA: Diagnosis not present

## 2023-03-10 DIAGNOSIS — K529 Noninfective gastroenteritis and colitis, unspecified: Secondary | ICD-10-CM | POA: Diagnosis not present

## 2023-03-10 DIAGNOSIS — K219 Gastro-esophageal reflux disease without esophagitis: Secondary | ICD-10-CM | POA: Diagnosis not present

## 2023-03-10 DIAGNOSIS — Z79899 Other long term (current) drug therapy: Secondary | ICD-10-CM | POA: Diagnosis not present

## 2023-03-10 DIAGNOSIS — K5080 Crohn's disease of both small and large intestine without complications: Secondary | ICD-10-CM | POA: Diagnosis not present

## 2023-03-15 DIAGNOSIS — K50012 Crohn's disease of small intestine with intestinal obstruction: Secondary | ICD-10-CM | POA: Diagnosis not present

## 2023-03-16 DIAGNOSIS — K50012 Crohn's disease of small intestine with intestinal obstruction: Secondary | ICD-10-CM | POA: Diagnosis not present

## 2023-03-16 DIAGNOSIS — J9811 Atelectasis: Secondary | ICD-10-CM | POA: Diagnosis not present

## 2023-03-16 DIAGNOSIS — R509 Fever, unspecified: Secondary | ICD-10-CM | POA: Diagnosis not present

## 2023-03-16 DIAGNOSIS — Z8719 Personal history of other diseases of the digestive system: Secondary | ICD-10-CM | POA: Diagnosis not present

## 2023-03-16 DIAGNOSIS — Z87891 Personal history of nicotine dependence: Secondary | ICD-10-CM | POA: Diagnosis not present

## 2023-03-16 DIAGNOSIS — I10 Essential (primary) hypertension: Secondary | ICD-10-CM | POA: Diagnosis not present

## 2023-03-16 DIAGNOSIS — G8918 Other acute postprocedural pain: Secondary | ICD-10-CM | POA: Diagnosis not present

## 2023-03-20 ENCOUNTER — Other Ambulatory Visit: Payer: Self-pay | Admitting: Family Medicine

## 2023-03-23 ENCOUNTER — Other Ambulatory Visit: Payer: Self-pay | Admitting: Family Medicine

## 2023-03-24 ENCOUNTER — Telehealth: Payer: Self-pay

## 2023-03-24 NOTE — Transitions of Care (Post Inpatient/ED Visit) (Signed)
   03/24/2023  Name: [name] MRN: [id number] DOB: 03-Jul-1970  Today's TOC FU Call Status: Today's TOC FU Call Status:: Unsuccessful Call (1st Attempt) Unsuccessful Call (1st Attempt) Date: 03/24/23  Attempted to reach the patient regarding the most recent Inpatient/ED visit.  Follow Up Plan: Additional outreach attempts will be made to reach the patient to complete the Transitions of Care (Post Inpatient/ED visit) call.   Lonia Chimera, RN, BSN, CEN [employer]- Transition of Care Team.  [facility] [phone]

## 2023-03-27 ENCOUNTER — Ambulatory Visit: Payer: Medicare HMO | Admitting: "Endocrinology

## 2023-03-27 ENCOUNTER — Telehealth: Payer: Self-pay | Admitting: *Deleted

## 2023-03-27 NOTE — Transitions of Care (Post Inpatient/ED Visit) (Signed)
   03/27/2023  Name: [name] MRN: [id number] DOB: [birth year]  Today's TOC FU Call Status: Today's TOC FU Call Status:: Unsuccessful Call (2nd Attempt) Unsuccessful Call (2nd Attempt) Date: 03/27/23  Attempted to reach the patient regarding the most recent Inpatient/ED visit.  Follow Up Plan: Additional outreach attempts will be made to reach the patient to complete the Transitions of Care (Post Inpatient/ED visit) call.   Gean Maidens BSN RN [facility] Health Care Management Coordinator [email or web address] Direct Dial: [phone]  Fax: [phone] Website: [email or web address]

## 2023-03-27 NOTE — Transitions of Care (Post Inpatient/ED Visit) (Signed)
03/27/2023  Name: [name] MRN: [id number] DOB: 1970-03-26  Today's TOC FU Call Status: Today's TOC FU Call Status:: Successful TOC FU Call Completed TOC FU Call Complete Date: 03/27/23 Patient's Name and Date of Birth confirmed.  Transition Care Management Follow-up Telephone Call Date of Discharge: 03/23/23 Discharge Facility: Other [facility]) Name of Other (Non[facility]) Discharge Facility: [facility] Type of Discharge: Inpatient Admission Primary Inpatient Discharge Diagnosis:: Crohn's disease of small intestine with intestinal obstruction How have you been since you were released from the hospital?: Better Any questions or concerns?: Yes Patient Questions/Concerns:: Patient is having difficulty keeping bags on and having to change frequently. Patient Questions/Concerns Addressed: Other: (Patient will call surgeon office and request a home healthj nurse to come and assist)  Items Reviewed: Did you receive and understand the discharge instructions provided?: Yes Medications obtained,verified, and reconciled?: Yes (Medications Reviewed) Any new allergies since your discharge?: No Dietary orders reviewed?: No Do you have support at home?: Yes People in Home: child(ren), adult Name of Support/Comfort Primary Source: [name]  Medications Reviewed Today: Medications Reviewed Today     Reviewed by Luella Cook, RN (Case Manager) on 03/27/23 at 1424  Med List Status: <None>   Medication Order Taking? Sig Documenting Provider Last Dose Status Informant  budesonide (ENTOCORT EC) 3 MG 24 hr capsule [id number] Yes Take by mouth. [provider] Taking Active   calcium-vitamin D (OSCAL WITH D) 500-200 MG-UNIT tablet [id number] Yes Take 1 tablet by mouth daily. [provider] Taking Active Self           Med Note Tyrell Antonio   Sat Sep 17, 2022  4:41 PM)    dicyclomine (BENTYL) 10 MG capsule [id number] Yes Take 1 capsule (10 mg total) by mouth 4 (four)  times daily -  before meals and at bedtime. For stomach cramping. Stop if constipation. Please use sparingly as can slow intestinal transit. Gelene Mink, NP Taking Active   hydrOXYzine (VISTARIL) 100 MG capsule [id number] Yes Take one capsule by mouth every night for sleep and anxiety Kerri Perches, MD Taking Active   magnesium oxide (MAG-OX) 400 (240 Mg) MG tablet [id number] Yes Take 400 mg by mouth daily. [provider] Taking Active Self    Discontinued 11/19/22 0057   methocarbamol (ROBAXIN) 500 MG tablet [id number]  TAKE 1 TABLET(500 MG) BY MOUTH EVERY 8 HOURS AS NEEDED FOR MUSCLE SPASMS Kerri Perches, MD  Active   Multiple Vitamin (MULTIVITAMIN WITH MINERALS) TABS tablet [id number] Yes Take 1 tablet by mouth daily. [provider] Taking Active Self  olmesartan (BENICAR) 20 MG tablet [id number] Yes TAKE 1 TABLET(20 MG) BY MOUTH DAILY Kerri Perches, MD Taking Active   Omega-3 Fatty Acids (FISH OIL) 1200 MG CAPS [id number] Yes Take 1,200 mg by mouth daily.  [provider] Taking Active Self  ondansetron (ZOFRAN-ODT) 4 MG disintegrating tablet [id number] Yes DISSOLVE 1 TABLET(4 MG) ON THE TONGUE EVERY 8 HOURS AS NEEDED FOR NAUSEA OR VOMITING Kerri Perches, MD Taking Active   pantoprazole (PROTONIX) 40 MG tablet [id number] Yes Take 1 tablet (40 mg total) by mouth daily. Gelene Mink, NP Taking Active   predniSONE (DELTASONE) 10 MG tablet [id number] Yes Take 1 tablet (10 mg total) by mouth daily with breakfast. Gelene Mink, NP Taking Active   Probiotic Product (PROBIOTIC PO) [id number] Yes Take 1 tablet by mouth daily. [provider] Taking Active Self  Pyridoxine HCl (VITAMIN B6 PO) [id number] Yes Take  1 tablet by mouth daily. [provider] Taking Active Self  Ustekinumab [facility]) [id number] Yes Inject 90 mg into the skin as directed. Every 8 weeks [provider] Taking Active Self  venlafaxine XR (EFFEXOR-XR) 37.5 MG 24  hr capsule [id number] Yes TAKE 1 CAPSULE(37.5 MG) BY MOUTH DAILY WITH BREAKFAST Kerri Perches, MD Taking Active   vitamin C (ASCORBIC ACID) 500 MG tablet [id number] Yes Take 500 mg by mouth daily. [provider] Taking Active Self  Vitamin D, Ergocalciferol, (DRISDOL) 1.25 MG (50000 UNIT) CAPS capsule [id number] Yes TAKE ONE CAPSULE BY MOUTH ONCE A WEEK Doreatha Massed, MD Taking Active             Home Care and Equipment/Supplies: Were Home Health Services Ordered?: NA Any new equipment or medical supplies ordered?: Yes Name of Medical supply agency?: [facility] Were you able to get the equipment/medical supplies?: Yes Do you have any questions related to the use of the equipment/supplies?: No (problems with ostomy bag leakage)  Functional Questionnaire: Do you need assistance with bathing/showering or dressing?: Yes Do you need assistance with meal preparation?: Yes Do you need assistance with eating?: No Do you have difficulty maintaining continence: Yes (Problem with ostomy bag leakage) Do you need assistance with getting out of bed/getting out of a chair/moving?: No Do you have difficulty managing or taking your medications?: No  Follow up appointments reviewed: PCP Follow-up appointment confirmed?: NA Specialist Hospital Follow-up appointment confirmed?: Yes Date of Specialist follow-up appointment?: 04/12/23 Follow-Up Specialty Provider:: [id number] 10:00 DR Marcella Dubs surgery  0207 10:00 LABS  [id number] Durenda Hurt NP oncology  [id number] Dr Vance Gather Gastroenterology Do you need transportation to your follow-up appointment?: No Do you understand care options if your condition(s) worsen?: Yes-patient verbalized understanding  SDOH Interventions Today    Flowsheet Row Most Recent Value  SDOH Interventions   Food Insecurity Interventions Intervention Not Indicated  Housing Interventions Intervention Not Indicated  Transportation  Interventions Intervention Not Indicated, Patient Resources (Friends/Family)  Utilities Interventions Intervention Not Indicated      Interventions Today    Flowsheet Row Most Recent Value  Chronic Disease   Chronic disease during today's visit Other  [Crohn's disease]  General Interventions   General Interventions Discussed/Reviewed General Interventions Discussed, General Interventions Reviewed, Doctor Visits, Referral to Nurse, Communication with  [RN referred to Lonia Chimera Case Care Manager to follow up with patient]  Doctor Visits Discussed/Reviewed Doctor Visits Discussed, Doctor Visits Reviewed, Specialist  PCP/Specialist Visits Compliance with follow-up visit  Communication with PCP/Specialists, RN  [Patient will call Surgeon to arrange for [facility] nurse to come and work with her on Ostomy problems]  Education Interventions   Education Provided Provided Education  Provided Verbal Education On Other  [On obtaining assistance with ostomy]  Nutrition Interventions   Nutrition Discussed/Reviewed Nutrition Discussed  Pharmacy Interventions   Pharmacy Dicussed/Reviewed Pharmacy Topics Discussed        Goals Addressed             This Visit's Progress    TOC 30 day program       Current Barriers:  Knowledge Deficits related to plan of care for management of crohn's disease and ileostomy care   RNCM Clinical Goal(s):  Patient will work with the Care Management team over the next 30 days to address Transition of Care Barriers: Ostomy care take all medications exactly as prescribed and will call provider for medication related questions as evidenced by EMR attend all scheduled  medical appointments: Specialists as evidenced by EMR  through collaboration with RN Care manager, provider, and care team.   Interventions: Evaluation of current treatment plan related to  self management and patient's adherence to plan as established by provider  Transitions of Care:  New goal. Doctor  Visits  - discussed the importance of doctor visits  Patient Goals/Self-Care Activities: Participate in Transition of Care Program/Attend [facility] scheduled calls Notify RN Care Manager of TOC call rescheduling needs Take all medications as prescribed Attend all scheduled provider appointments Call pharmacy for medication refills 3-7 days in advance of running out of medications  Follow Up Plan:  Telephone follow up appointment with care management team member scheduled for:  Lonia Chimera RN Case Care Manager [phone] 2:00 The patient has been provided with contact information for the care management team and has been advised to call with any health related questions or concerns.  The patient will call surgeon as advised to request [facility] nurse to come and educate and assist with problems with  ostomy.           Gean Maidens BSN RN [facility] Health Care Management Coordinator [email or web address] Direct Dial: [phone]  Fax: [phone] Website: [email or web address]

## 2023-03-28 DIAGNOSIS — Z932 Ileostomy status: Secondary | ICD-10-CM | POA: Diagnosis not present

## 2023-03-29 ENCOUNTER — Ambulatory Visit: Payer: Self-pay | Admitting: Family Medicine

## 2023-03-29 NOTE — Telephone Encounter (Signed)
Chief Complaint: ostomy problem Symptoms: burning, skin irritation, pain Frequency: 3 days Pertinent Negatives: Patient denies fever, change in stool color/consistency/odor, change in stoma appearance  Disposition: [] ED /[] Urgent Care (no appt availability in office) / [x] Appointment(In office/virtual)/ []  [facility]/ [] Home Care/ [] Refused Recommended Disposition /[] [facility]/ []  Follow-up with PCP  Additional Notes: 1/9 had ostomy placed, experiencing pain near the ostomy site. States ostomy is leaking and won't stay in place. Pt is out of supplies to change the ostomy.  Pt states that the stoma is still red and beefy in color. Pt advised to attempt calling the clinic that has been assisting with her ostomy, she has already reached out to them earlier today but has not heard back, will attempt again. Sched with PCP.   Copied from CRM [id number]. Topic: Clinical - Red Word Triage >> Mar 29, 2023  8:24 AM Geroge Baseman wrote: Red Word that prompted transfer to Nurse Triage:Severe painclose to where ostomy is located Reason for Disposition  [1] MILD-MODERATE abdominal pain AND [2] constant AND [3] present > 2 hours  Answer Assessment - Initial Assessment Questions 1. TYPE: "What type of ostomy do you have?" (e.g., ileostomy, colostomy; temporary, long-term [permanent]).     Temp ileostomy 2. LOCATION: "Where is the ostomy located?"     R 3. DURATION: "How long have you had the ostomy?" (e.g., days, weeks, months, years).     Placed 1/9 4. MAIN CONCERN: "What is your main concern or symptom today?" (e.g., skin redness or irritation, bleeding, leaking, change in output or appearance of ostomy).     Burning near site and no supplies, keeps leaking, pt states also pain in abd near the ostomy site.  5. DAILY OSTOMY OUTPUT DIARY: "Do you keep a daily diary of the output from your ostomy"?      denies 6. OSTOMY OUTPUT: "How much stool output over a 24 hour period are you  having?" (e.g.,  ml; consistency of water, milkshake, pudding)     Does not keep record 7. OTHER SYMPTOMS: "Are you having any other symptoms?" (e.g., fever, vomiting, blood in stool, abdomen pain, dizziness)     denies  Answer Assessment - Initial Assessment Questions 1. LOCATION: "Where does it hurt?"      Pt states she is having pain in the area of the ostomy both inside the abd cav and the skin around.  2. RADIATION: "Does the pain shoot anywhere else?" (e.g., chest, back)     Stays in belly 3. ONSET: "When did the pain begin?" (e.g., minutes, hours or days ago)      3 days 4. SUDDEN: "Gradual or sudden onset?"     gradual 5. PATTERN "Does the pain come and go, or is it constant?"    - If it comes and goes: "How long does it last?" "Do you have pain now?"     (Note: Comes and goes means the pain is intermittent. It goes away completely between bouts.)    - If constant: "Is it getting better, staying the same, or getting worse?"      (Note: Constant means the pain never goes away completely; most serious pain is constant and gets worse.)      intermittent 6. SEVERITY: "How bad is the pain?"  (e.g., Scale 1-10; mild, moderate, or severe)    - MILD (1-3): Doesn't interfere with normal activities, abdomen soft and not tender to touch.     - MODERATE (4-7): Interferes with normal activities  or awakens from sleep, abdomen tender to touch.     - SEVERE (8-10): Excruciating pain, doubled over, unable to do any normal activities.       8 7. RECURRENT SYMPTOM: "Have you ever had this type of stomach pain before?" If Yes, ask: "When was the last time?" and "What happened that time?"      denies 8. CAUSE: "What do you think is causing the stomach pain?"     Ostomy 9. RELIEVING/AGGRAVATING FACTORS: "What makes it better or worse?" (e.g., antacids, bending or twisting motion, bowel movement)     Not laying completely down is helpful 10. OTHER SYMPTOMS: "Do you have any other symptoms?" (e.g.,  back pain, diarrhea, fever, urination pain, vomiting)       Denies fever, no change in stool look/smell.  Protocols used: Abdominal Pain - Female-A-AH, Ostomy Symptoms and Questions-A-AH

## 2023-04-03 ENCOUNTER — Telehealth: Payer: Self-pay

## 2023-04-03 NOTE — Transitions of Care (Post Inpatient/ED Visit) (Signed)
   04/03/2023  Name: [name] MRN: [id number] DOB: [birth year]  Today's TOC FU Call Status: Today's TOC FU Call Status:: Unsuccessful Call (1st Attempt) Unsuccessful Call (1st Attempt) Date: 04/03/23  Attempted to reach the patient regarding the most recent Inpatient/ED visit.  Follow Up Plan: Additional outreach attempts will be made to reach the patient to complete the Transitions of Care (Post Inpatient/ED visit) call.   Lonia Chimera, RN, BSN, CEN [employer]- Transition of Care Team.  [facility] [phone]

## 2023-04-03 NOTE — Transitions of Care (Post Inpatient/ED Visit) (Signed)
04/03/2023  Name: [name] MRN: [id number] DOB: 07-Apr-1970  Today's TOC FU Call Status: Today's TOC FU Call Status:: Successful TOC FU Call Completed TOC FU Call Complete Date: 04/03/23 Patient's Name and Date of Birth confirmed.  Transition Care Management Follow-up Telephone Call How have you been since you were released from the hospital?: Better (Feeling better. Drinking well.) Any questions or concerns?: No (bags were switched)  Items Reviewed: Did you receive and understand the discharge instructions provided?: Yes Medications obtained,verified, and reconciled?: Yes (Medications Reviewed) Any new allergies since your discharge?: No Dietary orders reviewed?: NA Do you have support at home?: Yes People in Home: child(ren), adult Name of Support/Comfort Primary Source: [name]  Medications Reviewed Today: Medications Reviewed Today     Reviewed by Earlie Server, RN (Registered Nurse) on 04/03/23 at 1500  Med List Status: <None>   Medication Order Taking? Sig Documenting Provider Last Dose Status Informant  budesonide (ENTOCORT EC) 3 MG 24 hr capsule [id number] No Take by mouth.  Patient not taking: Reported on 04/03/2023   [provider] Not Taking Active   calcium-vitamin D (OSCAL WITH D) 500-200 MG-UNIT tablet [id number] Yes Take 1 tablet by mouth daily. [provider] Taking Active Self           Med Note Tyrell Antonio   Sat Sep 17, 2022  4:41 PM)    dicyclomine (BENTYL) 10 MG capsule [id number] No Take 1 capsule (10 mg total) by mouth 4 (four) times daily -  before meals and at bedtime. For stomach cramping. Stop if constipation. Please use sparingly as can slow intestinal transit.  Patient not taking: Reported on 04/03/2023   Gelene Mink, NP Not Taking Active   hydrOXYzine (VISTARIL) 100 MG capsule [id number] Yes Take one capsule by mouth every night for sleep and anxiety Kerri Perches, MD Taking Active   magnesium oxide (MAG-OX) 400  (240 Mg) MG tablet [id number] No Take 400 mg by mouth daily.  Patient not taking: Reported on 04/03/2023   [provider] Not Taking Active Self    Discontinued 11/19/22 0057   methocarbamol (ROBAXIN) 500 MG tablet [id number] Yes TAKE 1 TABLET(500 MG) BY MOUTH EVERY 8 HOURS AS NEEDED FOR MUSCLE SPASMS Kerri Perches, MD Taking Active   Multiple Vitamin (MULTIVITAMIN WITH MINERALS) TABS tablet [id number] Yes Take 1 tablet by mouth daily. [provider] Taking Active Self  olmesartan (BENICAR) 20 MG tablet [id number] No TAKE 1 TABLET(20 MG) BY MOUTH DAILY  Patient not taking: Reported on 04/03/2023   Kerri Perches, MD Not Taking Active   Omega-3 Fatty Acids (FISH OIL) 1200 MG CAPS [id number] Yes Take 1,200 mg by mouth daily.  [provider] Taking Active Self  ondansetron (ZOFRAN-ODT) 4 MG disintegrating tablet [id number] Yes DISSOLVE 1 TABLET(4 MG) ON THE TONGUE EVERY 8 HOURS AS NEEDED FOR NAUSEA OR VOMITING Kerri Perches, MD Taking Active   pantoprazole (PROTONIX) 40 MG tablet [id number] Yes Take 1 tablet (40 mg total) by mouth daily. Gelene Mink, NP Taking Active   predniSONE (DELTASONE) 10 MG tablet [id number] No Take 1 tablet (10 mg total) by mouth daily with breakfast.  Patient not taking: Reported on 04/03/2023   Gelene Mink, NP Not Taking Active   Probiotic Product (PROBIOTIC PO) [id number] Yes Take 1 tablet by mouth daily. [provider] Taking Active Self  Pyridoxine HCl (VITAMIN B6 PO) [id number] Yes Take 1 tablet by mouth daily. [provider] Taking  Active Self  Ustekinumab [facility]) [id number] Yes Inject 90 mg into the skin as directed. Every 8 weeks [provider] Taking Active Self  venlafaxine XR (EFFEXOR-XR) 37.5 MG 24 hr capsule [id number] Yes TAKE 1 CAPSULE(37.5 MG) BY MOUTH DAILY WITH BREAKFAST Kerri Perches, MD Taking Active   vitamin C (ASCORBIC ACID) 500 MG tablet [id number] Yes Take 500 mg by mouth  daily. [provider] Taking Active Self  Vitamin D, Ergocalciferol, (DRISDOL) 1.25 MG (50000 UNIT) CAPS capsule [id number] Yes TAKE ONE CAPSULE BY MOUTH ONCE A WEEK Doreatha Massed, MD Taking Active             Home Care and Equipment/Supplies: Name of Medical supply agency?: [employer] ostomy supplies Were you able to get the equipment/medical supplies?: Yes Do you have any questions related to the use of the equipment/supplies?: Yes What questions do you have?: wants a home health nurse  Functional Questionnaire: Do you need assistance with bathing/showering or dressing?: No Do you need assistance with meal preparation?: No Do you need assistance with eating?: No Do you have difficulty maintaining continence: No Do you need assistance with getting out of bed/getting out of a chair/moving?: No Do you have difficulty managing or taking your medications?: No  Follow up appointments reviewed: PCP Follow-up appointment confirmed?: No MD Provider Line [phone] Given: No Specialist Hospital Follow-up appointment confirmed?: Yes Date of Specialist follow-up appointment?: 04/19/23 Follow-Up Specialty Provider:: Dr. Milbert Coulter Do you need transportation to your follow-up appointment?: No Do you understand care options if your condition(s) worsen?: Yes-patient verbalized understanding  SDOH Interventions Today    Flowsheet Row Most Recent Value  SDOH Interventions   Food Insecurity Interventions Intervention Not Indicated  Housing Interventions Intervention Not Indicated  Transportation Interventions Intervention Not Indicated  Utilities Interventions Intervention Not Indicated      Interventions Today    Flowsheet Row Most Recent Value  Chronic Disease   Chronic disease during today's visit Other  [revison of ostomy]  General Interventions   General Interventions Discussed/Reviewed General Interventions Discussed, General Interventions Reviewed, Doctor  Visits  Doctor Visits Discussed/Reviewed Doctor Visits Discussed  PCP/Specialist Visits Compliance with follow-up visit  Communication with PCP/Specialists, RN  Exercise Interventions   Exercise Discussed/Reviewed Exercise Discussed  Education Interventions   Education Provided Provided Education  [Patient wants home health nurse to assist with ostomy concerns. Patient to call surgeons office at [facility] to asked for this order.]  Provided Verbal Education On Nutrition  [Reviewed importance of hydration.]  Nutrition Interventions   Nutrition Discussed/Reviewed Nutrition Discussed, Fluid intake  Pharmacy Interventions   Pharmacy Dicussed/Reviewed Medications and their functions       TOC Interventions Today    Flowsheet Row Most Recent Value  TOC Interventions   TOC Interventions Discussed/Reviewed TOC Interventions Discussed, TOC Interventions Reviewed, Post discharge activity limitations per provider, Post op wound/incision care, S/S of infection      Reviewed discharge instructions with patient. She reports she is doing well and feels better with the wafer system that she now has for her stoma.  Patient is interested in home health nursing to assist with stoma care. Encouraged patient to call surgeon to inquire about order. Patient reports that she will call PCP and make an appointment.  Offered 30 day TOC program and patient declined at this time. Provided my contact information for patient to call back if needed.  Lonia Chimera, RN, BSN, CEN [employer]- Transition of Care Team.  [facility] [phone]

## 2023-04-04 ENCOUNTER — Other Ambulatory Visit: Payer: Self-pay

## 2023-04-05 ENCOUNTER — Ambulatory Visit: Payer: Self-pay | Admitting: Family Medicine

## 2023-04-05 ENCOUNTER — Other Ambulatory Visit: Payer: Self-pay

## 2023-04-05 ENCOUNTER — Encounter (HOSPITAL_COMMUNITY): Payer: Self-pay

## 2023-04-05 ENCOUNTER — Emergency Department (HOSPITAL_COMMUNITY)
Admission: EM | Admit: 2023-04-05 | Discharge: 2023-04-05 | Disposition: A | Discharge status: Home / Self Care | Payer: Medicare HMO | Attending: Emergency Medicine | Admitting: Emergency Medicine

## 2023-04-05 DIAGNOSIS — Z91010 Allergy to peanuts: Secondary | ICD-10-CM | POA: Diagnosis not present

## 2023-04-05 DIAGNOSIS — R238 Other skin changes: Secondary | ICD-10-CM | POA: Diagnosis not present

## 2023-04-05 DIAGNOSIS — Z432 Encounter for attention to ileostomy: Secondary | ICD-10-CM | POA: Diagnosis not present

## 2023-04-05 DIAGNOSIS — L249 Irritant contact dermatitis, unspecified cause: Secondary | ICD-10-CM | POA: Diagnosis not present

## 2023-04-05 NOTE — ED Provider Notes (Signed)
[place] EMERGENCY DEPARTMENT AT [facility] Provider Note   CSN: [id number] Arrival date & time: 04/05/23  1159     History  Chief Complaint  Patient presents with   ileostomy    ANONA GIOVANNINI is a 53 y.o. female.  Pt s/p ileostomy placement for complications related to Crohns disease 03/16/2023, presents with problems changing her ostomy. Barrieradhesive piece not tightly secured to skin and area around ostomy with some skin excoriation and irritation. Indicates has adequate ostomy supplies. Indicates home health nurse had contacted to establish home visit for same while patient waiting in ED. No new or worsening abd pain. Is having her normal ostomy output. No nausea/vomiting. No fever or chills.   The history is provided by the patient and medical records.       Home Medications Prior to Admission medications   Medication Sig Start Date End Date Taking? Authorizing Provider  budesonide (ENTOCORT EC) 3 MG 24 hr capsule Take by mouth. Patient not taking: Reported on 04/03/2023 02/17/23 04/18/23  [provider]  calcium-vitamin D (OSCAL WITH D) 500-200 MG-UNIT tablet Take 1 tablet by mouth daily.    [provider]  dicyclomine (BENTYL) 10 MG capsule Take 1 capsule (10 mg total) by mouth 4 (four) times daily -  before meals and at bedtime. For stomach cramping. Stop if constipation. Please use sparingly as can slow intestinal transit. Patient not taking: Reported on 04/03/2023 12/26/22   Gelene Mink, NP  hydrOXYzine (VISTARIL) 100 MG capsule Take one capsule by mouth every night for sleep and anxiety 02/24/23   Kerri Perches, MD  magnesium oxide (MAG-OX) 400 (240 Mg) MG tablet Take 400 mg by mouth daily. Patient not taking: Reported on 04/03/2023    [provider]  methocarbamol (ROBAXIN) 500 MG tablet TAKE 1 TABLET(500 MG) BY MOUTH EVERY 8 HOURS AS NEEDED FOR MUSCLE SPASMS 03/06/23   Kerri Perches, MD  Multiple Vitamin  (MULTIVITAMIN WITH MINERALS) TABS tablet Take 1 tablet by mouth daily.    [provider]  olmesartan (BENICAR) 20 MG tablet TAKE 1 TABLET(20 MG) BY MOUTH DAILY Patient not taking: Reported on 04/03/2023 03/20/23   Kerri Perches, MD  Omega-3 Fatty Acids (FISH OIL) 1200 MG CAPS Take 1,200 mg by mouth daily.     [provider]  ondansetron (ZOFRAN-ODT) 4 MG disintegrating tablet DISSOLVE 1 TABLET(4 MG) ON THE TONGUE EVERY 8 HOURS AS NEEDED FOR NAUSEA OR VOMITING 02/07/23   Kerri Perches, MD  pantoprazole (PROTONIX) 40 MG tablet Take 1 tablet (40 mg total) by mouth daily. 12/12/22   Gelene Mink, NP  predniSONE (DELTASONE) 10 MG tablet Take 1 tablet (10 mg total) by mouth daily with breakfast. Patient not taking: Reported on 04/03/2023 12/12/22   Gelene Mink, NP  Probiotic Product (PROBIOTIC PO) Take 1 tablet by mouth daily.    [provider]  Pyridoxine HCl (VITAMIN B6 PO) Take 1 tablet by mouth daily.    [provider]  Ustekinumab (STELARA [place]) Inject 90 mg into the skin as directed. Every 8 weeks    [provider]  venlafaxine XR (EFFEXOR-XR) 37.5 MG 24 hr capsule TAKE 1 CAPSULE(37.5 MG) BY MOUTH DAILY WITH BREAKFAST 02/16/23   Kerri Perches, MD  vitamin C (ASCORBIC ACID) 500 MG tablet Take 500 mg by mouth daily.    [provider]  Vitamin D, Ergocalciferol, (DRISDOL) 1.25 MG (50000 UNIT) CAPS capsule TAKE ONE CAPSULE BY MOUTH  ONCE A WEEK 01/16/23   Doreatha Massed, MD  methocarbamol (ROBAXIN) 500 MG tablet Take 1 tablet (500 mg total) by mouth 3 (three) times daily. 11/18/22   Kerri Perches, MD      Allergies    Ferumoxytol, Other, Peanut-containing drug products, Shellfish allergy, and Oxycodone-acetaminophen    Review of Systems   Review of Systems  Constitutional:  Negative for chills and fever.  Respiratory:  Negative for shortness of breath.   Cardiovascular:  Negative for chest pain.  Gastrointestinal:   Negative for abdominal pain and vomiting.  Genitourinary:  Negative for dysuria.  Skin:        Skin irritation about ostomy    Physical Exam Updated Vital Signs BP 128/74 (BP Location: Right Arm)   Pulse (!) 107   Temp 98.8 F (37.1 C) (Oral)   Resp 18   Ht 1.702 m (5\' 7" )   Wt 90.8 kg   SpO2 98%   BMI 31.35 kg/m  Physical Exam Vitals and nursing note reviewed.  Constitutional:      Appearance: Normal appearance. She is well-developed.  HENT:     Head: Atraumatic.     Mouth/Throat:     Mouth: Mucous membranes are moist.  Eyes:     General: No scleral icterus.    Conjunctiva/sclera: Conjunctivae normal.  Neck:     Trachea: No tracheal deviation.  Cardiovascular:     Rate and Rhythm: Normal rate.     Pulses: Normal pulses.  Pulmonary:     Effort: Pulmonary effort is normal. No respiratory distress.  Abdominal:     General: Bowel sounds are normal. There is no distension.     Palpations: Abdomen is soft.     Tenderness: There is no abdominal tenderness. There is no guarding.     Comments: Ileostomy is pink, patent, functioning, liquid stool in bag and about ostomy apparatus/on skin. Barrier/adhesive part of ostomy is not affixed to skin. Mild skin excoriation about ostomy and lower abdomen. No cellulitis. No crepitus. No significant sts.   Genitourinary:    Comments: No cva tenderness.  Musculoskeletal:        General: No swelling.     Cervical back: Neck supple. No muscular tenderness.  Skin:    General: Skin is warm and dry.     Findings: No rash.  Neurological:     Mental Status: She is alert.     Comments: Alert, speech normal.   Psychiatric:     Comments: Pt tearful/upset about managing ostomy - reassurance provided (staff to assist with cleaning and change of ostomy bag/apparatus).      ED Results / Procedures / Treatments   Labs (all labs ordered are listed, but only abnormal results are displayed) Labs Reviewed - No data to  display  EKG None  Radiology No results found.  Procedures Procedures    Medications Ordered in ED Medications - No data to display  ED Course/ Medical Decision Making/ A&P                                 Medical Decision Making Problems Addressed: Ileostomy care [facility]): acute illness or injury Skin irritation: acute illness or injury  Amount and/or Complexity of Data Reviewed External Data Reviewed: notes.   Reviewed nursing notes and prior charts for additional history.  It appears pt with recent admission at [facility] for similar issues related to managing ostomy/changing bag.  Rn staff to clean and dry skin well and apply new ostomy/ostomy bag.   Pt reports home health services already in place and have recently reached out to her.   Pt appears stable for d/c.   Pt to f/u with her home health provider tomorrow AM to ensure prompt f/u and recheck with them.  Return precautions provided.         Final Clinical Impression(s) / ED Diagnoses Final diagnoses:  None    Rx / DC Orders ED Discharge Orders     None         Cathren Laine, MD 04/05/23 [phone]

## 2023-04-05 NOTE — ED Triage Notes (Signed)
Pt arrived POV c/o leaking ileostomy bag that has been on-going for the past few days. Pt reports calling her PCP and not being able to be seen by them. Pt has her own supplies present in Triage. Pt reports she is having difficulty applying her ileostomy bags and keeping them adhered to her skin. Pt reports she is beginning to have skin breakdown around stoma. Stoma presents red and inflamed.

## 2023-04-05 NOTE — Telephone Encounter (Signed)
Copied from CRM [id number]. Topic: Clinical - Red Word Triage >> Apr 05, 2023 10:39 AM Dimitri Ped wrote: [facility] that prompted transfer to Nurse Triage: patient says ostomy bag is linking real bad  And skin is irritated    Chief Complaint: skin breakdown around ostomy Symptoms: spreading redness, skin is "broken and bleeding" Frequency: continual Pertinent Negatives: Patient denies bleeding from stoma to her knowledge Disposition: [] 911 / [] ED /[x] Urgent Care (no appt availability in office) / [] Appointment(In office/virtual)/ []  [facility]/ [] Home Care/ [] Refused Recommended Disposition /[] [place] [facility]/ []  Follow-up with PCP Additional Notes: Pt reporting that she had an ileostomy on 03/16/23 to "right side right near stomach," reporting that "skin is bleeding and broken," confirms spreading redness to skin around stoma, pt reporting "no output" right now because "scared to do any of that" with the skin issue going on, has not eaten to avoid it. Pt confirms she is still drinking water okay. Pt reporting that she tried to change the ostomy bad herself several times but "didn't do well" or "not right" and still leaking. Pt reporting "white foam coming from the stoma now but "not seeing bleeding" from the stoma. Pt reporting she is not sure if the skin around stoma has stopped bleeding "don't want to move" the ostomy bad, "something I'm not doing right." Pt crying on phone, "very agitated" because "this is new to me" and "I'm freaking out." Nurse reassured pt. Pt reporting "just gonna go to the ED" because "don't want to sit like this," stating no improvement "just talking like this." Pt reporting home health nurses are not available today. Nurse agreed that pt should be seen right away for her overall comfort, offered to schedule appt with another office (no availability at PCP office) or nearby UC. Pt listened to options, no longer crying, pt stating that other offices too far from  her, pt declines offer to look for UC appt, "gonna go to the ED." Reassured pt, advised her to call her GI doc, who is in [place] for further recommendations and see if they could recommend another GI doc closer to her for more urgent matters like this. Advised pt to call if need anything. Encouraged pt. Pt verbalized understanding and appreciation. Pt heading to ED.  Reason for Disposition  [1] Caller has URGENT question AND [2] triager unable to answer question  Answer Assessment - Initial Assessment Questions 1. TYPE: "What type of ostomy do you have?" (e.g., ileostomy, colostomy; temporary, long-term [permanent]).     ileostomy 2. LOCATION: "Where is the ostomy located?"     Right side right near stomach 3. DURATION: "How long have you had the ostomy?" (e.g., days, weeks, months, years).     Since 1/9 4. MAIN CONCERN: "What is your main concern or symptom today?" (e.g., skin redness or irritation, bleeding, leaking, change in output or appearance of ostomy).     Skin bleeding and broken, spreading redness, no output because scared to eat 7. OTHER SYMPTOMS: "Are you having any other symptoms?" (e.g., fever, vomiting, blood in stool, abdomen pain, dizziness)     denies  Protocols used: Ostomy Symptoms and Questions-A-AH

## 2023-04-05 NOTE — ED Notes (Signed)
Ostomy care provided.  Pt teaching completed.  New bag applied.

## 2023-04-05 NOTE — Discharge Instructions (Signed)
It was our pleasure to provide your ER care today - we hope that you feel better.  Keep your skin very clean and dry.   Change/empty bag as  need.   Follow up with your home health nurse/service in AM tomorrow, to ensure prompt follow up with them.  Return to ER if worse, new symptoms, fevers, spreading redness, new/severe pain, vomiting, or other concern.

## 2023-04-06 DIAGNOSIS — Z7901 Long term (current) use of anticoagulants: Secondary | ICD-10-CM | POA: Diagnosis not present

## 2023-04-06 DIAGNOSIS — K9413 Enterostomy malfunction: Secondary | ICD-10-CM | POA: Diagnosis not present

## 2023-04-06 DIAGNOSIS — E860 Dehydration: Secondary | ICD-10-CM | POA: Diagnosis not present

## 2023-04-06 DIAGNOSIS — I10 Essential (primary) hypertension: Secondary | ICD-10-CM | POA: Diagnosis not present

## 2023-04-06 DIAGNOSIS — K50019 Crohn's disease of small intestine with unspecified complications: Secondary | ICD-10-CM | POA: Diagnosis not present

## 2023-04-06 DIAGNOSIS — F32A Depression, unspecified: Secondary | ICD-10-CM | POA: Diagnosis not present

## 2023-04-10 DIAGNOSIS — F32A Depression, unspecified: Secondary | ICD-10-CM | POA: Diagnosis not present

## 2023-04-10 DIAGNOSIS — Z7901 Long term (current) use of anticoagulants: Secondary | ICD-10-CM | POA: Diagnosis not present

## 2023-04-10 DIAGNOSIS — I10 Essential (primary) hypertension: Secondary | ICD-10-CM | POA: Diagnosis not present

## 2023-04-10 DIAGNOSIS — K50019 Crohn's disease of small intestine with unspecified complications: Secondary | ICD-10-CM | POA: Diagnosis not present

## 2023-04-10 DIAGNOSIS — E860 Dehydration: Secondary | ICD-10-CM | POA: Diagnosis not present

## 2023-04-10 DIAGNOSIS — K9413 Enterostomy malfunction: Secondary | ICD-10-CM | POA: Diagnosis not present

## 2023-04-12 DIAGNOSIS — I10 Essential (primary) hypertension: Secondary | ICD-10-CM | POA: Diagnosis not present

## 2023-04-12 DIAGNOSIS — Z7901 Long term (current) use of anticoagulants: Secondary | ICD-10-CM | POA: Diagnosis not present

## 2023-04-12 DIAGNOSIS — F32A Depression, unspecified: Secondary | ICD-10-CM | POA: Diagnosis not present

## 2023-04-12 DIAGNOSIS — K50019 Crohn's disease of small intestine with unspecified complications: Secondary | ICD-10-CM | POA: Diagnosis not present

## 2023-04-12 DIAGNOSIS — K9413 Enterostomy malfunction: Secondary | ICD-10-CM | POA: Diagnosis not present

## 2023-04-12 DIAGNOSIS — E860 Dehydration: Secondary | ICD-10-CM | POA: Diagnosis not present

## 2023-04-14 ENCOUNTER — Ambulatory Visit: Payer: Medicare HMO | Admitting: Family Medicine

## 2023-04-14 ENCOUNTER — Inpatient Hospital Stay: Payer: Medicare HMO | Attending: Hematology

## 2023-04-14 VITALS — BP 114/76 | HR 96 | Ht 67.5 in | Wt 199.4 lb

## 2023-04-14 DIAGNOSIS — K5090 Crohn's disease, unspecified, without complications: Secondary | ICD-10-CM | POA: Insufficient documentation

## 2023-04-14 DIAGNOSIS — M51362 Other intervertebral disc degeneration, lumbar region with discogenic back pain and lower extremity pain: Secondary | ICD-10-CM

## 2023-04-14 DIAGNOSIS — E559 Vitamin D deficiency, unspecified: Secondary | ICD-10-CM | POA: Insufficient documentation

## 2023-04-14 DIAGNOSIS — R197 Diarrhea, unspecified: Secondary | ICD-10-CM | POA: Insufficient documentation

## 2023-04-14 DIAGNOSIS — F32A Depression, unspecified: Secondary | ICD-10-CM | POA: Diagnosis not present

## 2023-04-14 DIAGNOSIS — D75839 Thrombocytosis, unspecified: Secondary | ICD-10-CM

## 2023-04-14 DIAGNOSIS — D75838 Other thrombocytosis: Secondary | ICD-10-CM | POA: Insufficient documentation

## 2023-04-14 DIAGNOSIS — M542 Cervicalgia: Secondary | ICD-10-CM

## 2023-04-14 DIAGNOSIS — Z79899 Other long term (current) drug therapy: Secondary | ICD-10-CM | POA: Insufficient documentation

## 2023-04-14 DIAGNOSIS — D509 Iron deficiency anemia, unspecified: Secondary | ICD-10-CM | POA: Diagnosis present

## 2023-04-14 DIAGNOSIS — E860 Dehydration: Secondary | ICD-10-CM | POA: Diagnosis not present

## 2023-04-14 DIAGNOSIS — M5090 Cervical disc disorder, unspecified, unspecified cervical region: Secondary | ICD-10-CM

## 2023-04-14 DIAGNOSIS — R5383 Other fatigue: Secondary | ICD-10-CM | POA: Diagnosis not present

## 2023-04-14 DIAGNOSIS — I10 Essential (primary) hypertension: Secondary | ICD-10-CM | POA: Diagnosis not present

## 2023-04-14 DIAGNOSIS — K50019 Crohn's disease of small intestine with unspecified complications: Secondary | ICD-10-CM | POA: Diagnosis not present

## 2023-04-14 DIAGNOSIS — D508 Other iron deficiency anemias: Secondary | ICD-10-CM

## 2023-04-14 DIAGNOSIS — K9413 Enterostomy malfunction: Secondary | ICD-10-CM | POA: Diagnosis not present

## 2023-04-14 DIAGNOSIS — Z814 Family history of other substance abuse and dependence: Secondary | ICD-10-CM | POA: Diagnosis not present

## 2023-04-14 DIAGNOSIS — Z836 Family history of other diseases of the respiratory system: Secondary | ICD-10-CM | POA: Diagnosis not present

## 2023-04-14 DIAGNOSIS — Z818 Family history of other mental and behavioral disorders: Secondary | ICD-10-CM | POA: Diagnosis not present

## 2023-04-14 DIAGNOSIS — D72829 Elevated white blood cell count, unspecified: Secondary | ICD-10-CM | POA: Insufficient documentation

## 2023-04-14 DIAGNOSIS — Z87891 Personal history of nicotine dependence: Secondary | ICD-10-CM | POA: Insufficient documentation

## 2023-04-14 DIAGNOSIS — Z7901 Long term (current) use of anticoagulants: Secondary | ICD-10-CM | POA: Diagnosis not present

## 2023-04-14 LAB — FERRITIN: Ferritin: 32 ng/mL (ref 11–307)

## 2023-04-14 LAB — IRON AND TIBC
Iron: 116 ug/dL (ref 28–170)
Saturation Ratios: 25 % (ref 10.4–31.8)
TIBC: 456 ug/dL — ABNORMAL HIGH (ref 250–450)
UIBC: 340 ug/dL

## 2023-04-14 LAB — CBC WITH DIFFERENTIAL/PLATELET
Abs Immature Granulocytes: 0.02 10*3/uL (ref 0.00–0.07)
Basophils Absolute: 0.1 10*3/uL (ref 0.0–0.1)
Basophils Relative: 1 %
Eosinophils Absolute: 1.1 10*3/uL — ABNORMAL HIGH (ref 0.0–0.5)
Eosinophils Relative: 12 %
HCT: 35.9 % — ABNORMAL LOW (ref 36.0–46.0)
Hemoglobin: 11.4 g/dL — ABNORMAL LOW (ref 12.0–15.0)
Immature Granulocytes: 0 %
Lymphocytes Relative: 46 %
Lymphs Abs: 4.6 10*3/uL — ABNORMAL HIGH (ref 0.7–4.0)
MCH: 27.3 pg (ref 26.0–34.0)
MCHC: 31.8 g/dL (ref 30.0–36.0)
MCV: 85.9 fL (ref 80.0–100.0)
Monocytes Absolute: 0.7 10*3/uL (ref 0.1–1.0)
Monocytes Relative: 7 %
Neutro Abs: 3.3 10*3/uL (ref 1.7–7.7)
Neutrophils Relative %: 34 %
Platelets: 401 10*3/uL — ABNORMAL HIGH (ref 150–400)
RBC: 4.18 MIL/uL (ref 3.87–5.11)
RDW: 15.4 % (ref 11.5–15.5)
WBC: 9.8 10*3/uL (ref 4.0–10.5)
nRBC: 0 % (ref 0.0–0.2)

## 2023-04-14 LAB — FOLATE: Folate: 5.5 ng/mL — ABNORMAL LOW (ref >5.9)

## 2023-04-14 LAB — VITAMIN B12: Vitamin B-12: 532 pg/mL (ref 180–914)

## 2023-04-14 LAB — VITAMIN D 25 HYDROXY (VIT D DEFICIENCY, FRACTURES): Vit D, 25-Hydroxy: 33.35 ng/mL (ref 30–100)

## 2023-04-14 MED ORDER — OXYCODONE HCL 10 MG PO TABS: ORAL_TABLET | ORAL | 0 refills | Status: DC

## 2023-04-14 NOTE — Patient Instructions (Signed)
 F/u as before , call if you need me sooner  I will attempt to get MRI of neck and lower back, need X rays today of your neck  Pain medication prescribed is short term only and take NO MORE than 2 tablets in a 24 hour period although script states up to 3 /day  Thanks for choosing [facility], we consider it a privelige to serve you.

## 2023-04-16 ENCOUNTER — Encounter: Payer: Self-pay | Admitting: Family Medicine

## 2023-04-16 DIAGNOSIS — K9413 Enterostomy malfunction: Secondary | ICD-10-CM | POA: Insufficient documentation

## 2023-04-16 DIAGNOSIS — M51362 Other intervertebral disc degeneration, lumbar region with discogenic back pain and lower extremity pain: Secondary | ICD-10-CM | POA: Insufficient documentation

## 2023-04-16 HISTORY — DX: Enterostomy malfunction: K94.13

## 2023-04-16 NOTE — Assessment & Plan Note (Signed)
 X ray in 08/2022 reports severe arthritis and disc disease Presents again with c/o uncontrolled back and RLE pain with weakness , needs MRI Short term oxycodone  prescribed

## 2023-04-16 NOTE — Assessment & Plan Note (Signed)
 Hospital course reviewed, pt doing well post discharge, anxious to have surgery reversed. Bag does leak at times and currently has tape applied

## 2023-04-16 NOTE — Assessment & Plan Note (Addendum)
 Severe uncontrolled neck and upper arm pain, MRI of 2012 reports disc disease, has had C spine surgery in the past States anti inflammatory of no benefit Limited oxycodone   prescribed short term

## 2023-04-16 NOTE — Progress Notes (Signed)
   [name]     MRN: [id number]      DOB: 1970-07-02  Chief Complaint  Patient presents with   Hospitalization Follow-up    Follow up after hospital stay. Hard to get up and down, both sides of her shoulders burn X 4 days    HPI [name] is here for follow up of recent hospitalization for ileostomy dysfunction at [facility]. Reports normal function currently, noi significant abdominal pain, bloating or distension C/o recurrent disabling low back pain radiating down  right buttock and leg with weakness, also new c/o severe bilateral upper extremity and neck  pain which disturbs sleep and ability to function with weakness,  Has remote h/o c spine surgery and Scan following the surgery  in 05/2008 documents moderately diffuse bulging disc at C4 -5   ROS Denies recent fever or chills. Denies sinus pressure, nasal congestion, ear pain or sore throat. Denies chest congestion, productive cough or wheezing. Denies chest pains, palpitations and leg swelling Denies abdominal pain, nausea, vomiting,diarrhea or constipation.   Denies dysuria, frequency, hesitancy or incontinence.  Chronic  depression, anxiety or insomnia. Denies skin break down or rash.   PE  BP 114/76   Pulse 96   Ht 5' 7.5 (1.715 m)   Wt 199 lb 6.4 oz (90.4 kg)   SpO2 96%   BMI 30.77 kg/m   Patient alert and oriented and in no cardiopulmonary distress.Pt in apin with limitation in movement  HEENT: No facial asymmetry, EOMI,     Neck decreased ROM  .  Chest: Clear to auscultation bilaterally.  CVS: S1, S2 no murmurs, no S3.Regular rate.  ABD: Soft non tender. Functional ostomy  Ext: No edema  MS: decreased ROM lumbar spine,  adeqaute in shoulders, hips and knees.  Skin: Intact, no ulcerations or rash noted.  Psych: Good eye contact, normal affect. Memory intact not anxious or depressed appearing.  CNS: CN 2-12 intact, grade 4 power in RLE and in upper extremities,  Assessment & Plan  Cervical  neck pain with evidence of disc disease Severe uncontrolled neck and upper arm pain, MRI of 2012 reports disc disease, has had C spine surgery in the past States anti inflammatory of no benefit Limited oxycodone   prescribed short term  Degeneration of intervertebral disc of lumbar region with discogenic back pain and lower extremity pain X ray in 08/2022 reports severe arthritis and disc disease Presents again with c/o uncontrolled back and RLE pain with weakness , needs MRI Short term oxycodone  prescribed  Ileostomy dysfunction [facility]) Hospital course reviewed, pt doing well post discharge, anxious to have surgery reversed. Bag does leak at times and currently has tape applied  Essential hypertension Controlled,and currently on no medication, will follow

## 2023-04-16 NOTE — Assessment & Plan Note (Addendum)
 Controlled,and currently on no medication, will follow

## 2023-04-17 DIAGNOSIS — K50019 Crohn's disease of small intestine with unspecified complications: Secondary | ICD-10-CM | POA: Diagnosis not present

## 2023-04-17 DIAGNOSIS — E860 Dehydration: Secondary | ICD-10-CM | POA: Diagnosis not present

## 2023-04-17 DIAGNOSIS — F32A Depression, unspecified: Secondary | ICD-10-CM | POA: Diagnosis not present

## 2023-04-17 DIAGNOSIS — I10 Essential (primary) hypertension: Secondary | ICD-10-CM | POA: Diagnosis not present

## 2023-04-17 DIAGNOSIS — K9413 Enterostomy malfunction: Secondary | ICD-10-CM | POA: Diagnosis not present

## 2023-04-17 DIAGNOSIS — Z7901 Long term (current) use of anticoagulants: Secondary | ICD-10-CM | POA: Diagnosis not present

## 2023-04-17 LAB — METHYLMALONIC ACID, SERUM: Methylmalonic Acid, Quantitative: 155 nmol/L (ref 0–378)

## 2023-04-19 DIAGNOSIS — K50012 Crohn's disease of small intestine with intestinal obstruction: Secondary | ICD-10-CM | POA: Diagnosis not present

## 2023-04-19 DIAGNOSIS — Z09 Encounter for follow-up examination after completed treatment for conditions other than malignant neoplasm: Secondary | ICD-10-CM | POA: Diagnosis not present

## 2023-04-20 DIAGNOSIS — K9413 Enterostomy malfunction: Secondary | ICD-10-CM | POA: Diagnosis not present

## 2023-04-20 DIAGNOSIS — I10 Essential (primary) hypertension: Secondary | ICD-10-CM | POA: Diagnosis not present

## 2023-04-20 DIAGNOSIS — Z7901 Long term (current) use of anticoagulants: Secondary | ICD-10-CM | POA: Diagnosis not present

## 2023-04-20 DIAGNOSIS — K50019 Crohn's disease of small intestine with unspecified complications: Secondary | ICD-10-CM | POA: Diagnosis not present

## 2023-04-20 DIAGNOSIS — E860 Dehydration: Secondary | ICD-10-CM | POA: Diagnosis not present

## 2023-04-20 DIAGNOSIS — F32A Depression, unspecified: Secondary | ICD-10-CM | POA: Diagnosis not present

## 2023-04-21 ENCOUNTER — Inpatient Hospital Stay (HOSPITAL_BASED_OUTPATIENT_CLINIC_OR_DEPARTMENT_OTHER): Payer: Medicare HMO | Admitting: Oncology

## 2023-04-21 ENCOUNTER — Ambulatory Visit (HOSPITAL_COMMUNITY)
Admission: RE | Admit: 2023-04-21 | Discharge: 2023-04-21 | Disposition: A | Discharge status: Home / Self Care | Payer: Medicare HMO | Source: Ambulatory Visit | Attending: Family Medicine | Admitting: Family Medicine

## 2023-04-21 VITALS — BP 115/79 | HR 100 | Temp 98.4°F | Resp 16 | Wt 195.3 lb

## 2023-04-21 DIAGNOSIS — M47812 Spondylosis without myelopathy or radiculopathy, cervical region: Secondary | ICD-10-CM | POA: Diagnosis not present

## 2023-04-21 DIAGNOSIS — Z981 Arthrodesis status: Secondary | ICD-10-CM | POA: Diagnosis not present

## 2023-04-21 DIAGNOSIS — D75839 Thrombocytosis, unspecified: Secondary | ICD-10-CM | POA: Diagnosis not present

## 2023-04-21 DIAGNOSIS — D508 Other iron deficiency anemias: Secondary | ICD-10-CM

## 2023-04-21 DIAGNOSIS — E559 Vitamin D deficiency, unspecified: Secondary | ICD-10-CM

## 2023-04-21 DIAGNOSIS — M5090 Cervical disc disorder, unspecified, unspecified cervical region: Secondary | ICD-10-CM | POA: Insufficient documentation

## 2023-04-21 DIAGNOSIS — D509 Iron deficiency anemia, unspecified: Secondary | ICD-10-CM | POA: Diagnosis not present

## 2023-04-21 DIAGNOSIS — M542 Cervicalgia: Secondary | ICD-10-CM | POA: Diagnosis not present

## 2023-04-21 NOTE — Progress Notes (Signed)
VIRTUAL VISIT via TELEPHONE NOTE [name]   REASON FOR VISIT: Iron deficiency anemia and reactive leukocytosis/thrombocytosis  CURRENT THERAPY: Oral iron supplementation with intermittent IV iron as needed  INTERVAL HISTORY:  [name] is contacted today for follow-up of her iron deficiency anemia and reactive leukocytosis/thrombocytosis.  She was last evaluated via telemedicine visit by Rojelio Brenner PA-C on 04/15/2022.  She was admitted in July for Crohn's disease exacerbation causing small bowel obstruction.  She was sent home on steroids.  She was started on Stelara.  She was evaluated on 12/01/2022 for abdominal pain in [facility].  Patient needed to be transferred to tertiary hospital but unfortunately there are no beds available.  Patient declined going to [facility].  Patient remained at [facility] for several days with NG tube and received fluids.  Eventually she was discharged home without surgical intervention.  Patient had ileostomy creation on 03/16/2023 with good tolerance at [name].  Reports doing well post ileostomy except for mild issues finding supplies.  Denies any additional abdominal pain since.  Reports very liquid stools in her ostomy but denies bleeding.  Reports she is ready for her ostomy to be reversed although she knows she must wait 3 months from when it was placed.   Reports low energy levels about 20 to 25%.  Appetite is about 50%.  Has joint and lower back pain which is a chronic problem for her.  Has trouble sleeping due to this. She admits to occasional headaches.  She denies any pica, restless legs, chest pain, dyspnea on exertion, lightheadedness, or syncope.  She continues to take her daily oral iron supplementation and is tolerating it well. No B symptoms such as fever, chills, night sweats, unintentional weight loss.   Reports she has follow-up at [facility] for discussion of ostomy reversal in about 6 weeks.   REVIEW OF SYSTEMS:   Review of  Systems  Constitutional:  Positive for malaise/fatigue. Negative for weight loss.  Gastrointestinal:  Positive for diarrhea. Negative for blood in stool, nausea and vomiting.       Ostomy in place  Musculoskeletal:  Positive for back pain and joint pain.  Neurological:  Positive for sensory change.  Psychiatric/Behavioral:  The patient has insomnia.      PHYSICAL EXAM  Physical Exam Constitutional:      Appearance: Normal appearance.  Cardiovascular:     Rate and Rhythm: Normal rate and regular rhythm.  Pulmonary:     Effort: Pulmonary effort is normal.     Breath sounds: Normal breath sounds.  Abdominal:     General: Bowel sounds are normal.     Palpations: Abdomen is soft.  Musculoskeletal:        General: No swelling. Normal range of motion.  Neurological:     Mental Status: She is alert and oriented to person, place, and time. Mental status is at baseline.      ASSESSMENT  1.  Iron deficiency anemia: - Prior history of menorrhagia, but patient is now perimenopausal, with LMP in fall 2023 - She has underlying Crohn's disease, but denies any recent GI blood loss - Colonoscopy (05/10/2021) Proximal colon with suspected surgical changes, small nodule and adjacent small ulcer biopsied, possibly contributing to IDA - pathology showed inflammation, no malignancy. - Last Venofer was on 09/11/2019. - She has been taking oral iron supplement (Niferex) at home with improvement in her iron levels   2.  Vitamin D deficiency: - Vitamin D level is 39.0 (04/08/2022) -  She is taking Vitamin D 50,000 units weekly    3.  Leukocytosis / thrombocytosis: - She has had intermittent leukocytosis and thrombocytosis since at least 2008, associated with steroid use and previous infections.   PLAN: 1. Other iron deficiency anemia (Primary) - Most recent labs (04/14/23): Hgb 11.4/MCV 85.9, ferritin 32, iron saturation 25%.  Normal B12/methylmalonic acid, folate when checked in January 2023. -We  discussed significant drop in ferritin levels as well as her energy levels.  No active bleeding although she has had multiple admissions for small bowel obstruction with recent ileostomy placement. -Discussed 3 doses of IV Venofer over the next couple of weeks. -Recommend follow-up in approximately 6 months with labs a few days before.  2. Vitamin D deficiency -Continue vitamin D supplements once per week. -Vitamin D level from 04/14/2023 is 33.25.  3. Thrombocytosis - Most recent CBC (04/14/23): Normal WBC 8.7, mildly elevated platelets 401. - No recent steroid use or infections.  Former smoker, quit in 2013.  No B symptoms or unintentional weight loss. - Overall picture favors reactive leukocytosis and thrombocytosis in the setting of Crohn's disease. -Watchful waiting remains appropriate for the time being.  PLAN SUMMARY: >> Recommend 3 doses of IV Venofer given about a week apart. >> Follow-up in 6 months with labs a few days before and virtual visit.     I discussed the assessment and treatment plan with the patient. The patient was provided an opportunity to ask questions and all were answered. The patient agreed with the plan and demonstrated an understanding of the instructions.   The patient was advised to call back or seek an in-person evaluation if the symptoms worsen or if the condition fails to improve as anticipated.  I spent 25 minutes dedicated to the care of this patient (face-to-face and non-face-to-face) on the date of the encounter to include what is described in the assessment and plan.  Michele Hurt, NP 04/24/2023 10:04 AM

## 2023-04-24 ENCOUNTER — Encounter: Payer: Self-pay | Admitting: Hematology

## 2023-04-24 ENCOUNTER — Encounter: Payer: Self-pay | Admitting: Gastroenterology

## 2023-04-24 DIAGNOSIS — Z7901 Long term (current) use of anticoagulants: Secondary | ICD-10-CM | POA: Diagnosis not present

## 2023-04-24 DIAGNOSIS — I10 Essential (primary) hypertension: Secondary | ICD-10-CM | POA: Diagnosis not present

## 2023-04-24 DIAGNOSIS — E860 Dehydration: Secondary | ICD-10-CM | POA: Diagnosis not present

## 2023-04-24 DIAGNOSIS — F32A Depression, unspecified: Secondary | ICD-10-CM | POA: Diagnosis not present

## 2023-04-24 DIAGNOSIS — K50019 Crohn's disease of small intestine with unspecified complications: Secondary | ICD-10-CM | POA: Diagnosis not present

## 2023-04-24 DIAGNOSIS — K9413 Enterostomy malfunction: Secondary | ICD-10-CM | POA: Diagnosis not present

## 2023-04-28 ENCOUNTER — Inpatient Hospital Stay: Payer: Medicare HMO

## 2023-04-28 VITALS — BP 138/83 | HR 90 | Temp 97.3°F | Resp 20

## 2023-04-28 DIAGNOSIS — D508 Other iron deficiency anemias: Secondary | ICD-10-CM

## 2023-04-28 DIAGNOSIS — Z932 Ileostomy status: Secondary | ICD-10-CM | POA: Diagnosis not present

## 2023-04-28 DIAGNOSIS — D509 Iron deficiency anemia, unspecified: Secondary | ICD-10-CM | POA: Diagnosis not present

## 2023-04-28 MED ORDER — CETIRIZINE HCL 10 MG PO TABS
10.0000 mg | ORAL_TABLET | Freq: Once | ORAL | Status: AC
  Administered 2023-04-28: 10 mg via ORAL
  Filled 2023-04-28: qty 1

## 2023-04-28 MED ORDER — SODIUM CHLORIDE 0.9 % IV SOLN: INTRAVENOUS | Status: DC

## 2023-04-28 MED ORDER — FAMOTIDINE IN NACL 20-0.9 MG/50ML-% IV SOLN
20.0000 mg | Freq: Once | INTRAVENOUS | Status: AC
  Administered 2023-04-28: 20 mg via INTRAVENOUS
  Filled 2023-04-28: qty 50

## 2023-04-28 MED ORDER — SODIUM CHLORIDE 0.9 % IV SOLN
300.0000 mg | Freq: Once | INTRAVENOUS | Status: AC
  Administered 2023-04-28: 300 mg via INTRAVENOUS
  Filled 2023-04-28: qty 300

## 2023-04-28 MED ORDER — OXYCODONE HCL 10 MG PO TABS: ORAL_TABLET | ORAL | 0 refills | Status: DC

## 2023-04-28 MED ORDER — ACETAMINOPHEN 325 MG PO TABS
650.0000 mg | ORAL_TABLET | Freq: Once | ORAL | Status: AC
  Administered 2023-04-28: 650 mg via ORAL
  Filled 2023-04-28: qty 2

## 2023-04-28 NOTE — Patient Instructions (Signed)

## 2023-04-28 NOTE — Progress Notes (Signed)
 Patient tolerated iron infusion with no complaints voiced.  Peripheral IV site clean and dry with good blood return noted before and after infusion.  Band aid applied.  Pt observed for 30 minutes post venofer infusion without any complications. VSS with discharge and left in satisfactory condition with no s/s of distress noted. All follow ups as scheduled.   [name] [employer]

## 2023-05-01 DIAGNOSIS — Z7901 Long term (current) use of anticoagulants: Secondary | ICD-10-CM | POA: Diagnosis not present

## 2023-05-01 DIAGNOSIS — E860 Dehydration: Secondary | ICD-10-CM | POA: Diagnosis not present

## 2023-05-01 DIAGNOSIS — K50019 Crohn's disease of small intestine with unspecified complications: Secondary | ICD-10-CM | POA: Diagnosis not present

## 2023-05-01 DIAGNOSIS — I10 Essential (primary) hypertension: Secondary | ICD-10-CM | POA: Diagnosis not present

## 2023-05-01 DIAGNOSIS — K9413 Enterostomy malfunction: Secondary | ICD-10-CM | POA: Diagnosis not present

## 2023-05-01 DIAGNOSIS — F32A Depression, unspecified: Secondary | ICD-10-CM | POA: Diagnosis not present

## 2023-05-02 ENCOUNTER — Other Ambulatory Visit: Payer: Self-pay | Admitting: Family Medicine

## 2023-05-02 DIAGNOSIS — M542 Cervicalgia: Secondary | ICD-10-CM

## 2023-05-02 DIAGNOSIS — G8929 Other chronic pain: Secondary | ICD-10-CM

## 2023-05-02 NOTE — Telephone Encounter (Signed)
 Copied from CRM [id number]. Topic: Clinical - Request for Lab/Test Order >> May 02, 2023 10:29 AM [name] wrote: Reason for CRM: Patient is still scheduled for MRI's (MR Lumbar Spine Wo Contrast &MR Cervical Spine Wo Contrast) for 04/14/2023.    Melissa with [facility] states patient's authorization with [facility] has been denied for both MRI's .   Melissa inquiring on how provider would like to proceed as the authorization has been denied.  Melissa states office can cancel procedures if needed.   Best callback number: [phone] ext [phone]

## 2023-05-04 ENCOUNTER — Ambulatory Visit (HOSPITAL_COMMUNITY): Payer: Medicare HMO

## 2023-05-04 ENCOUNTER — Encounter (HOSPITAL_COMMUNITY): Payer: Self-pay

## 2023-05-05 ENCOUNTER — Inpatient Hospital Stay: Payer: Medicare HMO

## 2023-05-05 VITALS — BP 113/74 | HR 84 | Temp 97.7°F | Resp 18

## 2023-05-05 DIAGNOSIS — D509 Iron deficiency anemia, unspecified: Secondary | ICD-10-CM | POA: Diagnosis not present

## 2023-05-05 DIAGNOSIS — D508 Other iron deficiency anemias: Secondary | ICD-10-CM

## 2023-05-05 MED ORDER — SODIUM CHLORIDE 0.9 % IV SOLN
300.0000 mg | Freq: Once | INTRAVENOUS | Status: AC
  Administered 2023-05-05: 300 mg via INTRAVENOUS
  Filled 2023-05-05: qty 300

## 2023-05-05 MED ORDER — FAMOTIDINE IN NACL 20-0.9 MG/50ML-% IV SOLN
20.0000 mg | Freq: Once | INTRAVENOUS | Status: AC
  Administered 2023-05-05: 20 mg via INTRAVENOUS
  Filled 2023-05-05: qty 50

## 2023-05-05 MED ORDER — SODIUM CHLORIDE 0.9 % IV SOLN: INTRAVENOUS | Status: DC

## 2023-05-05 MED ORDER — ACETAMINOPHEN 325 MG PO TABS
650.0000 mg | ORAL_TABLET | Freq: Once | ORAL | Status: AC
  Administered 2023-05-05: 650 mg via ORAL
  Filled 2023-05-05: qty 2

## 2023-05-05 MED ORDER — CETIRIZINE HCL 10 MG PO TABS
10.0000 mg | ORAL_TABLET | Freq: Once | ORAL | Status: AC
  Administered 2023-05-05: 10 mg via ORAL
  Filled 2023-05-05: qty 1

## 2023-05-05 NOTE — Patient Instructions (Signed)
 [facility] [place] - A DEPT OF [facility]  Discharge Instructions: Thank you for choosing [facility] to provide your oncology and hematology care.  If you have a lab appointment with the Cancer Center - please note that after April 8th, 2024, all labs will be drawn in the cancer center.  You do not have to check in or register with the main entrance as you have in the past but will complete your check-in in the cancer center.  Wear comfortable clothing and clothing appropriate for easy access to any Portacath or PICC line.   We strive to give you quality time with your provider. You may need to reschedule your appointment if you arrive late (15 or more minutes).  Arriving late affects you and other patients whose appointments are after yours.  Also, if you miss three or more appointments without notifying the office, you may be dismissed from the clinic at the provider's discretion.      For prescription refill requests, have your pharmacy contact our office and allow 72 hours for refills to be completed.    Today you received the following:  Venofer.    Iron Sucrose Injection What is this medication? IRON SUCROSE (EYE ern SOO krose) treats low levels of iron (iron deficiency anemia) in people with kidney disease. Iron is a mineral that plays an important role in making red blood cells, which carry oxygen from your lungs to the rest of your body. This medicine may be used for other purposes; ask your health care provider or pharmacist if you have questions. COMMON BRAND NAME(S): Venofer What should I tell my care team before I take this medication? They need to know if you have any of these conditions: Anemia not caused by low iron levels Heart disease High levels of iron in the blood Kidney disease Liver disease An unusual or allergic reaction to iron, other medications, foods, dyes, or preservatives Pregnant or trying to get  pregnant Breastfeeding How should I use this medication? This medication is infused into a vein. It is given by your care team in a hospital or clinic setting. Talk to your care team about the use of this medication in children. While it may be prescribed for children as young as 2 years for selected conditions, precautions do apply. Overdosage: If you think you have taken too much of this medicine contact a poison control center or emergency room at once. NOTE: This medicine is only for you. Do not share this medicine with others. What if I miss a dose? Keep appointments for follow-up doses. It is important not to miss your dose. Call your care team if you are unable to keep an appointment. What may interact with this medication? Do not take this medication with any of the following: Deferoxamine Dimercaprol Other iron products This medication may also interact with the following: Chloramphenicol Deferasirox This list may not describe all possible interactions. Give your health care provider a list of all the medicines, herbs, non-prescription drugs, or dietary supplements you use. Also tell them if you smoke, drink alcohol, or use illegal drugs. Some items may interact with your medicine. What should I watch for while using this medication? Visit your care team for regular checks on your progress. Tell your care team if your symptoms do not start to get better or if they get worse. You may need blood work done while you are taking this medication. You may need to eat  more foods that contain iron. Talk to your care team. Foods that contain iron include whole grains or cereals, dried fruits, beans, peas, leafy green vegetables, and organ meats (liver, kidney). What side effects may I notice from receiving this medication? Side effects that you should report to your care team as soon as possible: Allergic reactions--skin rash, itching, hives, swelling of the face, lips, tongue, or throat Low  blood pressure--dizziness, feeling faint or lightheaded, blurry vision Shortness of breath Side effects that usually do not require medical attention (report to your care team if they continue or are bothersome): Flushing Headache Joint pain Muscle pain Nausea Pain, redness, or irritation at injection site This list may not describe all possible side effects. Call your doctor for medical advice about side effects. You may report side effects to FDA at 1-800-FDA-1088. Where should I keep my medication? This medication is given in a hospital or clinic. It will not be stored at home. NOTE: This sheet is a summary. It may not cover all possible information. If you have questions about this medicine, talk to your doctor, pharmacist, or health care provider.  2024 Elsevier/Gold Standard (2022-10-12 00:00:00)   To help prevent nausea and vomiting after your treatment, we encourage you to take your nausea medication as directed.  BELOW ARE SYMPTOMS THAT SHOULD BE REPORTED IMMEDIATELY: *FEVER GREATER THAN 100.4 F (38 C) OR HIGHER *CHILLS OR SWEATING *NAUSEA AND VOMITING THAT IS NOT CONTROLLED WITH YOUR NAUSEA MEDICATION *UNUSUAL SHORTNESS OF BREATH *UNUSUAL BRUISING OR BLEEDING *URINARY PROBLEMS (pain or burning when urinating, or frequent urination) *BOWEL PROBLEMS (unusual diarrhea, constipation, pain near the anus) TENDERNESS IN MOUTH AND THROAT WITH OR WITHOUT PRESENCE OF ULCERS (sore throat, sores in mouth, or a toothache) UNUSUAL RASH, SWELLING OR PAIN  UNUSUAL VAGINAL DISCHARGE OR ITCHING   Items with * indicate a potential emergency and should be followed up as soon as possible or go to the Emergency Department if any problems should occur.  Please show the CHEMOTHERAPY ALERT CARD or IMMUNOTHERAPY ALERT CARD at check-in to the Emergency Department and triage nurse.  Should you have questions after your visit or need to cancel or reschedule your appointment, please contact [facility] CANCER  CT[facility] [place] - A DEPT OF [facility] [phone]  and follow the prompts.  Office hours are 8:00 a.m. to 4:30 p.m. Monday - Friday. Please note that voicemails left after 4:00 p.m. may not be returned until the following business day.  We are closed weekends and major holidays. You have access to a nurse at all times for urgent questions. Please call the main number to the clinic [phone] and follow the prompts.  For any non-urgent questions, you may also contact your provider using MyChart. We now offer e-Visits for anyone 24 and older to request care online for non-urgent symptoms. For details visit [email or web address].   Also download the MyChart app! Go to the app store, search "MyChart", open the app, select [place], and log in with your MyChart username and password.

## 2023-05-05 NOTE — Progress Notes (Signed)
Stable during infusion without adverse affects.  Vital signs stable.  No complaints at this time.  Discharge from clinic ambulatory in stable condition.  Alert and oriented X 3.  Follow up with [facility] as scheduled.

## 2023-05-05 NOTE — Progress Notes (Signed)
Patient presents today for iron infusion.  Patient is in satisfactory condition with no new complaints voiced.  Vital signs are stable.  IV placed in L arm.  IV flushed well with good blood return noted.  We will proceed with infusion per provider orders.

## 2023-05-07 ENCOUNTER — Encounter: Payer: Self-pay | Admitting: Family Medicine

## 2023-05-07 ENCOUNTER — Other Ambulatory Visit: Payer: Self-pay | Admitting: Hematology

## 2023-05-07 DIAGNOSIS — G8929 Other chronic pain: Secondary | ICD-10-CM

## 2023-05-08 ENCOUNTER — Encounter: Payer: Self-pay | Admitting: Hematology

## 2023-05-08 ENCOUNTER — Encounter: Payer: Self-pay | Admitting: Gastroenterology

## 2023-05-09 ENCOUNTER — Other Ambulatory Visit: Payer: Self-pay | Admitting: Family Medicine

## 2023-05-09 DIAGNOSIS — K50012 Crohn's disease of small intestine with intestinal obstruction: Secondary | ICD-10-CM | POA: Diagnosis not present

## 2023-05-09 DIAGNOSIS — E860 Dehydration: Secondary | ICD-10-CM | POA: Diagnosis not present

## 2023-05-09 DIAGNOSIS — Z932 Ileostomy status: Secondary | ICD-10-CM | POA: Diagnosis not present

## 2023-05-09 MED ORDER — OXYCODONE HCL 10 MG PO TABS: ORAL_TABLET | ORAL | 0 refills | Status: DC

## 2023-05-09 NOTE — Progress Notes (Signed)
 Robaxin removed

## 2023-05-12 ENCOUNTER — Inpatient Hospital Stay: Payer: Medicare HMO | Attending: Hematology

## 2023-05-12 VITALS — BP 131/67 | HR 78 | Temp 97.6°F | Resp 18

## 2023-05-12 DIAGNOSIS — D509 Iron deficiency anemia, unspecified: Secondary | ICD-10-CM | POA: Diagnosis not present

## 2023-05-12 DIAGNOSIS — D508 Other iron deficiency anemias: Secondary | ICD-10-CM

## 2023-05-12 MED ORDER — FAMOTIDINE IN NACL 20-0.9 MG/50ML-% IV SOLN
20.0000 mg | Freq: Once | INTRAVENOUS | Status: AC
  Administered 2023-05-12: 20 mg via INTRAVENOUS
  Filled 2023-05-12: qty 50

## 2023-05-12 MED ORDER — SODIUM CHLORIDE 0.9 % IV SOLN
300.0000 mg | Freq: Once | INTRAVENOUS | Status: AC
  Administered 2023-05-12: 300 mg via INTRAVENOUS
  Filled 2023-05-12: qty 300

## 2023-05-12 MED ORDER — CETIRIZINE HCL 10 MG PO TABS
10.0000 mg | ORAL_TABLET | Freq: Once | ORAL | Status: DC
Start: 2023-05-12 — End: 2023-05-12

## 2023-05-12 MED ORDER — ACETAMINOPHEN 325 MG PO TABS: 650.0000 mg | ORAL_TABLET | Freq: Once | ORAL | Status: DC

## 2023-05-12 MED ORDER — SODIUM CHLORIDE 0.9 % IV SOLN: INTRAVENOUS | Status: DC

## 2023-05-12 NOTE — Progress Notes (Signed)
 Patient tolerated iron infusion with no complaints voiced.  Peripheral IV site clean and dry with good blood return noted before and after infusion.  Band aid applied.  Pt observed for 30 minutes post venofer infusion without any complications. VSS with discharge and left in satisfactory condition with no s/s of distress noted. All follow ups as scheduled.   [name] [employer]

## 2023-05-12 NOTE — Patient Instructions (Signed)

## 2023-05-15 DIAGNOSIS — F32A Depression, unspecified: Secondary | ICD-10-CM | POA: Diagnosis not present

## 2023-05-15 DIAGNOSIS — K9413 Enterostomy malfunction: Secondary | ICD-10-CM | POA: Diagnosis not present

## 2023-05-15 DIAGNOSIS — K50019 Crohn's disease of small intestine with unspecified complications: Secondary | ICD-10-CM | POA: Diagnosis not present

## 2023-05-15 DIAGNOSIS — I10 Essential (primary) hypertension: Secondary | ICD-10-CM | POA: Diagnosis not present

## 2023-05-15 DIAGNOSIS — Z7901 Long term (current) use of anticoagulants: Secondary | ICD-10-CM | POA: Diagnosis not present

## 2023-05-15 DIAGNOSIS — E860 Dehydration: Secondary | ICD-10-CM | POA: Diagnosis not present

## 2023-05-16 DIAGNOSIS — Z09 Encounter for follow-up examination after completed treatment for conditions other than malignant neoplasm: Secondary | ICD-10-CM | POA: Diagnosis not present

## 2023-05-16 DIAGNOSIS — K50012 Crohn's disease of small intestine with intestinal obstruction: Secondary | ICD-10-CM | POA: Diagnosis not present

## 2023-05-17 ENCOUNTER — Encounter: Payer: Self-pay | Admitting: Family Medicine

## 2023-05-18 NOTE — Telephone Encounter (Signed)
Scheduled for tomorrow in office

## 2023-05-19 ENCOUNTER — Ambulatory Visit: Admitting: Family Medicine

## 2023-05-23 DIAGNOSIS — K9413 Enterostomy malfunction: Secondary | ICD-10-CM | POA: Diagnosis not present

## 2023-05-23 DIAGNOSIS — F32A Depression, unspecified: Secondary | ICD-10-CM | POA: Diagnosis not present

## 2023-05-23 DIAGNOSIS — Z7901 Long term (current) use of anticoagulants: Secondary | ICD-10-CM | POA: Diagnosis not present

## 2023-05-23 DIAGNOSIS — K50019 Crohn's disease of small intestine with unspecified complications: Secondary | ICD-10-CM | POA: Diagnosis not present

## 2023-05-23 DIAGNOSIS — E860 Dehydration: Secondary | ICD-10-CM | POA: Diagnosis not present

## 2023-05-23 DIAGNOSIS — I10 Essential (primary) hypertension: Secondary | ICD-10-CM | POA: Diagnosis not present

## 2023-05-25 ENCOUNTER — Ambulatory Visit: Admitting: Physical Medicine and Rehabilitation

## 2023-05-25 ENCOUNTER — Encounter: Payer: Self-pay | Admitting: Physical Medicine and Rehabilitation

## 2023-05-25 DIAGNOSIS — M4310 Spondylolisthesis, site unspecified: Secondary | ICD-10-CM | POA: Diagnosis not present

## 2023-05-25 DIAGNOSIS — M5416 Radiculopathy, lumbar region: Secondary | ICD-10-CM | POA: Diagnosis not present

## 2023-05-25 DIAGNOSIS — M47816 Spondylosis without myelopathy or radiculopathy, lumbar region: Secondary | ICD-10-CM | POA: Diagnosis not present

## 2023-05-25 DIAGNOSIS — G8929 Other chronic pain: Secondary | ICD-10-CM

## 2023-05-25 DIAGNOSIS — M5441 Lumbago with sciatica, right side: Secondary | ICD-10-CM

## 2023-05-25 DIAGNOSIS — M5442 Lumbago with sciatica, left side: Secondary | ICD-10-CM | POA: Diagnosis not present

## 2023-05-25 NOTE — Progress Notes (Signed)
 Pain Scale   Average Pain 8 Radiating bilateral legs
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
## 2023-05-27 ENCOUNTER — Other Ambulatory Visit: Payer: Self-pay | Admitting: Family Medicine

## 2023-05-27 DIAGNOSIS — Z932 Ileostomy status: Secondary | ICD-10-CM | POA: Diagnosis not present

## 2023-05-28 ENCOUNTER — Encounter: Payer: Self-pay | Admitting: Family Medicine

## 2023-05-30 ENCOUNTER — Encounter: Payer: Self-pay | Admitting: Family Medicine

## 2023-05-30 ENCOUNTER — Other Ambulatory Visit: Payer: Self-pay | Admitting: Family Medicine

## 2023-05-31 ENCOUNTER — Telehealth: Admitting: Family Medicine

## 2023-05-31 DIAGNOSIS — M5442 Lumbago with sciatica, left side: Secondary | ICD-10-CM

## 2023-05-31 DIAGNOSIS — G8929 Other chronic pain: Secondary | ICD-10-CM | POA: Diagnosis not present

## 2023-05-31 DIAGNOSIS — M5441 Lumbago with sciatica, right side: Secondary | ICD-10-CM

## 2023-05-31 MED ORDER — OXYCODONE HCL 10 MG PO TABS: ORAL_TABLET | ORAL | 0 refills | Status: AC

## 2023-05-31 NOTE — Progress Notes (Signed)
 Virtual Visit via Video Note  I connected with [name] on 05/31/23 at 11:40 AM EDT by a video enabled telemedicine application and verified that I am speaking with the correct person using two identifiers.  Location: Patient: home Provider: office   I discussed the limitations of evaluation and management by telemedicine and the availability of in person appointments. The patient expressed understanding and agreed to proceed.  History of Present Illness: LBP radiating down both legs to proximal knees rigth worse than left , reports weKNESS AND NUMBNESS, NO NEAR FALLS, ICE GIOVES SOME RELIEF, HEAT ALSO HELPS , LEG ELEVATION HELPS AT NIGHT BUT IS AWAKENED RATED AT 10 AFTER SITTING FOR ABOUT  10 MINS PAIN   Observations/Objective: There were no vitals taken for this visit. Good communication with no confusion and intact memory. Alert and oriented x 3 No signs of respiratory distress during speech, pt in popain   Assessment and Plan: Back pain Uncontrolled pain, which has significantly worsened in past 4 to 6 months, oxycodone prescribed x 2 months , will ave OV in the interim   Follow Up Instructions:    I discussed the assessment and treatment plan with the patient. The patient was provided an opportunity to ask questions and all were answered. The patient agreed with the plan and demonstrated an understanding of the instructions.   The patient was advised to call back or seek an in-person evaluation if the symptoms worsen or if the condition fails to improve as anticipated.  I provided 15 minutes of non-face-to-face time during this encounter.   Syliva Overman, MD

## 2023-05-31 NOTE — Patient Instructions (Signed)
 F/U in May as before call if you need me sooner

## 2023-06-01 DIAGNOSIS — E860 Dehydration: Secondary | ICD-10-CM | POA: Diagnosis not present

## 2023-06-01 DIAGNOSIS — F32A Depression, unspecified: Secondary | ICD-10-CM | POA: Diagnosis not present

## 2023-06-01 DIAGNOSIS — K9413 Enterostomy malfunction: Secondary | ICD-10-CM | POA: Diagnosis not present

## 2023-06-01 DIAGNOSIS — Z7901 Long term (current) use of anticoagulants: Secondary | ICD-10-CM | POA: Diagnosis not present

## 2023-06-01 DIAGNOSIS — K50019 Crohn's disease of small intestine with unspecified complications: Secondary | ICD-10-CM | POA: Diagnosis not present

## 2023-06-01 DIAGNOSIS — I10 Essential (primary) hypertension: Secondary | ICD-10-CM | POA: Diagnosis not present

## 2023-06-07 DIAGNOSIS — F319 Bipolar disorder, unspecified: Secondary | ICD-10-CM | POA: Diagnosis not present

## 2023-06-07 DIAGNOSIS — N1831 Chronic kidney disease, stage 3a: Secondary | ICD-10-CM | POA: Diagnosis not present

## 2023-06-07 DIAGNOSIS — E209 Hypoparathyroidism, unspecified: Secondary | ICD-10-CM | POA: Diagnosis not present

## 2023-06-07 DIAGNOSIS — Z8249 Family history of ischemic heart disease and other diseases of the circulatory system: Secondary | ICD-10-CM | POA: Diagnosis not present

## 2023-06-07 DIAGNOSIS — K5090 Crohn's disease, unspecified, without complications: Secondary | ICD-10-CM | POA: Diagnosis not present

## 2023-06-07 DIAGNOSIS — B182 Chronic viral hepatitis C: Secondary | ICD-10-CM | POA: Diagnosis not present

## 2023-06-07 DIAGNOSIS — K219 Gastro-esophageal reflux disease without esophagitis: Secondary | ICD-10-CM | POA: Diagnosis not present

## 2023-06-07 DIAGNOSIS — E669 Obesity, unspecified: Secondary | ICD-10-CM | POA: Diagnosis not present

## 2023-06-07 DIAGNOSIS — Z87891 Personal history of nicotine dependence: Secondary | ICD-10-CM | POA: Diagnosis not present

## 2023-06-07 DIAGNOSIS — J4489 Other specified chronic obstructive pulmonary disease: Secondary | ICD-10-CM | POA: Diagnosis not present

## 2023-06-07 DIAGNOSIS — D84821 Immunodeficiency due to drugs: Secondary | ICD-10-CM | POA: Diagnosis not present

## 2023-06-07 DIAGNOSIS — I129 Hypertensive chronic kidney disease with stage 1 through stage 4 chronic kidney disease, or unspecified chronic kidney disease: Secondary | ICD-10-CM | POA: Diagnosis not present

## 2023-06-09 DIAGNOSIS — Z932 Ileostomy status: Secondary | ICD-10-CM | POA: Diagnosis not present

## 2023-06-16 ENCOUNTER — Encounter: Payer: Self-pay | Admitting: Family Medicine

## 2023-06-16 DIAGNOSIS — K50012 Crohn's disease of small intestine with intestinal obstruction: Secondary | ICD-10-CM | POA: Diagnosis not present

## 2023-06-16 MED ORDER — OXYCODONE HCL 10 MG PO TABS: ORAL_TABLET | ORAL | 0 refills | Status: DC

## 2023-06-16 NOTE — Assessment & Plan Note (Signed)
 Uncontrolled pain, which has significantly worsened in past 4 to 6 months, oxycodone prescribed x 2 months , will ave OV in the interim

## 2023-06-22 DIAGNOSIS — K660 Peritoneal adhesions (postprocedural) (postinfection): Secondary | ICD-10-CM | POA: Diagnosis not present

## 2023-06-22 DIAGNOSIS — G8918 Other acute postprocedural pain: Secondary | ICD-10-CM | POA: Diagnosis not present

## 2023-06-22 DIAGNOSIS — K50012 Crohn's disease of small intestine with intestinal obstruction: Secondary | ICD-10-CM | POA: Diagnosis not present

## 2023-06-23 DIAGNOSIS — Z432 Encounter for attention to ileostomy: Secondary | ICD-10-CM | POA: Diagnosis not present

## 2023-06-23 DIAGNOSIS — Z888 Allergy status to other drugs, medicaments and biological substances status: Secondary | ICD-10-CM | POA: Diagnosis not present

## 2023-06-23 DIAGNOSIS — K219 Gastro-esophageal reflux disease without esophagitis: Secondary | ICD-10-CM | POA: Diagnosis not present

## 2023-06-23 DIAGNOSIS — Z87891 Personal history of nicotine dependence: Secondary | ICD-10-CM | POA: Diagnosis not present

## 2023-06-23 DIAGNOSIS — Z602 Problems related to living alone: Secondary | ICD-10-CM | POA: Diagnosis not present

## 2023-06-23 DIAGNOSIS — Z91018 Allergy to other foods: Secondary | ICD-10-CM | POA: Diagnosis not present

## 2023-06-23 DIAGNOSIS — K660 Peritoneal adhesions (postprocedural) (postinfection): Secondary | ICD-10-CM | POA: Diagnosis not present

## 2023-06-23 DIAGNOSIS — D509 Iron deficiency anemia, unspecified: Secondary | ICD-10-CM | POA: Diagnosis not present

## 2023-06-23 DIAGNOSIS — D6869 Other thrombophilia: Secondary | ICD-10-CM | POA: Diagnosis not present

## 2023-06-23 DIAGNOSIS — I10 Essential (primary) hypertension: Secondary | ICD-10-CM | POA: Diagnosis not present

## 2023-06-23 DIAGNOSIS — K50012 Crohn's disease of small intestine with intestinal obstruction: Secondary | ICD-10-CM | POA: Diagnosis not present

## 2023-06-23 DIAGNOSIS — E66811 Obesity, class 1: Secondary | ICD-10-CM | POA: Diagnosis not present

## 2023-06-23 DIAGNOSIS — Z91013 Allergy to seafood: Secondary | ICD-10-CM | POA: Diagnosis not present

## 2023-06-23 DIAGNOSIS — G4700 Insomnia, unspecified: Secondary | ICD-10-CM | POA: Diagnosis not present

## 2023-06-23 DIAGNOSIS — F319 Bipolar disorder, unspecified: Secondary | ICD-10-CM | POA: Diagnosis not present

## 2023-06-23 DIAGNOSIS — Z98891 History of uterine scar from previous surgery: Secondary | ICD-10-CM | POA: Diagnosis not present

## 2023-06-23 DIAGNOSIS — Z9109 Other allergy status, other than to drugs and biological substances: Secondary | ICD-10-CM | POA: Diagnosis not present

## 2023-06-23 DIAGNOSIS — Z885 Allergy status to narcotic agent status: Secondary | ICD-10-CM | POA: Diagnosis not present

## 2023-06-23 DIAGNOSIS — M519 Unspecified thoracic, thoracolumbar and lumbosacral intervertebral disc disorder: Secondary | ICD-10-CM | POA: Diagnosis not present

## 2023-06-23 DIAGNOSIS — Z6830 Body mass index (BMI) 30.0-30.9, adult: Secondary | ICD-10-CM | POA: Diagnosis not present

## 2023-06-23 DIAGNOSIS — Z79899 Other long term (current) drug therapy: Secondary | ICD-10-CM | POA: Diagnosis not present

## 2023-06-27 ENCOUNTER — Ambulatory Visit: Payer: Medicare HMO | Admitting: Family Medicine

## 2023-06-28 ENCOUNTER — Telehealth: Payer: Self-pay

## 2023-06-28 NOTE — Transitions of Care (Post Inpatient/ED Visit) (Signed)
   06/28/2023  Name: [name] MRN: [id number] DOB: [birth year]  Today's TOC FU Call Status: Today's TOC FU Call Status:: Successful TOC FU Call Completed TOC FU Call Complete Date: 06/28/23 Patient's Name and Date of Birth confirmed.  Transition Care Management Follow-up Telephone Call Date of Discharge: 06/27/23 Discharge Facility: Other [facility]) Name of Other (Non[facility]) Discharge Facility: [facility] Type of Discharge: Inpatient Admission Primary Inpatient Discharge Diagnosis:: Crohn's disease of small intestine with intestinal obstruction How have you been since you were released from the hospital?: Better (reports that she is feeling better.  Reports drinking well. had a bowel movement-nl) Any questions or concerns?: No  Items Reviewed: Did you receive and understand the discharge instructions provided?: Yes Medications obtained,verified, and reconciled?: No Medications Not Reviewed Reasons:: Other: (patient verbally reports that she has all her medications and then she hung up call without review.) Any new allergies since your discharge?: No Dietary orders reviewed?: Yes Type of Diet Ordered:: regular diet. Avoiding fresh fruits and vegs. Do you have support at home?: Yes People in Home [RPT]: child(ren), adult, spouse  Medications Reviewed Today: Medications Reviewed Today   Medications were not reviewed in this encounter     Home Care and Equipment/Supplies: Were Home Health Services Ordered?: No Any new equipment or medical supplies ordered?: No  Functional Questionnaire: Do you need assistance with bathing/showering or dressing?: No Do you need assistance with meal preparation?: No Do you need assistance with eating?: No Do you have difficulty maintaining continence: No Do you need assistance with getting out of bed/getting out of a chair/moving?: No Do you have difficulty managing or taking your medications?: No  Follow up appointments reviewed: PCP  Follow-up appointment confirmed?: No (denies needing to go to Dr. Rodolph Clap) MD Provider Line Number:[phone] Given: No Specialist Hospital Follow-up appointment confirmed?: Yes   Placed call to patient. Reviewed purpose of call. During follow up template when I reviewed importance of PCP follow up patient got upset and states that she will wait until her surgeon tells her to follow up with PCP. Then patient states that she is gonna cut the call short and to have a nice day. Patient then hangs up on me.  Transition of care call and assessments not completed.   Orpha Blade, RN, BSN, CEN [employer]- Transition of Care Team.  [facility] [phone]

## 2023-06-29 ENCOUNTER — Ambulatory Visit: Admitting: Professional Counselor

## 2023-06-29 DIAGNOSIS — F330 Major depressive disorder, recurrent, mild: Secondary | ICD-10-CM

## 2023-06-29 NOTE — Patient Instructions (Signed)
 Refer to [facility]

## 2023-06-29 NOTE — BH Specialist Note (Cosign Needed)
 [facility] Telephone Follow-up  MRN: [id number] NAME: NADALIE LAUGHNER Date: 06/29/23  Start time: Start Time: 1000 End time: Stop Time: 1045 Total time: Total Time in Minutes (Visit): 45 Call number: Visit Number: Additional Visit  Reason for call today:  The patient is a 53 year old female who presented for a follow-up visit after an extended absence from collaborative care. She initiated contact to schedule this appointment, reporting that she has been struggling more recently. The patient shared that ongoing medical issues, including recent surgeries, have contributed to a worsening of her depressive symptoms. Additionally, she continues to experience grief related to the loss of her son, which remains a significant source of emotional distress.  Earlier in the course of collaborative care, the patient had shown marked improvement with brief interventions and medication management. Her progress had reached a point where sessions became less frequent, and plans were being made for her transition out of the program. However, she eventually stopped attending follow-ups before a formal transition occurred.  Given the recent increase in depressive symptoms and her expressed need for more consistent support, we discussed the next steps in her care. The patient was receptive to and in agreement with a referral to traditional therapy for long-term support, as well as a referral to traditional psychiatry for ongoing medication management and evaluation.  At this time, we will formally close the collaborative care episode, as the program has provided significant benefit and fulfilled its purpose. The patient is being transitioned to a more traditional model of care to better meet her evolving needs.    PHQ-9 Scores:     06/29/2023   10:13 AM 04/14/2023   10:53 AM 02/24/2023    1:10 PM 01/27/2023    4:06 PM 12/23/2022    9:52 AM  Depression screen PHQ 2/9  Decreased Interest 2 0 0 1 1   Down, Depressed, Hopeless 2 0 0 1 1  PHQ - 2 Score 4 0 0 2 2  Altered sleeping 2   2 2   Tired, decreased energy 2   1 1   Change in appetite 2   0 0  Feeling bad or failure about yourself  0   0 0  Trouble concentrating 0   0 0  Moving slowly or fidgety/restless 0   0 0  Suicidal thoughts 0   0 0  PHQ-9 Score 10   5 5   Difficult doing work/chores Somewhat difficult   Somewhat difficult Somewhat difficult   GAD-7 Scores:     06/29/2023   10:14 AM 01/27/2023    4:07 PM 12/23/2022    9:53 AM 11/18/2022   10:18 AM  GAD 7 : Generalized Anxiety Score  Nervous, Anxious, on Edge 0 0 0 0  Control/stop worrying 0 0 0 0  Worry too much - different things 0 0 1 1  Trouble relaxing 1 1 1 1   Restless 1 0 0 0  Easily annoyed or irritable 0 1 0 1  Afraid - awful might happen 0 0 0 0  Total GAD 7 Score 2 2 2 3   Anxiety Difficulty Somewhat difficult Not difficult at all Somewhat difficult Somewhat difficult    Stress Current stressors:  medical problems  Sleep:  disrupted Appetite:  fair Coping ability:  fair Patient taking medications as prescribed:  Yes  Current medications:  Outpatient Encounter Medications as of 06/29/2023  Medication Sig   budesonide  (ENTOCORT EC ) 3 MG 24 hr capsule Take by mouth.   calcium-vitamin D  (  OSCAL WITH D) 500-200 MG-UNIT tablet Take 1 tablet by mouth daily.   dicyclomine  (BENTYL ) 10 MG capsule Take 1 capsule (10 mg total) by mouth 4 (four) times daily -  before meals and at bedtime. For stomach cramping. Stop if constipation. Please use sparingly as can slow intestinal transit.   hydrOXYzine  (VISTARIL ) 100 MG capsule Take one capsule by mouth every night for sleep and anxiety   magnesium  oxide (MAG-OX) 400 (240 Mg) MG tablet Take 400 mg by mouth daily.   Multiple Vitamin (MULTIVITAMIN WITH MINERALS) TABS tablet Take 1 tablet by mouth daily.   olmesartan  (BENICAR ) 20 MG tablet TAKE 1 TABLET(20 MG) BY MOUTH DAILY   Omega-3 Fatty Acids (FISH OIL) 1200 MG CAPS  Take 1,200 mg by mouth daily.    ondansetron  (ZOFRAN -ODT) 4 MG disintegrating tablet DISSOLVE 1 TABLET(4 MG) ON THE TONGUE EVERY 8 HOURS AS NEEDED FOR NAUSEA OR VOMITING   Oxycodone  HCl 10 MG TABS Take one tablet by mouth once daily , for uncontrolled back  pain   Oxycodone  HCl 10 MG TABS Take one tablet by mouth two tiimes daily for uncontrolled back pain   [START ON 06/30/2023] Oxycodone  HCl 10 MG TABS Take one tablet by mouth two times daily for pain   pantoprazole  (PROTONIX ) 40 MG tablet Take 1 tablet (40 mg total) by mouth daily.   predniSONE  (DELTASONE ) 10 MG tablet Take 1 tablet (10 mg total) by mouth daily with breakfast.   Probiotic Product (PROBIOTIC PO) Take 1 tablet by mouth daily.   Pyridoxine HCl (VITAMIN B6 PO) Take 1 tablet by mouth daily.   Ustekinumab  (STELARA  [place]) Inject 90 mg into the skin as directed. Every 8 weeks   venlafaxine  XR (EFFEXOR -XR) 37.5 MG 24 hr capsule TAKE 1 CAPSULE(37.5 MG) BY MOUTH DAILY WITH BREAKFAST   vitamin C (ASCORBIC ACID) 500 MG tablet Take 500 mg by mouth daily.   Vitamin D , Ergocalciferol , (DRISDOL ) 1.25 MG (50000 UNIT) CAPS capsule TAKE ONE CAPSULE BY MOUTH ONCE A WEEK   [DISCONTINUED] methocarbamol  (ROBAXIN ) 500 MG tablet Take 1 tablet (500 mg total) by mouth 3 (three) times daily.   No facility-administered encounter medications on file as of 06/29/2023.     Self-harm Behaviors Risk Assessment Self-harm risk factors:   Patient endorses recent thoughts of harming self:  Denies  [place] Suicide Severity Rating Scale:  Flowsheet Row Integrated Behavioral Health from 06/29/2023 in [facility] from 04/05/2023 in [facility]al [facility] from 01/27/2023 in [facility]  C-SSRS RISK CATEGORY Moderate Risk No Risk No Risk        Danger to Others Risk Assessment Danger to others risk factors:  None Patient endorses recent thoughts of  harming others:  Denies   Substance Use Assessment Patient recently consumed alcohol:  None  Alcohol Use Disorder Identification Test (AUDIT):     08/19/2020    3:57 PM 08/24/2021    9:53 AM 07/22/2022    5:52 AM 08/29/2022    9:18 AM 12/20/2022    3:31 PM  Alcohol Use Disorder Test (AUDIT)  1. How often do you have a drink containing alcohol? 0 0 0 0 0  2. How many drinks containing alcohol do you have on a typical day when you are drinking? 0 0     3. How often do you have six or more drinks on one occasion? 0 0 0 0 0  AUDIT-C Score 0  0       Goals, Interventions and Follow-up Plan Goals: Increase healthy adjustment to current life circumstances Interventions: CBT Cognitive Behavioral Therapy Follow-up Plan:  Refer to psychiatry    Regina Capes

## 2023-07-10 ENCOUNTER — Other Ambulatory Visit: Payer: Self-pay | Admitting: Family Medicine

## 2023-07-11 ENCOUNTER — Encounter: Payer: Self-pay | Admitting: Family Medicine

## 2023-07-11 ENCOUNTER — Ambulatory Visit (INDEPENDENT_AMBULATORY_CARE_PROVIDER_SITE_OTHER): Payer: Medicare HMO | Admitting: Family Medicine

## 2023-07-11 VITALS — BP 120/82 | HR 75 | Resp 18 | Ht 67.5 in | Wt 188.1 lb

## 2023-07-11 DIAGNOSIS — D75839 Thrombocytosis, unspecified: Secondary | ICD-10-CM | POA: Diagnosis not present

## 2023-07-11 DIAGNOSIS — K50812 Crohn's disease of both small and large intestine with intestinal obstruction: Secondary | ICD-10-CM

## 2023-07-11 DIAGNOSIS — M5442 Lumbago with sciatica, left side: Secondary | ICD-10-CM | POA: Diagnosis not present

## 2023-07-11 DIAGNOSIS — I10 Essential (primary) hypertension: Secondary | ICD-10-CM

## 2023-07-11 DIAGNOSIS — E876 Hypokalemia: Secondary | ICD-10-CM

## 2023-07-11 DIAGNOSIS — Z1231 Encounter for screening mammogram for malignant neoplasm of breast: Secondary | ICD-10-CM

## 2023-07-11 DIAGNOSIS — M5441 Lumbago with sciatica, right side: Secondary | ICD-10-CM | POA: Diagnosis not present

## 2023-07-11 DIAGNOSIS — F4321 Adjustment disorder with depressed mood: Secondary | ICD-10-CM | POA: Diagnosis not present

## 2023-07-11 DIAGNOSIS — E559 Vitamin D deficiency, unspecified: Secondary | ICD-10-CM

## 2023-07-11 DIAGNOSIS — G8929 Other chronic pain: Secondary | ICD-10-CM | POA: Diagnosis not present

## 2023-07-11 DIAGNOSIS — Z9889 Other specified postprocedural states: Secondary | ICD-10-CM | POA: Diagnosis not present

## 2023-07-11 DIAGNOSIS — D508 Other iron deficiency anemias: Secondary | ICD-10-CM | POA: Diagnosis not present

## 2023-07-11 DIAGNOSIS — K50012 Crohn's disease of small intestine with intestinal obstruction: Secondary | ICD-10-CM | POA: Diagnosis not present

## 2023-07-11 NOTE — Patient Instructions (Addendum)
 F/u in 4 months, call if you need me sooner  Pls schedule mammogram  at checkout  I will send message to P Bynum  for your appt  Labs today, cBC, iron , ferritin, B12, folate, lipid, TSH, PTH, Vit D, cmp an EGFr, lipid, magnesium   No med changes  It is important that you exercise regularly at least 30 minutes 5 times a week. If you develop chest pain, have severe difficulty breathing, or feel very tired, stop exercising immediately and seek medical attention

## 2023-07-12 ENCOUNTER — Encounter: Payer: Self-pay | Admitting: Physical Medicine and Rehabilitation

## 2023-07-13 ENCOUNTER — Encounter: Payer: Self-pay | Admitting: Family Medicine

## 2023-07-13 DIAGNOSIS — E559 Vitamin D deficiency, unspecified: Secondary | ICD-10-CM | POA: Insufficient documentation

## 2023-07-13 LAB — CMP14+EGFR
ALT: 24 IU/L (ref 0–32)
AST: 18 IU/L (ref 0–40)
Albumin: 4.2 g/dL (ref 3.8–4.9)
Alkaline Phosphatase: 96 IU/L (ref 44–121)
BUN/Creatinine Ratio: 11 (ref 9–23)
BUN: 8 mg/dL (ref 6–24)
Bilirubin Total: <0.2 mg/dL (ref 0.0–1.2)
CO2: 25 mmol/L (ref 20–29)
Calcium: 9.6 mg/dL (ref 8.7–10.2)
Chloride: 102 mmol/L (ref 96–106)
Creatinine, Ser: 0.76 mg/dL (ref 0.57–1.00)
Globulin, Total: 2.9 g/dL (ref 1.5–4.5)
Glucose: 92 mg/dL (ref 70–99)
Potassium: 3.6 mmol/L (ref 3.5–5.2)
Sodium: 143 mmol/L (ref 134–144)
Total Protein: 7.1 g/dL (ref 6.0–8.5)
eGFR: 94 mL/min/{1.73_m2} (ref >59)

## 2023-07-13 LAB — CBC WITH DIFFERENTIAL/PLATELET
Basophils Absolute: 0.1 10*3/uL (ref 0.0–0.2)
Basos: 1 %
EOS (ABSOLUTE): 0.3 10*3/uL (ref 0.0–0.4)
Eos: 4 %
Hematocrit: 33.6 % — ABNORMAL LOW (ref 34.0–46.6)
Hemoglobin: 10.6 g/dL — ABNORMAL LOW (ref 11.1–15.9)
Immature Grans (Abs): 0 10*3/uL (ref 0.0–0.1)
Immature Granulocytes: 0 %
Lymphocytes Absolute: 4 10*3/uL — ABNORMAL HIGH (ref 0.7–3.1)
Lymphs: 52 %
MCH: 26.5 pg — ABNORMAL LOW (ref 26.6–33.0)
MCHC: 31.5 g/dL (ref 31.5–35.7)
MCV: 84 fL (ref 79–97)
Monocytes Absolute: 0.5 10*3/uL (ref 0.1–0.9)
Monocytes: 6 %
Neutrophils Absolute: 2.9 10*3/uL (ref 1.4–7.0)
Neutrophils: 37 %
Platelets: 707 10*3/uL — ABNORMAL HIGH (ref 150–450)
RBC: 4 x10E6/uL (ref 3.77–5.28)
RDW: 17.1 % — ABNORMAL HIGH (ref 11.7–15.4)
WBC: 7.8 10*3/uL (ref 3.4–10.8)

## 2023-07-13 LAB — LIPID PANEL
Chol/HDL Ratio: 2.8 ratio (ref 0.0–4.4)
Cholesterol, Total: 186 mg/dL (ref 100–199)
HDL: 67 mg/dL (ref >39)
LDL Chol Calc (NIH): 87 mg/dL (ref 0–99)
Triglycerides: 189 mg/dL — ABNORMAL HIGH (ref 0–149)
VLDL Cholesterol Cal: 32 mg/dL (ref 5–40)

## 2023-07-13 LAB — PARATHYROID HORMONE, INTACT (NO CA): PTH: 28 pg/mL (ref 15–65)

## 2023-07-13 LAB — TSH: TSH: 0.799 u[IU]/mL (ref 0.450–4.500)

## 2023-07-13 LAB — FOLATE: Folate: >20 ng/mL (ref >3.0)

## 2023-07-13 LAB — IRON: Iron: 51 ug/dL (ref 27–159)

## 2023-07-13 LAB — VITAMIN D 25 HYDROXY (VIT D DEFICIENCY, FRACTURES): Vit D, 25-Hydroxy: 39.6 ng/mL (ref 30.0–100.0)

## 2023-07-13 LAB — VITAMIN B12: Vitamin B-12: 798 pg/mL (ref 232–1245)

## 2023-07-13 LAB — MAGNESIUM: Magnesium: 1.6 mg/dL (ref 1.6–2.3)

## 2023-07-13 LAB — FERRITIN: Ferritin: 138 ng/mL (ref 15–150)

## 2023-07-13 NOTE — Assessment & Plan Note (Signed)
 Check calcium and PTH levels , both normal

## 2023-07-13 NOTE — Progress Notes (Signed)
 [name]     MRN: [id number]      DOB: [birth year]  Chief Complaint  Patient presents with   Medical Management of Chronic Issues    4 month follow up. Follow up since surgery on 04/17     HPI [name] is here for follow up and re-evaluation of chronic medical conditions, medication management and review of any available recent lab and radiology data.  Preventive health is updated, specifically  Cancer screening and Immunization.   Is doing very well post surgery f or closure of ileostomy, tolerating regular diet, no probs with abd distension or difficulty with BM Mental health needs are great, coming up to 1 year anniversary when son committed suicide, needs a new therapist per recommendation of her current one and this needs to be fol,lowed up on, not suicidal or homicidal The PT denies any adverse reactions to current medications since the last visit.  Recent lab from hospitalization reveals significant anemia which needs to be updated, has been having iron  infusions Will have MRI lumbar spine and evaluation by Specialist following this, currently on no pain meds    ROS Denies recent fever or chills. Denies sinus pressure, nasal congestion, ear pain or sore throat. Denies chest congestion, productive cough or wheezing. Denies chest pains, palpitations and leg swelling Denies abdominal pain, nausea, vomiting,diarrhea or constipation.   Denies dysuria, frequency, hesitancy or incontinence. Denies joint pain, swelling and limitation in mobility. Denies headaches, seizures, numbness, or tingling. . Surgiclal wound healing well was seen by Surgeon earlier today   PE  BP 120/82   Pulse 75   Resp 18   Ht 5' 7.5" (1.715 m)   Wt 188 lb 1.9 oz (85.3 kg)   SpO2 97%   BMI 29.03 kg/m   Patient alert and oriented and in no cardiopulmonary distress.  HEENT: No facial asymmetry, EOMI,     Neck supple .  Chest: Clear to auscultation bilaterally.  CVS: S1, S2 no murmurs, no  S3.Regular rate.  ABD: Soft non tender.   Ext: No edema  MS: Adequate ROM spine, shoulders, hips and knees.  Skin: Intact, no ulcerations or rash noted.  Psych: Good eye contact, normal affect. Memory intact not anxious or depressed appearing.  CNS: CN 2-12 intact, power,  normal throughout.no focal deficits noted.   Assessment & Plan  Iron  deficiency anemia Updated lab needed and shows improvement in past 3 weeks  Essential hypertension Normotensive and on medication DASH diet and commitment to daily physical activity for a minimum of 30 minutes discussed and encouraged, as a part of hypertension management. The importance of attaining a healthy weight is also discussed.     07/11/2023    3:15 PM 07/11/2023    2:42 PM 05/12/2023   12:37 PM 05/12/2023    9:30 AM 05/05/2023    1:09 PM 05/05/2023    9:27 AM 04/28/2023    1:32 PM  BP/Weight  Systolic BP 120 142 131 133 113 124 138  Diastolic BP 82 79 67 75 74 71 83  Wt. (Lbs)  188.12       BMI  29.03 kg/m2            Crohn's disease of both small and large intestine (HCC) Continue med management by GI Marked improvement in symptoms following recent bowel resection  Complicated grief Needs therapy and will work on getting this arranged  Chronic bilateral low back pain with sciatica Upcoming MRI planned and ongoing evaluation by  Specialist  Hypokalemia Rept lab shows resolution  Thrombocytosis (HCC) Increased platelet count , already established with hematology will rept in 4 months  Vitamin D  deficiency disease Corrected , continue current med management  Hypocalcemia Check calcium and PTH levels , both normal

## 2023-07-13 NOTE — Assessment & Plan Note (Signed)
 Normotensive and on medication DASH diet and commitment to daily physical activity for a minimum of 30 minutes discussed and encouraged, as a part of hypertension management. The importance of attaining a healthy weight is also discussed.     07/11/2023    3:15 PM 07/11/2023    2:42 PM 05/12/2023   12:37 PM 05/12/2023    9:30 AM 05/05/2023    1:09 PM 05/05/2023    9:27 AM 04/28/2023    1:32 PM  BP/Weight  Systolic BP 120 142 131 133 113 124 138  Diastolic BP 82 79 67 75 74 71 83  Wt. (Lbs)  188.12       BMI  29.03 kg/m2

## 2023-07-13 NOTE — Assessment & Plan Note (Signed)
 Corrected , continue current med management

## 2023-07-13 NOTE — Assessment & Plan Note (Signed)
 Rept lab shows resolution

## 2023-07-13 NOTE — Assessment & Plan Note (Signed)
 Increased platelet count , already established with hematology will rept in 4 months

## 2023-07-13 NOTE — Assessment & Plan Note (Addendum)
 Updated lab needed and shows improvement in past 3 weeks

## 2023-07-13 NOTE — Assessment & Plan Note (Signed)
 Continue med management by GI Marked improvement in symptoms following recent bowel resection

## 2023-07-13 NOTE — Assessment & Plan Note (Signed)
 Upcoming MRI planned and ongoing evaluation by Specialist

## 2023-07-13 NOTE — Assessment & Plan Note (Signed)
 Needs therapy and will work on getting this arranged

## 2023-07-14 ENCOUNTER — Encounter: Payer: Self-pay | Admitting: Physical Medicine and Rehabilitation

## 2023-07-17 MED ORDER — METHOCARBAMOL 500 MG PO TABS: 500.0000 mg | ORAL_TABLET | Freq: Three times a day (TID) | ORAL | 0 refills | Status: AC | PRN

## 2023-07-20 ENCOUNTER — Ambulatory Visit: Payer: Self-pay | Admitting: Family Medicine

## 2023-08-01 ENCOUNTER — Other Ambulatory Visit: Payer: Self-pay | Admitting: Physical Medicine and Rehabilitation

## 2023-08-01 DIAGNOSIS — M5416 Radiculopathy, lumbar region: Secondary | ICD-10-CM

## 2023-08-01 DIAGNOSIS — G8929 Other chronic pain: Secondary | ICD-10-CM

## 2023-08-08 DIAGNOSIS — K50012 Crohn's disease of small intestine with intestinal obstruction: Secondary | ICD-10-CM | POA: Diagnosis not present

## 2023-08-14 ENCOUNTER — Other Ambulatory Visit

## 2023-08-26 ENCOUNTER — Other Ambulatory Visit: Payer: Self-pay | Admitting: Family Medicine

## 2023-08-28 ENCOUNTER — Encounter: Payer: Self-pay | Admitting: Family Medicine

## 2023-08-29 MED ORDER — TIZANIDINE HCL 2 MG PO CAPS: 2.0000 mg | ORAL_CAPSULE | Freq: Three times a day (TID) | ORAL | 0 refills | Status: DC | PRN

## 2023-08-30 ENCOUNTER — Ambulatory Visit: Payer: Medicare HMO

## 2023-08-30 VITALS — Ht 67.5 in | Wt 189.0 lb

## 2023-08-30 DIAGNOSIS — Z0000 Encounter for general adult medical examination without abnormal findings: Secondary | ICD-10-CM

## 2023-08-30 DIAGNOSIS — Z0100 Encounter for examination of eyes and vision without abnormal findings: Secondary | ICD-10-CM
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
## 2023-08-30 NOTE — Patient Instructions (Signed)
 [name] ,  Thank you for taking time out of your busy schedule to complete your Annual Wellness Visit with me. I enjoyed our conversation and look forward to speaking with you again next year. I, as well as your care team,  appreciate your ongoing commitment to your health goals. Please review the following plan we discussed and let me know if I can assist you in the future.  I enjoyed our conversation and look forward to it again next year. Blessing for the upcoming year!!  -Dakari Stabler  Your Game plan/ To Do List    Referrals Placed:  Eye Doctor: [facility] [address] [place] [address] Ph [phone]   Follow up Visits:  Next appointment with PCP: November 22, 2023 in office  Medicare AWV with health advisor: September 02, 2024 at 1:50 pm video visit    Clinician Recommendations:  Aim for 30 minutes of exercise or brisk walking, 6-8 glasses of water, and 5 servings of fruits and vegetables each day.       This is a list of the screening recommended for you and due dates:  Health Maintenance  Topic Date Due   Hepatitis B Vaccine (1 of 3 - 19+ 3-dose series) Never done   COVID-19 Vaccine (4 - 2024-25 season) 11/06/2022   Flu Shot  10/06/2023   Mammogram  12/28/2023   Medicare Annual Wellness Visit  08/29/2024   Pap with HPV screening  01/01/2026   DTaP/Tdap/Td vaccine (3 - Td or Tdap) 05/12/2031   Colon Cancer Screening  03/09/2033   Hepatitis C Screening  Completed   HIV Screening  Completed   Zoster (Shingles) Vaccine  Completed   Pneumococcal Vaccination  Aged Out   HPV Vaccine  Aged Out   Meningitis B Vaccine  Aged Out    Advanced directives: (Provided) Advance directive discussed with you today. I have provided a copy for you to complete at home and have notarized. Once this is complete, please bring a copy in to our office so we can scan it into your chart.  Advance Care Planning is important because it:  [x]  Makes sure you receive the medical care  that is consistent with your values, goals, and preferences  [x]  It provides guidance to your family and loved ones and reduces their decisional burden about whether or not they are making the right decisions based on your wishes.  Follow the link provided in your after visit summary or read over the paperwork we have mailed to you to help you started getting your Advance Directives in place. If you need assistance in completing these, please reach out to us  so that we can help you!  Understanding Your Risk for Falls Millions of people have serious injuries from falls each year. It is important to understand your risk of falling. Talk with your health care provider about your risk and what you can do to lower it. If you do have a serious fall, make sure to tell your provider. Falling once raises your risk of falling again. How can falls affect me? Serious injuries from falls are common. These include: Broken bones, such as hip fractures. Head injuries, such as traumatic brain injuries (TBI) or concussions. A fear of falling can cause you to avoid activities and stay at home. This can make your muscles weaker and raise your risk for a fall. What can increase my risk? There are a number of risk factors that increase your risk for falling. The more risk  factors you have, the higher your risk of falling. Serious injuries from a fall happen most often to people who are older than 54 years old. Teenagers and young adults ages 3-29 are also at higher risk. Common risk factors include: Weakness in the lower body. Being generally weak or confused due to long-term (chronic) illness. Dizziness or balance problems. Poor vision. Medicines that cause dizziness or drowsiness. These may include: Medicines for your blood pressure, heart, anxiety, insomnia, or swelling (edema). Pain medicines. Muscle relaxants. Other risk factors include: Drinking alcohol. Having had a fall in the past. Having foot pain or  wearing improper footwear. Working at a dangerous job. Having any of the following in your home: Tripping hazards, such as floor clutter or loose rugs. Poor lighting. Pets. Having dementia or memory loss. What actions can I take to lower my risk of falling?     Physical activity Stay physically fit. Do strength and balance exercises. Consider taking a regular class to build strength and balance. Yoga and tai chi are good options. Vision Have your eyes checked every year and your prescription for glasses or contacts updated as needed. Shoes and walking aids Wear non-skid shoes. Wear shoes that have rubber soles and low heels. Do not wear high heels. Do not walk around the house in socks or slippers. Use a cane or walker as told by your provider. Home safety Attach secure railings on both sides of your stairs. Install grab bars for your bathtub, shower, and toilet. Use a non-skid mat in your bathtub or shower. Attach bath mats securely with double-sided, non-slip rug tape. Use good lighting in all rooms. Keep a flashlight near your bed. Make sure there is a clear path from your bed to the bathroom. Use night-lights. Do not use throw rugs. Make sure all carpeting is taped or tacked down securely. Remove all clutter from walkways and stairways, including extension cords. Repair uneven or broken steps and floors. Avoid walking on icy or slippery surfaces. Walk on the grass instead of on icy or slick sidewalks. Use ice melter to get rid of ice on walkways in the winter. Use a cordless phone. Questions to ask your health care provider Can you help me check my risk for a fall? Do any of my medicines make me more likely to fall? Should I take a vitamin D  supplement? What exercises can I do to improve my strength and balance? Should I make an appointment to have my vision checked? Do I need a bone density test to check for weak bones (osteoporosis)? Would it help to use a cane or a  walker? Where to find more information Centers for Disease Control and Prevention, STEADI: TonerPromos.no Community-Based Fall Prevention Programs: TonerPromos.no [employer] on Aging: BaseRingTones.pl Contact a health care provider if: You fall at home. You are afraid of falling at home. You feel weak, drowsy, or dizzy. This information is not intended to replace advice given to you by your health care provider. Make sure you discuss any questions you have with your health care provider. Document Revised: 10/25/2021 Document Reviewed: 10/25/2021 Elsevier Patient Education  2024 [employer].

## 2023-09-04 ENCOUNTER — Ambulatory Visit (INDEPENDENT_AMBULATORY_CARE_PROVIDER_SITE_OTHER): Admitting: Rehabilitative and Restorative Service Providers"

## 2023-09-04 ENCOUNTER — Encounter: Payer: Self-pay | Admitting: Rehabilitative and Restorative Service Providers"

## 2023-09-04 DIAGNOSIS — M5459 Other low back pain: Secondary | ICD-10-CM | POA: Diagnosis not present

## 2023-09-04 DIAGNOSIS — R262 Difficulty in walking, not elsewhere classified: Secondary | ICD-10-CM

## 2023-09-04 DIAGNOSIS — M79605 Pain in left leg: Secondary | ICD-10-CM | POA: Diagnosis not present
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
## 2023-09-11 DIAGNOSIS — K5080 Crohn's disease of both small and large intestine without complications: Secondary | ICD-10-CM | POA: Diagnosis not present

## 2023-09-11 DIAGNOSIS — M129 Arthropathy, unspecified: Secondary | ICD-10-CM | POA: Diagnosis not present

## 2023-09-12 ENCOUNTER — Encounter: Payer: Self-pay | Admitting: Family Medicine

## 2023-09-22 ENCOUNTER — Encounter: Payer: Self-pay | Admitting: Physical Therapy

## 2023-09-22 ENCOUNTER — Ambulatory Visit: Admitting: Physical Therapy

## 2023-09-22 DIAGNOSIS — R262 Difficulty in walking, not elsewhere classified: Secondary | ICD-10-CM

## 2023-09-22 DIAGNOSIS — M5431 Sciatica, right side: Secondary | ICD-10-CM

## 2023-09-22 DIAGNOSIS — M5459 Other low back pain: Secondary | ICD-10-CM | POA: Diagnosis not present

## 2023-09-22 DIAGNOSIS — M79605 Pain in left leg: Secondary | ICD-10-CM

## 2023-09-22 DIAGNOSIS — M5432 Sciatica, left side: Secondary | ICD-10-CM
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
## 2023-09-27 ENCOUNTER — Encounter: Payer: Self-pay | Admitting: Family Medicine

## 2023-09-28 ENCOUNTER — Encounter: Payer: Self-pay | Admitting: Rehabilitative and Restorative Service Providers"

## 2023-09-28 ENCOUNTER — Ambulatory Visit: Admitting: Rehabilitative and Restorative Service Providers"

## 2023-09-28 DIAGNOSIS — M5459 Other low back pain: Secondary | ICD-10-CM | POA: Diagnosis not present

## 2023-09-28 DIAGNOSIS — M79605 Pain in left leg: Secondary | ICD-10-CM | POA: Diagnosis not present

## 2023-09-28 DIAGNOSIS — R262 Difficulty in walking, not elsewhere classified: Secondary | ICD-10-CM | POA: Diagnosis not present
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
## 2023-10-04 ENCOUNTER — Encounter: Admitting: Rehabilitative and Restorative Service Providers"

## 2023-10-09 ENCOUNTER — Ambulatory Visit (INDEPENDENT_AMBULATORY_CARE_PROVIDER_SITE_OTHER): Admitting: Rehabilitative and Restorative Service Providers"

## 2023-10-09 ENCOUNTER — Encounter: Payer: Self-pay | Admitting: Rehabilitative and Restorative Service Providers"

## 2023-10-09 DIAGNOSIS — M79605 Pain in left leg: Secondary | ICD-10-CM | POA: Diagnosis not present

## 2023-10-09 DIAGNOSIS — R262 Difficulty in walking, not elsewhere classified: Secondary | ICD-10-CM

## 2023-10-09 DIAGNOSIS — M5459 Other low back pain: Secondary | ICD-10-CM
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
## 2023-10-10 ENCOUNTER — Ambulatory Visit (INDEPENDENT_AMBULATORY_CARE_PROVIDER_SITE_OTHER): Admitting: Psychiatry

## 2023-10-10 ENCOUNTER — Encounter (HOSPITAL_COMMUNITY): Payer: Self-pay | Admitting: Psychiatry

## 2023-10-10 DIAGNOSIS — F330 Major depressive disorder, recurrent, mild: Secondary | ICD-10-CM
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
## 2023-10-12 ENCOUNTER — Other Ambulatory Visit: Payer: Self-pay | Admitting: Family Medicine

## 2023-10-12 DIAGNOSIS — D509 Iron deficiency anemia, unspecified: Secondary | ICD-10-CM

## 2023-10-13 ENCOUNTER — Inpatient Hospital Stay: Payer: Medicare HMO | Attending: Hematology

## 2023-10-13 DIAGNOSIS — R5383 Other fatigue: Secondary | ICD-10-CM | POA: Insufficient documentation

## 2023-10-13 DIAGNOSIS — Z87891 Personal history of nicotine dependence: Secondary | ICD-10-CM | POA: Diagnosis not present

## 2023-10-13 DIAGNOSIS — E559 Vitamin D deficiency, unspecified: Secondary | ICD-10-CM | POA: Diagnosis not present

## 2023-10-13 DIAGNOSIS — D75839 Thrombocytosis, unspecified: Secondary | ICD-10-CM | POA: Insufficient documentation

## 2023-10-13 DIAGNOSIS — D508 Other iron deficiency anemias: Secondary | ICD-10-CM

## 2023-10-13 DIAGNOSIS — D509 Iron deficiency anemia, unspecified: Secondary | ICD-10-CM | POA: Diagnosis present

## 2023-10-13 DIAGNOSIS — E538 Deficiency of other specified B group vitamins: Secondary | ICD-10-CM | POA: Diagnosis not present

## 2023-10-13 LAB — FERRITIN: Ferritin: 34 ng/mL (ref 11–307)

## 2023-10-13 LAB — CBC WITH DIFFERENTIAL/PLATELET
Abs Immature Granulocytes: 0.03 K/uL (ref 0.00–0.07)
Basophils Absolute: 0.1 K/uL (ref 0.0–0.1)
Basophils Relative: 1 %
Eosinophils Absolute: 0.4 K/uL (ref 0.0–0.5)
Eosinophils Relative: 5 %
HCT: 37 % (ref 36.0–46.0)
Hemoglobin: 11.7 g/dL — ABNORMAL LOW (ref 12.0–15.0)
Immature Granulocytes: 0 %
Lymphocytes Relative: 44 %
Lymphs Abs: 3.9 K/uL (ref 0.7–4.0)
MCH: 26.5 pg (ref 26.0–34.0)
MCHC: 31.6 g/dL (ref 30.0–36.0)
MCV: 83.9 fL (ref 80.0–100.0)
Monocytes Absolute: 0.5 K/uL (ref 0.1–1.0)
Monocytes Relative: 6 %
Neutro Abs: 3.8 K/uL (ref 1.7–7.7)
Neutrophils Relative %: 44 %
Platelets: 376 K/uL (ref 150–400)
RBC: 4.41 MIL/uL (ref 3.87–5.11)
RDW: 15.5 % (ref 11.5–15.5)
WBC: 8.7 K/uL (ref 4.0–10.5)
nRBC: 0 % (ref 0.0–0.2)

## 2023-10-13 LAB — IRON AND TIBC
Iron: 89 ug/dL (ref 28–170)
Saturation Ratios: 23 % (ref 10.4–31.8)
TIBC: 388 ug/dL (ref 250–450)
UIBC: 299 ug/dL

## 2023-10-13 LAB — FOLATE: Folate: 27.9 ng/mL (ref >5.9)

## 2023-10-13 LAB — VITAMIN B12: Vitamin B-12: 781 pg/mL (ref 180–914)

## 2023-10-13 LAB — VITAMIN D 25 HYDROXY (VIT D DEFICIENCY, FRACTURES): Vit D, 25-Hydroxy: 42.09 ng/mL (ref 30–100)

## 2023-10-16 ENCOUNTER — Encounter: Payer: Self-pay | Admitting: Oncology

## 2023-10-16 ENCOUNTER — Other Ambulatory Visit: Payer: Self-pay | Admitting: *Deleted

## 2023-10-16 MED ORDER — POLYSACCHARIDE IRON COMPLEX 150 MG PO CAPS: 150.0000 mg | ORAL_CAPSULE | Freq: Every day | ORAL | 0 refills | Status: DC

## 2023-10-20 ENCOUNTER — Inpatient Hospital Stay: Payer: Medicare HMO | Admitting: Oncology

## 2023-10-20 DIAGNOSIS — D508 Other iron deficiency anemias: Secondary | ICD-10-CM

## 2023-10-20 DIAGNOSIS — D75839 Thrombocytosis, unspecified: Secondary | ICD-10-CM | POA: Diagnosis not present

## 2023-10-20 DIAGNOSIS — E559 Vitamin D deficiency, unspecified: Secondary | ICD-10-CM | POA: Diagnosis not present

## 2023-10-20 DIAGNOSIS — E538 Deficiency of other specified B group vitamins: Secondary | ICD-10-CM

## 2023-10-20 MED ORDER — VITAMIN D (ERGOCALCIFEROL) 1.25 MG (50000 UNIT) PO CAPS: 50000.0000 [IU] | ORAL_CAPSULE | ORAL | 0 refills | Status: AC

## 2023-10-20 NOTE — Progress Notes (Addendum)
 [facility] OFFICE PROGRESS NOTE  Virtual Visit via Telephone Note  I connected with [name] on 10/20/23 at  2:15 PM EDT by telephone and verified that I am speaking with the correct person using two identifiers.  Location: Patient: Home Provider: Clinic    I discussed the limitations, risks, security and privacy concerns of performing an evaluation and management service by telephone and the availability of in person appointments. I also discussed with the patient that there may be a patient responsible charge related to this service. The patient expressed understanding and agreed to proceed.  ASSESSMENT & PLAN:  Assessment & Plan Other iron  deficiency anemia - Prior history of menorrhagia, but patient is now perimenopausal, with LMP in fall 2023 - She has underlying Crohn's disease, but denies any recent GI blood loss - Colonoscopy (05/10/2021) Proximal colon with suspected surgical changes, small nodule and adjacent small ulcer biopsied, possibly contributing to IDA - pathology showed inflammation, no malignancy. - She has been taking oral iron  supplement (Niferex) at home with improvement in her iron  levels -Patient had ileostomy creation on 03/16/2023 with great tolerance at [facility] and had ileostomy takedown in April 2025.  Both procedures went really well. - Most recent labs from 10/13/23 show ferritin of 34 (138), saturation 23%, TIBC 388 with a hemoglobin 11.7.  Differential is normal. -She last received IV iron  on 04/28/2023, 05/05/2023 and 05/12/2023. -Iron  levels have dropped over the past 3 to 4 months. -Recommend 2 doses of IV Venofer . Vitamin B12 deficiency disease - Vitamin D  level is 42.02. - She is taking Vitamin D  50,000 units weekly   -Continue vitamin D  supplements. Thrombocytosis - Most recent CBC shows improvement of her thrombocytosis.  Platelet count 376. - No recent steroid use or infections.  Former smoker, quit in 2013.  No B symptoms or  unintentional weight loss. - Overall picture favors reactive leukocytosis and thrombocytosis in the setting of Crohn's disease. -Watchful waiting remains appropriate for the time being. Vitamin D  deficiency -Continue vitamin D  supplements once per week. -Vitamin D  level from 10/13/2023 is 42.09.    Orders Placed This Encounter  Procedures   Folate    Standing Status:   Future    Expected Date:   04/09/2024    Expiration Date:   10/20/2024   Methylmalonic acid, serum    Standing Status:   Future    Expected Date:   04/09/2024    Expiration Date:   10/20/2024   Vitamin B12    Standing Status:   Future    Expected Date:   04/09/2024    Expiration Date:   10/20/2024   VITAMIN D  25 Hydroxy (Vit-D Deficiency, Fractures)    Standing Status:   Future    Expected Date:   04/09/2024    Expiration Date:   10/20/2024   Iron  and TIBC    Standing Status:   Future    Expected Date:   04/09/2024    Expiration Date:   10/20/2024    Release to patient:   Immediate [1]   Ferritin    Standing Status:   Future    Expected Date:   04/09/2024    Expiration Date:   10/20/2024    Release to patient:   Immediate [1]   CBC with Differential/Platelet    Standing Status:   Future    Expected Date:   04/09/2024    Expiration Date:   10/20/2024    Release to patient:   Immediate [  1]    INTERVAL HISTORY: Patient returns for recurrent anemia, thrombocytosis, vitamin B12 and Crohn's disease.  Reports she has done well since her last visit with us .  She did have her ileostomy reversed in April 2025.  Reports she did very well with that.  She is also receiving physical therapy for her back.   Energy levels are very low.  It is 100%.  She has some trouble falling and staying asleep which is fairly chronic for her.  Denies any new concerns.  We reviewed most recent lab work folate, MMA, B12, vitamin D , CBC with differential.  SUMMARY OF HEMATOLOGIC HISTORY: Patient is a 53 year old female past medical history significant  for Crohn's disease, iron  deficiency and vitamin D  deficiency.  She had ileostomy creation on 03/16/2023 with great tolerance at [facility] and takedown in April 2025.  Both procedures went really well.  She is currently on Stelara  to help with Crohn's flares.  Review of Systems  Constitutional:  Positive for malaise/fatigue.  Gastrointestinal:  Positive for diarrhea and vomiting.  Musculoskeletal:  Positive for back pain.  Neurological:  Positive for sensory change and weakness.  Psychiatric/Behavioral:  The patient is nervous/anxious and has insomnia.    Physical Exam Neurological:     Mental Status: She is alert and oriented to person, place, and time.     No results found for: CBC  There were no vitals filed for this visit.  I provided 14 minutes of non face-to-face telephone visit time during this encounter, and > 50% was spent counseling as documented under my assessment & plan. ,  Delon Hope, NP 10/20/2023 2:26 PM

## 2023-10-20 NOTE — Assessment & Plan Note (Addendum)
-   Vitamin D  level is 42.02. - She is taking Vitamin D  50,000 units weekly   -Continue vitamin D  supplements.

## 2023-10-20 NOTE — Assessment & Plan Note (Addendum)
-   Most recent CBC shows improvement of her thrombocytosis.  Platelet count 376. - No recent steroid use or infections.  Former smoker, quit in 2013.  No B symptoms or unintentional weight loss. - Overall picture favors reactive leukocytosis and thrombocytosis in the setting of Crohn's disease. -Watchful waiting remains appropriate for the time being.

## 2023-10-20 NOTE — Assessment & Plan Note (Addendum)
-   Prior history of menorrhagia, but patient is now perimenopausal, with LMP in fall 2023 - She has underlying Crohn's disease, but denies any recent GI blood loss - Colonoscopy (05/10/2021) Proximal colon with suspected surgical changes, small nodule and adjacent small ulcer biopsied, possibly contributing to IDA - pathology showed inflammation, no malignancy. - She has been taking oral iron  supplement (Niferex) at home with improvement in her iron  levels -Patient had ileostomy creation on 03/16/2023 with great tolerance at [facility] and had ileostomy takedown in April 2025.  Both procedures went really well. - Most recent labs from 10/13/23 show ferritin of 34 (138), saturation 23%, TIBC 388 with a hemoglobin 11.7.  Differential is normal. -She last received IV iron  on 04/28/2023, 05/05/2023 and 05/12/2023. -Iron  levels have dropped over the past 3 to 4 months. -Recommend 2 doses of IV Venofer .

## 2023-10-21 LAB — METHYLMALONIC ACID, SERUM: Methylmalonic Acid, Quantitative: 143 nmol/L (ref 0–378)

## 2023-10-24 ENCOUNTER — Encounter: Payer: Self-pay | Admitting: Rehabilitative and Restorative Service Providers"

## 2023-10-24 ENCOUNTER — Other Ambulatory Visit: Payer: Self-pay | Admitting: Physical Medicine and Rehabilitation

## 2023-10-24 ENCOUNTER — Ambulatory Visit (INDEPENDENT_AMBULATORY_CARE_PROVIDER_SITE_OTHER): Admitting: Rehabilitative and Restorative Service Providers"

## 2023-10-24 ENCOUNTER — Telehealth: Payer: Self-pay | Admitting: Physical Medicine and Rehabilitation

## 2023-10-24 DIAGNOSIS — R262 Difficulty in walking, not elsewhere classified: Secondary | ICD-10-CM

## 2023-10-24 DIAGNOSIS — M5459 Other low back pain: Secondary | ICD-10-CM

## 2023-10-24 DIAGNOSIS — M5416 Radiculopathy, lumbar region: Secondary | ICD-10-CM

## 2023-10-24 DIAGNOSIS — M79605 Pain in left leg: Secondary | ICD-10-CM

## 2023-10-24 NOTE — Telephone Encounter (Signed)
 Pt came in office asking for MRI referral to be sent ASAP. Please call pt about this matter at [phone]. Pt states she has some infusion injections coming and trying to get MRI before that time
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
## 2023-10-27 ENCOUNTER — Encounter: Payer: Self-pay | Admitting: Physical Medicine and Rehabilitation

## 2023-10-27 ENCOUNTER — Encounter: Payer: Self-pay | Admitting: Family Medicine

## 2023-10-28 ENCOUNTER — Other Ambulatory Visit

## 2023-10-29 ENCOUNTER — Emergency Department (HOSPITAL_COMMUNITY)
Admission: EM | Admit: 2023-10-29 | Discharge: 2023-10-29 | Disposition: A | Discharge status: Home / Self Care | Attending: Student | Admitting: Student

## 2023-10-29 ENCOUNTER — Emergency Department (HOSPITAL_COMMUNITY)

## 2023-10-29 ENCOUNTER — Encounter (HOSPITAL_COMMUNITY): Payer: Self-pay | Admitting: Emergency Medicine

## 2023-10-29 ENCOUNTER — Other Ambulatory Visit: Payer: Self-pay

## 2023-10-29 DIAGNOSIS — M5441 Lumbago with sciatica, right side: Secondary | ICD-10-CM | POA: Insufficient documentation

## 2023-10-29 DIAGNOSIS — M5440 Lumbago with sciatica, unspecified side: Secondary | ICD-10-CM

## 2023-10-29 DIAGNOSIS — M545 Low back pain, unspecified: Secondary | ICD-10-CM | POA: Diagnosis present

## 2023-10-29 DIAGNOSIS — J45909 Unspecified asthma, uncomplicated: Secondary | ICD-10-CM | POA: Diagnosis not present

## 2023-10-29 DIAGNOSIS — Z91010 Allergy to peanuts: Secondary | ICD-10-CM | POA: Diagnosis not present

## 2023-10-29 DIAGNOSIS — Z87891 Personal history of nicotine dependence: Secondary | ICD-10-CM | POA: Diagnosis not present

## 2023-10-29 DIAGNOSIS — M5442 Lumbago with sciatica, left side: Secondary | ICD-10-CM | POA: Insufficient documentation

## 2023-10-29 MED ORDER — LIDOCAINE 5 % EX PTCH: 1.0000 | MEDICATED_PATCH | CUTANEOUS | 0 refills | Status: DC

## 2023-10-29 MED ORDER — OXYCODONE HCL 5 MG PO TABS
5.0000 mg | ORAL_TABLET | Freq: Four times a day (QID) | ORAL | 0 refills | Status: DC | PRN
Start: 2023-10-29 — End: 2023-11-13

## 2023-10-29 MED ORDER — GABAPENTIN 300 MG PO CAPS: 300.0000 mg | ORAL_CAPSULE | Freq: Three times a day (TID) | ORAL | 0 refills | Status: AC | PRN

## 2023-10-29 MED ORDER — LIDOCAINE 5 % EX PTCH
1.0000 | MEDICATED_PATCH | CUTANEOUS | Status: DC
  Administered 2023-10-29: 1 via TRANSDERMAL
  Filled 2023-10-29: qty 1

## 2023-10-29 MED ORDER — GABAPENTIN 100 MG PO CAPS
200.0000 mg | ORAL_CAPSULE | Freq: Once | ORAL | Status: AC
  Administered 2023-10-29: 200 mg via ORAL
  Filled 2023-10-29: qty 2

## 2023-10-29 NOTE — Discharge Instructions (Addendum)
 For pain:  - Acetaminophen  1000 mg three times daily (every 8 hours) - Gabapentin  3 times daily as needed (every 8 hours) - oxycodone  for breakthrough pain only - Lidoderm  patches twice daily

## 2023-10-29 NOTE — ED Triage Notes (Signed)
Pt c/o lower back pain x 2 weeks 
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
## 2023-10-30 ENCOUNTER — Inpatient Hospital Stay

## 2023-10-30 VITALS — BP 131/66 | HR 76 | Temp 96.7°F | Resp 18

## 2023-10-30 DIAGNOSIS — D500 Iron deficiency anemia secondary to blood loss (chronic): Secondary | ICD-10-CM

## 2023-10-30 DIAGNOSIS — D509 Iron deficiency anemia, unspecified: Secondary | ICD-10-CM | POA: Diagnosis not present

## 2023-10-30 MED ORDER — CETIRIZINE HCL 10 MG PO TABS
10.0000 mg | ORAL_TABLET | Freq: Once | ORAL | Status: AC
Start: 2023-10-30 — End: 2023-10-30
  Administered 2023-10-30: 10 mg via ORAL
  Filled 2023-10-30: qty 1

## 2023-10-30 MED ORDER — FAMOTIDINE IN NACL 20-0.9 MG/50ML-% IV SOLN
20.0000 mg | Freq: Once | INTRAVENOUS | Status: AC
  Administered 2023-10-30: 20 mg via INTRAVENOUS
  Filled 2023-10-30: qty 50

## 2023-10-30 MED ORDER — IRON SUCROSE 300 MG IVPB - SIMPLE MED
300.0000 mg | Freq: Once | Status: AC
  Administered 2023-10-30: 300 mg via INTRAVENOUS
  Filled 2023-10-30: qty 200

## 2023-10-30 MED ORDER — SODIUM CHLORIDE 0.9 % IV SOLN: INTRAVENOUS | Status: DC

## 2023-10-30 NOTE — Patient Instructions (Signed)
 [facility] [place] - A DEPT OF [facility]  Discharge Instructions: Thank you for choosing [facility] to provide your oncology and hematology care.  If you have a lab appointment with the Cancer Center - please note that after April 8th, 2024, all labs will be drawn in the cancer center.  You do not have to check in or register with the main entrance as you have in the past but will complete your check-in in the cancer center.  Wear comfortable clothing and clothing appropriate for easy access to any Portacath or PICC line.   We strive to give you quality time with your provider. You may need to reschedule your appointment if you arrive late (15 or more minutes).  Arriving late affects you and other patients whose appointments are after yours.  Also, if you miss three or more appointments without notifying the office, you may be dismissed from the clinic at the provider's discretion.      For prescription refill requests, have your pharmacy contact our office and allow 72 hours for refills to be completed.    Today you received Venofer  300 mg IV infusion.   BELOW ARE SYMPTOMS THAT SHOULD BE REPORTED IMMEDIATELY: *FEVER GREATER THAN 100.4 F (38 C) OR HIGHER *CHILLS OR SWEATING *NAUSEA AND VOMITING THAT IS NOT CONTROLLED WITH YOUR NAUSEA MEDICATION *UNUSUAL SHORTNESS OF BREATH *UNUSUAL BRUISING OR BLEEDING *URINARY PROBLEMS (pain or burning when urinating, or frequent urination) *BOWEL PROBLEMS (unusual diarrhea, constipation, pain near the anus) TENDERNESS IN MOUTH AND THROAT WITH OR WITHOUT PRESENCE OF ULCERS (sore throat, sores in mouth, or a toothache) UNUSUAL RASH, SWELLING OR PAIN  UNUSUAL VAGINAL DISCHARGE OR ITCHING   Items with * indicate a potential emergency and should be followed up as soon as possible or go to the Emergency Department if any problems should occur.  Please show the CHEMOTHERAPY ALERT CARD or IMMUNOTHERAPY ALERT CARD at  check-in to the Emergency Department and triage nurse.  Should you have questions after your visit or need to cancel or reschedule your appointment, please contact [facility] [place] - A DEPT OF [facility] [phone]  and follow the prompts.  Office hours are 8:00 a.m. to 4:30 p.m. Monday - Friday. Please note that voicemails left after 4:00 p.m. may not be returned until the following business day.  We are closed weekends and major holidays. You have access to a nurse at all times for urgent questions. Please call the main number to the clinic [phone] and follow the prompts.  For any non-urgent questions, you may also contact your provider using MyChart. We now offer e-Visits for anyone 63 and older to request care online for non-urgent symptoms. For details visit [email or web address].   Also download the MyChart app! Go to the app store, search MyChart, open the app, select [place], and log in with your MyChart username and password.

## 2023-10-30 NOTE — Progress Notes (Signed)
   10/30/23 1000  Spiritual Encounters  Type of Visit Initial  Care provided to: Patient  Referral source Chaplain assessment  Reason for visit  (Introduction to Spiritual Care)  OnCall Visit No  Spiritual Framework  Presenting Themes Significant life change;Coping tools;Caregiving needs;Courage hope and growth  Community/Connection Family;Friend(s);Faith community  Patient Stress Factors Other (Comment) (Loss of Son, 07/2022)  Family Stress Factors Not reviewed  Interventions  Spiritual Care Interventions Made Established relationship of care and support;Compassionate presence;Reflective listening;Narrative/life review;Supported grief process;Prayer  Intervention Outcomes  Outcomes Connection to spiritual care;Awareness of support  Spiritual Care Plan  Spiritual Care Issues Still Outstanding Chaplain will continue to follow   Reason for Visit: Chaplain identified Pt on the schedule as a Pt I had not connected with yet and visited to deliver introduction to Spiritual Care  Description of Visit: I found [name] sitting in the recliner receiving her treatment, and there was no support person present.  I introduced myself as the chaplain for the cancer center and offered a brief education on the role of a chaplain and the support we can offer to our patients, caregivers, and staff.    [name] was willing to speak with me and she immediately began to share the difficulty of the last year+ in her life.  She shared that she lost her son ([name]) on May of 2024 and that she was still struggling to recover from that grief.  [name] also mentioned that her physical health has declined in the last year, and she attributes her decline to her grief.  [name] went on to share and talked about how this loss has impacted the way she relates to the world around her in that it has created a new compassion and empathy that didn't exist before.  She feels that she understands those who have suffered loss and she  advocates for them before others.  [name] expressed a strong Christian faith and asked me to pray for her as we concluded.  Plan of Care: [name] is not a cancer Pt but does receive monthly iron  infusions.  I will plan to follow-up with her at these monthly appointments.   [name], MDiv  Chaplain, [facility] [email or web address] [phone]

## 2023-10-30 NOTE — Progress Notes (Signed)
 Patient presents today for iron  infusion.  Patient is in satisfactory condition with no new complaints voiced.  Vital signs are stable.  We will proceed with infusion per provider orders.    Peripheral IV started with good blood return pre and post infusion. Patient took Tylenol  at home prior to arrival.  Venofer  300 mg  given today per MD orders. Tolerated infusion without adverse affects. Vital signs stable. No complaints at this time. Discharged from clinic ambulatory in stable condition. Alert and oriented x 3. F/U with [facility] as scheduled.

## 2023-11-07 ENCOUNTER — Encounter: Payer: Self-pay | Admitting: Physical Medicine and Rehabilitation

## 2023-11-07 ENCOUNTER — Inpatient Hospital Stay: Attending: Hematology

## 2023-11-07 VITALS — BP 124/81 | HR 72 | Temp 97.5°F | Resp 18

## 2023-11-07 DIAGNOSIS — Z87891 Personal history of nicotine dependence: Secondary | ICD-10-CM | POA: Insufficient documentation

## 2023-11-07 DIAGNOSIS — D75839 Thrombocytosis, unspecified: Secondary | ICD-10-CM | POA: Insufficient documentation

## 2023-11-07 DIAGNOSIS — E538 Deficiency of other specified B group vitamins: Secondary | ICD-10-CM | POA: Insufficient documentation

## 2023-11-07 DIAGNOSIS — R5383 Other fatigue: Secondary | ICD-10-CM | POA: Insufficient documentation

## 2023-11-07 DIAGNOSIS — D509 Iron deficiency anemia, unspecified: Secondary | ICD-10-CM | POA: Diagnosis present

## 2023-11-07 DIAGNOSIS — D500 Iron deficiency anemia secondary to blood loss (chronic): Secondary | ICD-10-CM

## 2023-11-07 DIAGNOSIS — E559 Vitamin D deficiency, unspecified: Secondary | ICD-10-CM | POA: Insufficient documentation

## 2023-11-07 MED ORDER — SODIUM CHLORIDE 0.9 % IV SOLN: INTRAVENOUS | Status: DC

## 2023-11-07 MED ORDER — FAMOTIDINE IN NACL 20-0.9 MG/50ML-% IV SOLN
20.0000 mg | Freq: Once | INTRAVENOUS | Status: AC
  Administered 2023-11-07: 20 mg via INTRAVENOUS
  Filled 2023-11-07: qty 50

## 2023-11-07 MED ORDER — CETIRIZINE HCL 10 MG PO TABS
10.0000 mg | ORAL_TABLET | Freq: Once | ORAL | Status: AC
  Administered 2023-11-07: 10 mg via ORAL
  Filled 2023-11-07: qty 1

## 2023-11-07 MED ORDER — IRON SUCROSE 300 MG IVPB - SIMPLE MED
300.0000 mg | Freq: Once | Status: AC
  Administered 2023-11-07: 300 mg via INTRAVENOUS
  Filled 2023-11-07: qty 300

## 2023-11-07 MED ORDER — ACETAMINOPHEN 325 MG PO TABS
650.0000 mg | ORAL_TABLET | Freq: Once | ORAL | Status: AC
  Administered 2023-11-07: 650 mg via ORAL
  Filled 2023-11-07: qty 2

## 2023-11-07 NOTE — Patient Instructions (Signed)

## 2023-11-07 NOTE — Progress Notes (Signed)
   11/07/23 1100  Spiritual Encounters  Type of Visit Follow up  Care provided to: Patient  Referral source Chaplain assessment  Reason for visit Routine spiritual support  Spiritual Framework  Presenting Themes Impactful experiences and emotions;Courage hope and growth;Community and relationships  Community/Connection Family (Comminuty/Work Connections)  Patient Stress Factors None identified  Interventions  Spiritual Care Interventions Made Compassionate presence;Reflective listening;Encouragement;Prayer  Intervention Outcomes  Outcomes Awareness of support;Reduced isolation  Spiritual Care Plan  Spiritual Care Issues Still Outstanding Chaplain will continue to follow    Reason for Visit: Chaplain making scheduled follow-up with Pt I previously connected with.  Description of Visit: I found [name] in the infusion room receiving her treatment and she was pleased to talk with me.  It is a good day for [name] and she shares with me how encouraged she has been lately with her connections and her opportunities to serve people at her job.  She works for [employer] in [place] and was partially encouraged by an recent interaction with an elderly gentleman she helped make a salad for.  The salad was for his wife, and he was very pleased with her help.  [name] felt empowered and used by God in this interaction.  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  I told [name] how encouraged I was to see her so joyful today, and we prayed together.  Plan of Care: I will follow up with Pt as her next visit.   Maude Roll, MDiv  Chaplain, [facility] [email or web address] [phone]

## 2023-11-07 NOTE — Progress Notes (Signed)
 Patient tolerated iron  infusion with no complaints voiced.  Peripheral IV site clean and dry with good blood return noted before and after infusion.  Pt observed for 30 minutes post iron  infusion without any complications. VSS with discharge and left in satisfactory condition with no s/s of distress noted. All follow ups as scheduled.  Adamariz Gillott

## 2023-11-10 ENCOUNTER — Ambulatory Visit (HOSPITAL_COMMUNITY): Admitting: Psychiatry

## 2023-11-10 ENCOUNTER — Ambulatory Visit
Admission: RE | Admit: 2023-11-10 | Discharge: 2023-11-10 | Disposition: A | Discharge status: Home / Self Care | Source: Ambulatory Visit | Attending: Physical Medicine and Rehabilitation | Admitting: Physical Medicine and Rehabilitation

## 2023-11-10 DIAGNOSIS — M4316 Spondylolisthesis, lumbar region: Secondary | ICD-10-CM | POA: Diagnosis not present

## 2023-11-10 DIAGNOSIS — F330 Major depressive disorder, recurrent, mild: Secondary | ICD-10-CM

## 2023-11-10 DIAGNOSIS — M48061 Spinal stenosis, lumbar region without neurogenic claudication: Secondary | ICD-10-CM | POA: Diagnosis not present

## 2023-11-10 DIAGNOSIS — M5416 Radiculopathy, lumbar region: Secondary | ICD-10-CM

## 2023-11-10 DIAGNOSIS — M5126 Other intervertebral disc displacement, lumbar region: Secondary | ICD-10-CM | POA: Diagnosis not present

## 2023-11-10 NOTE — Progress Notes (Signed)
 Virtual Visit via Video Note  I connected with [name] on 11/10/23 at 11:00 AM EDT by a video enabled telemedicine application and verified that I am speaking with the correct person using two identifiers.  Location: Patient: Home Provider: Home Office   I discussed the limitations of evaluation and management by telemedicine and the availability of in person appointments. The patient expressed understanding and agreed to proceed.  I provided 60 minutes of non-face-to-face time during this encounter.   Michele FORBES Rubinstein, LCSW    THERAPIST PROGRESS NOTE  Session Time: Friday 11/10/2023 11:05 AM 12:05 AM  Participation Level: Active  Behavioral Response: CasualAlertDepressed/tearful  Type of Therapy: Individual Therapy  Treatment Goals addressed: Establish therapeutic alliance, provide psychoeducation on grief  ProgressTowards Goals: Formal treatment plan will be developed at next session  Interventions: Supportive/grief therapy  Summary: [name] is a 53 y.o. female who is referred for services by PCP Dr. Rollene Pesa. Pt reports two psychiatric hosptializations at [facility] in [place] about 20 years ago. Pt participated in counseling with Redell Corn, LCSW and last was seen in April 2025.  Patient reports her 78 year old son died in 05/31/24due to suicide, drowning, and behavioral health issues.  He had schizophrenia.  Patient states feeling stuck and not knowing any strategies to bring self out when she thinks about her son.  Patient reports irritability, anxiety, difficulty falling and staying asleep, ruminating thoughts, fatigue, tearfulness, difficulty concentrating, and muscle tension.  Patient last was seen about 4 weeks ago for the assessment appointment.  She reports little to no change in symptoms.  She states still feeling stuck.  Patient reports experiencing severe muscle tension and tightness on a daily basis.  She continues to work but reports little to no  involvement in any other activities.  Prior to her son's death, patient reports working out at the gym daily, going to the park and visiting friends as well as family.  Patient reports experiencing loneliness and being overlooked in her grief in certain situations and with certain people.  She reports strong support and understanding from her work family.  She also reports her husband is supportive but states he cannot understand her perspective as a mother.  Patient expresses guilt and blame for her son's death.  She verbalizes thoughts of if she had been home  instead of where she was at the time of his death, she would have had at least 1 more day with son.  She expresses frustration and anger as she says she has no closure.  She has questions of why? she verbalizes to God.  She also has questions about what happened to her son during his final moments before his death.    Suicidal/Homicidal: Nowithout intent/plan  Therapist Response: Reviewed symptoms, administered PHQ 2 and 9 and the GAD-7, discussed results, gathered more information from patient regarding her current functioning and the effects of her son's death on her physically, behaviorally, and cognitively, assisted patient identify members of her support system, facilitated patient expressing thoughts and feelings regarding her son's death, began to explore secondary losses, validated and normalized feelings related to grief and loss, began to provide psychoeducation on grief and loss regarding common reactions to grief, provided patient with handout and developed plan with patient to review in preparation for next session, discussed rationale for and developed plan with patient to complete therapy goals worksheet in preparation for next session  Plan: Return again in 2 weeks.  Diagnosis: Mild episode of  recurrent major depressive disorder                      Collaboration of Care: Primary Care Provider AEB patient sees PCP Dr. Rollene Pesa  Patient/Guardian was advised Release of Information must be obtained prior to any record release in order to collaborate their care with an outside provider. Patient/Guardian was advised if they have not already done so to contact the registration department to sign all necessary forms in order for us  to release information regarding their care.   Consent: Patient/Guardian gives verbal consent for treatment and assignment of benefits for services provided during this visit. Patient/Guardian expressed understanding and agreed to proceed.   Michele FORBES Rubinstein, LCSW 11/10/2023

## 2023-11-13 ENCOUNTER — Encounter (HOSPITAL_COMMUNITY): Payer: Self-pay | Admitting: Registered Nurse

## 2023-11-13 ENCOUNTER — Ambulatory Visit (INDEPENDENT_AMBULATORY_CARE_PROVIDER_SITE_OTHER): Admitting: Registered Nurse

## 2023-11-13 VITALS — BP 119/76 | HR 74 | Ht 67.5 in | Wt 203.8 lb

## 2023-11-13 DIAGNOSIS — G4700 Insomnia, unspecified: Secondary | ICD-10-CM | POA: Diagnosis not present

## 2023-11-13 DIAGNOSIS — F332 Major depressive disorder, recurrent severe without psychotic features: Secondary | ICD-10-CM

## 2023-11-13 DIAGNOSIS — F4381 Prolonged grief disorder: Secondary | ICD-10-CM | POA: Diagnosis not present

## 2023-11-13 DIAGNOSIS — F411 Generalized anxiety disorder: Secondary | ICD-10-CM | POA: Diagnosis not present

## 2023-11-13 MED ORDER — BUSPIRONE HCL 5 MG PO TABS: 5.0000 mg | ORAL_TABLET | Freq: Two times a day (BID) | ORAL | 21 refills | Status: DC

## 2023-11-13 MED ORDER — HYDROXYZINE PAMOATE 25 MG PO CAPS: 25.0000 mg | ORAL_CAPSULE | Freq: Three times a day (TID) | ORAL | 1 refills | Status: DC | PRN

## 2023-11-13 MED ORDER — MIRTAZAPINE 7.5 MG PO TABS: 7.5000 mg | ORAL_TABLET | Freq: Every day | ORAL | 1 refills | Status: DC

## 2023-11-13 MED ORDER — VENLAFAXINE HCL ER 75 MG PO CP24: 75.0000 mg | ORAL_CAPSULE | Freq: Every day | ORAL | 1 refills | Status: DC
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
## 2023-11-13 NOTE — Patient Instructions (Addendum)
 You have been started on more than one new medication. Please do not take them at the same time initially. If an adverse reaction occurs, this will help identify which medication is responsible.  Call 911, [phone], mobile crisis, or present to the nearest emergency room should you experience any suicidal/homicidal ideation, auditory/visual/hallucinations, or detrimental worsening of your mental health.  Mobile Crisis Response Teams Listed by counties in vicinity of [facility] providers [facility] [phone] [facility] [phone] [facility] [phone] [facility] [phone] [place]                * [facility] [phone]                * [facility] [phone]  [facility] [phone] [facility] [employer]  [phone] * [facility] [phone]

## 2023-11-22 ENCOUNTER — Ambulatory Visit: Admitting: Family Medicine

## 2023-11-27 ENCOUNTER — Ambulatory Visit (INDEPENDENT_AMBULATORY_CARE_PROVIDER_SITE_OTHER): Admitting: Physical Medicine and Rehabilitation

## 2023-11-27 ENCOUNTER — Encounter: Payer: Self-pay | Admitting: Physical Medicine and Rehabilitation

## 2023-11-27 DIAGNOSIS — M5441 Lumbago with sciatica, right side: Secondary | ICD-10-CM | POA: Diagnosis not present

## 2023-11-27 DIAGNOSIS — M48061 Spinal stenosis, lumbar region without neurogenic claudication: Secondary | ICD-10-CM

## 2023-11-27 DIAGNOSIS — M5442 Lumbago with sciatica, left side: Secondary | ICD-10-CM

## 2023-11-27 DIAGNOSIS — M5416 Radiculopathy, lumbar region: Secondary | ICD-10-CM | POA: Diagnosis not present

## 2023-11-27 DIAGNOSIS — G8929 Other chronic pain: Secondary | ICD-10-CM

## 2023-11-27 MED ORDER — DIAZEPAM 5 MG PO TABS: ORAL_TABLET | ORAL | 0 refills | Status: DC
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
## 2023-11-27 NOTE — Progress Notes (Unsigned)
 Pain Scale   Average Pain 7 Patient advising that her lower back pain is constant without relief. Patient here for MRI review        +Driver, -BT, -Dye Allergies.

## 2023-11-28 ENCOUNTER — Other Ambulatory Visit: Payer: Self-pay | Admitting: Physical Medicine and Rehabilitation

## 2023-11-28 ENCOUNTER — Encounter: Payer: Self-pay | Admitting: Physical Medicine and Rehabilitation

## 2023-11-28 MED ORDER — TRAMADOL HCL 50 MG PO TABS: 50.0000 mg | ORAL_TABLET | Freq: Three times a day (TID) | ORAL | 0 refills | Status: DC | PRN

## 2023-12-01 ENCOUNTER — Encounter: Payer: Self-pay | Admitting: Physical Medicine and Rehabilitation

## 2023-12-11 ENCOUNTER — Telehealth (HOSPITAL_COMMUNITY): Admitting: Registered Nurse

## 2023-12-18 ENCOUNTER — Encounter (HOSPITAL_COMMUNITY): Payer: Self-pay

## 2023-12-18 ENCOUNTER — Ambulatory Visit (HOSPITAL_COMMUNITY): Admitting: Psychiatry

## 2023-12-18 DIAGNOSIS — F330 Major depressive disorder, recurrent, mild: Secondary | ICD-10-CM | POA: Diagnosis not present

## 2023-12-18 NOTE — Progress Notes (Signed)
 Virtual Visit via Video Note  I connected with [name] on 12/18/23 at 11:07 AM EDT by a video enabled telemedicine application and verified that I am speaking with the correct person using two identifiers.  Location: Patient: Home Provider: Home Office   I discussed the limitations of evaluation and management by telemedicine and the availability of in person appointments. The patient expressed understanding and agreed to proceed.  I provided 58 minutes of non-face-to-face time during this encounter.   Winton FORBES Rubinstein, LCSW    THERAPIST PROGRESS NOTE  Session Time: Monday 12/18/2023 11:07 AM 12:05 AM  Participation Level: Active  Behavioral Response: CasualAlertDepressed/tearful  Type of Therapy: Individual Therapy  Treatment Goals addressed: Pt will reengage in previously enjoyed activities AEB participating in exercise 4 x week for 30 days /Improve self care AEB pt implementing a night time ritual/ Identify and verbalize feelings associated with grief and loss   ProgressTowards Goals:Initial Interventions: Supportive/grief therapy  Summary: [name] is a 53 y.o. female who is referred for services by PCP Dr. Rollene Pesa. Pt reports two psychiatric hosptializations at [facility] in [place] about 20 years ago. Pt participated in counseling with Redell Corn, LCSW and last was seen in April 2025.  Patient reports her 16 year old son died in 03-Jun-2024due to suicide, drowning, and behavioral health issues.  He had schizophrenia.  Patient states feeling stuck and not knowing any strategies to bring self out when she thinks about her son.  Patient reports irritability, anxiety, difficulty falling and staying asleep, ruminating thoughts, fatigue, tearfulness, difficulty concentrating, and muscle tension.  Patient last was seen about 4 weeks ago.  Patient reports continued grief and loss issues related to the death of her son last year.  She has seen NP Shuvon Rankin for  medication evaluation and reports being a little less agitated since taking increased dosage of one of the medications.  She reports the other medication for sleep is causing headaches.  Patient agrees to contact CMA to relay information to NP Shuvon Rankin.  Patient reports additional stress related to experiencing pain related to sciatica.  She is scheduled for an injection on Thursday.  Patient continues to work but still continues to report diminished interest in other activities she previously enjoyed such as exercising.  She also continues to experience significant sleep difficulty.  Patient also continues to experience guilt and anger regarding her son's death.  She continues to experience crying spells and tearfulness at times.     Suicidal/Homicidal: Nowithout intent/plan  Therapist Response: Reviewed symptoms, praised and reinforced patient's medication compliance, advised patient to contact CMA in NP Shuvon Rankin regarding side effects of one of her medications, facilitated patient sharing more information regarding her son's death, normalized feelings related to grief and loss as well as grief bursts, continue to discuss, reactions to grief, developed treatment plan, sent signature page and treatment plan to patient via MyChart, will send patient handout regarding sleep hygiene via mail    Plan: Return again in 2 weeks.  Diagnosis: Mild episode of recurrent major depressive disorder                      Collaboration of Care: Primary Care Provider AEB patient sees PCP Dr. Rollene Pesa  Patient/Guardian was advised Release of Information must be obtained prior to any record release in order to collaborate their care with an outside provider. Patient/Guardian was advised if they have not already done so to contact the registration  department to sign all necessary forms in order for us  to release information regarding their care.   Consent: Patient/Guardian gives verbal consent for  treatment and assignment of benefits for services provided during this visit. Patient/Guardian expressed understanding and agreed to proceed.   Winton FORBES Rubinstein, LCSW 12/18/2023

## 2023-12-19 ENCOUNTER — Other Ambulatory Visit: Payer: Self-pay | Admitting: Physical Medicine and Rehabilitation

## 2023-12-19 ENCOUNTER — Encounter: Payer: Self-pay | Admitting: Physical Medicine and Rehabilitation

## 2023-12-19 ENCOUNTER — Telehealth (HOSPITAL_COMMUNITY): Payer: Self-pay

## 2023-12-19 MED ORDER — TRAMADOL HCL 50 MG PO TABS: 50.0000 mg | ORAL_TABLET | Freq: Three times a day (TID) | ORAL | 0 refills | Status: DC | PRN

## 2023-12-19 NOTE — Telephone Encounter (Signed)
 Pt called in stating that she is having some side effects from the mirtazapine , says she is having headaches that wake her up at night that are very severe. Please advise pt scheduled 01/04/24. Has been going on since 3 or 4th night of starting medication

## 2023-12-20 NOTE — Telephone Encounter (Signed)
 Spoke with pt advised of shuvon's message pt verbalized understanding

## 2023-12-21 ENCOUNTER — Other Ambulatory Visit: Payer: Self-pay

## 2023-12-21 ENCOUNTER — Ambulatory Visit (INDEPENDENT_AMBULATORY_CARE_PROVIDER_SITE_OTHER): Admitting: Physical Medicine and Rehabilitation

## 2023-12-21 VITALS — BP 131/82 | HR 83

## 2023-12-21 DIAGNOSIS — M5416 Radiculopathy, lumbar region: Secondary | ICD-10-CM | POA: Diagnosis not present

## 2023-12-21 MED ORDER — METHYLPREDNISOLONE ACETATE 40 MG/ML IJ SUSP
40.0000 mg | Freq: Once | INTRAMUSCULAR | Status: AC
  Administered 2023-12-21: 40 mg

## 2023-12-21 NOTE — Progress Notes (Signed)
 Pain Scale   Average Pain 8 Patient advising she has lower back pain radiating to left leg and pain get worse when standing and bed and get better when sitting and elevating her legs        +Driver, -BT, -Dye Allergies.

## 2023-12-25 NOTE — Progress Notes (Signed)
 [name] - 53 y.o. female MRN [id number]  Date of birth: 03-01-1971  Office Visit Note: Visit Date: 12/21/2023 PCP: Antonetta Rollene BRAVO, MD Referred by: Antonetta Rollene BRAVO, MD  Subjective: Chief Complaint  Patient presents with   Lower Back - Pain   HPI:  [name] is a 53 y.o. female who comes in today at the request of Duwaine Pouch, FNP for planned Left L5-S1 Lumbar Interlaminar epidural steroid injection with fluoroscopic guidance.  The patient has failed conservative care including home exercise, medications, time and activity modification.  This injection will be diagnostic and hopefully therapeutic.  Please see requesting physician notes for further details and justification.   ROS Otherwise per HPI.  Assessment & Plan: Visit Diagnoses:    ICD-10-CM   1. Lumbar radiculopathy  M54.16 XR C-ARM NO REPORT    Epidural Steroid injection    methylPREDNISolone  acetate (DEPO-MEDROL ) injection 40 mg      Plan: No additional findings.   Meds & Orders:  Meds ordered this encounter  Medications   methylPREDNISolone  acetate (DEPO-MEDROL ) injection 40 mg    Orders Placed This Encounter  Procedures   XR C-ARM NO REPORT   Epidural Steroid injection    Follow-up: Return for visit to requesting provider as needed.   Procedures: No procedures performed  Lumbar Epidural Steroid Injection - Interlaminar Approach with Fluoroscopic Guidance  Patient: [name]      Date of Birth: 1970-06-30 MRN: [id number] PCP: Antonetta Rollene BRAVO, MD      Visit Date: 12/21/2023   Universal Protocol:     Consent Given By: the patient  Position: PRONE  Additional Comments: Vital signs were monitored before and after the procedure. Patient was prepped and draped in the usual sterile fashion. The correct patient, procedure, and site was verified.   Injection Procedure Details:   Procedure diagnoses: Lumbar radiculopathy [M54.16]   Meds Administered:  Meds ordered this  encounter  Medications   methylPREDNISolone  acetate (DEPO-MEDROL ) injection 40 mg     Laterality: Left  Location/Site:  L5-S1  Needle: 3.5 in., 20 ga. Tuohy  Needle Placement: Paramedian epidural  Findings:   -Comments: Excellent flow of contrast into the epidural space.  Procedure Details: Using a paramedian approach from the side mentioned above, the region overlying the inferior lamina was localized under fluoroscopic visualization and the soft tissues overlying this structure were infiltrated with 4 ml. of 1% Lidocaine  without Epinephrine. The Tuohy needle was inserted into the epidural space using a paramedian approach.   The epidural space was localized using loss of resistance along with counter oblique bi-planar fluoroscopic views.  After negative aspirate for air, blood, and CSF, a 2 ml. volume of Isovue -250 was injected into the epidural space and the flow of contrast was observed. Radiographs were obtained for documentation purposes.    The injectate was administered into the level noted above.   Additional Comments:  The patient tolerated the procedure well Dressing: 2 x 2 sterile gauze and Band-Aid    Post-procedure details: Patient was observed during the procedure. Post-procedure instructions were reviewed.  Patient left the clinic in stable condition.   Clinical History: EXAM: MRI LUMBAR SPINE 11/10/2023 09:29:53 AM   TECHNIQUE: Multiplanar multisequence MRI of the lumbar spine was performed without the administration of intravenous contrast.   COMPARISON: 10/29/2023 lumbar spine CT   CLINICAL HISTORY: Low back pain, symptoms persist with > 6 wks treatment. Low back pain x 2 years; NKI; No surgery   FINDINGS:  BONES AND ALIGNMENT: Grade 1 retrolisthesis at L5-S1. Inferior endplate Schmorl's node at L5 with Modic type 2 endplate signal changes.   SPINAL CORD: The conus terminates normally.   SOFT TISSUES: No paraspinal mass.   L1-L2: No  significant disc herniation. No spinal canal stenosis or neural foraminal narrowing.   L2-L3: No significant disc herniation. No spinal canal stenosis or neural foraminal narrowing.   L3-L4: Moderate facet hypertrophy with minimal disc bulge. No central spinal canal or neural foraminal stenosis.   L4-L5: Mild facet hypertrophy with small disc bulge narrowing the lateral recesses. No central spinal canal or neural foraminal stenosis.   L5-S1: Small disc bulge with endplate spurring with narrowing of the lateral recesses and mild right, moderate left foraminal stenosis.   IMPRESSION: 1. L5-S1: Small disc bulge with endplate spurring, narrowing of the lateral recesses, and mild right, moderate left foraminal stenosis. 2. L4-5: Small disc bulge narrowing the lateral recesses. No central spinal canal or neural foraminal stenosis.   Electronically signed by: Franky Stanford MD 11/20/2023 10:45 AM EDT RP Workstation: [id number]     Objective:  VS:  HT:    WT:   BMI:     BP:131/82  HR:83bpm  TEMP: ( )  RESP:  Physical Exam Vitals and nursing note reviewed.  Constitutional:      General: She is not in acute distress.    Appearance: Normal appearance. She is not ill-appearing.  HENT:     Head: Normocephalic and atraumatic.     Right Ear: External ear normal.     Left Ear: External ear normal.  Eyes:     Extraocular Movements: Extraocular movements intact.  Cardiovascular:     Rate and Rhythm: Normal rate.     Pulses: Normal pulses.  Pulmonary:     Effort: Pulmonary effort is normal. No respiratory distress.  Abdominal:     General: There is no distension.     Palpations: Abdomen is soft.  Musculoskeletal:        General: Tenderness present.     Cervical back: Neck supple.     Right lower leg: No edema.     Left lower leg: No edema.     Comments: Patient has good distal strength with no pain over the greater trochanters.  No clonus or focal weakness.  Skin:     Findings: No erythema, lesion or rash.  Neurological:     General: No focal deficit present.     Mental Status: She is alert and oriented to person, place, and time.     Sensory: No sensory deficit.     Motor: No weakness or abnormal muscle tone.     Coordination: Coordination normal.  Psychiatric:        Mood and Affect: Mood normal.        Behavior: Behavior normal.      Imaging: No results found.

## 2023-12-25 NOTE — Procedures (Signed)
 Lumbar Epidural Steroid Injection - Interlaminar Approach with Fluoroscopic Guidance  Patient: [name]      Date of Birth: [birth year] MRN: [id number] PCP: Antonetta Rollene BRAVO, MD      Visit Date: 12/21/2023   Universal Protocol:     Consent Given By: the patient  Position: PRONE  Additional Comments: Vital signs were monitored before and after the procedure. Patient was prepped and draped in the usual sterile fashion. The correct patient, procedure, and site was verified.   Injection Procedure Details:   Procedure diagnoses: Lumbar radiculopathy [M54.16]   Meds Administered:  Meds ordered this encounter  Medications   methylPREDNISolone  acetate (DEPO-MEDROL ) injection 40 mg     Laterality: Left  Location/Site:  L5-S1  Needle: 3.5 in., 20 ga. Tuohy  Needle Placement: Paramedian epidural  Findings:   -Comments: Excellent flow of contrast into the epidural space.  Procedure Details: Using a paramedian approach from the side mentioned above, the region overlying the inferior lamina was localized under fluoroscopic visualization and the soft tissues overlying this structure were infiltrated with 4 ml. of 1% Lidocaine  without Epinephrine. The Tuohy needle was inserted into the epidural space using a paramedian approach.   The epidural space was localized using loss of resistance along with counter oblique bi-planar fluoroscopic views.  After negative aspirate for air, blood, and CSF, a 2 ml. volume of Isovue -250 was injected into the epidural space and the flow of contrast was observed. Radiographs were obtained for documentation purposes.    The injectate was administered into the level noted above.   Additional Comments:  The patient tolerated the procedure well Dressing: 2 x 2 sterile gauze and Band-Aid    Post-procedure details: Patient was observed during the procedure. Post-procedure instructions were reviewed.  Patient left the clinic in stable  condition.

## 2024-01-01 ENCOUNTER — Encounter: Payer: Self-pay | Admitting: Family Medicine

## 2024-01-01 ENCOUNTER — Ambulatory Visit (HOSPITAL_COMMUNITY)
Admission: RE | Admit: 2024-01-01 | Discharge: 2024-01-01 | Disposition: A | Discharge status: Home / Self Care | Source: Ambulatory Visit | Attending: Family Medicine | Admitting: Family Medicine

## 2024-01-01 ENCOUNTER — Other Ambulatory Visit: Payer: Self-pay | Admitting: Internal Medicine

## 2024-01-01 ENCOUNTER — Encounter (HOSPITAL_COMMUNITY): Payer: Self-pay

## 2024-01-01 DIAGNOSIS — Z1231 Encounter for screening mammogram for malignant neoplasm of breast: Secondary | ICD-10-CM | POA: Insufficient documentation

## 2024-01-01 DIAGNOSIS — R110 Nausea: Secondary | ICD-10-CM

## 2024-01-01 MED ORDER — ONDANSETRON 4 MG PO TBDP: 4.0000 mg | ORAL_TABLET | Freq: Three times a day (TID) | ORAL | 0 refills | Status: DC | PRN

## 2024-01-03 ENCOUNTER — Encounter: Payer: Self-pay | Admitting: Physical Medicine and Rehabilitation

## 2024-01-03 ENCOUNTER — Ambulatory Visit (HOSPITAL_COMMUNITY): Admitting: Psychiatry

## 2024-01-03 DIAGNOSIS — F330 Major depressive disorder, recurrent, mild: Secondary | ICD-10-CM | POA: Diagnosis not present

## 2024-01-03 NOTE — Progress Notes (Signed)
   IN-PERSON  THERAPIST PROGRESS NOTE  Session Time:  Wednesday 01/03/2024 11:15 AM - 11:58 AM   Participation Level: Active  Behavioral Response: CasualAlertDepressed/tearful  Type of Therapy: Individual Therapy  Treatment Goals addressed: Pt will reengage in previously enjoyed activities AEB participating in exercise 4 x week for 30 days /Improve self care AEB pt implementing a night time ritual/ Identify and verbalize feelings associated with grief and loss   ProgressTowards Goals:  Progressing   Interventions: Supportive/grief therapy  Summary: [name] is a 53 y.o. female who is referred for services by PCP Dr. Rollene Pesa. Pt reports two psychiatric hosptializations at [facility] in [place] about 20 years ago. Pt participated in counseling with Redell Corn, LCSW and last was seen in April 2025.  Patient reports her 56 year old son died in 2024-05-24due to suicide, drowning, and behavioral health issues.  He had schizophrenia.  Patient states feeling stuck and not knowing any strategies to bring self out when she thinks about her son.  Patient reports irritability, anxiety, difficulty falling and staying asleep, ruminating thoughts, fatigue, tearfulness, difficulty concentrating, and muscle tension.  Patient last was seen about  2 weeks ago.  Patient reports continued grief and loss issues related to the death of her son last year.  She states she hasn't dwelt on thoughts about her son as much, has been able to continue working, and has been able to think more about other people. She becomes tearful in session today but reports difficulty allowing herself to experience her pain/grief when outside of session. She continues to experience guilt about son's death.  His birthday is in 2 weeks and pt plans to prepare three of his favorite dishes. She also plans to invite her daughters to have dinner to celebrate his life. Pt reports she did not receive handout on sleep hygiene.     Suicidal/Homicidal: Nowithout intent/plan  Therapist Response: Reviewed symptoms, praised and reinforced patient's efforts to increase physical activity, provided patient with ways to improve sleep hygiene and developed a plan with patient to review, facilitated patient expressing thoughts and feelings about her son assisted patient identify lost dreams related to son's death, assisted patient identify and verbalize feelings anger and guilt, assisted patient began to identify list of regrets, discussed rationale for and developed plan with patient to her son and bring to next session, provided psychoeducation on integrated grief, praised and reinforced patient's efforts to plan to celebrate her son's life on his birthday, discussed the roller coaster of grief and provided patient with handout to review to normalize feelings associated with grief and to encourage patient to allow self to experience her feelings    Plan: Return again in 2 weeks.  Diagnosis: Mild episode of recurrent major depressive disorder                      Collaboration of Care: Primary Care Provider AEB patient sees PCP Dr. Rollene Pesa  Patient/Guardian was advised Release of Information must be obtained prior to any record release in order to collaborate their care with an outside provider. Patient/Guardian was advised if they have not already done so to contact the registration department to sign all necessary forms in order for us  to release information regarding their care.   Consent: Patient/Guardian gives verbal consent for treatment and assignment of benefits for services provided during this visit. Patient/Guardian expressed understanding and agreed to proceed.   Winton FORBES Rubinstein, LCSW 01/03/2024

## 2024-01-04 ENCOUNTER — Telehealth (INDEPENDENT_AMBULATORY_CARE_PROVIDER_SITE_OTHER): Admitting: Registered Nurse

## 2024-01-04 ENCOUNTER — Other Ambulatory Visit: Payer: Self-pay | Admitting: Physical Medicine and Rehabilitation

## 2024-01-04 ENCOUNTER — Encounter (HOSPITAL_COMMUNITY): Payer: Self-pay | Admitting: Registered Nurse

## 2024-01-04 ENCOUNTER — Other Ambulatory Visit (HOSPITAL_COMMUNITY): Payer: Self-pay | Admitting: Family Medicine

## 2024-01-04 DIAGNOSIS — F332 Major depressive disorder, recurrent severe without psychotic features: Secondary | ICD-10-CM | POA: Diagnosis not present

## 2024-01-04 DIAGNOSIS — F4381 Prolonged grief disorder: Secondary | ICD-10-CM

## 2024-01-04 DIAGNOSIS — F411 Generalized anxiety disorder: Secondary | ICD-10-CM

## 2024-01-04 DIAGNOSIS — R928 Other abnormal and inconclusive findings on diagnostic imaging of breast: Secondary | ICD-10-CM

## 2024-01-04 DIAGNOSIS — G4700 Insomnia, unspecified: Secondary | ICD-10-CM | POA: Diagnosis not present

## 2024-01-04 MED ORDER — PROPRANOLOL HCL 10 MG PO TABS: 10.0000 mg | ORAL_TABLET | Freq: Three times a day (TID) | ORAL | 1 refills | Status: DC | PRN

## 2024-01-04 MED ORDER — MIRTAZAPINE 7.5 MG PO TABS: 7.5000 mg | ORAL_TABLET | Freq: Every day | ORAL | 1 refills | Status: DC

## 2024-01-04 MED ORDER — TRAMADOL HCL 50 MG PO TABS: 50.0000 mg | ORAL_TABLET | Freq: Three times a day (TID) | ORAL | 0 refills | Status: DC | PRN

## 2024-01-04 MED ORDER — VENLAFAXINE HCL ER 75 MG PO CP24: 75.0000 mg | ORAL_CAPSULE | Freq: Every day | ORAL | 1 refills | Status: DC

## 2024-01-04 NOTE — Patient Instructions (Signed)
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
## 2024-01-08 ENCOUNTER — Encounter: Payer: Self-pay | Admitting: Radiology

## 2024-01-12 ENCOUNTER — Ambulatory Visit: Admitting: Physical Medicine and Rehabilitation

## 2024-01-12 ENCOUNTER — Encounter: Payer: Self-pay | Admitting: Physical Medicine and Rehabilitation

## 2024-01-12 DIAGNOSIS — M5442 Lumbago with sciatica, left side: Secondary | ICD-10-CM | POA: Diagnosis not present

## 2024-01-12 DIAGNOSIS — M5416 Radiculopathy, lumbar region: Secondary | ICD-10-CM | POA: Diagnosis not present

## 2024-01-12 DIAGNOSIS — M48061 Spinal stenosis, lumbar region without neurogenic claudication: Secondary | ICD-10-CM | POA: Diagnosis not present

## 2024-01-12 DIAGNOSIS — M4310 Spondylolisthesis, site unspecified: Secondary | ICD-10-CM

## 2024-01-12 DIAGNOSIS — G8929 Other chronic pain: Secondary | ICD-10-CM | POA: Diagnosis not present

## 2024-01-12 NOTE — Progress Notes (Signed)
 Pain Scale   Average Pain 8 Patient advising she has lower back pain that is constant and injection did not help.        +Driver, -BT, -Dye Allergies.
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
## 2024-01-15 ENCOUNTER — Other Ambulatory Visit: Payer: Self-pay | Admitting: Physical Medicine and Rehabilitation

## 2024-01-17 ENCOUNTER — Ambulatory Visit (INDEPENDENT_AMBULATORY_CARE_PROVIDER_SITE_OTHER): Admitting: Psychiatry

## 2024-01-17 DIAGNOSIS — F330 Major depressive disorder, recurrent, mild: Secondary | ICD-10-CM

## 2024-01-17 DIAGNOSIS — F332 Major depressive disorder, recurrent severe without psychotic features: Secondary | ICD-10-CM

## 2024-01-17 NOTE — Progress Notes (Signed)
 IN-PERSON  THERAPIST PROGRESS NOTE  Session Time:  Wednesday 01/17/2024 11:08 AM 11:53 AM   Participation Level: Active  Behavioral Response: CasualAlertDepressed/tearful  Type of Therapy: Individual Therapy  Treatment Goals addressed: Pt will reengage in previously enjoyed activities AEB participating in exercise 4 x week for 30 days /Improve self care AEB pt implementing a night time ritual/ Identify and verbalize feelings associated with grief and loss   ProgressTowards Goals:  Progressing   Interventions: Supportive/grief therapy  Summary: [name] is a 53 y.o. female who is referred for services by PCP Dr. Rollene [name]. Pt reports two psychiatric hosptializations at [facility] in [place] about 20 years ago. Pt participated in counseling with Redell Corn, LCSW and last was seen in April 2025.  Patient reports her 94 year old son died in 06-05-24due to suicide, drowning, and behavioral health issues.  He had schizophrenia.  Patient states feeling stuck and not knowing any strategies to bring self out when she thinks about her son.  Patient reports irritability, anxiety, difficulty falling and staying asleep, ruminating thoughts, fatigue, tearfulness, difficulty concentrating, and muscle tension.  Patient last was seen about  2 weeks ago.  Patient reports continued grief and loss issues related to the death of her son last year.  However, pt now reports starting to allow herself to be vulnerable and experience her emotions. She reports recently receiving son's autopsy report. She allowed herself to cry and share her feelings with her husband regarding this. Pt also wrote letter to son expressing her regrets. She reports still struggling with guilt. She reports thoughts of failing her son. She is pleased she honored her son on his birthday by preparing some of his favorite dishes and having dinner with her family. She continues to have sleep difficulty but has tried to implement  steps to improve sleep hygiene.  Patient continues to attend work regularly  Suicidal/Homicidal: Nowithout intent/plan  Therapist Response: Reviewed symptoms, praised and reinforced patient's efforts to write letter to her son, had patient read letter in session, assisted patient identify and challenge thoughts evoking inappropriate guilt and thoughts of failure, praised and reinforced patient's efforts to allow self to experience her feelings related to grief as well as express her grief to her her husband, discussed effects, praised and reinforced patient's efforts to honor son on his birthday, discussed effects assisted patient identify the effects, discussed rationale for and developed plan with patient to bring 3 pictures or objects that remind her of her son to next session, praised and reinforced patient's efforts to improve sleep hygiene, discussed rationale for and developed plan with patient to practice progressive muscle relaxation at night just prior to bedtime, checked out interactive audio activity to patient and provided with access code  Plan: Return again in 2 weeks.  Diagnosis: Mild episode of recurrent major depressive disorder                      Collaboration of Care: Primary Care Provider AEB patient sees PCP Dr. Rollene [name]  Patient/Guardian was advised Release of Information must be obtained prior to any record release in order to collaborate their care with an outside provider. Patient/Guardian was advised if they have not already done so to contact the registration department to sign all necessary forms in order for us  to release information regarding their care.   Consent: Patient/Guardian gives verbal consent for treatment and assignment of benefits for services provided during this visit. Patient/Guardian expressed understanding and agreed  to proceed.   Winton FORBES Rubinstein, LCSW 01/17/2024

## 2024-01-18 ENCOUNTER — Ambulatory Visit (HOSPITAL_COMMUNITY)
Admission: RE | Admit: 2024-01-18 | Discharge: 2024-01-18 | Disposition: A | Source: Ambulatory Visit | Attending: Family Medicine | Admitting: Family Medicine

## 2024-01-18 ENCOUNTER — Ambulatory Visit: Payer: Self-pay | Admitting: Family Medicine

## 2024-01-18 ENCOUNTER — Encounter (HOSPITAL_COMMUNITY): Payer: Self-pay

## 2024-01-18 ENCOUNTER — Ambulatory Visit (HOSPITAL_COMMUNITY)
Admission: RE | Admit: 2024-01-18 | Discharge: 2024-01-18 | Disposition: A | Discharge status: Home / Self Care | Source: Ambulatory Visit | Attending: Family Medicine | Admitting: Family Medicine

## 2024-01-18 DIAGNOSIS — R928 Other abnormal and inconclusive findings on diagnostic imaging of breast: Secondary | ICD-10-CM | POA: Insufficient documentation

## 2024-01-18 DIAGNOSIS — R921 Mammographic calcification found on diagnostic imaging of breast: Secondary | ICD-10-CM | POA: Diagnosis not present

## 2024-01-18 DIAGNOSIS — R92333 Mammographic heterogeneous density, bilateral breasts: Secondary | ICD-10-CM | POA: Diagnosis not present

## 2024-01-22 ENCOUNTER — Ambulatory Visit: Admitting: Physical Medicine and Rehabilitation

## 2024-01-22 ENCOUNTER — Other Ambulatory Visit: Payer: Self-pay

## 2024-01-22 VITALS — BP 127/81 | HR 108

## 2024-01-22 DIAGNOSIS — M5416 Radiculopathy, lumbar region: Secondary | ICD-10-CM | POA: Diagnosis not present

## 2024-01-22 MED ORDER — METHYLPREDNISOLONE ACETATE 40 MG/ML IJ SUSP
40.0000 mg | Freq: Once | INTRAMUSCULAR | Status: AC
  Administered 2024-01-22: 40 mg

## 2024-01-22 NOTE — Progress Notes (Signed)
 Pain Scale   Average Pain 8 Patient advising she as chronic lower back pain radiating to left leg pain increases when standing and decreases when sitting        +Driver, -BT, -Dye Allergies.

## 2024-01-23 ENCOUNTER — Other Ambulatory Visit: Payer: Self-pay | Admitting: Oncology

## 2024-01-28 NOTE — Progress Notes (Signed)
 [name] - 53 y.o. female MRN [id number]  Date of birth: [birth year]  Office Visit Note: Visit Date: 01/22/2024 PCP: Antonetta Rollene BRAVO, MD Referred by: Antonetta Rollene BRAVO, MD  Subjective: Chief Complaint  Patient presents with   Lower Back - Pain   HPI:  [name] is a 53 y.o. female who comes in today at the request of Duwaine Pouch, FNP for planned Left S1-2 Lumbar Transforaminal epidural steroid injection with fluoroscopic guidance.  The patient has failed conservative care including home exercise, medications, time and activity modification.  This injection will be diagnostic and hopefully therapeutic.  Please see requesting physician notes for further details and justification.   ROS Otherwise per HPI.  Assessment & Plan: Visit Diagnoses:    ICD-10-CM   1. Lumbar radiculopathy  M54.16 XR C-ARM NO REPORT    Epidural Steroid injection    methylPREDNISolone  acetate (DEPO-MEDROL ) injection 40 mg      Plan: No additional findings.   Meds & Orders:  Meds ordered this encounter  Medications   methylPREDNISolone  acetate (DEPO-MEDROL ) injection 40 mg    Orders Placed This Encounter  Procedures   XR C-ARM NO REPORT   Epidural Steroid injection    Follow-up: Return for visit to requesting provider as needed.   Procedures: No procedures performed  S1 Lumbosacral Transforaminal Epidural Steroid Injection - Sub-Pedicular Approach with Fluoroscopic Guidance   Patient: [name]      Date of Birth: 07-05-1970 MRN: [id number] PCP: Antonetta Rollene BRAVO, MD      Visit Date: 01/22/2024   Universal Protocol:    Date/Time: 11/23/256:57 PM  Consent Given By: the patient  Position:  PRONE  Additional Comments: Vital signs were monitored before and after the procedure. Patient was prepped and draped in the usual sterile fashion. The correct patient, procedure, and site was verified.   Injection Procedure Details:  Procedure Site One Meds Administered:   Meds ordered this encounter  Medications   methylPREDNISolone  acetate (DEPO-MEDROL ) injection 40 mg    Laterality: Left  Location/Site:  S1 Foramen   Needle size: 22 ga.  Needle type: Spinal  Needle Placement: Transforaminal  Findings:   -Comments: Excellent flow of contrast along the nerve, nerve root and into the epidural space.  Epidurogram: Contrast epidurogram showed no nerve root cut off or restricted flow pattern.  Procedure Details: After squaring off the sacral end-plate to get a true AP view, the C-arm was positioned so that the best possible view of the S1 foramen was visualized. The soft tissues overlying this structure were infiltrated with 2-3 ml. of 1% Lidocaine  without Epinephrine.    The spinal needle was inserted toward the target using a trajectory view along the fluoroscope beam.  Under AP and lateral visualization, the needle was advanced so it did not puncture dura. Biplanar projections were used to confirm position. Aspiration was confirmed to be negative for CSF and/or blood. A 1-2 ml. volume of Isovue -250 was injected and flow of contrast was noted at each level. Radiographs were obtained for documentation purposes.   After attaining the desired flow of contrast documented above, a 0.5 to 1.0 ml test dose of 0.25% Marcaine was injected into each respective transforaminal space.  The patient was observed for 90 seconds post injection.  After no sensory deficits were reported, and normal lower extremity motor function was noted,   the above injectate was administered so that equal amounts of the injectate were placed at each foramen (level) into the  transforaminal epidural space.   Additional Comments:  The patient tolerated the procedure well Dressing: Band-Aid with 2 x 2 sterile gauze    Post-procedure details: Patient was observed during the procedure. Post-procedure instructions were reviewed.  Patient left the clinic in stable condition.    Clinical History: EXAM: MRI LUMBAR SPINE 11/10/2023 09:29:53 AM   TECHNIQUE: Multiplanar multisequence MRI of the lumbar spine was performed without the administration of intravenous contrast.   COMPARISON: 10/29/2023 lumbar spine CT   CLINICAL HISTORY: Low back pain, symptoms persist with > 6 wks treatment. Low back pain x 2 years; NKI; No surgery   FINDINGS:   BONES AND ALIGNMENT: Grade 1 retrolisthesis at L5-S1. Inferior endplate Schmorl's node at L5 with Modic type 2 endplate signal changes.   SPINAL CORD: The conus terminates normally.   SOFT TISSUES: No paraspinal mass.   L1-L2: No significant disc herniation. No spinal canal stenosis or neural foraminal narrowing.   L2-L3: No significant disc herniation. No spinal canal stenosis or neural foraminal narrowing.   L3-L4: Moderate facet hypertrophy with minimal disc bulge. No central spinal canal or neural foraminal stenosis.   L4-L5: Mild facet hypertrophy with small disc bulge narrowing the lateral recesses. No central spinal canal or neural foraminal stenosis.   L5-S1: Small disc bulge with endplate spurring with narrowing of the lateral recesses and mild right, moderate left foraminal stenosis.   IMPRESSION: 1. L5-S1: Small disc bulge with endplate spurring, narrowing of the lateral recesses, and mild right, moderate left foraminal stenosis. 2. L4-5: Small disc bulge narrowing the lateral recesses. No central spinal canal or neural foraminal stenosis.   Electronically signed by: Franky Stanford MD 11/20/2023 10:45 AM EDT RP Workstation: [id number]     Objective:  VS:  HT:    WT:   BMI:     BP:127/81  HR:(!) 108bpm  TEMP: ( )  RESP:  Physical Exam Vitals and nursing note reviewed.  Constitutional:      General: She is not in acute distress.    Appearance: Normal appearance. She is obese. She is not ill-appearing.  HENT:     Head: Normocephalic and atraumatic.     Right Ear: External ear  normal.     Left Ear: External ear normal.  Eyes:     Extraocular Movements: Extraocular movements intact.  Cardiovascular:     Rate and Rhythm: Normal rate.     Pulses: Normal pulses.  Pulmonary:     Effort: Pulmonary effort is normal. No respiratory distress.  Abdominal:     General: There is no distension.     Palpations: Abdomen is soft.  Musculoskeletal:        General: Tenderness present.     Cervical back: Neck supple.     Right lower leg: No edema.     Left lower leg: No edema.     Comments: Patient has good distal strength with no pain over the greater trochanters.  No clonus or focal weakness.  Skin:    Findings: No erythema, lesion or rash.  Neurological:     General: No focal deficit present.     Mental Status: She is alert and oriented to person, place, and time.     Sensory: No sensory deficit.     Motor: No weakness or abnormal muscle tone.     Coordination: Coordination normal.  Psychiatric:        Mood and Affect: Mood normal.        Behavior: Behavior normal.  Imaging: No results found.

## 2024-01-28 NOTE — Procedures (Signed)
 S1 Lumbosacral Transforaminal Epidural Steroid Injection - Sub-Pedicular Approach with Fluoroscopic Guidance   Patient: [name]      Date of Birth: Jan 07, 1971 MRN: [id number] PCP: Antonetta Rollene BRAVO, MD      Visit Date: 01/22/2024   Universal Protocol:    Date/Time: 11/23/256:57 PM  Consent Given By: the patient  Position:  PRONE  Additional Comments: Vital signs were monitored before and after the procedure. Patient was prepped and draped in the usual sterile fashion. The correct patient, procedure, and site was verified.   Injection Procedure Details:  Procedure Site One Meds Administered:  Meds ordered this encounter  Medications   methylPREDNISolone  acetate (DEPO-MEDROL ) injection 40 mg    Laterality: Left  Location/Site:  S1 Foramen   Needle size: 22 ga.  Needle type: Spinal  Needle Placement: Transforaminal  Findings:   -Comments: Excellent flow of contrast along the nerve, nerve root and into the epidural space.  Epidurogram: Contrast epidurogram showed no nerve root cut off or restricted flow pattern.  Procedure Details: After squaring off the sacral end-plate to get a true AP view, the C-arm was positioned so that the best possible view of the S1 foramen was visualized. The soft tissues overlying this structure were infiltrated with 2-3 ml. of 1% Lidocaine  without Epinephrine.    The spinal needle was inserted toward the target using a trajectory view along the fluoroscope beam.  Under AP and lateral visualization, the needle was advanced so it did not puncture dura. Biplanar projections were used to confirm position. Aspiration was confirmed to be negative for CSF and/or blood. A 1-2 ml. volume of Isovue -250 was injected and flow of contrast was noted at each level. Radiographs were obtained for documentation purposes.   After attaining the desired flow of contrast documented above, a 0.5 to 1.0 ml test dose of 0.25% Marcaine was injected into  each respective transforaminal space.  The patient was observed for 90 seconds post injection.  After no sensory deficits were reported, and normal lower extremity motor function was noted,   the above injectate was administered so that equal amounts of the injectate were placed at each foramen (level) into the transforaminal epidural space.   Additional Comments:  The patient tolerated the procedure well Dressing: Band-Aid with 2 x 2 sterile gauze    Post-procedure details: Patient was observed during the procedure. Post-procedure instructions were reviewed.  Patient left the clinic in stable condition.

## 2024-01-29 ENCOUNTER — Other Ambulatory Visit: Payer: Self-pay | Admitting: Physical Medicine and Rehabilitation

## 2024-01-30 ENCOUNTER — Other Ambulatory Visit: Payer: Self-pay | Admitting: Internal Medicine

## 2024-01-30 DIAGNOSIS — R110 Nausea: Secondary | ICD-10-CM

## 2024-01-31 ENCOUNTER — Ambulatory Visit (HOSPITAL_COMMUNITY): Admitting: Psychiatry

## 2024-01-31 DIAGNOSIS — F332 Major depressive disorder, recurrent severe without psychotic features: Secondary | ICD-10-CM

## 2024-01-31 DIAGNOSIS — F330 Major depressive disorder, recurrent, mild: Secondary | ICD-10-CM | POA: Diagnosis not present
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
## 2024-02-05 ENCOUNTER — Telehealth (HOSPITAL_COMMUNITY): Admitting: Registered Nurse

## 2024-02-06 ENCOUNTER — Other Ambulatory Visit: Payer: Self-pay | Admitting: Internal Medicine

## 2024-02-06 DIAGNOSIS — R110 Nausea: Secondary | ICD-10-CM

## 2024-02-13 ENCOUNTER — Other Ambulatory Visit: Payer: Self-pay | Admitting: Internal Medicine

## 2024-02-13 DIAGNOSIS — R110 Nausea: Secondary | ICD-10-CM

## 2024-02-14 ENCOUNTER — Encounter: Payer: Self-pay | Admitting: Family Medicine

## 2024-02-15 NOTE — Telephone Encounter (Signed)
Virtual appt scheduled 

## 2024-02-16 ENCOUNTER — Ambulatory Visit: Admitting: Family Medicine

## 2024-02-16 VITALS — BP 112/74 | HR 90 | Resp 18 | Ht 67.5 in | Wt 207.1 lb

## 2024-02-16 DIAGNOSIS — B369 Superficial mycosis, unspecified: Secondary | ICD-10-CM | POA: Insufficient documentation

## 2024-02-16 MED ORDER — CLOTRIMAZOLE-BETAMETHASONE 1-0.05 % EX CREA: 1.0000 | TOPICAL_CREAM | Freq: Two times a day (BID) | CUTANEOUS | 1 refills | Status: AC

## 2024-02-16 MED ORDER — PREDNISONE 10 MG (21) PO TBPK: ORAL_TABLET | ORAL | 0 refills | Status: DC

## 2024-02-16 MED ORDER — TERBINAFINE HCL 250 MG PO TABS: 250.0000 mg | ORAL_TABLET | Freq: Every day | ORAL | 0 refills | Status: DC

## 2024-02-16 NOTE — Patient Instructions (Addendum)
 F/U as before  Two tablets and one cream are prescribed for the rash  Please intentionally keep skin well moisturized  Thanks for choosing [facility], we consider it a privelige to serve you.

## 2024-02-17 ENCOUNTER — Encounter: Payer: Self-pay | Admitting: Family Medicine

## 2024-02-17 NOTE — Progress Notes (Signed)
° °  [name]     MRN: [id number]      DOB: September 25, 1970  Chief Complaint  Patient presents with   Rash    Pt complains of rash/hives all over torse and back. Started Friday but has been spreading and increasing. No fever     HPI [name] is here with a 1 week h/o itchy rash and dry skin, no fever , chills or drainage, spreading over 40 % of her back, up to shoulders and around to left anterior chest under breast  ROS Denies recent fever or chills.   PE  BP 112/74   Pulse 90   Resp 18   Ht 5' 7.5 (1.715 m)   Wt 207 lb 1.9 oz (93.9 kg)   SpO2 95%   BMI 31.96 kg/m   Patient alert and oriented and in no cardiopulmonary distress.  HEENT: No facial asymmetry, EOMI,     Neck supple .  Chest: Clear to auscultation bilaterally.  CMS: Adequate ROM spine, shoulders, hips and knees.  Skin: Intact, very dry, hyperpigmented macular rash over approx 50 % back , some areas with raised borders, no erythema or warmth or drainage Psych: Good eye contact, normal affect. Memory intact not anxious or depressed appearing.  CNS: CN 2-12 intact, power,  normal throughout.no focal deficits noted.   Assessment & Plan  Dermatomycosis Posterior chest , shoulders and anterior chest involved, oral terbinafine  and fluconazole  and topical clotrimazole /betamethasone  sparingly Keep skin moisturized

## 2024-02-17 NOTE — Assessment & Plan Note (Signed)
 Posterior chest , shoulders and anterior chest involved, oral terbinafine  and fluconazole  and topical clotrimazole /betamethasone  sparingly Keep skin moisturized

## 2024-02-19 ENCOUNTER — Ambulatory Visit: Payer: Self-pay | Admitting: Family Medicine

## 2024-02-28 ENCOUNTER — Ambulatory Visit (HOSPITAL_COMMUNITY): Admitting: Psychiatry

## 2024-02-28 DIAGNOSIS — F330 Major depressive disorder, recurrent, mild: Secondary | ICD-10-CM | POA: Diagnosis not present

## 2024-02-28 DIAGNOSIS — F4381 Prolonged grief disorder: Secondary | ICD-10-CM

## 2024-02-28 NOTE — Progress Notes (Signed)
 "  IN-PERSON  THERAPIST PROGRESS NOTE  Session Time:  Wednesday 02/28/2024 10:10 AM - 10:54 AM   Participation Level: Active  Behavioral Response: CasualAlert, sad/tearful at times  Type of Therapy: Individual Therapy  Treatment Goals addressed: Pt will reengage in previously enjoyed activities AEB participating in exercise 4 x week for 30 days /Improve self care AEB pt implementing a night time ritual/ Identify and verbalize feelings associated with grief and loss   ProgressTowards Goals:  Progressing   Interventions: Supportive/grief therapy  Summary: [name] is a 53 y.o. female who is referred for services by PCP Dr. Rollene Pesa. Pt reports two psychiatric hosptializations at [facility] in [place] about 20 years ago. Pt participated in counseling with Redell Corn, LCSW and last was seen in April 2025.  Patient reports her 68 year old son died in June 25, 2024due to suicide, drowning, and behavioral health issues.  He had schizophrenia.  Patient states feeling stuck and not knowing any strategies to bring self out when she thinks about her son.  Patient reports irritability, anxiety, difficulty falling and staying asleep, ruminating thoughts, fatigue, tearfulness, difficulty concentrating, and muscle tension.  Patient last was seen about  4  weeks ago.  Patient reports doing well until 3-4 days ago. She reports being triggered by the holidays especially seeing other families plan for the holidays. This resulted in pt experiencing feelings of anger, resentment, and disgust. This also triggered feelings of guilt related to should and ought thoughts regarding her role as a parent to her son when he was a very young child. She blames self for not being more present due to working as she was a single parent at that time. She states talking to her husband and and asking him for a hug helped her manage. She states having good and bad days. She expresses frustration when others stare at her when  she is having a good day. Pt continues to work regularly. She is planning to rest for Christmas and states husband plan to cook dinner. She reports continued strong support from husband.   Suicidal/Homicidal: Nowithout intent/plan  Therapist Response: Reviewed symptoms, praised and reinforced patient's recognition of her feelings/her efforts and asking her husband for help and support, discussed effects, discussed acceptance of feelings, normalized grief bursts, facilitated patient expressing thoughts and feelings regarding her role as a parent when her son was a very young child, assisted patient identify/challenge/and replace thoughts evoking inappropriate guilt with more helpful thoughts, assisted patient distinguish between guilt and regret, began to discuss Mourner's Bill of Rights, assisted patient identify ways to use assertiveness skills to express her concerns and needs to people in her circle who want to be supportive, encouraged patient to follow through with her plans for self-care   Plan: Return again in 2 weeks.  Diagnosis: Mild episode of recurrent major depressive disorder                      Collaboration of Care: Primary Care Provider AEB patient sees PCP Dr. Rollene Pesa  Patient/Guardian was advised Release of Information must be obtained prior to any record release in order to collaborate their care with an outside provider. Patient/Guardian was advised if they have not already done so to contact the registration department to sign all necessary forms in order for us  to release information regarding their care.   Consent: Patient/Guardian gives verbal consent for treatment and assignment of benefits for services provided during this visit. Patient/Guardian expressed understanding and  agreed to proceed.   Winton FORBES Rubinstein, LCSW 02/28/2024  "

## 2024-03-04 ENCOUNTER — Encounter (HOSPITAL_COMMUNITY): Payer: Self-pay | Admitting: Registered Nurse

## 2024-03-04 ENCOUNTER — Encounter: Payer: Self-pay | Admitting: *Deleted

## 2024-03-04 ENCOUNTER — Telehealth (INDEPENDENT_AMBULATORY_CARE_PROVIDER_SITE_OTHER): Admitting: Registered Nurse

## 2024-03-04 DIAGNOSIS — F411 Generalized anxiety disorder: Secondary | ICD-10-CM

## 2024-03-04 DIAGNOSIS — F332 Major depressive disorder, recurrent severe without psychotic features: Secondary | ICD-10-CM

## 2024-03-04 DIAGNOSIS — F4381 Prolonged grief disorder: Secondary | ICD-10-CM | POA: Diagnosis not present

## 2024-03-04 DIAGNOSIS — G4700 Insomnia, unspecified: Secondary | ICD-10-CM

## 2024-03-04 MED ORDER — PROPRANOLOL HCL 10 MG PO TABS: 10.0000 mg | ORAL_TABLET | Freq: Three times a day (TID) | ORAL | 1 refills | Status: AC | PRN

## 2024-03-04 MED ORDER — VENLAFAXINE HCL ER 37.5 MG PO CP24: 37.5000 mg | ORAL_CAPSULE | Freq: Every day | ORAL | 1 refills | Status: AC

## 2024-03-04 MED ORDER — MIRTAZAPINE 15 MG PO TABS: 7.5000 mg | ORAL_TABLET | Freq: Every day | ORAL | 1 refills | Status: DC

## 2024-03-04 MED ORDER — VENLAFAXINE HCL ER 75 MG PO CP24: 75.0000 mg | ORAL_CAPSULE | Freq: Every day | ORAL | 1 refills | Status: AC
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
## 2024-03-04 NOTE — Patient Instructions (Signed)

## 2024-03-08 ENCOUNTER — Ambulatory Visit: Admitting: Family Medicine

## 2024-03-08 ENCOUNTER — Encounter: Payer: Self-pay | Admitting: Family Medicine

## 2024-03-08 VITALS — BP 118/80 | HR 83 | Resp 16 | Ht 67.5 in | Wt 215.1 lb

## 2024-03-08 DIAGNOSIS — Z23 Encounter for immunization: Secondary | ICD-10-CM | POA: Diagnosis not present

## 2024-03-08 DIAGNOSIS — R928 Other abnormal and inconclusive findings on diagnostic imaging of breast: Secondary | ICD-10-CM | POA: Diagnosis not present

## 2024-03-08 DIAGNOSIS — F322 Major depressive disorder, single episode, severe without psychotic features: Secondary | ICD-10-CM | POA: Diagnosis not present

## 2024-03-08 DIAGNOSIS — Z13220 Encounter for screening for lipoid disorders: Secondary | ICD-10-CM

## 2024-03-08 DIAGNOSIS — Z0001 Encounter for general adult medical examination with abnormal findings: Secondary | ICD-10-CM | POA: Diagnosis not present

## 2024-03-08 DIAGNOSIS — I10 Essential (primary) hypertension: Secondary | ICD-10-CM

## 2024-03-08 NOTE — Patient Instructions (Addendum)
 F/U in 4 months  Pneumonia vaccine today and Hep B #1  Hep B #2 Nurse visit in 2 months    Pls schedule mammogram, dioagnostic at checkout at [facility] as ordered for May 2026  Covid vaccine is recommended , please get this at your pharmacy in the next 2 to 3 weeks  Lipid, cmp and eGFr today  Let me kn ow if you need help finding group support for grief please  It is important that you exercise regularly at least 30 minutes 5 times a week. If you develop chest pain, have severe difficulty breathing, or feel very tired, stop exercising immediately and seek medical attention   Thanks for choosing [facility], we consider it a privelige to serve you.

## 2024-03-08 NOTE — Progress Notes (Addendum)
" ° ° °  [name]     MRN: [id number]      DOB: 19-Oct-1970  Chief Complaint  Patient presents with   Annual Exam    HPI: Patient is in for annual physical exam. Immunization is reviewed , and  updated .   PE: BP 118/80   Pulse 83   Resp 16   Ht 5' 7.5 (1.715 m)   Wt 215 lb 1.3 oz (97.6 kg)   SpO2 95%   BMI 33.19 kg/m   Pleasant  female, alert and oriented x 3, in no cardio-pulmonary distress. Afebrile. HEENT No facial trauma or asymetry. Sinuses non tender.  Extra occullar muscles intact.. External ears normal, . Neck: supple, no adenopathy,JVD or thyromegaly.No bruits.  Chest: Clear to ascultation bilaterally.No crackles or wheezes. Non tender to palpation  Breast: Not examined,mammogram is UTD , normal and pt is asymptomatic  Cardiovascular system; Heart sounds normal,  S1 and  S2 ,no S3.  No murmur, or thrill. Apical beat not displaced Peripheral pulses normal.  Abdomen: Soft, non tender     Musculoskeletal exam: Full ROM of spine, hips , shoulders and knees. No deformity ,swelling or crepitus noted. No muscle wasting or atrophy.   Neurologic: Cranial nerves 2 to 12 intact. Power, tone ,sensation  normal throughout. No disturbance in gait. No tremor.  Skin: Intact, no ulceration, erythema , scaling or rash noted. Pigmentation normal throughout  Psych; Normal mood and affect. Judgement and concentration normal   Assessment & Plan:  Encounter for Medicare annual examination with abnormal findings Annual exam as documented. . Immunization and cancer screening needs are specifically addressed at this visit.   Abnormal mammogram of left breast Rept imaging to be scheduled at checkout  Immunization due After obtaining informed consent, the influenza and Hep B vaccines are    administered , with no adverse effect noted at the time of administration.   Severe major depression without psychotic features (HCC) Followed closely by Psychiatry  , complicated by grief at loss of son. Not suicidal or homicidal  "

## 2024-03-09 ENCOUNTER — Encounter: Payer: Self-pay | Admitting: Family Medicine

## 2024-03-09 ENCOUNTER — Ambulatory Visit: Payer: Self-pay | Admitting: Family Medicine

## 2024-03-09 DIAGNOSIS — R928 Other abnormal and inconclusive findings on diagnostic imaging of breast: Secondary | ICD-10-CM | POA: Insufficient documentation

## 2024-03-09 DIAGNOSIS — F322 Major depressive disorder, single episode, severe without psychotic features: Secondary | ICD-10-CM | POA: Insufficient documentation

## 2024-03-09 DIAGNOSIS — Z0001 Encounter for general adult medical examination with abnormal findings: Secondary | ICD-10-CM | POA: Insufficient documentation

## 2024-03-09 LAB — CMP14+EGFR
ALT: 22 IU/L (ref 0–32)
AST: 17 IU/L (ref 0–40)
Albumin: 4 g/dL (ref 3.8–4.9)
Alkaline Phosphatase: 131 IU/L (ref 49–135)
BUN/Creatinine Ratio: 11 (ref 9–23)
BUN: 8 mg/dL (ref 6–24)
Bilirubin Total: 0.3 mg/dL (ref 0.0–1.2)
CO2: 25 mmol/L (ref 20–29)
Calcium: 9.4 mg/dL (ref 8.7–10.2)
Chloride: 101 mmol/L (ref 96–106)
Creatinine, Ser: 0.72 mg/dL (ref 0.57–1.00)
Globulin, Total: 3.1 g/dL (ref 1.5–4.5)
Glucose: 76 mg/dL (ref 70–99)
Potassium: 3.7 mmol/L (ref 3.5–5.2)
Sodium: 142 mmol/L (ref 134–144)
Total Protein: 7.1 g/dL (ref 6.0–8.5)
eGFR: 99 mL/min/1.73

## 2024-03-09 LAB — LIPID PANEL
Chol/HDL Ratio: 3 ratio (ref 0.0–4.4)
Cholesterol, Total: 209 mg/dL — ABNORMAL HIGH (ref 100–199)
HDL: 69 mg/dL
LDL Chol Calc (NIH): 116 mg/dL — ABNORMAL HIGH (ref 0–99)
Triglycerides: 136 mg/dL (ref 0–149)
VLDL Cholesterol Cal: 24 mg/dL (ref 5–40)

## 2024-03-09 NOTE — Assessment & Plan Note (Signed)
 Annual exam as documented. . Immunization and cancer screening needs are specifically addressed at this visit.

## 2024-03-09 NOTE — Assessment & Plan Note (Signed)
 Rept imaging to be scheduled at checkout

## 2024-03-09 NOTE — Assessment & Plan Note (Signed)
 Followed closely by Psychiatry , complicated by grief at loss of son. Not suicidal or homicidal

## 2024-03-09 NOTE — Assessment & Plan Note (Signed)
 After obtaining informed consent, the influenza and Hep B vaccines are    administered , with no adverse effect noted at the time of administration.

## 2024-03-11 ENCOUNTER — Other Ambulatory Visit (HOSPITAL_COMMUNITY): Payer: Self-pay | Admitting: Family Medicine

## 2024-03-11 DIAGNOSIS — R928 Other abnormal and inconclusive findings on diagnostic imaging of breast: Secondary | ICD-10-CM

## 2024-03-21 ENCOUNTER — Ambulatory Visit (HOSPITAL_COMMUNITY): Admitting: Psychiatry

## 2024-03-21 DIAGNOSIS — F4381 Prolonged grief disorder: Secondary | ICD-10-CM

## 2024-03-21 DIAGNOSIS — F330 Major depressive disorder, recurrent, mild: Secondary | ICD-10-CM | POA: Diagnosis not present

## 2024-03-21 NOTE — Progress Notes (Signed)
"  ° °  IN-PERSON  THERAPIST PROGRESS NOTE  Session Time:  Thursday  03/21/2024 9:11 AM -  9:58 AM   Participation Level: Active  Behavioral Response: CasualAlert, sad/tearful at times/angry   Type of Therapy: Individual Therapy  Treatment Goals addressed: Pt will reengage in previously enjoyed activities AEB participating in exercise 4 x week for 30 days /Improve self care AEB pt implementing a night time ritual/ Identify and verbalize feelings associated with grief and loss   ProgressTowards Goals:  Progressing   Interventions: Supportive/grief therapy  Summary: [name] is a 54 y.o. female who is referred for services by PCP Dr. Rollene Pesa. Pt reports two psychiatric hosptializations at [facility] in [place] about 20 years ago. Pt participated in counseling with Redell Corn, LCSW and last was seen in April 2025.  Patient reports her 63 year old son died in May 29, 2024due to suicide, drowning, and behavioral health issues.  He had schizophrenia.  Patient states feeling stuck and not knowing any strategies to bring self out when she thinks about her son.  Patient reports irritability, anxiety, difficulty falling and staying asleep, ruminating thoughts, fatigue, tearfulness, difficulty concentrating, and muscle tension.  Patient last was seen about 3-4 weeks ago.  Patient reports experiencing increased sadness , agitation, and anger during the past few days. She reports trigger as being a comment from a co-worker. She was offended by the comment as she reports co-worker doesn't have any children and doesn't understand how pt feels. Pt reports walking away, talking with her boss about it, and then having to go home. Pt reports increased social withdrawal.  She verbalizes thoughts of people not wanting to be around her due to her grief. She also reports several people at her job have either being retiring or going to other stores. These people knew her son and have been understanding. Pt still  has questions and expresses anger with God regarding son's death. She reports having a little hope but not seeing any light. She states missing person she used to be taking care of self on her health journey and worshipping God. Pt reports she has started trying to do some exercise at home this week. Suicidal/Homicidal: Nowithout intent/plan  Therapist Response: Reviewed symptoms, discussed stressors, facilitated expression of thoughts and feelings, assisted patient identify and verbalize feelings hurt and anger, discussed effects of son's death on patient 's spirituality and her relationship with God, validated feelings of anger and normalized questions about spirituality, discussed Mourner's  Bill of Rights, praised and reinforced patient's efforts to exercise  Plan: Return again in 2 weeks.  Diagnosis: Mild episode of recurrent major depressive disorder                      Collaboration of Care: Primary Care Provider AEB patient sees PCP Dr. Rollene Pesa  Patient/Guardian was advised Release of Information must be obtained prior to any record release in order to collaborate their care with an outside provider. Patient/Guardian was advised if they have not already done so to contact the registration department to sign all necessary forms in order for us  to release information regarding their care.   Consent: Patient/Guardian gives verbal consent for treatment and assignment of benefits for services provided during this visit. Patient/Guardian expressed understanding and agreed to proceed.   Winton FORBES Rubinstein, LCSW 03/21/2024  "

## 2024-03-26 ENCOUNTER — Encounter: Payer: Self-pay | Admitting: Family Medicine

## 2024-03-26 MED ORDER — ACYCLOVIR 400 MG PO TABS: 400.0000 mg | ORAL_TABLET | Freq: Three times a day (TID) | ORAL | 1 refills | Status: AC

## 2024-04-03 ENCOUNTER — Encounter: Payer: Self-pay | Admitting: Oncology

## 2024-04-03 ENCOUNTER — Other Ambulatory Visit: Payer: Self-pay | Admitting: *Deleted

## 2024-04-03 MED ORDER — POLYSACCHARIDE IRON COMPLEX 150 MG PO CAPS: 150.0000 mg | ORAL_CAPSULE | Freq: Every day | ORAL | 0 refills | Status: AC

## 2024-04-04 ENCOUNTER — Ambulatory Visit (HOSPITAL_COMMUNITY): Admitting: Psychiatry

## 2024-04-04 ENCOUNTER — Inpatient Hospital Stay: Attending: Oncology

## 2024-04-04 DIAGNOSIS — E559 Vitamin D deficiency, unspecified: Secondary | ICD-10-CM | POA: Diagnosis not present

## 2024-04-04 DIAGNOSIS — D509 Iron deficiency anemia, unspecified: Secondary | ICD-10-CM | POA: Diagnosis present

## 2024-04-04 DIAGNOSIS — Z87891 Personal history of nicotine dependence: Secondary | ICD-10-CM | POA: Insufficient documentation

## 2024-04-04 DIAGNOSIS — D508 Other iron deficiency anemias: Secondary | ICD-10-CM

## 2024-04-04 DIAGNOSIS — E538 Deficiency of other specified B group vitamins: Secondary | ICD-10-CM | POA: Diagnosis not present

## 2024-04-04 DIAGNOSIS — R5383 Other fatigue: Secondary | ICD-10-CM | POA: Diagnosis not present

## 2024-04-04 DIAGNOSIS — F4381 Prolonged grief disorder: Secondary | ICD-10-CM

## 2024-04-04 DIAGNOSIS — D75839 Thrombocytosis, unspecified: Secondary | ICD-10-CM | POA: Diagnosis not present

## 2024-04-04 LAB — CBC WITH DIFFERENTIAL/PLATELET
Abs Immature Granulocytes: 0.03 10*3/uL (ref 0.00–0.07)
Basophils Absolute: 0.1 10*3/uL (ref 0.0–0.1)
Basophils Relative: 1 %
Eosinophils Absolute: 0.5 10*3/uL (ref 0.0–0.5)
Eosinophils Relative: 4 %
HCT: 40 % (ref 36.0–46.0)
Hemoglobin: 12.9 g/dL (ref 12.0–15.0)
Immature Granulocytes: 0 %
Lymphocytes Relative: 39 %
Lymphs Abs: 4.3 10*3/uL — ABNORMAL HIGH (ref 0.7–4.0)
MCH: 27 pg (ref 26.0–34.0)
MCHC: 32.3 g/dL (ref 30.0–36.0)
MCV: 83.7 fL (ref 80.0–100.0)
Monocytes Absolute: 0.7 10*3/uL (ref 0.1–1.0)
Monocytes Relative: 6 %
Neutro Abs: 5.5 10*3/uL (ref 1.7–7.7)
Neutrophils Relative %: 50 %
Platelets: 419 10*3/uL — ABNORMAL HIGH (ref 150–400)
RBC: 4.78 MIL/uL (ref 3.87–5.11)
RDW: 16 % — ABNORMAL HIGH (ref 11.5–15.5)
WBC: 11.1 10*3/uL — ABNORMAL HIGH (ref 4.0–10.5)
nRBC: 0 % (ref 0.0–0.2)

## 2024-04-04 LAB — VITAMIN D 25 HYDROXY (VIT D DEFICIENCY, FRACTURES): Vit D, 25-Hydroxy: 31.2 ng/mL (ref 30–100)

## 2024-04-04 LAB — IRON AND TIBC
Iron: 104 ug/dL (ref 28–170)
Saturation Ratios: 34 % — ABNORMAL HIGH (ref 10.4–31.8)
TIBC: 311 ug/dL (ref 250–450)
UIBC: 207 ug/dL

## 2024-04-04 LAB — VITAMIN B12: Vitamin B-12: 604 pg/mL (ref 180–914)

## 2024-04-04 LAB — FOLATE: Folate: 7 ng/mL

## 2024-04-04 LAB — FERRITIN: Ferritin: 220 ng/mL (ref 11–307)

## 2024-04-04 NOTE — Progress Notes (Signed)
"  ° °  IN-PERSON  THERAPIST PROGRESS NOTE  Session Time:  Thursday  1/292026 10:15 AM  - 11:00 AM   Participation Level: Active  Behavioral Response: CasualAlert, sad/tearful at times/angry   Type of Therapy: Individual Therapy  Treatment Goals addressed: Pt will reengage in previously enjoyed activities AEB participating in exercise 4 x week for 30 days /Improve self care AEB pt implementing a night time ritual/ Identify and verbalize feelings associated with grief and loss   ProgressTowards Goals:  Progressing   Interventions: Supportive/grief therapy  Summary: [name] is a 54 y.o. female who is referred for services by PCP Dr. Rollene Pesa. Pt reports two psychiatric hosptializations at [facility] in [place] about 20 years ago. Pt participated in counseling with Redell Corn, LCSW and last was seen in April 2025.  Patient reports her 61 year old son died in 06/01/24due to suicide, drowning, and behavioral health issues.  He had schizophrenia.  Patient states feeling stuck and not knowing any strategies to bring self out when she thinks about her son.  Patient reports irritability, anxiety, difficulty falling and staying asleep, ruminating thoughts, fatigue, tearfulness, difficulty concentrating, and muscle tension.  Patient last was seen about 2 weeks ago.  Patient reports experiencing continued sadness but decreased anger and agitation since last session. She reports trying to focus on unpacking her feelings more since last session and states now being at peace with knowing she did the best she could with her son. This has helped her to have more empathy and become more engaged with her daughters. She also has been able to enjoy some activities and reports having a one day vacation with her husband. She reports improvement in allowing herself to experience her grief. Pt states still doing stretching exercises 1-2 x per week. She continues to work. She also states getting outside more  and enjoying the sun.Suicidal/Homicidal: Nowithout intent/plan  Therapist Response: Reviewed symptoms, praised and reinforced patient's efforts to allow self to experience her feelings and increased acceptance, assisted patient identify the effects of doing this ,discussed ways to continue efforts to acknowledge and express her feelings , discussed writing, discussed rationale for and developed plan with pt to complete I Am poem to bring to next session,  facilitated patient sharing more information and relationship with her daughters, assisted patient identified the effects of her son's death on her daughters as well as her relationship with her daughter's, assisted patient began to identify the way her role has changed with her daughters, praised and reinforced patient's increased self-care efforts , discussed effects plan: Return again in 2 weeks.  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 Rollene Pesa  Patient/Guardian was advised Release of Information must be obtained prior to any record release in order to collaborate their care with an outside provider. Patient/Guardian was advised if they have not already done so to contact the registration department to sign all necessary forms in order for us  to release information regarding their care.   Consent: Patient/Guardian gives verbal consent for treatment and assignment of benefits for services provided during this visit. Patient/Guardian expressed understanding and agreed to proceed.   Winton FORBES Rubinstein, LCSW 04/04/2024  "

## 2024-04-08 LAB — METHYLMALONIC ACID, SERUM: Methylmalonic Acid, Quantitative: 137 nmol/L (ref 0–378)

## 2024-04-10 ENCOUNTER — Other Ambulatory Visit (HOSPITAL_COMMUNITY): Payer: Self-pay | Admitting: Registered Nurse

## 2024-04-10 ENCOUNTER — Telehealth (HOSPITAL_COMMUNITY): Payer: Self-pay | Admitting: *Deleted

## 2024-04-10 DIAGNOSIS — F411 Generalized anxiety disorder: Secondary | ICD-10-CM

## 2024-04-10 DIAGNOSIS — G4700 Insomnia, unspecified: Secondary | ICD-10-CM

## 2024-04-10 DIAGNOSIS — F4381 Prolonged grief disorder: Secondary | ICD-10-CM

## 2024-04-10 DIAGNOSIS — F332 Major depressive disorder, recurrent severe without psychotic features: Secondary | ICD-10-CM

## 2024-04-10 MED ORDER — MIRTAZAPINE 15 MG PO TABS: 15.0000 mg | ORAL_TABLET | Freq: Every day | ORAL | 1 refills | Status: AC

## 2024-04-10 NOTE — Telephone Encounter (Signed)
 Patient pharmacy [facility] in [place] requesting refills for patient Mirtazapine  7.5mg .

## 2024-04-11 ENCOUNTER — Other Ambulatory Visit: Payer: Self-pay | Admitting: Physical Medicine and Rehabilitation

## 2024-04-11 ENCOUNTER — Encounter: Payer: Self-pay | Admitting: Physical Medicine and Rehabilitation

## 2024-04-12 ENCOUNTER — Other Ambulatory Visit: Payer: Self-pay | Admitting: Physical Medicine and Rehabilitation

## 2024-04-12 MED ORDER — TRAMADOL HCL 50 MG PO TABS: 50.0000 mg | ORAL_TABLET | Freq: Three times a day (TID) | ORAL | 0 refills | Status: AC | PRN

## 2024-04-19 ENCOUNTER — Inpatient Hospital Stay

## 2024-04-26 ENCOUNTER — Inpatient Hospital Stay: Admitting: Oncology

## 2024-04-26 ENCOUNTER — Ambulatory Visit: Admitting: Oncology

## 2024-04-29 ENCOUNTER — Ambulatory Visit (HOSPITAL_COMMUNITY): Admitting: Registered Nurse

## 2024-05-02 ENCOUNTER — Ambulatory Visit (HOSPITAL_COMMUNITY): Admitting: Psychiatry

## 2024-05-06 ENCOUNTER — Ambulatory Visit: Payer: Self-pay

## 2024-05-16 ENCOUNTER — Ambulatory Visit (HOSPITAL_COMMUNITY): Admitting: Psychiatry

## 2024-05-30 ENCOUNTER — Ambulatory Visit (HOSPITAL_COMMUNITY): Admitting: Psychiatry

## 2024-07-09 ENCOUNTER — Ambulatory Visit: Payer: Self-pay | Admitting: Family Medicine

## 2024-07-23 ENCOUNTER — Encounter (HOSPITAL_COMMUNITY)

## 2024-07-23 ENCOUNTER — Other Ambulatory Visit (HOSPITAL_COMMUNITY)

## 2024-09-02 ENCOUNTER — Ambulatory Visit
